# Patient Record
Sex: Female | Born: 1945 | Race: White | Hispanic: No | Smoking: Current every day smoker
Health system: Southern US, Community
[De-identification: ages and names within clinical notes are randomized; demographics above are authoritative.]

## PROBLEM LIST (undated history)

## (undated) DIAGNOSIS — K219 Gastro-esophageal reflux disease without esophagitis: Secondary | ICD-10-CM

## (undated) DIAGNOSIS — C069 Malignant neoplasm of mouth, unspecified: Secondary | ICD-10-CM

## (undated) DIAGNOSIS — J189 Pneumonia, unspecified organism: Secondary | ICD-10-CM

## (undated) DIAGNOSIS — I729 Aneurysm of unspecified site: Secondary | ICD-10-CM

## (undated) DIAGNOSIS — Z8719 Personal history of other diseases of the digestive system: Secondary | ICD-10-CM

## (undated) DIAGNOSIS — I739 Peripheral vascular disease, unspecified: Secondary | ICD-10-CM

## (undated) DIAGNOSIS — I771 Stricture of artery: Secondary | ICD-10-CM

## (undated) DIAGNOSIS — D649 Anemia, unspecified: Secondary | ICD-10-CM

## (undated) DIAGNOSIS — H3530 Unspecified macular degeneration: Secondary | ICD-10-CM

## (undated) DIAGNOSIS — I774 Celiac artery compression syndrome: Secondary | ICD-10-CM

## (undated) DIAGNOSIS — G629 Polyneuropathy, unspecified: Secondary | ICD-10-CM

## (undated) DIAGNOSIS — M1990 Unspecified osteoarthritis, unspecified site: Secondary | ICD-10-CM

## (undated) DIAGNOSIS — I6529 Occlusion and stenosis of unspecified carotid artery: Secondary | ICD-10-CM

## (undated) DIAGNOSIS — J45909 Unspecified asthma, uncomplicated: Secondary | ICD-10-CM

## (undated) DIAGNOSIS — G4730 Sleep apnea, unspecified: Secondary | ICD-10-CM

## (undated) DIAGNOSIS — L570 Actinic keratosis: Secondary | ICD-10-CM

## (undated) DIAGNOSIS — F32A Depression, unspecified: Secondary | ICD-10-CM

## (undated) HISTORY — DX: Peripheral vascular disease, unspecified: I73.9

## (undated) HISTORY — DX: Unspecified asthma, uncomplicated: J45.909

## (undated) HISTORY — PX: COLONOSCOPY: SHX174

## (undated) HISTORY — DX: Actinic keratosis: L57.0

## (undated) HISTORY — DX: Sleep apnea, unspecified: G47.30

## (undated) HISTORY — DX: Unspecified macular degeneration: H35.30

## (undated) HISTORY — DX: Occlusion and stenosis of unspecified carotid artery: I65.29

## (undated) HISTORY — PX: CELIAC ARTERY STENT: SHX1319

## (undated) HISTORY — PX: APPENDECTOMY: SHX54

---

## 2001-11-09 HISTORY — PX: PAROTID GLAND TUMOR EXCISION: SHX5221

## 2015-11-10 HISTORY — PX: EYE SURGERY: SHX253

## 2015-11-12 ENCOUNTER — Other Ambulatory Visit: Payer: Self-pay | Admitting: Internal Medicine

## 2015-11-12 DIAGNOSIS — E2839 Other primary ovarian failure: Secondary | ICD-10-CM

## 2015-11-12 DIAGNOSIS — Z780 Asymptomatic menopausal state: Secondary | ICD-10-CM

## 2015-12-17 ENCOUNTER — Other Ambulatory Visit: Payer: Self-pay | Admitting: Internal Medicine

## 2015-12-17 DIAGNOSIS — Z1231 Encounter for screening mammogram for malignant neoplasm of breast: Secondary | ICD-10-CM

## 2016-01-23 ENCOUNTER — Ambulatory Visit
Admission: RE | Admit: 2016-01-23 | Discharge: 2016-01-23 | Disposition: A | Discharge status: Home / Self Care | Payer: Medicare Other | Source: Ambulatory Visit | Attending: Internal Medicine | Admitting: Internal Medicine

## 2016-01-23 ENCOUNTER — Ambulatory Visit: Payer: Self-pay

## 2016-01-23 DIAGNOSIS — E2839 Other primary ovarian failure: Secondary | ICD-10-CM

## 2016-01-23 DIAGNOSIS — Z1231 Encounter for screening mammogram for malignant neoplasm of breast: Secondary | ICD-10-CM

## 2016-08-10 ENCOUNTER — Other Ambulatory Visit: Payer: Self-pay | Admitting: Internal Medicine

## 2016-08-10 DIAGNOSIS — N6310 Unspecified lump in the right breast, unspecified quadrant: Secondary | ICD-10-CM

## 2016-08-10 DIAGNOSIS — N63 Unspecified lump in unspecified breast: Secondary | ICD-10-CM

## 2016-08-12 ENCOUNTER — Ambulatory Visit
Admission: RE | Admit: 2016-08-12 | Discharge: 2016-08-12 | Disposition: A | Discharge status: Home / Self Care | Payer: Medicare Other | Source: Ambulatory Visit | Attending: Internal Medicine | Admitting: Internal Medicine

## 2016-08-12 DIAGNOSIS — N6310 Unspecified lump in the right breast, unspecified quadrant: Secondary | ICD-10-CM

## 2016-10-09 HISTORY — PX: CHOLECYSTECTOMY: SHX55

## 2016-10-09 HISTORY — PX: APPENDECTOMY: SHX54

## 2017-06-07 ENCOUNTER — Other Ambulatory Visit: Payer: Self-pay | Admitting: Internal Medicine

## 2017-06-07 DIAGNOSIS — Z1231 Encounter for screening mammogram for malignant neoplasm of breast: Secondary | ICD-10-CM

## 2017-06-28 ENCOUNTER — Ambulatory Visit
Admission: RE | Admit: 2017-06-28 | Discharge: 2017-06-28 | Disposition: A | Discharge status: Home / Self Care | Payer: Medicare Other | Source: Ambulatory Visit | Attending: Internal Medicine | Admitting: Internal Medicine

## 2017-06-28 DIAGNOSIS — Z1231 Encounter for screening mammogram for malignant neoplasm of breast: Secondary | ICD-10-CM

## 2017-09-14 ENCOUNTER — Ambulatory Visit (INDEPENDENT_AMBULATORY_CARE_PROVIDER_SITE_OTHER): Payer: Self-pay | Admitting: Orthopedic Surgery

## 2017-09-14 ENCOUNTER — Telehealth (INDEPENDENT_AMBULATORY_CARE_PROVIDER_SITE_OTHER): Payer: Self-pay

## 2017-09-14 ENCOUNTER — Ambulatory Visit (INDEPENDENT_AMBULATORY_CARE_PROVIDER_SITE_OTHER): Payer: Medicare Other

## 2017-09-14 ENCOUNTER — Other Ambulatory Visit (INDEPENDENT_AMBULATORY_CARE_PROVIDER_SITE_OTHER): Payer: Self-pay

## 2017-09-14 ENCOUNTER — Ambulatory Visit (INDEPENDENT_AMBULATORY_CARE_PROVIDER_SITE_OTHER): Payer: Medicare Other | Admitting: Orthopedic Surgery

## 2017-09-14 ENCOUNTER — Encounter (INDEPENDENT_AMBULATORY_CARE_PROVIDER_SITE_OTHER): Payer: Self-pay | Admitting: Orthopedic Surgery

## 2017-09-14 DIAGNOSIS — M25562 Pain in left knee: Secondary | ICD-10-CM | POA: Diagnosis not present

## 2017-09-14 DIAGNOSIS — G8929 Other chronic pain: Secondary | ICD-10-CM

## 2017-09-14 DIAGNOSIS — M25572 Pain in left ankle and joints of left foot: Secondary | ICD-10-CM

## 2017-09-14 DIAGNOSIS — M545 Low back pain, unspecified: Secondary | ICD-10-CM

## 2017-09-14 DIAGNOSIS — M4807 Spinal stenosis, lumbosacral region: Secondary | ICD-10-CM

## 2017-09-14 MED ORDER — DIAZEPAM 5 MG PO TABS: ORAL_TABLET | ORAL | 0 refills | Status: DC

## 2017-09-14 NOTE — Telephone Encounter (Signed)
Patient wanted OPEN MRI, sorry I didn't state that in the order, she is claustro, I called her in some Valium too

## 2017-09-14 NOTE — Progress Notes (Signed)
Office Visit Note   Patient: Alicia [name]           Date of Birth: 04-19-1946           MRN: [id number] Visit Date: 09/14/2017 Requested by: No referring provider defined for this encounter. PCP: Doretha Sou, MD  Subjective: Chief Complaint  Patient presents with  . Left Knee - Pain  . Lower Back - Pain  . Left Ankle - Pain    HPI: Alicia [name] is a 71 year old patient with multiple orthopedic complaints today.  She has a long and complicated past medical and surgical history.  She was noted to have previous left navicular fracture years ago treated in [place] without surgery.  She reports ambulatory left ankle and foot pain at this time which is not unexpected.  She takes [facility] and Tylenol for her symptoms.  Patient also describes back pain.  She's had back pain for a long time.  Had lumbar spine MRI which showed stenosis and scoliosis in 2013.  She had some type of denervation procedure which did not help.  She states currently she has back pain which runs down the left greater than right leg when she is sitting.  She does use a cane.  Walking endurance is 20 minutes and stair standing endurance is 10 minutes.  This is affecting her sleep.  Patient does physical therapy at home.  She reports left knee pain and had an injection in the left knee 8 years ago.  Denies any history of injury mechanical symptoms locking or instability in the left knee              ROS: All systems reviewed are negative as they relate to the chief complaint within the history of present illness.  Patient denies  fevers or chills.   Assessment & Plan: Visit Diagnoses:  1. Chronic pain of left knee   2. Lumbar pain   3. Pain in left ankle and joints of left foot     Plan: Impression is severe scoliosis and degenerative disc disease in the lumbar spine.  This is her biggest issue.  I don't think that she is really up for any type of major surgical intervention.  She is doing physical  therapy and taking medication.  No real room for improvement there.  In regards to injections I think it would be reasonable to consider having Dr. Ernestina Patches evaluate her and inject her back without doing the denervation procedure.  If not that she just will simply have to live with what she has.  To that and I have requested open MRI scan of the lumbar spine with follow-up with Dr. Ernestina Patches.  In regards to the left ankle this is something she can either live with her consider fusion 4.  I would refer to Dr. Sharol Given for foot fusion if necessary.  In regards to the left knee radiographs and exam are normal.  No intervention required on that at this time  Follow-Up Instructions: No Follow-up on file.   Orders:  Orders Placed This Encounter  Procedures  . XR Knee 1-2 Views Left  . XR Ankle Complete Left  . XR Lumbar Spine 2-3 Views   No orders of the defined types were placed in this encounter.     Procedures: No procedures performed   Clinical Data: No additional findings.  Objective: Vital Signs: There were no vitals taken for this visit.  Physical Exam:   Constitutional: Patient appears well-developed HEENT:  Head: Normocephalic Eyes:EOM  are normal Neck: Normal range of motion Cardiovascular: Normal rate Pulmonary/chest: Effort normal Neurologic: Patient is alert Skin: Skin is warm Psychiatric: Patient has normal mood and affect    Ortho Exam: Orthopedic exam demonstrates left knee full range of motion stable collateral crucial in is no effusion no groin pain with internal/external rotation of the leg.  Left ankle exam demonstrates palpable pedal pulses some pain tenderness and deformity along the midfoot region just distal to the joint line.  Posterior tib and 2 tib peroneal and Achilles tendons palpable nontender and intact.  There is diminished subtalar and transverse tarsal range of motion.  Tibiotalar range of motion intact but somewhat limited with ankle dorsiflexion due to  the bony deformity.  Patient has no nerve retention signs no groin pain with internal rotation leg palpable pedal pulses symmetric reflexes negative in sitting negative clonus no definite paresthesias L1 S1 bilaterally some pain with forward lateral bending and general stiffness in the lumbar spine.  Specialty Comments:  No specialty comments available.  Imaging: Xr Ankle Complete Left  Result Date: 09/14/2017 AP lateral mortise left ankle reviewed.  Ankle mortise is symmetric but severe and deforming talonavicular arthritis is present.  No acute fracture noted.  No other fractures present.  Xr Knee 1-2 Views Left  Result Date: 09/14/2017 AP lateral left knee reviewed.  No arthritis is present in the patellofemoral needle or lateral joint spaces.  No fracture dislocation or effusion present.  Normal left knee  Xr Lumbar Spine 2-3 Views  Result Date: 09/14/2017 AP lateral lumbar spine reviewed significant scoliosis is present.  Significant degenerative disc disease at multiple levels including L4-5 and L5-S1 is present.  Spurring is noted on the lateral aspect of the vertebral bodies.  Facet arthritis is present.  Hip joints appear normal.  Sacroiliac joints also appear normal.    PMFS History: There are no active problems to display for this patient.  No past medical history on file.  No family history on file.  No past surgical history on file. Social History   Occupational History  . Not on file  Tobacco Use  . Smoking status: Current Every Day Smoker  . Smokeless tobacco: Never Used  Substance and Sexual Activity  . Alcohol use: Not on file  . Drug use: Not on file  . Sexual activity: Not on file

## 2017-09-14 NOTE — Telephone Encounter (Signed)
I have put in an order for patient to have MRI on her back, Dr. Marlou Sa wanted her to followup with [facility] after this MRI Wasn't 100% sure how to go about this

## 2017-09-15 NOTE — Telephone Encounter (Signed)
Noted and I put in referral notes as a reminder

## 2017-09-20 NOTE — Telephone Encounter (Signed)
Please advise. Looks like MRI is scheduled for 11/27. Should I schedule for an OV after that or schedule for injection pending MRI results? No blood thinners in chart; Medicare.

## 2017-09-20 NOTE — Telephone Encounter (Signed)
OV

## 2017-09-23 NOTE — Telephone Encounter (Signed)
Scheduled for an ov 10/06/17.

## 2017-10-05 ENCOUNTER — Ambulatory Visit
Admission: RE | Admit: 2017-10-05 | Discharge: 2017-10-05 | Disposition: A | Discharge status: Home / Self Care | Payer: Medicare Other | Source: Ambulatory Visit | Attending: Orthopedic Surgery | Admitting: Orthopedic Surgery

## 2017-10-05 DIAGNOSIS — M4807 Spinal stenosis, lumbosacral region: Secondary | ICD-10-CM

## 2017-10-06 ENCOUNTER — Encounter (INDEPENDENT_AMBULATORY_CARE_PROVIDER_SITE_OTHER): Payer: Self-pay | Admitting: Physical Medicine and Rehabilitation

## 2017-10-06 ENCOUNTER — Ambulatory Visit (INDEPENDENT_AMBULATORY_CARE_PROVIDER_SITE_OTHER): Payer: Medicare Other | Admitting: Physical Medicine and Rehabilitation

## 2017-10-06 VITALS — BP 148/78 | HR 53

## 2017-10-06 DIAGNOSIS — M5416 Radiculopathy, lumbar region: Secondary | ICD-10-CM

## 2017-10-06 DIAGNOSIS — M48062 Spinal stenosis, lumbar region with neurogenic claudication: Secondary | ICD-10-CM | POA: Diagnosis not present

## 2017-10-06 DIAGNOSIS — M5116 Intervertebral disc disorders with radiculopathy, lumbar region: Secondary | ICD-10-CM | POA: Diagnosis not present

## 2017-10-06 DIAGNOSIS — M419 Scoliosis, unspecified: Secondary | ICD-10-CM

## 2017-10-06 NOTE — Progress Notes (Signed)
Alicia [name] - 71 y.o. female MRN [id number]  Date of birth: May 22, 1946  Office Visit Note: Visit Date: 10/06/2017 PCP: Alicia Sou, MD Referred by: Alicia [name]*  Subjective: Chief Complaint  Patient presents with  . Lower Back - Pain  . Left Hip - Pain  . Left Leg - Pain   HPI: [name] is a 71 year old issues from an orthopedic standpoint.  She reports years of chronic back pain.  Most recently she has had worsening left hip and leg pain particularly with sitting and standing.  She reports this goes down to the posterior lateral leg and the ankle area.  She is not noted any focal weakness or foot drop.  She said no specific trauma.  She has been followed by Dr. Marlou Sa in the office who did order an MRI of her lumbar spine.  This is reviewed below but basically shows mild scoliosis with significant facet arthropathy particularly on the left at L4-5 creating some lateral recess narrowing along with a disc protrusion and broad-based bulge.  This could affect the L5 nerve root in the lateral recess or in the foramen.  She reports no real right-sided leg pain.  No groin pain.  She has had medications and physical therapy in the past.  She was treated by a pain management physician of some sort in [facility] and she says an attempted procedure was done to get rid of the nerves in the lower back.  She reports that this is the most painful injection or procedure she is ever had and she will never go through that again.  When asked about some of the injections or other treatments provided she is really unsure what the physician did.  We unfortunately do not have his notes for review.  He is currently taking an anti-inflammatory without relief.  She is able to walk prolonged sitting as well.    Review of Systems  Constitutional: Negative for chills, fever, malaise/fatigue and weight loss.  HENT: Negative for hearing loss and sinus pain.   Eyes:  Negative for blurred vision, double vision and photophobia.  Respiratory: Negative for cough and shortness of breath.   Cardiovascular: Negative for chest pain, palpitations and leg swelling.  Gastrointestinal: Negative for abdominal pain, nausea and vomiting.  Genitourinary: Negative for flank pain.  Musculoskeletal: Positive for back pain and joint pain. Negative for myalgias.  Skin: Negative for itching and rash.  Neurological: Negative for tremors, focal weakness and weakness.  Endo/Heme/Allergies: Negative.   Psychiatric/Behavioral: Negative for depression.  All other systems reviewed and are negative.  Otherwise per HPI.  Assessment & Plan: Visit Diagnoses: No diagnosis found.  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  She has sort of mixed symptoms that could be disc related nerve it sounds like the attempt at radiofrequency ablation was obviously unsuccessful and caused her a lot of stress and how painful it was.  I did try to explain to her that we do that same procedure in the office there could be parts of it that are touchy but usually the patients do fairly well.  The best approach though since she is having radicular pain is to try an epidural injection.  She is not sure that she had an epidural at all with the other physician in [place].  She is  not on any anticoagulation.  I would start with an L4-5 interlaminar injection.  Would look at facet joint block in the future.    Meds & Orders: No orders of the defined types were placed in this encounter.  No orders of the defined types were placed in this encounter.   Follow-up: Return for Left L4-5 interlaminar epidural steroid injection.   Procedures: No procedures performed  No notes on file   Clinical History: MRI LUMBAR  SPINE WITHOUT CONTRAST  TECHNIQUE: Multiplanar, multisequence MR imaging of the lumbar spine was performed. No intravenous contrast was administered.  COMPARISON:  09/14/2017 lumbar spine radiographs.  FINDINGS: Segmentation:  Standard.  Alignment: Mild lumbar levocurvature with apex at L3. Straightening of lumbar lordosis. No listhesis.  Vertebrae: Mild multilevel endplate degenerative edema at the L2-3 through L5-S1 levels. No evidence of discitis or fracture. 13 mm intermediate signal focus in L3 vertebral body. Subcentimeter T1/T2 hyperintense focus in L1 vertebral body, likely hemangioma.  Conus medullaris and cauda equina: Conus extends to the L1 level. Conus and cauda equina appear normal.  Paraspinal and other soft tissues: Negative.  Disc levels:  L1-2: Minimal disc bulge. No significant foraminal or canal stenosis.  L2-3: Diffuse disc bulge, endplate marginal osteophytes, and right greater than left facet/ligamentum flavum hypertrophy. Moderate right and mild left foraminal stenosis. No significant canal stenosis.  L3-4: Diffuse disc bulge, small central annular fissure, moderate bilateral facet and mild ligamentum flavum hypertrophy. Mild bilateral foraminal stenosis. No significant canal stenosis.  L4-5: Disc bulge eccentric to the left with left foraminal and extraforaminal prominent marginal osteophytes, mild right/ severe left facet hypertrophy, and left ligamentum flavum hypertrophy. Severe left foraminal stenosis. Left lateral recess narrowing with disc contact on descending left L5 nerve root.  L5-S1: Small disc bulge, bilateral foraminal and extraforaminal marginal osteophytes, and advanced facet hypertrophy. Severe bilateral foraminal stenosis. No significant canal stenosis.  IMPRESSION: 1. No acute osseous abnormality.  Mild lumbar levocurvature. 2. Indeterminate 13 mm L3 vertebral body intermediate signal focus. In the absence of  primary malignancy this probably represents red marrow. If there is clinical concern for metastatic disease, consider lumbar spine MRI with contrast or bone scan for further assessment. 3. No significant canal stenosis. 4. Multilevel mild and moderate foraminal stenosis. Severe left L4-5 and bilateral L5-S1 foraminal stenosis.   Electronically Signed   By: Kristine Garbe M.D.   On: 10/05/2017 13:51  She reports that she has been smoking.  she has never used smokeless tobacco. No results for input(s): HGBA1C, LABURIC in the last 8760 hours.  Objective:  VS:  HT:    WT:   BMI:     BP:(!) 148/78  HR:(!) 53bpm  TEMP: ( )  RESP:  Physical Exam  Constitutional: She is oriented to person, place, and time. She appears well-developed and well-nourished.  Eyes: Conjunctivae and EOM are normal. Pupils are equal, round, and reactive to light.  Cardiovascular: Normal rate and intact distal pulses.  Pulmonary/Chest: Effort normal.  Musculoskeletal:  Patient is slow to rise from a seated position and does have concordant low back pain with extension rotation to the left.  She has no pain over the greater trochanters and no pain with hip rotation.  She has good distal strength without clonus.  She has a negative slump test.  Neurological: She is alert and oriented to person, place, and time. She exhibits normal muscle tone.  Skin: Skin is warm and dry. No rash noted. No erythema.  Psychiatric: She has  a normal mood and affect. Her behavior is normal.  Nursing note and vitals reviewed.   Ortho Exam Imaging: No results found.  Past Medical/Family/Surgical/Social History: Medications & Allergies reviewed per EMR There are no active problems to display for this patient.  History reviewed. No pertinent past medical history. History reviewed. No pertinent family history. History reviewed. No pertinent surgical history. Social History   Occupational History  . Not on file    Tobacco Use  . Smoking status: Current Every Day Smoker  . Smokeless tobacco: Never Used  Substance and Sexual Activity  . Alcohol use: Not on file  . Drug use: Not on file  . Sexual activity: Not on file

## 2017-10-06 NOTE — Progress Notes (Deleted)
Lower back pain. Had denervation in 2013. She said this did not work and she will never have that again because it hurt so much. Pain is on both sides- starts at waist, and radiates down to back of knee- left leg is worse.

## 2017-10-14 ENCOUNTER — Encounter (INDEPENDENT_AMBULATORY_CARE_PROVIDER_SITE_OTHER): Payer: Self-pay | Admitting: Physical Medicine and Rehabilitation

## 2017-10-18 ENCOUNTER — Encounter (INDEPENDENT_AMBULATORY_CARE_PROVIDER_SITE_OTHER): Payer: Medicare Other | Admitting: Physical Medicine and Rehabilitation

## 2017-10-26 ENCOUNTER — Ambulatory Visit (INDEPENDENT_AMBULATORY_CARE_PROVIDER_SITE_OTHER): Payer: Medicare Other | Admitting: Physical Medicine and Rehabilitation

## 2017-10-26 ENCOUNTER — Encounter (INDEPENDENT_AMBULATORY_CARE_PROVIDER_SITE_OTHER): Payer: Self-pay | Admitting: Physical Medicine and Rehabilitation

## 2017-10-26 ENCOUNTER — Ambulatory Visit (INDEPENDENT_AMBULATORY_CARE_PROVIDER_SITE_OTHER): Payer: Medicare Other

## 2017-10-26 VITALS — BP 139/76 | HR 52 | Temp 98.2°F

## 2017-10-26 DIAGNOSIS — M5116 Intervertebral disc disorders with radiculopathy, lumbar region: Secondary | ICD-10-CM

## 2017-10-26 DIAGNOSIS — M48062 Spinal stenosis, lumbar region with neurogenic claudication: Secondary | ICD-10-CM | POA: Diagnosis not present

## 2017-10-26 DIAGNOSIS — M5416 Radiculopathy, lumbar region: Secondary | ICD-10-CM

## 2017-10-26 MED ORDER — METHYLPREDNISOLONE ACETATE 80 MG/ML IJ SUSP
80.0000 mg | Freq: Once | INTRAMUSCULAR | Status: AC
  Administered 2017-10-26: 80 mg

## 2017-10-26 MED ORDER — LIDOCAINE HCL (PF) 1 % IJ SOLN
2.0000 mL | Freq: Once | INTRAMUSCULAR | Status: AC
  Administered 2017-10-26: 2 mL

## 2017-10-26 NOTE — Patient Instructions (Signed)

## 2017-11-10 ENCOUNTER — Telehealth (INDEPENDENT_AMBULATORY_CARE_PROVIDER_SITE_OTHER): Payer: Self-pay | Admitting: Physical Medicine and Rehabilitation

## 2017-11-10 NOTE — Procedures (Signed)
[name] is a very pleasant but anxious 72 year old female comes in today for planned L4-5 interlaminar epidural steroid injection for moderate stenosis in the low back and hip pain.  Please see our prior evaluation and management note for further details and justification.  Lumbar Epidural Steroid Injection - Interlaminar Approach with Fluoroscopic Guidance  Patient: Alicia [name]      Date of Birth: [birth year] MRN: [id number] PCP: Doretha Sou, MD      Visit Date: 10/26/2017   Universal Protocol:     Consent Given By: the patient  Position: PRONE  Additional Comments: Vital signs were monitored before and after the procedure. Patient was prepped and draped in the usual sterile fashion. The correct patient, procedure, and site was verified.   Injection Procedure Details:  Procedure Site One Meds Administered:  Meds ordered this encounter  Medications  . lidocaine (PF) (XYLOCAINE) 1 % injection 2 mL  . methylPREDNISolone acetate (DEPO-MEDROL) injection 80 mg     Laterality: Bilateral  Location/Site:  L4-L5  Needle size: 20 G  Needle type: Tuohy  Needle Placement: Paramedian epidural  Findings:   -Comments: Excellent flow of contrast into the epidural space.  Procedure Details: Using a paramedian approach from the side mentioned above, the region overlying the inferior lamina was localized under fluoroscopic visualization and the soft tissues overlying this structure were infiltrated with 4 ml. of 1% Lidocaine without Epinephrine. The Tuohy needle was inserted into the epidural space using a paramedian approach.   The epidural space was localized using loss of resistance along with lateral and bi-planar fluoroscopic views.  After negative aspirate for air, blood, and CSF, a 2 ml. volume of Isovue-250 was injected into the epidural space and the flow of contrast was observed. Radiographs were obtained for documentation purposes.    The  injectate was administered into the level noted above.   Additional Comments:  The patient tolerated the procedure well Dressing: Band-Aid    Post-procedure details: Patient was observed during the procedure. Post-procedure instructions were reviewed.  Patient left the clinic in stable condition.  Pertinent Imaging: MRI LUMBAR SPINE WITHOUT CONTRAST  TECHNIQUE: Multiplanar, multisequence MR imaging of the lumbar spine was performed. No intravenous contrast was administered.  COMPARISON:  09/14/2017 lumbar spine radiographs.  FINDINGS: Segmentation:  Standard.  Alignment: Mild lumbar levocurvature with apex at L3. Straightening of lumbar lordosis. No listhesis.  Vertebrae: Mild multilevel endplate degenerative edema at the L2-3 through L5-S1 levels. No evidence of discitis or fracture. 13 mm intermediate signal focus in L3 vertebral body. Subcentimeter T1/T2 hyperintense focus in L1 vertebral body, likely hemangioma.  Conus medullaris and cauda equina: Conus extends to the L1 level. Conus and cauda equina appear normal.  Paraspinal and other soft tissues: Negative.  Disc levels:  L1-2: Minimal disc bulge. No significant foraminal or canal stenosis.  L2-3: Diffuse disc bulge, endplate marginal osteophytes, and right greater than left facet/ligamentum flavum hypertrophy. Moderate right and mild left foraminal stenosis. No significant canal stenosis.  L3-4: Diffuse disc bulge, small central annular fissure, moderate bilateral facet and mild ligamentum flavum hypertrophy. Mild bilateral foraminal stenosis. No significant canal stenosis.  L4-5: Disc bulge eccentric to the left with left foraminal and extraforaminal prominent marginal osteophytes, mild right/ severe left facet hypertrophy, and left ligamentum flavum hypertrophy. Severe left foraminal stenosis. Left lateral recess narrowing with disc contact on descending left L5 nerve root.  L5-S1:  Small disc bulge, bilateral foraminal and extraforaminal marginal osteophytes, and advanced facet hypertrophy. Severe bilateral foraminal  stenosis. No significant canal stenosis.  IMPRESSION: 1. No acute osseous abnormality.  Mild lumbar levocurvature. 2. Indeterminate 13 mm L3 vertebral body intermediate signal focus. In the absence of primary malignancy this probably represents red marrow. If there is clinical concern for metastatic disease, consider lumbar spine MRI with contrast or bone scan for further assessment. 3. No significant canal stenosis. 4. Multilevel mild and moderate foraminal stenosis. Severe left L4-5 and bilateral L5-S1 foraminal stenosis.   Electronically Signed   By: Kristine Garbe M.D.   On: 10/05/2017 13:51

## 2017-11-10 NOTE — Telephone Encounter (Signed)
Left message for patient to call back to schedule.  °

## 2017-11-10 NOTE — Telephone Encounter (Signed)
Scheduled for 1/14. Would like Valium prior. [facility] [address].

## 2017-11-10 NOTE — Telephone Encounter (Signed)
Would be ok  

## 2017-11-11 ENCOUNTER — Other Ambulatory Visit (INDEPENDENT_AMBULATORY_CARE_PROVIDER_SITE_OTHER): Payer: Self-pay | Admitting: Physical Medicine and Rehabilitation

## 2017-11-11 MED ORDER — DIAZEPAM 5 MG PO TABS: ORAL_TABLET | ORAL | 0 refills | Status: DC

## 2017-11-11 NOTE — Telephone Encounter (Signed)
printed

## 2017-11-12 ENCOUNTER — Other Ambulatory Visit (INDEPENDENT_AMBULATORY_CARE_PROVIDER_SITE_OTHER): Payer: Self-pay | Admitting: Physical Medicine and Rehabilitation

## 2017-11-12 MED ORDER — DIAZEPAM 5 MG PO TABS: ORAL_TABLET | ORAL | 0 refills | Status: DC

## 2017-11-12 NOTE — Progress Notes (Signed)
Order did not print yesterday

## 2017-11-12 NOTE — Telephone Encounter (Signed)
Faxed

## 2017-11-22 ENCOUNTER — Encounter (INDEPENDENT_AMBULATORY_CARE_PROVIDER_SITE_OTHER): Payer: Medicare Other | Admitting: Physical Medicine and Rehabilitation

## 2017-11-30 ENCOUNTER — Ambulatory Visit (INDEPENDENT_AMBULATORY_CARE_PROVIDER_SITE_OTHER): Payer: Self-pay

## 2017-11-30 ENCOUNTER — Ambulatory Visit (INDEPENDENT_AMBULATORY_CARE_PROVIDER_SITE_OTHER): Payer: Medicare Other | Admitting: Physical Medicine and Rehabilitation

## 2017-11-30 ENCOUNTER — Encounter (INDEPENDENT_AMBULATORY_CARE_PROVIDER_SITE_OTHER): Payer: Self-pay | Admitting: Physical Medicine and Rehabilitation

## 2017-11-30 VITALS — BP 130/69 | HR 68 | Temp 98.3°F

## 2017-11-30 DIAGNOSIS — M5116 Intervertebral disc disorders with radiculopathy, lumbar region: Secondary | ICD-10-CM

## 2017-11-30 DIAGNOSIS — M48062 Spinal stenosis, lumbar region with neurogenic claudication: Secondary | ICD-10-CM

## 2017-11-30 DIAGNOSIS — M5416 Radiculopathy, lumbar region: Secondary | ICD-10-CM

## 2017-11-30 MED ORDER — BETAMETHASONE SOD PHOS & ACET 6 (3-3) MG/ML IJ SUSP
12.0000 mg | Freq: Once | INTRAMUSCULAR | Status: AC
  Administered 2017-11-30: 12 mg

## 2017-11-30 NOTE — Progress Notes (Deleted)
Pt states pain in lower back that radiates down to both right and left legs, pt states some numbness in both right and left feet. Pt states pain has been going on for a few years. Pt states normal activities make pain better, going to bed eases pain. Pt states last injection on 10/26/17 helped, but only lasted for 4 days. +Driver, -BT, -Dye Allergies.

## 2017-11-30 NOTE — Patient Instructions (Signed)

## 2017-12-06 NOTE — Procedures (Signed)
Alicia [name] is a 72 year old hips and legs which some paresthesias.  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  Prior injection on 10/26/2017 which was a left L4-5 interlaminar.  Great relief but it only lasted for a few days.  I think one time is worth repeating this injection because diagnostically it did help more than 50%.  Depending on relief would look at possible medial branch blocks although she has some history of having a hard time with these in a prior office.  Nonetheless we will do the injection today and see how she does.  Lumbar Epidural Steroid Injection - Interlaminar Approach with Fluoroscopic Guidance  Patient: Alicia [name]      Date of Birth: 1946-06-17 MRN: [id number] PCP: Doretha Sou, MD      Visit Date: 11/30/2017   Universal Protocol:     Consent Given By: the patient  Position: PRONE  Additional Comments: Vital signs were monitored before and after the procedure. Patient was prepped and draped in the usual sterile fashion. The correct patient, procedure, and site was verified.   Injection Procedure Details:  Procedure Site One Meds Administered:  Meds ordered this encounter  Medications  . betamethasone acetate-betamethasone sodium phosphate (CELESTONE) injection 12 mg     Laterality: Right  Location/Site:  L4-L5  Needle size: 20 G  Needle type: Tuohy  Needle Placement: Paramedian epidural  Findings:   -Comments: Excellent flow of contrast into the epidural space.  Procedure Details: Using a paramedian approach from the side mentioned above, the region overlying the inferior lamina was localized under fluoroscopic visualization and the soft tissues overlying this structure were infiltrated with 4 ml. of 1% Lidocaine without Epinephrine. The Tuohy needle was inserted into the epidural space using a paramedian approach.   The epidural space was localized using loss of  resistance along with lateral and bi-planar fluoroscopic views.  After negative aspirate for air, blood, and CSF, a 2 ml. volume of Isovue-250 was injected into the epidural space and the flow of contrast was observed. Radiographs were obtained for documentation purposes.    The injectate was administered into the level noted above.   Additional Comments:  The patient tolerated the procedure well Dressing: Band-Aid    Post-procedure details: Patient was observed during the procedure. Post-procedure instructions were reviewed.  Patient left the clinic in stable condition.  Pertinent Imaging: MRI LUMBAR SPINE WITHOUT CONTRAST  TECHNIQUE: Multiplanar, multisequence MR imaging of the lumbar spine was performed. No intravenous contrast was administered.  COMPARISON:  09/14/2017 lumbar spine radiographs.  FINDINGS: Segmentation:  Standard.  Alignment: Mild lumbar levocurvature with apex at L3. Straightening of lumbar lordosis. No listhesis.  Vertebrae: Mild multilevel endplate degenerative edema at the L2-3 through L5-S1 levels. No evidence of discitis or fracture. 13 mm intermediate signal focus in L3 vertebral body. Subcentimeter T1/T2 hyperintense focus in L1 vertebral body, likely hemangioma.  Conus medullaris and cauda equina: Conus extends to the L1 level. Conus and cauda equina appear normal.  Paraspinal and other soft tissues: Negative.  Disc levels:  L1-2: Minimal disc bulge. No significant foraminal or canal stenosis.  L2-3: Diffuse disc bulge, endplate marginal osteophytes, and right greater than left facet/ligamentum flavum hypertrophy. Moderate right and mild left foraminal stenosis. No significant canal stenosis.  L3-4: Diffuse disc bulge, small central annular fissure, moderate bilateral facet and mild ligamentum flavum hypertrophy. Mild bilateral foraminal stenosis. No significant canal stenosis.  L4-5: Disc bulge eccentric to the left with  left foraminal and extraforaminal prominent marginal osteophytes, mild right/ severe left facet hypertrophy, and left ligamentum flavum hypertrophy. Severe left foraminal stenosis. Left lateral recess narrowing with disc contact on descending left L5 nerve root.  L5-S1: Small disc bulge, bilateral foraminal and extraforaminal marginal osteophytes, and advanced facet hypertrophy. Severe bilateral foraminal stenosis. No significant canal stenosis.  IMPRESSION: 1. No acute osseous abnormality.  Mild lumbar levocurvature. 2. Indeterminate 13 mm L3 vertebral body intermediate signal focus. In the absence of primary malignancy this probably represents red marrow. If there is clinical concern for metastatic disease, consider lumbar spine MRI with contrast or bone scan for further assessment. 3. No significant canal stenosis. 4. Multilevel mild and moderate foraminal stenosis. Severe left L4-5 and bilateral L5-S1 foraminal stenosis.   Electronically Signed   By: Kristine Garbe M.D.   On: 10/05/2017 13:51

## 2018-05-17 ENCOUNTER — Telehealth (INDEPENDENT_AMBULATORY_CARE_PROVIDER_SITE_OTHER): Payer: Self-pay | Admitting: Physical Medicine and Rehabilitation

## 2018-05-17 NOTE — Telephone Encounter (Signed)
Scheduled for 05/26/18 at 0900.

## 2018-05-17 NOTE — Telephone Encounter (Signed)
ov

## 2018-05-26 ENCOUNTER — Telehealth (INDEPENDENT_AMBULATORY_CARE_PROVIDER_SITE_OTHER): Payer: Self-pay | Admitting: *Deleted

## 2018-05-26 ENCOUNTER — Ambulatory Visit (INDEPENDENT_AMBULATORY_CARE_PROVIDER_SITE_OTHER): Payer: Medicare Other | Admitting: Physical Medicine and Rehabilitation

## 2018-05-26 ENCOUNTER — Encounter (INDEPENDENT_AMBULATORY_CARE_PROVIDER_SITE_OTHER): Payer: Self-pay | Admitting: Physical Medicine and Rehabilitation

## 2018-05-26 VITALS — BP 136/72 | HR 75 | Temp 98.0°F | Ht 62.0 in | Wt 134.0 lb

## 2018-05-26 DIAGNOSIS — M47816 Spondylosis without myelopathy or radiculopathy, lumbar region: Secondary | ICD-10-CM | POA: Diagnosis not present

## 2018-05-26 DIAGNOSIS — M545 Low back pain: Secondary | ICD-10-CM

## 2018-05-26 DIAGNOSIS — G8929 Other chronic pain: Secondary | ICD-10-CM

## 2018-05-26 DIAGNOSIS — G894 Chronic pain syndrome: Secondary | ICD-10-CM | POA: Diagnosis not present

## 2018-05-26 DIAGNOSIS — M5136 Other intervertebral disc degeneration, lumbar region: Secondary | ICD-10-CM | POA: Diagnosis not present

## 2018-05-26 NOTE — Progress Notes (Signed)
 .  Numeric Pain Rating Scale and Functional Assessment Average Pain 9 Pain Right Now 5 My pain is constant, dull and aching Pain is worse with: walking, bending, standing and some activites Pain improves with: rest and medication   In the last MONTH (on 0-10 scale) has pain interfered with the following?  1. General activity like being  able to carry out your everyday physical activities such as walking, climbing stairs, carrying groceries, or moving a chair?  Rating(7)  2. Relation with others like being able to carry out your usual social activities and roles such as  activities at home, at work and in your community. Rating(3)  3. Enjoyment of life such that you have  been bothered by emotional problems such as feeling anxious, depressed or irritable?  Rating(3)

## 2018-05-27 NOTE — Telephone Encounter (Signed)
error 

## 2018-06-06 ENCOUNTER — Encounter (INDEPENDENT_AMBULATORY_CARE_PROVIDER_SITE_OTHER): Payer: Self-pay | Admitting: Physical Medicine and Rehabilitation

## 2018-06-06 ENCOUNTER — Ambulatory Visit (INDEPENDENT_AMBULATORY_CARE_PROVIDER_SITE_OTHER): Payer: Self-pay

## 2018-06-06 ENCOUNTER — Ambulatory Visit (INDEPENDENT_AMBULATORY_CARE_PROVIDER_SITE_OTHER): Payer: Medicare Other | Admitting: Physical Medicine and Rehabilitation

## 2018-06-06 VITALS — BP 135/78 | HR 86

## 2018-06-06 DIAGNOSIS — M5416 Radiculopathy, lumbar region: Secondary | ICD-10-CM | POA: Diagnosis not present

## 2018-06-06 MED ORDER — BETAMETHASONE SOD PHOS & ACET 6 (3-3) MG/ML IJ SUSP
12.0000 mg | Freq: Once | INTRAMUSCULAR | Status: AC
  Administered 2018-06-06: 12 mg

## 2018-06-06 NOTE — Patient Instructions (Signed)

## 2018-06-06 NOTE — Progress Notes (Signed)
.     Numeric Pain Rating Scale and Functional Assessment Average Pain 10   In the last MONTH (on 0-10 scale) has pain interfered with the following?  1. General activity like being  able to carry out your everyday physical activities such as walking, climbing stairs, carrying groceries, or moving a chair?  Rating(7)   +Driver, -BT, -Dye Allergies.  

## 2018-06-07 NOTE — Progress Notes (Signed)
Alicia [name] - 72 y.o. female MRN [id number]  Date of birth: [birth year]  Office Visit Note: Visit Date: 06/06/2018 PCP: Doretha Sou, MD Referred by: Doretha Sou*  Subjective: Chief Complaint  Patient presents with  . Lower Back - Pain  . Left Leg - Pain   HPI: Alicia [name] is a 72 year old female who comes in today for planned left L4 transforaminal epidural steroid injection for 10 out of 10 low back pain radiating to the left hip and leg.  Worse with walking and failing conservative care otherwise.  Prior injection was beneficial.  Please see our prior evaluation and management note for further details and justification.   ROS Otherwise per HPI.  Assessment & Plan: Visit Diagnoses:  1. Lumbar radiculopathy     Plan: No additional findings.   Meds & Orders:  Meds ordered this encounter  Medications  . betamethasone acetate-betamethasone sodium phosphate (CELESTONE) injection 12 mg    Orders Placed This Encounter  Procedures  . XR C-ARM NO REPORT  . Epidural Steroid injection    Follow-up: Return if symptoms worsen or fail to improve.   Procedures: No procedures performed  Lumbosacral Transforaminal Epidural Steroid Injection - Sub-Pedicular Approach with Fluoroscopic Guidance  Patient: Alicia [name]      Date of Birth: [birth year] MRN: [id number] PCP: Doretha Sou, MD      Visit Date: 06/06/2018   Universal Protocol:    Date/Time: 06/06/2018  Consent Given By: the patient  Position: PRONE  Additional Comments: Vital signs were monitored before and after the procedure. Patient was prepped and draped in the usual sterile fashion. The correct patient, procedure, and site was verified.   Injection Procedure Details:  Procedure Site One Meds Administered:  Meds ordered this encounter  Medications  . betamethasone acetate-betamethasone sodium phosphate (CELESTONE) injection 12 mg    Laterality:  Left  Location/Site:  L4-L5  Needle size: 22 G  Needle type: Spinal  Needle Placement: Transforaminal  Findings:    -Comments: Excellent flow of contrast along the nerve and into the epidural space.  Procedure Details: After squaring off the end-plates to get a true AP view, the C-arm was positioned so that an oblique view of the foramen as noted above was visualized. The target area is just inferior to the "nose of the scotty dog" or sub pedicular. The soft tissues overlying this structure were infiltrated with 2-3 ml. of 1% Lidocaine without Epinephrine.  The spinal needle was inserted toward the target using a "trajectory" view along the fluoroscope beam.  Under AP and lateral visualization, the needle was advanced so it did not puncture dura and was located close the 6 O'Clock position of the pedical in AP tracterory. Biplanar projections were used to confirm position. Aspiration was confirmed to be negative for CSF and/or blood. A 1-2 ml. volume of Isovue-250 was injected and flow of contrast was noted at each level. Radiographs were obtained for documentation purposes.   After attaining the desired flow of contrast documented above, a 0.5 to 1.0 ml test dose of 0.25% Marcaine was injected into each respective transforaminal space.  The patient was observed for 90 seconds post injection.  After no sensory deficits were reported, and normal lower extremity motor function was noted,   the above injectate was administered so that equal amounts of the injectate were placed at each foramen (level) into the transforaminal epidural space.   Additional Comments:  The patient tolerated the procedure well Dressing: Band-Aid    Post-procedure  details: Patient was observed during the procedure. Post-procedure instructions were reviewed.  Patient left the clinic in stable condition.    Clinical History: MRI LUMBAR SPINE WITHOUT CONTRAST  TECHNIQUE: Multiplanar, multisequence MR  imaging of the lumbar spine was performed. No intravenous contrast was administered.  COMPARISON:  09/14/2017 lumbar spine radiographs.  FINDINGS: Segmentation:  Standard.  Alignment: Mild lumbar levocurvature with apex at L3. Straightening of lumbar lordosis. No listhesis.  Vertebrae: Mild multilevel endplate degenerative edema at the L2-3 through L5-S1 levels. No evidence of discitis or fracture. 13 mm intermediate signal focus in L3 vertebral body. Subcentimeter T1/T2 hyperintense focus in L1 vertebral body, likely hemangioma.  Conus medullaris and cauda equina: Conus extends to the L1 level. Conus and cauda equina appear normal.  Paraspinal and other soft tissues: Negative.  Disc levels:  L1-2: Minimal disc bulge. No significant foraminal or canal stenosis.  L2-3: Diffuse disc bulge, endplate marginal osteophytes, and right greater than left facet/ligamentum flavum hypertrophy. Moderate right and mild left foraminal stenosis. No significant canal stenosis.  L3-4: Diffuse disc bulge, small central annular fissure, moderate bilateral facet and mild ligamentum flavum hypertrophy. Mild bilateral foraminal stenosis. No significant canal stenosis.  L4-5: Disc bulge eccentric to the left with left foraminal and extraforaminal prominent marginal osteophytes, mild right/ severe left facet hypertrophy, and left ligamentum flavum hypertrophy. Severe left foraminal stenosis. Left lateral recess narrowing with disc contact on descending left L5 nerve root.  L5-S1: Small disc bulge, bilateral foraminal and extraforaminal marginal osteophytes, and advanced facet hypertrophy. Severe bilateral foraminal stenosis. No significant canal stenosis.  IMPRESSION: 1. No acute osseous abnormality.  Mild lumbar levocurvature. 2. Indeterminate 13 mm L3 vertebral body intermediate signal focus. In the absence of primary malignancy this probably represents red marrow. If there is  clinical concern for metastatic disease, consider lumbar spine MRI with contrast or bone scan for further assessment. 3. No significant canal stenosis. 4. Multilevel mild and moderate foraminal stenosis. Severe left L4-5 and bilateral L5-S1 foraminal stenosis.   Electronically Signed   By: Kristine Garbe M.D.   On: 10/05/2017 13:51   She reports that she has been smoking.  She has never used smokeless tobacco. No results for input(s): HGBA1C, LABURIC in the last 8760 hours.  Objective:  VS:  HT:    WT:   BMI:     BP:135/78  HR:86bpm  TEMP: ( )  RESP:  Physical Exam  Ortho Exam Imaging: Xr C-arm No Report  Result Date: 06/06/2018 Please see Notes tab for imaging impression.   Past Medical/Family/Surgical/Social History: Medications & Allergies reviewed per EMR, new medications updated. There are no active problems to display for this patient.  History reviewed. No pertinent past medical history. History reviewed. No pertinent family history. History reviewed. No pertinent surgical history. Social History   Occupational History  . Not on file  Tobacco Use  . Smoking status: Current Every Day Smoker  . Smokeless tobacco: Never Used  Substance and Sexual Activity  . Alcohol use: Not on file  . Drug use: Not on file  . Sexual activity: Not on file

## 2018-06-07 NOTE — Procedures (Signed)
Lumbosacral Transforaminal Epidural Steroid Injection - Sub-Pedicular Approach with Fluoroscopic Guidance  Patient: Alicia [name]      Date of Birth: 1946-04-07 MRN: [id number] PCP: Doretha Sou, MD      Visit Date: 06/06/2018   Universal Protocol:    Date/Time: 06/06/2018  Consent Given By: the patient  Position: PRONE  Additional Comments: Vital signs were monitored before and after the procedure. Patient was prepped and draped in the usual sterile fashion. The correct patient, procedure, and site was verified.   Injection Procedure Details:  Procedure Site One Meds Administered:  Meds ordered this encounter  Medications  . betamethasone acetate-betamethasone sodium phosphate (CELESTONE) injection 12 mg    Laterality: Left  Location/Site:  L4-L5  Needle size: 22 G  Needle type: Spinal  Needle Placement: Transforaminal  Findings:    -Comments: Excellent flow of contrast along the nerve and into the epidural space.  Procedure Details: After squaring off the end-plates to get a true AP view, the C-arm was positioned so that an oblique view of the foramen as noted above was visualized. The target area is just inferior to the "nose of the scotty dog" or sub pedicular. The soft tissues overlying this structure were infiltrated with 2-3 ml. of 1% Lidocaine without Epinephrine.  The spinal needle was inserted toward the target using a "trajectory" view along the fluoroscope beam.  Under AP and lateral visualization, the needle was advanced so it did not puncture dura and was located close the 6 O'Clock position of the pedical in AP tracterory. Biplanar projections were used to confirm position. Aspiration was confirmed to be negative for CSF and/or blood. A 1-2 ml. volume of Isovue-250 was injected and flow of contrast was noted at each level. Radiographs were obtained for documentation purposes.   After attaining the desired flow of contrast documented  above, a 0.5 to 1.0 ml test dose of 0.25% Marcaine was injected into each respective transforaminal space.  The patient was observed for 90 seconds post injection.  After no sensory deficits were reported, and normal lower extremity motor function was noted,   the above injectate was administered so that equal amounts of the injectate were placed at each foramen (level) into the transforaminal epidural space.   Additional Comments:  The patient tolerated the procedure well Dressing: Band-Aid    Post-procedure details: Patient was observed during the procedure. Post-procedure instructions were reviewed.  Patient left the clinic in stable condition.

## 2018-06-08 ENCOUNTER — Encounter (INDEPENDENT_AMBULATORY_CARE_PROVIDER_SITE_OTHER): Payer: Self-pay | Admitting: Physical Medicine and Rehabilitation

## 2018-06-08 DIAGNOSIS — G4730 Sleep apnea, unspecified: Secondary | ICD-10-CM | POA: Insufficient documentation

## 2018-06-08 DIAGNOSIS — R1310 Dysphagia, unspecified: Secondary | ICD-10-CM | POA: Insufficient documentation

## 2018-06-08 DIAGNOSIS — H3530 Unspecified macular degeneration: Secondary | ICD-10-CM | POA: Insufficient documentation

## 2018-06-08 DIAGNOSIS — M1990 Unspecified osteoarthritis, unspecified site: Secondary | ICD-10-CM | POA: Insufficient documentation

## 2018-06-08 DIAGNOSIS — M19019 Primary osteoarthritis, unspecified shoulder: Secondary | ICD-10-CM | POA: Insufficient documentation

## 2018-06-08 DIAGNOSIS — K219 Gastro-esophageal reflux disease without esophagitis: Secondary | ICD-10-CM | POA: Insufficient documentation

## 2018-06-08 NOTE — Progress Notes (Signed)
Alicia [name] - 72 y.o. female MRN [id number]  Date of birth: 1946-03-29  Office Visit Note: Visit Date: 05/26/2018 PCP: Doretha Sou, MD Referred by: Doretha Sou*  Subjective: Chief Complaint  Patient presents with  . Right Hip - Pain  . Left Hip - Pain  . Lower Back - Pain   HPI: [name] is a 72 year old female that we have seen in the past and completed epidural injection from an interlaminar approach back in January that seem to be helpful for a little while.  She reports pain really on average she says a 9 out of 10 but with really pretty minor findings in the lumbar spine she has pretty significant facet arthropathy from 2018 MRI particularly on the left at L3-4 and L4-5.  She has significant foraminal stenosis however on the left but not the right.  There is disc protrusion.  No focal nerve compression.  She comes in today with a complaint of 3 weeks of severe low back pain that she refers to as in both hips and even some in the groin particularly on the left hip.  She reports standing for too long really makes her pain worse she does use Tylenol frequently for pain control.  She has been using meloxicam as well that she refers to as her arthritis medicine.  She reports her primary care physician has changed that to 7/2 mg daily instead of twice a day.  She has a history of GERD.  She reports no new injury or trauma.  No bowel or bladder issues no focal weakness.  She does reports excruciating pain that she feels like her hips are being jammed up into her back.  Prior x-rays of the lumbar spine showed fairly normal-appearing hips at least from that view.  Her history can be reviewed that she had radiofrequency ablation procedure performed in [place] which is the worst thing she says she is ever gone through.  We discussed this with her in the past when we first met her that those are usually not that painful and it may be something that we should  look at again at some point given her history of arthritic changes of the lumbar spine.   Review of Systems  Constitutional: Negative for chills, fever, malaise/fatigue and weight loss.  HENT: Negative for hearing loss and sinus pain.   Eyes: Negative for blurred vision, double vision and photophobia.  Respiratory: Negative for cough and shortness of breath.   Cardiovascular: Negative for chest pain, palpitations and leg swelling.  Gastrointestinal: Negative for abdominal pain, nausea and vomiting.  Genitourinary: Negative for flank pain.  Musculoskeletal: Positive for back pain and joint pain. Negative for myalgias.  Skin: Negative for itching and rash.  Neurological: Negative for tremors, focal weakness and weakness.  Endo/Heme/Allergies: Negative.   Psychiatric/Behavioral: Negative for depression.  All other systems reviewed and are negative.  Otherwise per HPI.  Assessment & Plan: Visit Diagnoses:  1. Spondylosis without myelopathy or radiculopathy, lumbar region   2. Degenerative disc disease, lumbar   3. Chronic bilateral low back pain without sciatica   4. Chronic pain syndrome     Plan: Findings:  Chronic axial low back pain and chronic pain syndrome with pain really out of proportion than you would expect from lumbar imaging.  She does have severe facet arthropathy left more than right and I would give someone quite a bit of pain but she really has not responded well to prior radiofrequency ablation at another provider.  In terms of that radiofrequency ablation the traumatic experience of it because she says it was so painful it was harder decide if it helped at first but she does feel like it did help overall.  Prior epidural injection in our office was uneventful and she did well with that and seem like it did help some.  She was doing better on 15 mg of meloxicam which was being taken 7-1/2 mg twice a day.  I vascular talk to her primary care physician about changing that back  if they felt like that was okay.  She could also take that intermittently in terms of flareups and is probably safer to do an anti-inflammatory that way.  She should continue with the Tylenol taken regularly 3 times a day as a scheduled medicine and I think that works better we discussed that.  I do think the next step is bilateral transforaminal injection at L4 just to see how much relief she gets.  Depending on the relief would look at one time intra-articular facet joint injection just to see if that gives her enough relief of the hip and thigh pain.  She continues to be a smoker I believe which could be related to higher pain complaints as well and that something her primary care physician should address and will talk about that as we go along.  She has had physical therapy she should be more active and we talked about exercise.    Meds & Orders: No orders of the defined types were placed in this encounter.  No orders of the defined types were placed in this encounter.   Follow-up: Return for Bilateral L4 transforaminal epidural steroid injection.   Procedures: No procedures performed  No notes on file   Clinical History: MRI LUMBAR SPINE WITHOUT CONTRAST  TECHNIQUE: Multiplanar, multisequence MR imaging of the lumbar spine was performed. No intravenous contrast was administered.  COMPARISON:  09/14/2017 lumbar spine radiographs.  FINDINGS: Segmentation:  Standard.  Alignment: Mild lumbar levocurvature with apex at L3. Straightening of lumbar lordosis. No listhesis.  Vertebrae: Mild multilevel endplate degenerative edema at the L2-3 through L5-S1 levels. No evidence of discitis or fracture. 13 mm intermediate signal focus in L3 vertebral body. Subcentimeter T1/T2 hyperintense focus in L1 vertebral body, likely hemangioma.  Conus medullaris and cauda equina: Conus extends to the L1 level. Conus and cauda equina appear normal.  Paraspinal and other soft tissues:  Negative.  Disc levels:  L1-2: Minimal disc bulge. No significant foraminal or canal stenosis.  L2-3: Diffuse disc bulge, endplate marginal osteophytes, and right greater than left facet/ligamentum flavum hypertrophy. Moderate right and mild left foraminal stenosis. No significant canal stenosis.  L3-4: Diffuse disc bulge, small central annular fissure, moderate bilateral facet and mild ligamentum flavum hypertrophy. Mild bilateral foraminal stenosis. No significant canal stenosis.  L4-5: Disc bulge eccentric to the left with left foraminal and extraforaminal prominent marginal osteophytes, mild right/ severe left facet hypertrophy, and left ligamentum flavum hypertrophy. Severe left foraminal stenosis. Left lateral recess narrowing with disc contact on descending left L5 nerve root.  L5-S1: Small disc bulge, bilateral foraminal and extraforaminal marginal osteophytes, and advanced facet hypertrophy. Severe bilateral foraminal stenosis. No significant canal stenosis.  IMPRESSION: 1. No acute osseous abnormality.  Mild lumbar levocurvature. 2. Indeterminate 13 mm L3 vertebral body intermediate signal focus. In the absence of primary malignancy this probably represents red marrow. If there is clinical concern for metastatic disease, consider lumbar spine MRI with contrast or bone scan for further  assessment. 3. No significant canal stenosis. 4. Multilevel mild and moderate foraminal stenosis. Severe left L4-5 and bilateral L5-S1 foraminal stenosis.   Electronically Signed   By: Kristine Garbe M.D.   On: 10/05/2017 13:51   She reports that she has been smoking.  She has never used smokeless tobacco. No results for input(s): HGBA1C, LABURIC in the last 8760 hours.  Objective:  VS:  HT:'5\' 2"'  (157.5 cm)   WT:134 lb (60.8 kg)  BMI:24.5    BP:136/72  HR:75bpm  TEMP:98 F (36.7 C)(Oral)  RESP:98 % Physical Exam  Constitutional: She is oriented to person,  place, and time. She appears well-developed and well-nourished. No distress.  Obese  HENT:  Head: Normocephalic and atraumatic.  Nose: Nose normal.  Mouth/Throat: Oropharynx is clear and moist.  Eyes: Pupils are equal, round, and reactive to light. Conjunctivae are normal.  Neck: Normal range of motion. Neck supple.  Cardiovascular: Regular rhythm and intact distal pulses.  Pulmonary/Chest: Effort normal. No respiratory distress.  Abdominal: She exhibits no distension. There is no guarding.  Musculoskeletal:  Patient has a hard time going from sit to full extension and does have pain with facet joint loading and extension of the lumbar spine.  No pain with hip rotation internal or external.  No pain over the greater trochanter she has good distal strength without deficit no clonus.  She has no focal trigger points noted in the lumbar spine.  No paraspinal tenderness.  Neurological: She is alert and oriented to person, place, and time. She exhibits normal muscle tone. Coordination normal.  Skin: Skin is warm. No rash noted. No erythema.  Psychiatric: She has a normal mood and affect. Her behavior is normal.  Nursing note and vitals reviewed.   Ortho Exam Imaging: No results found.  Past Medical/Family/Surgical/Social History: Medications & Allergies reviewed per EMR, new medications updated. Patient Active Problem List   Diagnosis Date Noted  . Arthritis 06/08/2018  . GERD (gastroesophageal reflux disease) 06/08/2018  . Macular degeneration 06/08/2018  . Sleep apnea 06/08/2018  . Trouble swallowing 06/08/2018   History reviewed. No pertinent past medical history. History reviewed. No pertinent family history. History reviewed. No pertinent surgical history. Social History   Occupational History  . Not on file  Tobacco Use  . Smoking status: Current Every Day Smoker  . Smokeless tobacco: Never Used  Substance and Sexual Activity  . Alcohol use: Not on file  . Drug use: Not  on file  . Sexual activity: Not on file

## 2018-06-13 ENCOUNTER — Encounter (INDEPENDENT_AMBULATORY_CARE_PROVIDER_SITE_OTHER): Payer: Self-pay | Admitting: Physical Medicine and Rehabilitation

## 2018-06-23 ENCOUNTER — Encounter (INDEPENDENT_AMBULATORY_CARE_PROVIDER_SITE_OTHER): Payer: Self-pay | Admitting: Physical Medicine and Rehabilitation

## 2018-06-23 ENCOUNTER — Ambulatory Visit (INDEPENDENT_AMBULATORY_CARE_PROVIDER_SITE_OTHER): Payer: Medicare Other | Admitting: Physical Medicine and Rehabilitation

## 2018-06-23 VITALS — BP 133/63 | HR 67

## 2018-06-23 DIAGNOSIS — M5416 Radiculopathy, lumbar region: Secondary | ICD-10-CM

## 2018-06-23 DIAGNOSIS — M25551 Pain in right hip: Secondary | ICD-10-CM | POA: Diagnosis not present

## 2018-06-23 DIAGNOSIS — M48062 Spinal stenosis, lumbar region with neurogenic claudication: Secondary | ICD-10-CM | POA: Diagnosis not present

## 2018-06-23 DIAGNOSIS — M419 Scoliosis, unspecified: Secondary | ICD-10-CM

## 2018-06-23 NOTE — Progress Notes (Addendum)
 .  Numeric Pain Rating Scale and Functional Assessment Average Pain 8 Pain Right Now 2 My pain is intermittent and aching Pain is worse with: walking Pain improves with: medication   In the last MONTH (on 0-10 scale) has pain interfered with the following?  1. General activity like being  able to carry out your everyday physical activities such as walking, climbing stairs, carrying groceries, or moving a chair?  Rating(7)  2. Relation with others like being able to carry out your usual social activities and roles such as  activities at home, at work and in your community. Rating(4)  3. Enjoyment of life such that you have  been bothered by emotional problems such as feeling anxious, depressed or irritable?  Rating(0)

## 2018-06-24 ENCOUNTER — Encounter (INDEPENDENT_AMBULATORY_CARE_PROVIDER_SITE_OTHER): Payer: Self-pay | Admitting: Physical Medicine and Rehabilitation

## 2018-06-24 NOTE — Progress Notes (Signed)
Alicia [name] - 72 y.o. female MRN [id number]  Date of birth: 1945-12-16  Office Visit Note: Visit Date: 06/23/2018 PCP: Doretha Sou, MD Referred by: Doretha Sou*  Subjective: Chief Complaint  Patient presents with  . Right Hip - Pain   HPI: [name] is a 72 year old female with chronic back and hip pain bilaterally with more left radicular leg pain historically.  She is 2 weeks out from left L4 transforaminal epidural steroid injection with excellent relief of her left hip and leg pain.  Her biggest complaint now is still the right buttock pain with some pain referred into the groin.  She has pain mainly with walking and putting pressure on the leg.  She does ambulate with a cane.  She does have some difficulty getting out of the car and with steps.  She has more relief with rest.  She rates her pain on average is an 8 out of 10 still at this point.  She has had no new trauma or falls recently.  She has had falls in the past and suffered broken ankle on the left.  Her history can be reviewed in our other notes.  She used to live near [place] and had a radiofrequency ablation procedure performed of her lower spine which was very problematic for her in a sense that the procedure was very painful which is rare but it does sound like she did have an issue.  She has had no paresthesias recently again or focal weakness no bowel or bladder difficulties.   Review of Systems  Constitutional: Negative for chills, fever, malaise/fatigue and weight loss.  HENT: Negative for hearing loss and sinus pain.   Eyes: Negative for blurred vision, double vision and photophobia.  Respiratory: Negative for cough and shortness of breath.   Cardiovascular: Negative for chest pain, palpitations and leg swelling.  Gastrointestinal: Negative for abdominal pain, nausea and vomiting.  Genitourinary: Negative for flank pain.  Musculoskeletal: Positive for back pain and joint  pain. Negative for myalgias.  Skin: Negative for itching and rash.  Neurological: Negative for tremors, focal weakness and weakness.  Endo/Heme/Allergies: Negative.   Psychiatric/Behavioral: Negative for depression.  All other systems reviewed and are negative.  Otherwise per HPI.  Assessment & Plan: Visit Diagnoses:  1. Pain in right hip   2. Lumbar radiculopathy   3. Spinal stenosis of lumbar region with neurogenic claudication   4. Scoliosis of lumbar spine, unspecified scoliosis type     Plan: Findings:  Chronic worsening back and hip pain bilaterally with left radicular leg pain that has been resolved with epidural injection at least over the last couple of weeks on the left.  Left L4 transforaminal injection did seem to give her quite a bit of relief.  She has significant scoliosis and foraminal stenosis particularly at L4 and lateral recess narrowing on the left between L4 and L5.  She has multilevel facet arthropathy.  In terms of her right hip which is really what is giving her grief at this point she does have painful range of motion internally with the right hip.  Images of the lumbar spine that we had reviewed several months ago do reveal arthritis of the right hip more than the left.  I do not think there is a need today to repeat images specifically of the hip but that the consideration depending on the next step.  I would like to perform a diagnostic and hopefully therapeutic anesthetic arthrogram of the right hip and see  how she does.  We will get that approved and get her in as quick as we can for that.  If she gets a lot of relief with that but hopefully it something that will be long-lasting and we would consider further work-up of her hip.  If it does not seem to help much at all and I think we have to look at this more of a spine issue and we Artie talked about potentially looking at medication type management.    Meds & Orders: No orders of the defined types were placed in  this encounter.  No orders of the defined types were placed in this encounter.   Follow-up: Return for Planned right hip injection.   Procedures: No procedures performed  No notes on file   Clinical History: MRI LUMBAR SPINE WITHOUT CONTRAST  TECHNIQUE: Multiplanar, multisequence MR imaging of the lumbar spine was performed. No intravenous contrast was administered.  COMPARISON:  09/14/2017 lumbar spine radiographs.  FINDINGS: Segmentation:  Standard.  Alignment: Mild lumbar levocurvature with apex at L3. Straightening of lumbar lordosis. No listhesis.  Vertebrae: Mild multilevel endplate degenerative edema at the L2-3 through L5-S1 levels. No evidence of discitis or fracture. 13 mm intermediate signal focus in L3 vertebral body. Subcentimeter T1/T2 hyperintense focus in L1 vertebral body, likely hemangioma.  Conus medullaris and cauda equina: Conus extends to the L1 level. Conus and cauda equina appear normal.  Paraspinal and other soft tissues: Negative.  Disc levels:  L1-2: Minimal disc bulge. No significant foraminal or canal stenosis.  L2-3: Diffuse disc bulge, endplate marginal osteophytes, and right greater than left facet/ligamentum flavum hypertrophy. Moderate right and mild left foraminal stenosis. No significant canal stenosis.  L3-4: Diffuse disc bulge, small central annular fissure, moderate bilateral facet and mild ligamentum flavum hypertrophy. Mild bilateral foraminal stenosis. No significant canal stenosis.  L4-5: Disc bulge eccentric to the left with left foraminal and extraforaminal prominent marginal osteophytes, mild right/ severe left facet hypertrophy, and left ligamentum flavum hypertrophy. Severe left foraminal stenosis. Left lateral recess narrowing with disc contact on descending left L5 nerve root.  L5-S1: Small disc bulge, bilateral foraminal and extraforaminal marginal osteophytes, and advanced facet hypertrophy.  Severe bilateral foraminal stenosis. No significant canal stenosis.  IMPRESSION: 1. No acute osseous abnormality.  Mild lumbar levocurvature. 2. Indeterminate 13 mm L3 vertebral body intermediate signal focus. In the absence of primary malignancy this probably represents red marrow. If there is clinical concern for metastatic disease, consider lumbar spine MRI with contrast or bone scan for further assessment. 3. No significant canal stenosis. 4. Multilevel mild and moderate foraminal stenosis. Severe left L4-5 and bilateral L5-S1 foraminal stenosis.   Electronically Signed   By: Kristine Garbe M.D.   On: 10/05/2017 13:51   She reports that she has been smoking. She has never used smokeless tobacco. No results for input(s): HGBA1C, LABURIC in the last 8760 hours.  Objective:  VS:  HT:    WT:   BMI:     BP:133/63  HR:67bpm  TEMP: ( )  RESP:  Physical Exam  Constitutional: She is oriented to person, place, and time. She appears well-developed and well-nourished.  Obese  Eyes: Pupils are equal, round, and reactive to light. Conjunctivae and EOM are normal.  Cardiovascular: Normal rate and intact distal pulses.  Pulmonary/Chest: Effort normal.  Musculoskeletal:  Patient is slow to rise from a seated position and she does have pain with extension of the lumbar spine and facet joint loading.  She has  no real pain over the greater trochanters.  She does have pain of the right hip and groin with internal rotation.  She has good distal strength without clonus bilaterally.  She ambulates with a cane with antalgic gait to the right.  Neurological: She is alert and oriented to person, place, and time. She exhibits normal muscle tone. Coordination normal.  Skin: Skin is warm and dry. No rash noted. No erythema.  Psychiatric: She has a normal mood and affect. Her behavior is normal.  Nursing note and vitals reviewed.   Ortho Exam Imaging: No results found.  Past  Medical/Family/Surgical/Social History: Medications & Allergies reviewed per EMR, new medications updated. Patient Active Problem List   Diagnosis Date Noted  . Arthritis 06/08/2018  . GERD (gastroesophageal reflux disease) 06/08/2018  . Macular degeneration 06/08/2018  . Sleep apnea 06/08/2018  . Trouble swallowing 06/08/2018   History reviewed. No pertinent past medical history. History reviewed. No pertinent family history. History reviewed. No pertinent surgical history. Social History   Occupational History  . Not on file  Tobacco Use  . Smoking status: Current Every Day Smoker  . Smokeless tobacco: Never Used  Substance and Sexual Activity  . Alcohol use: Not on file  . Drug use: Not on file  . Sexual activity: Not on file

## 2018-06-27 ENCOUNTER — Encounter (INDEPENDENT_AMBULATORY_CARE_PROVIDER_SITE_OTHER): Payer: Self-pay | Admitting: Physical Medicine and Rehabilitation

## 2018-06-27 ENCOUNTER — Ambulatory Visit (INDEPENDENT_AMBULATORY_CARE_PROVIDER_SITE_OTHER): Payer: Medicare Other | Admitting: Physical Medicine and Rehabilitation

## 2018-06-27 ENCOUNTER — Ambulatory Visit (INDEPENDENT_AMBULATORY_CARE_PROVIDER_SITE_OTHER): Payer: Self-pay

## 2018-06-27 DIAGNOSIS — M25551 Pain in right hip: Secondary | ICD-10-CM

## 2018-06-27 NOTE — Progress Notes (Signed)
Alicia [name] - 72 y.o. female MRN [id number]  Date of birth: 1945/12/25  Office Visit Note: Visit Date: 06/27/2018 PCP: Doretha Sou, MD Referred by: Doretha Sou*  Subjective: Chief Complaint  Patient presents with  . Right Hip - Pain   HPI: [name] is a 72 year old female that comes in today for planned right intra-articular hip injection diagnostically hopefully therapeutically.  She is having worsening right hip and groin pain.  Prior epidural injection helped her left leg 80% but this was a different issue.  Please see our last evaluation note for further details and justification.   ROS Otherwise per HPI.  Assessment & Plan: Visit Diagnoses:  1. Pain in right hip     Plan: Findings:  Diagnostic and hopefully therapeutic intra-articular hip injection fluoroscopic guidance.  Patient did have relief during the anesthetic phase.    Meds & Orders: No orders of the defined types were placed in this encounter.   Orders Placed This Encounter  Procedures  . Large Joint Inj: R hip joint  . XR C-ARM NO REPORT    Follow-up: Return if symptoms worsen or fail to improve.   Procedures: Large Joint Inj: R hip joint on 06/27/2018 4:15 PM Indications: pain and diagnostic evaluation Details: 22 G needle, anterior approach  Arthrogram: Yes  Medications: 80 mg triamcinolone acetonide 40 MG/ML; 3 mL bupivacaine 0.5 % Outcome: tolerated well, no immediate complications  Arthrogram demonstrated excellent flow of contrast throughout the joint surface without extravasation or obvious defect.  The patient had relief of symptoms during the anesthetic phase of the injection.  Procedure, treatment alternatives, risks and benefits explained, specific risks discussed. Consent was given by the patient. Immediately prior to procedure a time out was called to verify the correct patient, procedure, equipment, support staff and site/side marked as required.  Patient was prepped and draped in the usual sterile fashion.      No notes on file   Clinical History: MRI LUMBAR SPINE WITHOUT CONTRAST  TECHNIQUE: Multiplanar, multisequence MR imaging of the lumbar spine was performed. No intravenous contrast was administered.  COMPARISON:  09/14/2017 lumbar spine radiographs.  FINDINGS: Segmentation:  Standard.  Alignment: Mild lumbar levocurvature with apex at L3. Straightening of lumbar lordosis. No listhesis.  Vertebrae: Mild multilevel endplate degenerative edema at the L2-3 through L5-S1 levels. No evidence of discitis or fracture. 13 mm intermediate signal focus in L3 vertebral body. Subcentimeter T1/T2 hyperintense focus in L1 vertebral body, likely hemangioma.  Conus medullaris and cauda equina: Conus extends to the L1 level. Conus and cauda equina appear normal.  Paraspinal and other soft tissues: Negative.  Disc levels:  L1-2: Minimal disc bulge. No significant foraminal or canal stenosis.  L2-3: Diffuse disc bulge, endplate marginal osteophytes, and right greater than left facet/ligamentum flavum hypertrophy. Moderate right and mild left foraminal stenosis. No significant canal stenosis.  L3-4: Diffuse disc bulge, small central annular fissure, moderate bilateral facet and mild ligamentum flavum hypertrophy. Mild bilateral foraminal stenosis. No significant canal stenosis.  L4-5: Disc bulge eccentric to the left with left foraminal and extraforaminal prominent marginal osteophytes, mild right/ severe left facet hypertrophy, and left ligamentum flavum hypertrophy. Severe left foraminal stenosis. Left lateral recess narrowing with disc contact on descending left L5 nerve root.  L5-S1: Small disc bulge, bilateral foraminal and extraforaminal marginal osteophytes, and advanced facet hypertrophy. Severe bilateral foraminal stenosis. No significant canal stenosis.  IMPRESSION: 1. No acute osseous  abnormality.  Mild lumbar levocurvature. 2. Indeterminate 13 mm L3 vertebral  body intermediate signal focus. In the absence of primary malignancy this probably represents red marrow. If there is clinical concern for metastatic disease, consider lumbar spine MRI with contrast or bone scan for further assessment. 3. No significant canal stenosis. 4. Multilevel mild and moderate foraminal stenosis. Severe left L4-5 and bilateral L5-S1 foraminal stenosis.   Electronically Signed   By: Kristine Garbe M.D.   On: 10/05/2017 13:51   She reports that she has been smoking. She has never used smokeless tobacco. No results for input(s): HGBA1C, LABURIC in the last 8760 hours.  Objective:  VS:  HT:    WT:   BMI:     BP:   HR: bpm  TEMP: ( )  RESP:  Physical Exam  Ortho Exam Imaging: No results found.  Past Medical/Family/Surgical/Social History: Medications & Allergies reviewed per EMR, new medications updated. Patient Active Problem List   Diagnosis Date Noted  . Arthritis 06/08/2018  . GERD (gastroesophageal reflux disease) 06/08/2018  . Macular degeneration 06/08/2018  . Sleep apnea 06/08/2018  . Trouble swallowing 06/08/2018   History reviewed. No pertinent past medical history. History reviewed. No pertinent family history. History reviewed. No pertinent surgical history. Social History   Occupational History  . Not on file  Tobacco Use  . Smoking status: Current Every Day Smoker  . Smokeless tobacco: Never Used  Substance and Sexual Activity  . Alcohol use: Not on file  . Drug use: Not on file  . Sexual activity: Not on file

## 2018-06-27 NOTE — Progress Notes (Signed)
 .  Numeric Pain Rating Scale and Functional Assessment Average Pain 8   In the last MONTH (on 0-10 scale) has pain interfered with the following?  1. General activity like being  able to carry out your everyday physical activities such as walking, climbing stairs, carrying groceries, or moving a chair?  Rating(5)    -BT, -Dye Allergies.

## 2018-06-27 NOTE — Patient Instructions (Signed)

## 2018-06-30 ENCOUNTER — Telehealth (INDEPENDENT_AMBULATORY_CARE_PROVIDER_SITE_OTHER): Payer: Self-pay | Admitting: Physical Medicine and Rehabilitation

## 2018-07-05 MED ORDER — BUPIVACAINE HCL 0.5 % IJ SOLN
3.0000 mL | INTRAMUSCULAR | Status: AC | PRN
  Administered 2018-06-27: 3 mL via INTRA_ARTICULAR

## 2018-07-05 MED ORDER — TRIAMCINOLONE ACETONIDE 40 MG/ML IJ SUSP
80.0000 mg | INTRAMUSCULAR | Status: AC | PRN
  Administered 2018-06-27: 80 mg via INTRA_ARTICULAR

## 2018-07-06 ENCOUNTER — Telehealth (INDEPENDENT_AMBULATORY_CARE_PROVIDER_SITE_OTHER): Payer: Self-pay | Admitting: Physical Medicine and Rehabilitation

## 2018-07-06 NOTE — Telephone Encounter (Signed)
Would like OV to discuss, feel like left leg has been spine stenosis and right hip was actually hip.

## 2018-07-06 NOTE — Telephone Encounter (Signed)
Scheduled for 07/14/18 at [place].

## 2018-07-14 ENCOUNTER — Ambulatory Visit (INDEPENDENT_AMBULATORY_CARE_PROVIDER_SITE_OTHER): Payer: Self-pay

## 2018-07-14 ENCOUNTER — Ambulatory Visit (INDEPENDENT_AMBULATORY_CARE_PROVIDER_SITE_OTHER): Payer: Medicare Other | Admitting: Physical Medicine and Rehabilitation

## 2018-07-14 ENCOUNTER — Encounter

## 2018-07-14 ENCOUNTER — Ambulatory Visit (INDEPENDENT_AMBULATORY_CARE_PROVIDER_SITE_OTHER): Payer: Medicare Other

## 2018-07-14 ENCOUNTER — Encounter (INDEPENDENT_AMBULATORY_CARE_PROVIDER_SITE_OTHER): Payer: Self-pay | Admitting: Physical Medicine and Rehabilitation

## 2018-07-14 VITALS — BP 138/75 | HR 68

## 2018-07-14 DIAGNOSIS — M25552 Pain in left hip: Secondary | ICD-10-CM | POA: Diagnosis not present

## 2018-07-14 DIAGNOSIS — M419 Scoliosis, unspecified: Secondary | ICD-10-CM

## 2018-07-14 DIAGNOSIS — M25551 Pain in right hip: Secondary | ICD-10-CM | POA: Diagnosis not present

## 2018-07-14 DIAGNOSIS — M47816 Spondylosis without myelopathy or radiculopathy, lumbar region: Secondary | ICD-10-CM

## 2018-07-14 DIAGNOSIS — M48061 Spinal stenosis, lumbar region without neurogenic claudication: Secondary | ICD-10-CM

## 2018-07-14 MED ORDER — BUPIVACAINE HCL 0.5 % IJ SOLN
3.0000 mL | INTRAMUSCULAR | Status: AC | PRN
  Administered 2018-07-14: 3 mL via INTRA_ARTICULAR

## 2018-07-14 MED ORDER — TRIAMCINOLONE ACETONIDE 40 MG/ML IJ SUSP
80.0000 mg | INTRAMUSCULAR | Status: AC | PRN
  Administered 2018-07-14: 80 mg via INTRA_ARTICULAR

## 2018-07-14 NOTE — Patient Instructions (Signed)

## 2018-07-14 NOTE — Progress Notes (Signed)
Alicia [name] - 72 y.o. female MRN [id number]  Date of birth: 1946/08/12  Office Visit Note: Visit Date: 07/14/2018 PCP: Doretha Sou, MD Referred by: Doretha Sou*  Subjective: Chief Complaint  Patient presents with  . Left Hip - Pain   HPI: Alicia [name] is a very pleasant 72 year old female that we have seen now over the last several months with a history of chronic back pain and chronic hip pain.  Her case is complicated by obesity.  Brief review of history is that she moved here from [place] and had prior radiofrequency ablation of the lumbar spine which was a very painful procedure for her it did not seem to help but was almost quite traumatic which is very rare for that procedure.  Nonetheless the patient has done well with epidural injection for more left radicular type leg pain.  Most recently we saw her for diagnostic right hip injection which greatly improved her right hip and groin pain.  She reports almost 100% relief from that procedure and doing well.  She now comes in today fairly tearful with left hip pain which is mostly posterior buttock pain that travels around to the anterior groin.  She reports is very similar to the right.  No pain down the leg or paresthesias.  Again MRI findings of the lumbar spine show degenerative scoliosis and facet arthropathy and in particular left-sided lateral recess stenosis at L3-4 and L4-5.  She reports no focal weakness.  Pain is worse with ambulating and going from sit to stand.  She rates her pain on the left side is 9 out of 10.  The right side again is 100% better.  She reports some help with Tylenol.  She does ambulate with a cane.  We did obtain x-rays today of the pelvis and left hip.  Prior fluoroscopic imaging of the right hip did not show much in the way of severe arthritic changes although there are mild degenerative joint changes.  She also reports a significant amount of cramping.  She is really not  had any evaluation with her primary care physician in terms of the cramping.  She has been using magnesium and I believe tonic water.   Review of Systems  Constitutional: Negative for chills, fever, malaise/fatigue and weight loss.  HENT: Negative for hearing loss and sinus pain.   Eyes: Negative for blurred vision, double vision and photophobia.  Respiratory: Negative for cough and shortness of breath.   Cardiovascular: Negative for chest pain, palpitations and leg swelling.  Gastrointestinal: Negative for abdominal pain, nausea and vomiting.  Genitourinary: Negative for flank pain.  Musculoskeletal: Positive for back pain and joint pain. Negative for myalgias.  Skin: Negative for itching and rash.  Neurological: Negative for tremors, focal weakness and weakness.  Endo/Heme/Allergies: Negative.   Psychiatric/Behavioral: Negative for depression.  All other systems reviewed and are negative.  Otherwise per HPI.  Assessment & Plan: Visit Diagnoses:  1. Pain in left hip   2. Pain in right hip   3. Spondylosis without myelopathy or radiculopathy, lumbar region   4. Scoliosis of lumbar spine, unspecified scoliosis type   5. Spinal stenosis of lumbar region without neurogenic claudication     Plan: Findings:  Interesting but complicated case of chronic low back pain and bilateral hip pain.  Most recent evaluation for the right hip was consistent with right intra-articular type hip pain with painful rotation pain in the groin.  Diagnostic injection gave her 100% relief and is still better  at this point.  Prior left-sided transforaminal epidural injection at L4 to give her relief of her leg pain.  Her left-sided leg pain is improved but she is having posterior buttock pain with referral to the groin.  Exam is inconsistent to a degree that internal rotation seems to be not painful but logrolling and a positive Stinchfield test.  We did complete diagnostic hip injection today with fluoroscopic  guidance and the patient had near 100% relief during the anesthetic phase.  We will continue to watch and see how she does with the injection.  Next step would either be MRI of the pelvis and hips versus consultation with Dr. Ninfa Linden.  She clearly has lumbar scoliosis and stenosis but at least at this point a diagnostic hip injection was very beneficial.  X-rays of the hip today really were unrevealing and those are reviewed below.  For the cramping issue we recommended stretching which was demonstrated to her today.  We also recommended vitamin B12.    Meds & Orders: No orders of the defined types were placed in this encounter.   Orders Placed This Encounter  Procedures  . Large Joint Inj: L hip joint  . XR HIP UNILAT W OR W/O PELVIS 2-3 VIEWS LEFT  . XR C-ARM NO REPORT    Follow-up: Return if symptoms worsen or fail to improve.   Procedures: Large Joint Inj: L hip joint on 07/14/2018 1:28 PM Indications: diagnostic evaluation and pain Details: 22 G 3.5 in needle, fluoroscopy-guided anterior approach  Arthrogram: No  Medications: 80 mg triamcinolone acetonide 40 MG/ML; 3 mL bupivacaine 0.5 % Outcome: tolerated well, no immediate complications  There was excellent flow of contrast producing a partial arthrogram of the hip. The patient did have relief of symptoms during the anesthetic phase of the injection. Procedure, treatment alternatives, risks and benefits explained, specific risks discussed. Consent was given by the patient. Immediately prior to procedure a time out was called to verify the correct patient, procedure, equipment, support staff and site/side marked as required. Patient was prepped and draped in the usual sterile fashion.      No notes on file   Clinical History: MRI LUMBAR SPINE WITHOUT CONTRAST  TECHNIQUE: Multiplanar, multisequence MR imaging of the lumbar spine was performed. No intravenous contrast was administered.  COMPARISON:  09/14/2017 lumbar spine  radiographs.  FINDINGS: Segmentation:  Standard.  Alignment: Mild lumbar levocurvature with apex at L3. Straightening of lumbar lordosis. No listhesis.  Vertebrae: Mild multilevel endplate degenerative edema at the L2-3 through L5-S1 levels. No evidence of discitis or fracture. 13 mm intermediate signal focus in L3 vertebral body. Subcentimeter T1/T2 hyperintense focus in L1 vertebral body, likely hemangioma.  Conus medullaris and cauda equina: Conus extends to the L1 level. Conus and cauda equina appear normal.  Paraspinal and other soft tissues: Negative.  Disc levels:  L1-2: Minimal disc bulge. No significant foraminal or canal stenosis.  L2-3: Diffuse disc bulge, endplate marginal osteophytes, and right greater than left facet/ligamentum flavum hypertrophy. Moderate right and mild left foraminal stenosis. No significant canal stenosis.  L3-4: Diffuse disc bulge, small central annular fissure, moderate bilateral facet and mild ligamentum flavum hypertrophy. Mild bilateral foraminal stenosis. No significant canal stenosis.  L4-5: Disc bulge eccentric to the left with left foraminal and extraforaminal prominent marginal osteophytes, mild right/ severe left facet hypertrophy, and left ligamentum flavum hypertrophy. Severe left foraminal stenosis. Left lateral recess narrowing with disc contact on descending left L5 nerve root.  L5-S1: Small disc bulge, bilateral  foraminal and extraforaminal marginal osteophytes, and advanced facet hypertrophy. Severe bilateral foraminal stenosis. No significant canal stenosis.  IMPRESSION: 1. No acute osseous abnormality.  Mild lumbar levocurvature. 2. Indeterminate 13 mm L3 vertebral body intermediate signal focus. In the absence of primary malignancy this probably represents red marrow. If there is clinical concern for metastatic disease, consider lumbar spine MRI with contrast or bone scan for further assessment. 3. No  significant canal stenosis. 4. Multilevel mild and moderate foraminal stenosis. Severe left L4-5 and bilateral L5-S1 foraminal stenosis.   Electronically Signed   By: Kristine Garbe M.D.   On: 10/05/2017 13:51   She reports that she has been smoking. She has never used smokeless tobacco. No results for input(s): HGBA1C, LABURIC in the last 8760 hours.  Objective:  VS:  HT:    WT:   BMI:     BP:138/75  HR:68bpm  TEMP: ( )  RESP:  Physical Exam  Constitutional: She is oriented to person, place, and time. She appears well-developed and well-nourished. No distress.  Obese  HENT:  Head: Normocephalic and atraumatic.  Nose: Nose normal.  Mouth/Throat: Oropharynx is clear and moist.  Eyes: Pupils are equal, round, and reactive to light. Conjunctivae are normal.  Neck: Normal range of motion. Neck supple.  Cardiovascular: Regular rhythm and intact distal pulses.  Pulmonary/Chest: Effort normal. No respiratory distress.  Abdominal: She exhibits no distension. There is no guarding.  Musculoskeletal:  Patient is slow to rise from a seated position with a great deal of pain upon going from sit to stand.  She has trouble leaning forward to reach her foot.  She actually has pretty good range of motion with internal rotation of both hips.  Mild tenderness over the greater trochanter.  She has painful logrolling on the left and on the right.  She has a positive Stinchfield test on the left.  Neurological: She is alert and oriented to person, place, and time. She exhibits normal muscle tone. Coordination normal.  Skin: Skin is warm. No rash noted. No erythema.  Psychiatric: She has a normal mood and affect. Her behavior is normal.  Nursing note and vitals reviewed.   Ortho Exam Imaging: Xr C-arm No Report  Result Date: 07/14/2018 Please see Notes tab for imaging impression.   Past Medical/Family/Surgical/Social History: Medications & Allergies reviewed per EMR, new medications  updated. Patient Active Problem List   Diagnosis Date Noted  . Arthritis 06/08/2018  . GERD (gastroesophageal reflux disease) 06/08/2018  . Macular degeneration 06/08/2018  . Sleep apnea 06/08/2018  . Trouble swallowing 06/08/2018   History reviewed. No pertinent past medical history. History reviewed. No pertinent family history. History reviewed. No pertinent surgical history. Social History   Occupational History  . Not on file  Tobacco Use  . Smoking status: Current Every Day Smoker  . Smokeless tobacco: Never Used  Substance and Sexual Activity  . Alcohol use: Not on file  . Drug use: Not on file  . Sexual activity: Not on file

## 2018-07-14 NOTE — Progress Notes (Signed)
 .  Numeric Pain Rating Scale and Functional Assessment Average Pain 9 Pain Right Now 9 My pain is constant and dull Pain is worse with: walking, bending and some activites Pain improves with: rest and medication   In the last MONTH (on 0-10 scale) has pain interfered with the following?  1. General activity like being  able to carry out your everyday physical activities such as walking, climbing stairs, carrying groceries, or moving a chair?  Rating(8)  2. Relation with others like being able to carry out your usual social activities and roles such as  activities at home, at work and in your community. Rating(6)  3. Enjoyment of life such that you have  been bothered by emotional problems such as feeling anxious, depressed or irritable?  Rating(5)

## 2018-07-22 NOTE — Telephone Encounter (Signed)
Completed.

## 2018-10-17 ENCOUNTER — Telehealth (INDEPENDENT_AMBULATORY_CARE_PROVIDER_SITE_OTHER): Payer: Self-pay | Admitting: Physical Medicine and Rehabilitation

## 2018-10-17 NOTE — Telephone Encounter (Signed)
1.) which hip? We have injected both. 2) if left hip how did last injection do in terms of length of time? If was doing really well up until recently then ok to consider repeat otherwise MRI pelvis then [facility]

## 2018-10-17 NOTE — Telephone Encounter (Signed)
Bilateral hip pain left more than right. She states that the last left hip injection helped for 1-2 weeks.

## 2018-10-17 NOTE — Telephone Encounter (Signed)
I would be willing to do a bilateral L4 transforaminal injections for her lumbar spine and hope that her pain in her hips is radicular.  Otherwise she has seen Dr. Marlou Sa past for her knee believes.  If we really felt like it was hip related when I see her, we can get an appointment with him.

## 2018-10-18 NOTE — Telephone Encounter (Signed)
Patient is scheduled for 11/07/18 at 0830. Valium requested. Pharmacy is correct.

## 2018-10-20 ENCOUNTER — Other Ambulatory Visit (INDEPENDENT_AMBULATORY_CARE_PROVIDER_SITE_OTHER): Payer: Self-pay | Admitting: Physical Medicine and Rehabilitation

## 2018-10-20 DIAGNOSIS — F411 Generalized anxiety disorder: Secondary | ICD-10-CM

## 2018-10-20 MED ORDER — DIAZEPAM 5 MG PO TABS: ORAL_TABLET | ORAL | 0 refills | Status: DC

## 2018-10-20 NOTE — Telephone Encounter (Signed)
Done

## 2018-10-20 NOTE — Progress Notes (Signed)
Pre-procedure diazepam ordered for pre-operative anxiety.  

## 2018-11-07 ENCOUNTER — Ambulatory Visit (INDEPENDENT_AMBULATORY_CARE_PROVIDER_SITE_OTHER): Payer: Medicare Other | Admitting: Physical Medicine and Rehabilitation

## 2018-11-07 ENCOUNTER — Encounter (INDEPENDENT_AMBULATORY_CARE_PROVIDER_SITE_OTHER): Payer: Self-pay | Admitting: Physical Medicine and Rehabilitation

## 2018-11-07 ENCOUNTER — Ambulatory Visit (INDEPENDENT_AMBULATORY_CARE_PROVIDER_SITE_OTHER): Payer: Self-pay

## 2018-11-07 VITALS — BP 148/84 | HR 82 | Temp 98.2°F

## 2018-11-07 DIAGNOSIS — M5416 Radiculopathy, lumbar region: Secondary | ICD-10-CM | POA: Diagnosis not present

## 2018-11-07 DIAGNOSIS — M25552 Pain in left hip: Secondary | ICD-10-CM | POA: Diagnosis not present

## 2018-11-07 DIAGNOSIS — M48061 Spinal stenosis, lumbar region without neurogenic claudication: Secondary | ICD-10-CM

## 2018-11-07 DIAGNOSIS — M25551 Pain in right hip: Secondary | ICD-10-CM

## 2018-11-07 DIAGNOSIS — R102 Pelvic and perineal pain: Secondary | ICD-10-CM

## 2018-11-07 DIAGNOSIS — G894 Chronic pain syndrome: Secondary | ICD-10-CM | POA: Diagnosis not present

## 2018-11-07 MED ORDER — DULOXETINE HCL 30 MG PO CPEP: 30.0000 mg | ORAL_CAPSULE | Freq: Every day | ORAL | 1 refills | Status: DC

## 2018-11-07 MED ORDER — BETAMETHASONE SOD PHOS & ACET 6 (3-3) MG/ML IJ SUSP
12.0000 mg | Freq: Once | INTRAMUSCULAR | Status: AC
  Administered 2018-11-07: 12 mg

## 2018-11-07 NOTE — Progress Notes (Addendum)
 .  Numeric Pain Rating Scale and Functional Assessment Average Pain 8   In the last MONTH (on 0-10 scale) has pain interfered with the following?  1. General activity like being  able to carry out your everyday physical activities such as walking, climbing stairs, carrying groceries, or moving a chair?  Rating(7)   +Driver, -BT, -Dye Allergies.  

## 2018-11-07 NOTE — Progress Notes (Signed)
Alicia [name] - 72 y.o. female MRN [id number]  Date of birth: 01-25-1946  Office Visit Note: Visit Date: 11/07/2018 PCP: Doretha Sou, MD Referred by: Doretha Sou*  Subjective: Chief Complaint  Patient presents with  . Lower Back - Pain  . Right Leg - Pain  . Left Leg - Pain   HPI: Alicia [name] is a 72 y.o. female who comes in today For possible lumbar transforaminal injection at L4 which is helped her in the past.  Unfortunately she comes in with a multitude of complaints once again including low back and pelvic pain.  She feels like when she walks ambulates it is like 100 pounds attached to her waist.  She reports not be able to walk 50 feet without having a lot of issues with dragging her legs.  She denies any tingling numbness.  She has had a history of lumbar spondylosis.  She is morbidly obese unfortunately.  She tries to stay somewhat active.  She is really had chronic pain issues.  No new injuries.  The last 2 times I have seen her we have completed intra-articular hip injection with actually diagnostic relief of her hip and groin pain.  The right side did seem to last for a while the left side only helped for a couple weeks.  X-rays were unrevealing of the hips and pelvis.  Very mild arthritic changes.  MRI of the lumbar spine was in 2018 that showed foraminal narrowing and facet arthritis.  She had a bad experience with prior lumbar radiofrequency ablation by another practitioner.  She has not done well with pain medications in general.  She reports different tolerances with different medications.  She does not carry a diagnosis of fibromyalgia.  Review of Systems  Constitutional: Negative for chills, fever, malaise/fatigue and weight loss.  HENT: Negative for hearing loss and sinus pain.   Eyes: Negative for blurred vision, double vision and photophobia.  Respiratory: Negative for cough and shortness of breath.   Cardiovascular: Negative  for chest pain, palpitations and leg swelling.  Gastrointestinal: Negative for abdominal pain, nausea and vomiting.  Genitourinary: Negative for flank pain.  Musculoskeletal: Positive for back pain and joint pain. Negative for myalgias.  Skin: Negative for itching and rash.  Neurological: Positive for weakness. Negative for tremors and focal weakness.  Endo/Heme/Allergies: Negative.   Psychiatric/Behavioral: Negative for depression.  All other systems reviewed and are negative.  Otherwise per HPI.  Assessment & Plan: Visit Diagnoses:  1. Lumbar radiculopathy   2. Foraminal stenosis of lumbar region   3. Pain in left hip   4. Pain in right hip   5. Pelvic pain   6. Chronic pain syndrome     Plan: Findings:  Chronic pain syndrome and chronic musculoskeletal pain with chronic low back and hip and pelvic pain.  She has some pain posteriorly some pain anteriorly she has some pain occasionally down the thigh.  She is morbidly obese unfortunately and continue to try to stay somewhat active.  She is getting somewhat depressed because of the fact that she just cannot seem to get over the pain aspects of things and it also limits what she can do daily.  At times she is done better with injection particular lumbar spine and hips.  I think the best approach is repeat L4 transforaminal injection which has not been done in a while.  She will monitor that over the next 2 weeks and see how she does if it is a major  difference then we can watch.  If is not a major difference in 1 to get an MRI of the pelvis to look at her hips and other areas of potential pain.  I do not think she has sacral insufficiency fracture but I cannot rule out out.  Could be more arthritic changes of the hips than showing on x-ray.  We are going to also start duloxetine at 30 mg at night.  We will up this to 60 mg over the course of a couple weeks.  We talked about the risk and benefits of this.    Meds & Orders:  Meds ordered this  encounter  Medications  . betamethasone acetate-betamethasone sodium phosphate (CELESTONE) injection 12 mg  . DULoxetine (CYMBALTA) 30 MG capsule    Sig: Take 1 capsule (30 mg total) by mouth daily. For 7 nights then take 2 capsules daily at night    Dispense:  60 capsule    Refill:  1    Orders Placed This Encounter  Procedures  . XR C-ARM NO REPORT  . Epidural Steroid injection    Follow-up: Return if symptoms worsen or fail to improve.   Procedures: No procedures performed  Lumbosacral Transforaminal Epidural Steroid Injection - Sub-Pedicular Approach with Fluoroscopic Guidance  Patient: Alicia [name]      Date of Birth: Mar 31, 1946 MRN: [id number] PCP: Doretha Sou, MD      Visit Date: 11/07/2018   Universal Protocol:    Date/Time: 11/07/2018  Consent Given By: the patient  Position: PRONE  Additional Comments: Vital signs were monitored before and after the procedure. Patient was prepped and draped in the usual sterile fashion. The correct patient, procedure, and site was verified.   Injection Procedure Details:  Procedure Site One Meds Administered:  Meds ordered this encounter  Medications  . betamethasone acetate-betamethasone sodium phosphate (CELESTONE) injection 12 mg  . DULoxetine (CYMBALTA) 30 MG capsule    Sig: Take 1 capsule (30 mg total) by mouth daily. For 7 nights then take 2 capsules daily at night    Dispense:  60 capsule    Refill:  1    Laterality: Bilateral  Location/Site:  L4-L5  Needle size: 22 G  Needle type: Spinal  Needle Placement: Transforaminal  Findings:    -Comments: Excellent flow of contrast along the nerve and into the epidural space.  Procedure Details: After squaring off the end-plates to get a true AP view, the C-arm was positioned so that an oblique view of the foramen as noted above was visualized. The target area is just inferior to the "nose of the scotty dog" or sub pedicular. The soft  tissues overlying this structure were infiltrated with 2-3 ml. of 1% Lidocaine without Epinephrine.  The spinal needle was inserted toward the target using a "trajectory" view along the fluoroscope beam.  Under AP and lateral visualization, the needle was advanced so it did not puncture dura and was located close the 6 O'Clock position of the pedical in AP tracterory. Biplanar projections were used to confirm position. Aspiration was confirmed to be negative for CSF and/or blood. A 1-2 ml. volume of Isovue-250 was injected and flow of contrast was noted at each level. Radiographs were obtained for documentation purposes.   After attaining the desired flow of contrast documented above, a 0.5 to 1.0 ml test dose of 0.25% Marcaine was injected into each respective transforaminal space.  The patient was observed for 90 seconds post injection.  After no sensory deficits were reported, and  normal lower extremity motor function was noted,   the above injectate was administered so that equal amounts of the injectate were placed at each foramen (level) into the transforaminal epidural space.   Additional Comments:  The patient tolerated the procedure well Dressing: Band-Aid    Post-procedure details: Patient was observed during the procedure. Post-procedure instructions were reviewed.  Patient left the clinic in stable condition.     Clinical History: MRI LUMBAR SPINE WITHOUT CONTRAST  TECHNIQUE: Multiplanar, multisequence MR imaging of the lumbar spine was performed. No intravenous contrast was administered.  COMPARISON:  09/14/2017 lumbar spine radiographs.  FINDINGS: Segmentation:  Standard.  Alignment: Mild lumbar levocurvature with apex at L3. Straightening of lumbar lordosis. No listhesis.  Vertebrae: Mild multilevel endplate degenerative edema at the L2-3 through L5-S1 levels. No evidence of discitis or fracture. 13 mm intermediate signal focus in L3 vertebral body.  Subcentimeter T1/T2 hyperintense focus in L1 vertebral body, likely hemangioma.  Conus medullaris and cauda equina: Conus extends to the L1 level. Conus and cauda equina appear normal.  Paraspinal and other soft tissues: Negative.  Disc levels:  L1-2: Minimal disc bulge. No significant foraminal or canal stenosis.  L2-3: Diffuse disc bulge, endplate marginal osteophytes, and right greater than left facet/ligamentum flavum hypertrophy. Moderate right and mild left foraminal stenosis. No significant canal stenosis.  L3-4: Diffuse disc bulge, small central annular fissure, moderate bilateral facet and mild ligamentum flavum hypertrophy. Mild bilateral foraminal stenosis. No significant canal stenosis.  L4-5: Disc bulge eccentric to the left with left foraminal and extraforaminal prominent marginal osteophytes, mild right/ severe left facet hypertrophy, and left ligamentum flavum hypertrophy. Severe left foraminal stenosis. Left lateral recess narrowing with disc contact on descending left L5 nerve root.  L5-S1: Small disc bulge, bilateral foraminal and extraforaminal marginal osteophytes, and advanced facet hypertrophy. Severe bilateral foraminal stenosis. No significant canal stenosis.  IMPRESSION: 1. No acute osseous abnormality.  Mild lumbar levocurvature. 2. Indeterminate 13 mm L3 vertebral body intermediate signal focus. In the absence of primary malignancy this probably represents red marrow. If there is clinical concern for metastatic disease, consider lumbar spine MRI with contrast or bone scan for further assessment. 3. No significant canal stenosis. 4. Multilevel mild and moderate foraminal stenosis. Severe left L4-5 and bilateral L5-S1 foraminal stenosis.   Electronically Signed   By: Kristine Garbe M.D.   On: 10/05/2017 13:51   She reports that she has been smoking. She has never used smokeless tobacco. No results for input(s): HGBA1C,  LABURIC in the last 8760 hours.  Objective:  VS:  HT:    WT:   BMI:     BP:(!) 148/84  HR:82bpm  TEMP:98.2 F (36.8 C)(Oral)  RESP:  Physical Exam Vitals signs and nursing note reviewed.  Constitutional:      General: She is not in acute distress.    Appearance: Normal appearance. She is well-developed. She is obese.  HENT:     Head: Normocephalic and atraumatic.     Nose: Nose normal.     Mouth/Throat:     Mouth: Mucous membranes are moist.     Pharynx: Oropharynx is clear.  Eyes:     Conjunctiva/sclera: Conjunctivae normal.     Pupils: Pupils are equal, round, and reactive to light.  Neck:     Musculoskeletal: Normal range of motion and neck supple.  Cardiovascular:     Rate and Rhythm: Regular rhythm.  Pulmonary:     Effort: Pulmonary effort is normal. No respiratory distress.  Abdominal:  General: There is no distension.     Palpations: Abdomen is soft.     Tenderness: There is no guarding.  Musculoskeletal:     Comments: Patient is very slow to rise from a seated position.  She has concordant pain with extension of the lumbar spine of her low back.  She has pain over the PSIS bilaterally and she actually has allodynia type pain with just light palpation across the lower back and buttock regions.  She does have some pain with hip rotation she has good distal strength without clonus.  Skin:    General: Skin is warm and dry.     Findings: No erythema or rash.  Neurological:     General: No focal deficit present.     Mental Status: She is alert and oriented to person, place, and time.     Sensory: No sensory deficit.     Motor: No abnormal muscle tone.     Coordination: Coordination normal.     Gait: Gait abnormal.  Psychiatric:        Behavior: Behavior normal.        Thought Content: Thought content normal.     Comments: Tearful at times     Ortho Exam Imaging: Xr C-arm No Report  Result Date: 11/07/2018 Please see Notes tab for imaging  impression.   Past Medical/Family/Surgical/Social History: Medications & Allergies reviewed per EMR, new medications updated. Patient Active Problem List   Diagnosis Date Noted  . Arthritis 06/08/2018  . GERD (gastroesophageal reflux disease) 06/08/2018  . Macular degeneration 06/08/2018  . Sleep apnea 06/08/2018  . Trouble swallowing 06/08/2018   History reviewed. No pertinent past medical history. History reviewed. No pertinent family history. History reviewed. No pertinent surgical history. Social History   Occupational History  . Not on file  Tobacco Use  . Smoking status: Current Every Day Smoker  . Smokeless tobacco: Never Used  Substance and Sexual Activity  . Alcohol use: Not on file  . Drug use: Not on file  . Sexual activity: Not on file

## 2018-11-07 NOTE — Patient Instructions (Signed)

## 2018-11-07 NOTE — Procedures (Signed)
Lumbosacral Transforaminal Epidural Steroid Injection - Sub-Pedicular Approach with Fluoroscopic Guidance  Patient: Alicia [name]      Date of Birth: 1946/04/26 MRN: [id number] PCP: Doretha Sou, MD      Visit Date: 11/07/2018   Universal Protocol:    Date/Time: 11/07/2018  Consent Given By: the patient  Position: PRONE  Additional Comments: Vital signs were monitored before and after the procedure. Patient was prepped and draped in the usual sterile fashion. The correct patient, procedure, and site was verified.   Injection Procedure Details:  Procedure Site One Meds Administered:  Meds ordered this encounter  Medications  . betamethasone acetate-betamethasone sodium phosphate (CELESTONE) injection 12 mg  . DULoxetine (CYMBALTA) 30 MG capsule    Sig: Take 1 capsule (30 mg total) by mouth daily. For 7 nights then take 2 capsules daily at night    Dispense:  60 capsule    Refill:  1    Laterality: Bilateral  Location/Site:  L4-L5  Needle size: 22 G  Needle type: Spinal  Needle Placement: Transforaminal  Findings:    -Comments: Excellent flow of contrast along the nerve and into the epidural space.  Procedure Details: After squaring off the end-plates to get a true AP view, the C-arm was positioned so that an oblique view of the foramen as noted above was visualized. The target area is just inferior to the "nose of the scotty dog" or sub pedicular. The soft tissues overlying this structure were infiltrated with 2-3 ml. of 1% Lidocaine without Epinephrine.  The spinal needle was inserted toward the target using a "trajectory" view along the fluoroscope beam.  Under AP and lateral visualization, the needle was advanced so it did not puncture dura and was located close the 6 O'Clock position of the pedical in AP tracterory. Biplanar projections were used to confirm position. Aspiration was confirmed to be negative for CSF and/or blood. A 1-2 ml.  volume of Isovue-250 was injected and flow of contrast was noted at each level. Radiographs were obtained for documentation purposes.   After attaining the desired flow of contrast documented above, a 0.5 to 1.0 ml test dose of 0.25% Marcaine was injected into each respective transforaminal space.  The patient was observed for 90 seconds post injection.  After no sensory deficits were reported, and normal lower extremity motor function was noted,   the above injectate was administered so that equal amounts of the injectate were placed at each foramen (level) into the transforaminal epidural space.   Additional Comments:  The patient tolerated the procedure well Dressing: Band-Aid    Post-procedure details: Patient was observed during the procedure. Post-procedure instructions were reviewed.  Patient left the clinic in stable condition.

## 2018-11-22 ENCOUNTER — Telehealth (INDEPENDENT_AMBULATORY_CARE_PROVIDER_SITE_OTHER): Payer: Self-pay | Admitting: Physical Medicine and Rehabilitation

## 2018-11-22 NOTE — Telephone Encounter (Signed)
Ask her if she up to 60 mg at night or just the 30. The sleeping will take care of itself, if tolerating medicine please stay the course. She needs to exercise to strenghthen the legs - shot wil not do that. Glad she is feeling better.

## 2018-11-22 NOTE — Telephone Encounter (Signed)
Patient reports tat she is taking 60 mg total at night.

## 2018-11-22 NOTE — Telephone Encounter (Signed)
Left message advising patient and asking her to call back to let us know about dosage.

## 2018-11-22 NOTE — Telephone Encounter (Signed)
Thanks she should stay there if tolerating and give up to a month to see how it is doing, will just monitor outcome of last injection

## 2018-11-29 ENCOUNTER — Other Ambulatory Visit (INDEPENDENT_AMBULATORY_CARE_PROVIDER_SITE_OTHER): Payer: Self-pay | Admitting: Physical Medicine and Rehabilitation

## 2018-11-29 MED ORDER — DULOXETINE HCL 60 MG PO CPEP: 60.0000 mg | ORAL_CAPSULE | Freq: Every day | ORAL | 3 refills | Status: DC

## 2018-11-29 NOTE — Telephone Encounter (Signed)
Please advise 

## 2018-12-30 ENCOUNTER — Telehealth (INDEPENDENT_AMBULATORY_CARE_PROVIDER_SITE_OTHER): Payer: Self-pay | Admitting: Physical Medicine and Rehabilitation

## 2019-01-02 NOTE — Telephone Encounter (Signed)
Scheduled for OV with Dr. Ernestina Patches on 2/27. Advised to schedule appointment with Dr. Marlou Sa for shoulder pain.

## 2019-01-02 NOTE — Telephone Encounter (Signed)
Dean for her shoulder and OV with me - we stated her on Cymbalta(Duloxetine) and know about the the back, last injection

## 2019-01-05 ENCOUNTER — Ambulatory Visit (INDEPENDENT_AMBULATORY_CARE_PROVIDER_SITE_OTHER): Payer: Medicare Other | Admitting: Physical Medicine and Rehabilitation

## 2019-01-05 ENCOUNTER — Encounter (INDEPENDENT_AMBULATORY_CARE_PROVIDER_SITE_OTHER): Payer: Self-pay | Admitting: Physical Medicine and Rehabilitation

## 2019-01-05 VITALS — BP 142/79 | HR 85 | Ht 62.0 in | Wt 238.0 lb

## 2019-01-05 DIAGNOSIS — M25551 Pain in right hip: Secondary | ICD-10-CM

## 2019-01-05 DIAGNOSIS — M48061 Spinal stenosis, lumbar region without neurogenic claudication: Secondary | ICD-10-CM | POA: Diagnosis not present

## 2019-01-05 DIAGNOSIS — M25552 Pain in left hip: Secondary | ICD-10-CM

## 2019-01-05 DIAGNOSIS — M47816 Spondylosis without myelopathy or radiculopathy, lumbar region: Secondary | ICD-10-CM | POA: Insufficient documentation

## 2019-01-05 DIAGNOSIS — M419 Scoliosis, unspecified: Secondary | ICD-10-CM | POA: Diagnosis not present

## 2019-01-05 NOTE — Progress Notes (Signed)
Alicia [name] - 73 y.o. female MRN [id number]  Date of birth: 11/22/1945  Office Visit Note: Visit Date: 01/05/2019 PCP: Doretha Sou, MD Referred by: Doretha Sou*  Subjective: Chief Complaint  Patient presents with  . Lower Back - Pain   HPI: Alicia [name] is a 73 y.o. female who comes in today For reevaluation of her bilateral low back pain bilateral buttock pain and some pain referred to the groin bilaterally.  She is in a somewhat difficult case to figure out a pain source.  She has pretty significant degenerative facet arthropathy and scoliosis particularly at the L3 and L4 level with right concave scoliosis.  She is had normal-appearing hips on x-ray but at least on one occasion intra-articular hip injection seem to give her about a month of relief.  Last injection we performed was a transforaminal injection which in the past it helped her quite a bit and once again it seemed to help but it is only about a month in length.  She denies any pain past the knees.  She denies any paresthesias.  She continues to do well with 60 mg of duloxetine and at least clinically seems to be doing much better with that medication overall.  She does ambulate with a cane.  Her symptoms are worst with prolonged sitting but also worse with prolonged standing.  Is difficult to get a hard answer from her but it seems to be worse with prolonged standing more than sitting.  She does not have any pain getting out of the car per se.  She has no radicular complaints past the knee.  No focal weakness.  Her history is interesting that she had a prior radiofrequency ablation procedure in [place] that was beneficial but was a very traumatic experience for her at least the procedure itself.  Review of Systems  Constitutional: Negative for chills, fever, malaise/fatigue and weight loss.  HENT: Negative for hearing loss and sinus pain.   Eyes: Negative for blurred vision, double  vision and photophobia.  Respiratory: Negative for cough and shortness of breath.   Cardiovascular: Negative for chest pain, palpitations and leg swelling.  Gastrointestinal: Negative for abdominal pain, nausea and vomiting.  Genitourinary: Negative for flank pain.  Musculoskeletal: Positive for back pain and joint pain. Negative for myalgias.  Skin: Negative for itching and rash.  Neurological: Negative for tremors, focal weakness and weakness.  Endo/Heme/Allergies: Negative.   Psychiatric/Behavioral: Negative for depression.  All other systems reviewed and are negative.  Otherwise per HPI.  Assessment & Plan: Visit Diagnoses:  1. Spondylosis without myelopathy or radiculopathy, lumbar region   2. Scoliosis of lumbar spine, unspecified scoliosis type   3. Foraminal stenosis of lumbar region   4. Pain in left hip   5. Pain in right hip     Plan: Findings:  I feel like most of her pain is likely facet mediated pain with a combination of some foraminal narrowing of the lower lumbar area.  While she has had pain referred to the groin she has no pain with range of motion today on exam or in the past.  He can freely range her hips without much pain.  X-rays of been unrevealing.  I think the best approach is diagnostic facet/medial branch blocks at L4-5 and L5-S1.  Depending on relief would look at radiofrequency ablation and would need to talk this over there once again like we did today as I think we have built some trust that I think we  can do that procedure without as pain inducing that she had in the past.  She reports that the prior physician really did not tell her anything and she was really unsure of what was being done to her at the time.  Sounds like a traumatic event.  Conversely I still cannot totally rule out a hip problem we could look at MRI of the pelvis at some point.  Should continue with Cymbalta and meloxicam.  Discussed once again weight loss.  Lastly did discuss the possibility  of ischial bursitis.  She has had physical therapy in the past particularly prior to seeing [place].    Meds & Orders: No orders of the defined types were placed in this encounter.  No orders of the defined types were placed in this encounter.   Follow-up: Return for Bilateral L4-5 and L5-S1 facet joints..   Procedures: No procedures performed  No notes on file   Clinical History: MRI LUMBAR SPINE WITHOUT CONTRAST  TECHNIQUE: Multiplanar, multisequence MR imaging of the lumbar spine was performed. No intravenous contrast was administered.  COMPARISON:  09/14/2017 lumbar spine radiographs.  FINDINGS: Segmentation:  Standard.  Alignment: Mild lumbar levocurvature with apex at L3. Straightening of lumbar lordosis. No listhesis.  Vertebrae: Mild multilevel endplate degenerative edema at the L2-3 through L5-S1 levels. No evidence of discitis or fracture. 13 mm intermediate signal focus in L3 vertebral body. Subcentimeter T1/T2 hyperintense focus in L1 vertebral body, likely hemangioma.  Conus medullaris and cauda equina: Conus extends to the L1 level. Conus and cauda equina appear normal.  Paraspinal and other soft tissues: Negative.  Disc levels:  L1-2: Minimal disc bulge. No significant foraminal or canal stenosis.  L2-3: Diffuse disc bulge, endplate marginal osteophytes, and right greater than left facet/ligamentum flavum hypertrophy. Moderate right and mild left foraminal stenosis. No significant canal stenosis.  L3-4: Diffuse disc bulge, small central annular fissure, moderate bilateral facet and mild ligamentum flavum hypertrophy. Mild bilateral foraminal stenosis. No significant canal stenosis.  L4-5: Disc bulge eccentric to the left with left foraminal and extraforaminal prominent marginal osteophytes, mild right/ severe left facet hypertrophy, and left ligamentum flavum hypertrophy. Severe left foraminal stenosis. Left lateral recess narrowing  with disc contact on descending left L5 nerve root.  L5-S1: Small disc bulge, bilateral foraminal and extraforaminal marginal osteophytes, and advanced facet hypertrophy. Severe bilateral foraminal stenosis. No significant canal stenosis.  IMPRESSION: 1. No acute osseous abnormality.  Mild lumbar levocurvature. 2. Indeterminate 13 mm L3 vertebral body intermediate signal focus. In the absence of primary malignancy this probably represents red marrow. If there is clinical concern for metastatic disease, consider lumbar spine MRI with contrast or bone scan for further assessment. 3. No significant canal stenosis. 4. Multilevel mild and moderate foraminal stenosis. Severe left L4-5 and bilateral L5-S1 foraminal stenosis.   Electronically Signed   By: Kristine Garbe M.D.   On: 10/05/2017 13:51   She reports that she has been smoking. She has never used smokeless tobacco. No results for input(s): HGBA1C, LABURIC in the last 8760 hours.  Objective:  VS:  HT:5\' 2"  (157.5 cm)   WT:238 lb (108 kg)  BMI:43.52    BP:(!) 142/79  HR:85bpm  TEMP: ( )  RESP:  Physical Exam Vitals signs and nursing note reviewed.  Constitutional:      General: She is not in acute distress.    Appearance: Normal appearance. She is well-developed. She is obese. She is not ill-appearing.  HENT:     Head: Normocephalic and  atraumatic.  Eyes:     Conjunctiva/sclera: Conjunctivae normal.     Pupils: Pupils are equal, round, and reactive to light.  Cardiovascular:     Rate and Rhythm: Normal rate.     Pulses: Normal pulses.  Pulmonary:     Effort: Pulmonary effort is normal.  Musculoskeletal:     Right lower leg: No edema.     Left lower leg: No edema.     Comments: Patient ambulates with a cane.  She is very slow to rise from a seated position.  She has pain across the lower back into the PSIS but also underneath more of an issue on the area.  She has some pain over the ischio bilaterally.   She has no pain with hip rotation internal or external.  She has good distal strength without clonus.  Skin:    General: Skin is warm and dry.     Findings: No erythema or rash.  Neurological:     General: No focal deficit present.     Mental Status: She is alert and oriented to person, place, and time.     Sensory: No sensory deficit.     Motor: No abnormal muscle tone.     Coordination: Coordination normal.     Gait: Gait abnormal.  Psychiatric:        Mood and Affect: Mood normal.        Behavior: Behavior normal.     Ortho Exam Imaging: No results found.  Past Medical/Family/Surgical/Social History: Medications & Allergies reviewed per EMR, new medications updated. Patient Active Problem List   Diagnosis Date Noted  . Spondylosis without myelopathy or radiculopathy, lumbar region 01/05/2019  . Foraminal stenosis of lumbar region 01/05/2019  . Arthritis 06/08/2018  . GERD (gastroesophageal reflux disease) 06/08/2018  . Macular degeneration 06/08/2018  . Sleep apnea 06/08/2018  . Trouble swallowing 06/08/2018   History reviewed. No pertinent past medical history. History reviewed. No pertinent family history. History reviewed. No pertinent surgical history. Social History   Occupational History  . Not on file  Tobacco Use  . Smoking status: Current Every Day Smoker  . Smokeless tobacco: Never Used  Substance and Sexual Activity  . Alcohol use: Not on file  . Drug use: Not on file  . Sexual activity: Not on file

## 2019-01-05 NOTE — Progress Notes (Signed)
.  Numeric Pain Rating Scale and Functional Assessment Average Pain 9 Pain Right Now 6 My pain is constant and sharp Pain is worse with: walking and sitting Pain improves with: medication   In the last MONTH (on 0-10 scale) has pain interfered with the following?  1. General activity like being  able to carry out your everyday physical activities such as walking, climbing stairs, carrying groceries, or moving a chair?  Rating(7)  2. Relation with others like being able to carry out your usual social activities and roles such as  activities at home, at work and in your community. Rating(7)  3. Enjoyment of life such that you have  been bothered by emotional problems such as feeling anxious, depressed or irritable?  Rating(3)

## 2019-01-13 ENCOUNTER — Ambulatory Visit (INDEPENDENT_AMBULATORY_CARE_PROVIDER_SITE_OTHER): Payer: Medicare Other | Admitting: Orthopedic Surgery

## 2019-01-13 ENCOUNTER — Encounter (INDEPENDENT_AMBULATORY_CARE_PROVIDER_SITE_OTHER): Payer: Self-pay | Admitting: Orthopedic Surgery

## 2019-01-13 ENCOUNTER — Ambulatory Visit (INDEPENDENT_AMBULATORY_CARE_PROVIDER_SITE_OTHER): Payer: Medicare Other

## 2019-01-13 DIAGNOSIS — M25511 Pain in right shoulder: Secondary | ICD-10-CM

## 2019-01-13 DIAGNOSIS — M7541 Impingement syndrome of right shoulder: Secondary | ICD-10-CM

## 2019-01-13 MED ORDER — BUPIVACAINE HCL 0.5 % IJ SOLN
9.0000 mL | INTRAMUSCULAR | Status: AC | PRN
  Administered 2019-01-13: 9 mL via INTRA_ARTICULAR

## 2019-01-13 MED ORDER — METHYLPREDNISOLONE ACETATE 40 MG/ML IJ SUSP
40.0000 mg | INTRAMUSCULAR | Status: AC | PRN
  Administered 2019-01-13: 40 mg via INTRA_ARTICULAR

## 2019-01-13 MED ORDER — LIDOCAINE HCL 1 % IJ SOLN
5.0000 mL | INTRAMUSCULAR | Status: AC | PRN
  Administered 2019-01-13: 5 mL

## 2019-01-13 NOTE — Progress Notes (Signed)
Office Visit Note   Patient: Alicia [name]           Date of Birth: September 23, 1946           MRN: [id number] Visit Date: 01/13/2019 Requested by: Doretha Sou, [place], [place] [id number] PCP: Doretha Sou, MD  Subjective: Chief Complaint  Patient presents with  . Shoulder Pain    HPI: Patient presents with 3-week history of atraumatic onset right shoulder pain.  She is right-hand dominant.  The pain will occasionally radiate down to the wrist but she denies any numbness and tingling.  Start on the right side of her neck.  She describes decreased range of motion with some catching.  It is throbbing at night and it is difficult for her to lay on that right side.  Taking Tylenol and Mobic.  She reports pain primarily in the deltoid region.  She has been trying to rake some recently.  Also describes some pain radiating to the trapezial region.              ROS: All systems reviewed are negative as they relate to the chief complaint within the history of present illness.  Patient denies  fevers or chills.   Assessment & Plan: Visit Diagnoses:  1. Right shoulder pain, unspecified chronicity   2. Impingement syndrome of right shoulder     Plan: Impression is right shoulder pain which looks like impingement.  I do not think this is necessarily rotator cuff pathology.  I would favor a diagnostic and therapeutic injection into the subacromial space.  If that does not work we may need to work-up her neck.  For now it looks like it is really coming from her shoulder.  Follow-up in 4 weeks if symptoms not resolved.  Follow-Up Instructions: Return if symptoms worsen or fail to improve.   Orders:  Orders Placed This Encounter  Procedures  . XR Shoulder Right   No orders of the defined types were placed in this encounter.     Procedures: Large Joint Inj: R subacromial bursa on 01/13/2019 10:01 AM Indications: diagnostic evaluation and  pain Details: 18 G 1.5 in needle, posterior approach  Arthrogram: No  Medications: 9 mL bupivacaine 0.5 %; 40 mg methylPREDNISolone acetate 40 MG/ML; 5 mL lidocaine 1 % Outcome: tolerated well, no immediate complications Procedure, treatment alternatives, risks and benefits explained, specific risks discussed. Consent was given by the patient. Immediately prior to procedure a time out was called to verify the correct patient, procedure, equipment, support staff and site/side marked as required. Patient was prepped and draped in the usual sterile fashion.       Clinical Data: No additional findings.  Objective: Vital Signs: There were no vitals taken for this visit.  Physical Exam:   Constitutional: Patient appears well-developed HEENT:  Head: Normocephalic Eyes:EOM are normal Neck: Normal range of motion Cardiovascular: Normal rate Pulmonary/chest: Effort normal Neurologic: Patient is alert Skin: Skin is warm Psychiatric: Patient has normal mood and affect    Ortho Exam: Ortho exam demonstrates full active and passive range of motion of the neck.  5 out of 5 grip EPL FPL interosseous wrist flexion extension bicep triceps and deltoid strength.  Radial pulse intact bilaterally.  No other masses lymphadenopathy or skin changes noted in that shoulder girdle region.  Rotator cuff strength is excellent.  Impingement signs equivocal on the right negative on the left.  Not much in the way of coarse grinding or crepitus  noted in that right shoulder girdle region.  Specialty Comments:  No specialty comments available.  Imaging: Xr Shoulder Right  Result Date: 01/13/2019 AP outlet and axillary right shoulder reviewed.  Acromiohumeral distance maintained.  No significant AC joint arthritis.  No fracture or dislocation.  Normal right shoulder.    PMFS History: Patient Active Problem List   Diagnosis Date Noted  . Spondylosis without myelopathy or radiculopathy, lumbar region  01/05/2019  . Foraminal stenosis of lumbar region 01/05/2019  . Arthritis 06/08/2018  . GERD (gastroesophageal reflux disease) 06/08/2018  . Macular degeneration 06/08/2018  . Sleep apnea 06/08/2018  . Trouble swallowing 06/08/2018   History reviewed. No pertinent past medical history.  History reviewed. No pertinent family history.  History reviewed. No pertinent surgical history. Social History   Occupational History  . Not on file  Tobacco Use  . Smoking status: Current Every Day Smoker  . Smokeless tobacco: Never Used  Substance and Sexual Activity  . Alcohol use: Not on file  . Drug use: Not on file  . Sexual activity: Not on file

## 2019-01-19 ENCOUNTER — Encounter (INDEPENDENT_AMBULATORY_CARE_PROVIDER_SITE_OTHER): Payer: Self-pay | Admitting: Physical Medicine and Rehabilitation

## 2019-01-19 ENCOUNTER — Ambulatory Visit (INDEPENDENT_AMBULATORY_CARE_PROVIDER_SITE_OTHER): Payer: Medicare Other | Admitting: Physical Medicine and Rehabilitation

## 2019-01-19 ENCOUNTER — Other Ambulatory Visit: Payer: Self-pay

## 2019-01-19 ENCOUNTER — Ambulatory Visit (INDEPENDENT_AMBULATORY_CARE_PROVIDER_SITE_OTHER): Payer: Self-pay

## 2019-01-19 VITALS — BP 142/86 | HR 83 | Temp 97.7°F

## 2019-01-19 DIAGNOSIS — M47816 Spondylosis without myelopathy or radiculopathy, lumbar region: Secondary | ICD-10-CM | POA: Diagnosis not present

## 2019-01-19 MED ORDER — METHYLPREDNISOLONE ACETATE 80 MG/ML IJ SUSP: 80.0000 mg | Freq: Once | INTRAMUSCULAR | Status: DC

## 2019-01-19 NOTE — Progress Notes (Signed)
.  Numeric Pain Rating Scale and Functional Assessment Average Pain 9   In the last MONTH (on 0-10 scale) has pain interfered with the following?  1. General activity like being  able to carry out your everyday physical activities such as walking, climbing stairs, carrying groceries, or moving a chair?  Rating(8)   +Driver, -BT, -Dye Allergies.  

## 2019-01-23 NOTE — Progress Notes (Signed)
Alicia [name] - 73 y.o. female MRN [id number]  Date of birth: 1946-09-24  Office Visit Note: Visit Date: 01/19/2019 PCP: Doretha Sou, MD Referred by: Doretha Sou*  Subjective: Chief Complaint  Patient presents with  . Right Hip - Pain  . Left Hip - Pain  . Left Thigh - Pain   HPI: Alicia [name] is a 73 y.o. female who comes in today Bilateral diagnostic medial branch blocks of the L4-5 and L5-S1 facet joints bilaterally for bilateral axial low back pain worse with standing in extension and facet loading.  Please see our prior evaluation and management note for further details and justification.  ROS Otherwise per HPI.  Assessment & Plan: Visit Diagnoses:  1. Spondylosis without myelopathy or radiculopathy, lumbar region     Plan: No additional findings.   Meds & Orders:  Meds ordered this encounter  Medications  . methylPREDNISolone acetate (DEPO-MEDROL) injection 80 mg    Orders Placed This Encounter  Procedures  . Facet Injection  . XR C-ARM NO REPORT    Follow-up: Return if symptoms worsen or fail to improve.   Procedures: No procedures performed  Lumbar Diagnostic Facet Joint Nerve Block with Fluoroscopic Guidance   Patient: Alicia [name]      Date of Birth: Jan 16, 1946 MRN: [id number] PCP: Doretha Sou, MD      Visit Date: 01/19/2019   Universal Protocol:    Date/Time: 03/16/206:29 AM  Consent Given By: the patient  Position: PRONE  Additional Comments: Vital signs were monitored before and after the procedure. Patient was prepped and draped in the usual sterile fashion. The correct patient, procedure, and site was verified.   Injection Procedure Details:  Procedure Site One Meds Administered:  Meds ordered this encounter  Medications  . methylPREDNISolone acetate (DEPO-MEDROL) injection 80 mg     Laterality: Bilateral  Location/Site: Bilateral L3 and L4 medial branches and L5  dorsal rami. L4-L5 L5-S1  Needle size: 22 ga.  Needle type:spinal  Needle Placement: Oblique pedical  Findings:   -Comments: There was excellent flow of contrast along the articular pillars without intravascular flow.  Procedure Details: The fluoroscope beam is vertically oriented in AP and then obliqued 15 to 20 degrees to the ipsilateral side of the desired nerve to achieve the "Scotty dog" appearance.  The skin over the target area of the junction of the superior articulating process and the transverse process (sacral ala if blocking the L5 dorsal rami) was locally anesthetized with a 1 ml volume of 1% Lidocaine without Epinephrine.  The spinal needle was inserted and advanced in a trajectory view down to the target.   After contact with periosteum and negative aspirate for blood and CSF, correct placement without intravascular or epidural spread was confirmed by injecting 0.5 ml. of Isovue-250.  A spot radiograph was obtained of this image.    Next, a 0.5 ml. volume of the injectate described above was injected. The needle was then redirected to the other facet joint nerves mentioned above if needed.  Prior to the procedure, the patient was given a Pain Diary which was completed for baseline measurements.  After the procedure, the patient rated their pain every 30 minutes and will continue rating at this frequency for a total of 5 hours.  The patient has been asked to complete the Diary and return to [place] by mail, fax or hand delivered as soon as possible.   Additional Comments:  The patient tolerated the procedure well Dressing: 2 x 2  sterile gauze and Band-Aid    Post-procedure details: Patient was observed during the procedure. Post-procedure instructions were reviewed.  Patient left the clinic in stable condition.   Clinical History: MRI LUMBAR SPINE WITHOUT CONTRAST  TECHNIQUE: Multiplanar, multisequence MR imaging of the lumbar spine was performed. No intravenous  contrast was administered.  COMPARISON:  09/14/2017 lumbar spine radiographs.  FINDINGS: Segmentation:  Standard.  Alignment: Mild lumbar levocurvature with apex at L3. Straightening of lumbar lordosis. No listhesis.  Vertebrae: Mild multilevel endplate degenerative edema at the L2-3 through L5-S1 levels. No evidence of discitis or fracture. 13 mm intermediate signal focus in L3 vertebral body. Subcentimeter T1/T2 hyperintense focus in L1 vertebral body, likely hemangioma.  Conus medullaris and cauda equina: Conus extends to the L1 level. Conus and cauda equina appear normal.  Paraspinal and other soft tissues: Negative.  Disc levels:  L1-2: Minimal disc bulge. No significant foraminal or canal stenosis.  L2-3: Diffuse disc bulge, endplate marginal osteophytes, and right greater than left facet/ligamentum flavum hypertrophy. Moderate right and mild left foraminal stenosis. No significant canal stenosis.  L3-4: Diffuse disc bulge, small central annular fissure, moderate bilateral facet and mild ligamentum flavum hypertrophy. Mild bilateral foraminal stenosis. No significant canal stenosis.  L4-5: Disc bulge eccentric to the left with left foraminal and extraforaminal prominent marginal osteophytes, mild right/ severe left facet hypertrophy, and left ligamentum flavum hypertrophy. Severe left foraminal stenosis. Left lateral recess narrowing with disc contact on descending left L5 nerve root.  L5-S1: Small disc bulge, bilateral foraminal and extraforaminal marginal osteophytes, and advanced facet hypertrophy. Severe bilateral foraminal stenosis. No significant canal stenosis.  IMPRESSION: 1. No acute osseous abnormality.  Mild lumbar levocurvature. 2. Indeterminate 13 mm L3 vertebral body intermediate signal focus. In the absence of primary malignancy this probably represents red marrow. If there is clinical concern for metastatic disease, consider lumbar  spine MRI with contrast or bone scan for further assessment. 3. No significant canal stenosis. 4. Multilevel mild and moderate foraminal stenosis. Severe left L4-5 and bilateral L5-S1 foraminal stenosis.   Electronically Signed   By: Kristine Garbe M.D.   On: 10/05/2017 13:51   She reports that she has been smoking. She has never used smokeless tobacco. No results for input(s): HGBA1C, LABURIC in the last 8760 hours.  Objective:  VS:  HT:    WT:   BMI:     BP:(!) 142/86  HR:83bpm  TEMP:97.7 F (36.5 C)(Oral)  RESP:  Physical Exam  Ortho Exam Imaging: No results found.  Past Medical/Family/Surgical/Social History: Medications & Allergies reviewed per EMR, new medications updated. Patient Active Problem List   Diagnosis Date Noted  . Spondylosis without myelopathy or radiculopathy, lumbar region 01/05/2019  . Foraminal stenosis of lumbar region 01/05/2019  . Arthritis 06/08/2018  . GERD (gastroesophageal reflux disease) 06/08/2018  . Macular degeneration 06/08/2018  . Sleep apnea 06/08/2018  . Trouble swallowing 06/08/2018   History reviewed. No pertinent past medical history. History reviewed. No pertinent family history. History reviewed. No pertinent surgical history. Social History   Occupational History  . Not on file  Tobacco Use  . Smoking status: Current Every Day Smoker  . Smokeless tobacco: Never Used  Substance and Sexual Activity  . Alcohol use: Not on file  . Drug use: Not on file  . Sexual activity: Not on file

## 2019-01-23 NOTE — Procedures (Signed)
Lumbar Diagnostic Facet Joint Nerve Block with Fluoroscopic Guidance   Patient: Alicia [name]      Date of Birth: [birth year] MRN: [id number] PCP: Doretha Sou, MD      Visit Date: 01/19/2019   Universal Protocol:    Date/Time: 03/16/206:29 AM  Consent Given By: the patient  Position: PRONE  Additional Comments: Vital signs were monitored before and after the procedure. Patient was prepped and draped in the usual sterile fashion. The correct patient, procedure, and site was verified.   Injection Procedure Details:  Procedure Site One Meds Administered:  Meds ordered this encounter  Medications  . methylPREDNISolone acetate (DEPO-MEDROL) injection 80 mg     Laterality: Bilateral  Location/Site: Bilateral L3 and L4 medial branches and L5 dorsal rami. L4-L5 L5-S1  Needle size: 22 ga.  Needle type:spinal  Needle Placement: Oblique pedical  Findings:   -Comments: There was excellent flow of contrast along the articular pillars without intravascular flow.  Procedure Details: The fluoroscope beam is vertically oriented in AP and then obliqued 15 to 20 degrees to the ipsilateral side of the desired nerve to achieve the "Scotty dog" appearance.  The skin over the target area of the junction of the superior articulating process and the transverse process (sacral ala if blocking the L5 dorsal rami) was locally anesthetized with a 1 ml volume of 1% Lidocaine without Epinephrine.  The spinal needle was inserted and advanced in a trajectory view down to the target.   After contact with periosteum and negative aspirate for blood and CSF, correct placement without intravascular or epidural spread was confirmed by injecting 0.5 ml. of Isovue-250.  A spot radiograph was obtained of this image.    Next, a 0.5 ml. volume of the injectate described above was injected. The needle was then redirected to the other facet joint nerves mentioned above if needed.  Prior to  the procedure, the patient was given a Pain Diary which was completed for baseline measurements.  After the procedure, the patient rated their pain every 30 minutes and will continue rating at this frequency for a total of 5 hours.  The patient has been asked to complete the Diary and return to [place] by mail, fax or hand delivered as soon as possible.   Additional Comments:  The patient tolerated the procedure well Dressing: 2 x 2 sterile gauze and Band-Aid    Post-procedure details: Patient was observed during the procedure. Post-procedure instructions were reviewed.  Patient left the clinic in stable condition.

## 2019-01-27 ENCOUNTER — Other Ambulatory Visit (INDEPENDENT_AMBULATORY_CARE_PROVIDER_SITE_OTHER): Payer: Self-pay | Admitting: Physical Medicine and Rehabilitation

## 2019-01-30 NOTE — Telephone Encounter (Signed)
Please advise 

## 2019-02-15 ENCOUNTER — Telehealth (INDEPENDENT_AMBULATORY_CARE_PROVIDER_SITE_OTHER): Payer: Self-pay | Admitting: Orthopedic Surgery

## 2019-02-15 NOTE — Telephone Encounter (Signed)
See message below  °Please Advise. °

## 2019-02-15 NOTE — Telephone Encounter (Signed)
Patient states she's still having shoulder/neck pain and wants to go ahead with the neck injection. Patient wasn't sure if the appointment needed to be with Dr Marlou Sa or Dr Ernestina Patches. Please advise.

## 2019-02-15 NOTE — Telephone Encounter (Signed)
Please advise. Do you want to see her first or just refer to Dr Ernestina Patches?

## 2019-02-15 NOTE — Telephone Encounter (Signed)
Please refer to Dr. Ernestina Patches thank you

## 2019-02-15 NOTE — Telephone Encounter (Signed)
IC patient advised per Dr Marlou Sa ok to see Dr Ernestina Patches for her neck since he already is treating her neck.  She states that her neck is killing her.  I did advise her that he was out of the office this week but would forward message to you.  I let her know that he may or may not do an OV but may do telemedicine visit.  Can you please call her to discuss I was not sure if Dr Ernestina Patches was checking messages any while he was out. Thanks.

## 2019-02-20 NOTE — Telephone Encounter (Signed)
Called pt and offered her an ov and she states she is using lidocaine cream and that is helping with pain. Pt states she will call back once pain starts to bother her again.

## 2019-02-20 NOTE — Telephone Encounter (Signed)
Need to set her up for OV - telemedicine or phone call

## 2019-03-30 ENCOUNTER — Other Ambulatory Visit (INDEPENDENT_AMBULATORY_CARE_PROVIDER_SITE_OTHER): Payer: Self-pay | Admitting: Physical Medicine and Rehabilitation

## 2019-03-30 NOTE — Telephone Encounter (Signed)
Please advise 

## 2019-04-17 ENCOUNTER — Telehealth: Payer: Self-pay | Admitting: Physical Medicine and Rehabilitation

## 2019-04-17 NOTE — Telephone Encounter (Signed)
If right groin pain after fall then better eval from Dr. Marlou Sa if possible and may be sooner than I can?

## 2019-04-17 NOTE — Telephone Encounter (Signed)
Scheduled with Dr. Marlou Sa on 6/12.

## 2019-04-21 ENCOUNTER — Other Ambulatory Visit: Payer: Self-pay

## 2019-04-21 ENCOUNTER — Ambulatory Visit (HOSPITAL_COMMUNITY)
Admission: RE | Admit: 2019-04-21 | Discharge: 2019-04-21 | Disposition: A | Discharge status: Home / Self Care | Payer: Medicare Other | Source: Ambulatory Visit | Attending: Orthopedic Surgery | Admitting: Orthopedic Surgery

## 2019-04-21 ENCOUNTER — Ambulatory Visit (INDEPENDENT_AMBULATORY_CARE_PROVIDER_SITE_OTHER): Payer: Medicare Other | Admitting: Orthopedic Surgery

## 2019-04-21 ENCOUNTER — Encounter: Payer: Self-pay | Admitting: Orthopedic Surgery

## 2019-04-21 ENCOUNTER — Ambulatory Visit (INDEPENDENT_AMBULATORY_CARE_PROVIDER_SITE_OTHER): Payer: Medicare Other

## 2019-04-21 VITALS — Ht 61.5 in | Wt 230.0 lb

## 2019-04-21 DIAGNOSIS — M25551 Pain in right hip: Secondary | ICD-10-CM

## 2019-04-21 MED ORDER — DIAZEPAM 5 MG PO TABS: ORAL_TABLET | ORAL | 0 refills | Status: DC

## 2019-04-22 NOTE — Progress Notes (Signed)
I called her just now -  -ok for wbat with walker no surgery indicated

## 2019-04-25 ENCOUNTER — Encounter: Payer: Self-pay | Admitting: Orthopedic Surgery

## 2019-04-25 NOTE — Progress Notes (Signed)
Office Visit Note   Patient: Alicia [name]           Date of Birth: 09-30-1946           MRN: [id number] Visit Date: 04/21/2019 Requested by: Alicia [name], [address] [place],  [place] [address] PCP: Alicia Sou, MD  Subjective: Chief Complaint  Patient presents with  . Right Hip - Pain    Fall 04/09/2019    HPI: Alicia [name] is a patient with right groin pain.  She fell in the mud 04/09/2019.  She did not have pain at first but then she reported increased pain in the right groin region and has developed significant difficulty with walking.  She does have a history of right hip injection with Dr. Ernestina [name] in the past.  She also injured her right shoulder at the time but her shoulder has recovered nicely and currently is asymptomatic.  The pain radiates down to the knee but not below the knee.  She does report pain with weightbearing which is worse than pain at rest             ROS: All systems reviewed are negative as they relate to the chief complaint within the history of present illness.  Patient denies  fevers or chills.   Assessment & Plan: Visit Diagnoses:  1. Pain in right hip     Plan: Impression is right groin pain with normal radiographs but irritable hip on exam.  Plan is MRI scan of the right hip to evaluate for occult fracture.  I do want her to be very careful with weightbearing until we can rule out occult femoral neck fracture or intertrochanteric fracture.  I will call her with the results of the MRI scan which will be done the day after her clinic appointment.  Addendum: At the time of this dictation her scan did come back as showing nondisplaced pubic rami fractures.  I did discuss this with her and told her that it would likely take about 3-4 more weeks for this to improve but that she could be weightbearing as tolerated on that right-hand side.  Encouraged her to use a walker.  Plan to see her back in 3 to 4 weeks if she remains  symptomatic but anticipate that this should be a self-limited problem.  Follow-Up Instructions: Return if symptoms worsen or fail to improve.   Orders:  Orders Placed This Encounter  Procedures  . XR HIP UNILAT W OR W/O PELVIS 2-3 VIEWS RIGHT  . MR Hip Right w/o contrast   Meds ordered this encounter  Medications  . diazepam (VALIUM) 5 MG tablet    Sig: Take 10mg  (2 tabs) 20 minutes prior to procedure.    Dispense:  2 tablet    Refill:  0      Procedures: No procedures performed   Clinical Data: No additional findings.  Objective: Vital Signs: Ht 5' 1.5" (1.562 m)   Wt 230 lb (104.3 kg)   BMI 42.75 kg/m   Physical Exam:   Constitutional: Patient appears well-developed HEENT:  Head: Normocephalic Eyes:EOM are normal Neck: Normal range of motion Cardiovascular: Normal rate Pulmonary/chest: Effort normal Neurologic: Patient is alert Skin: Skin is warm Psychiatric: Patient has normal mood and affect    Ortho Exam: Ortho exam demonstrates antalgic gait to the right.  No leg length discrepancy.  Pedal pulses palpable.  No bruising in the right hip region.  No discrete trochanteric tenderness and no nerve root tension signs.  Does have a little bit of pain with hip flexion and adduction on the right compared to the left.  Mild groin pain with internal X rotation on the right compared to the left.  Specialty Comments:  No specialty comments available.  Imaging: No results found.   PMFS History: Patient Active Problem List   Diagnosis Date Noted  . Spondylosis without myelopathy or radiculopathy, lumbar region 01/05/2019  . Foraminal stenosis of lumbar region 01/05/2019  . Arthritis 06/08/2018  . GERD (gastroesophageal reflux disease) 06/08/2018  . Macular degeneration 06/08/2018  . Sleep apnea 06/08/2018  . Trouble swallowing 06/08/2018   History reviewed. No pertinent past medical history.  History reviewed. No pertinent family history.  History reviewed.  No pertinent surgical history. Social History   Occupational History  . Not on file  Tobacco Use  . Smoking status: Current Every Day Smoker  . Smokeless tobacco: Never Used  Substance and Sexual Activity  . Alcohol use: Not on file  . Drug use: Not on file  . Sexual activity: Not on file

## 2019-04-26 ENCOUNTER — Telehealth: Payer: Self-pay | Admitting: *Deleted

## 2019-04-26 NOTE — Telephone Encounter (Signed)
Dr. Marlou Sa needs to go over Mri with her since it was a hip MRI and he ordered, I'm sure from his notes he would like to follow up with her - given the results.

## 2019-04-27 NOTE — Telephone Encounter (Signed)
Pt states Dr. Marlou Sa has went over MRI results with her and that she would like to have another right hip (groin pain) injection. Last hip injection August 2019 and she states it gave her 80% of relief. Please Advise.

## 2019-04-27 NOTE — Telephone Encounter (Signed)
Go ahead and get her scheduled but I need to speak with Dr. Marlou Sa if "should" do it witht he fracture noted on MRI, please let her know I will speak with him.

## 2019-04-27 NOTE — Telephone Encounter (Signed)
Pt is scheduled for rt hip inj 05/05/2019.

## 2019-05-05 ENCOUNTER — Other Ambulatory Visit: Payer: Self-pay

## 2019-05-05 ENCOUNTER — Ambulatory Visit (INDEPENDENT_AMBULATORY_CARE_PROVIDER_SITE_OTHER): Payer: Medicare Other | Admitting: Physical Medicine and Rehabilitation

## 2019-05-05 ENCOUNTER — Encounter: Payer: Self-pay | Admitting: Physical Medicine and Rehabilitation

## 2019-05-05 ENCOUNTER — Ambulatory Visit: Payer: Self-pay

## 2019-05-05 DIAGNOSIS — M25551 Pain in right hip: Secondary | ICD-10-CM | POA: Diagnosis not present

## 2019-05-05 MED ORDER — GABAPENTIN 100 MG PO CAPS: ORAL_CAPSULE | ORAL | 0 refills | Status: DC

## 2019-05-05 NOTE — Progress Notes (Signed)
Left lower leg wound, gabapentin, tylenol  Numeric Pain Rating Scale and Functional Assessment Average Pain 10   In the last MONTH (on 0-10 scale) has pain interfered with the following?  1. General activity like being  able to carry out your everyday physical activities such as walking, climbing stairs, carrying groceries, or moving a chair?  Rating(6)    +Driver, +BT (asprin), -Dye Allergies.

## 2019-05-05 NOTE — Progress Notes (Signed)
Alicia [name] - 73 y.o. female MRN [id number]  Date of birth: 1946/01/19  Office Visit Note: Visit Date: 05/05/2019 PCP: Doretha Sou, MD Referred by: Doretha Sou*  Subjective: Chief Complaint  Patient presents with  . Right Hip - Pain   HPI: Alicia [name] is a 73 y.o. female who comes in today At the request of G. Alphonzo Severance for right intra-articular hip injection.  I have seen the patient in the past for her lumbar spine.  She has had MRI of the hip since I have seen her and this is been reviewed with her by Dr. Marlou Sa.  I did review it below.  She reports hip and groin pain radiating to the knee.  Really only able to lay on her back to sleep to get any relief.  She will continue to follow-up with Dr. Marlou Sa for her orthopedic care.  She did show me today where she had had a minor fall with scraping of the skin on the left lower shin.  She had a dry dressing that she says she has been changing frequently.  She did want me to look at this.  I did look at the wound there was healing and eschar no sign of infection or redness or swelling or induration.  She is doing a pretty good job with wound dressing and this should heal.  She should also show this to Dr. Marlou Sa the next time she sees him if is anytime soon.  We talked today about starting gabapentin as a trial that may help her at night with some sleep but also may be something in the future for her nerve type pain.  We will start that as a slow tapering and I discussed this at length with her.  She will continue with Tylenol 3 times a day..  She rates her pain is 10 out of 10.  ROS Otherwise per HPI.  Assessment & Plan: Visit Diagnoses:  1. Pain in right hip     Plan: Findings:  Patient did get relief during the anesthetic phase of the injection.    Meds & Orders:  Meds ordered this encounter  Medications  . gabapentin (NEURONTIN) 100 MG capsule    Sig: Take 1 capsule by mouth at night for 7  nights and then 1 in the morning and one at night for 7 days and then 3 times a day.    Dispense:  90 capsule    Refill:  0    Orders Placed This Encounter  Procedures  . Large Joint Inj: R hip joint  . XR C-ARM NO REPORT    Follow-up: Return if symptoms worsen or fail to improve.   Procedures: Large Joint Inj: R hip joint on 05/05/2019 9:08 AM Indications: diagnostic evaluation and pain Details: 22 G 3.5 in needle, fluoroscopy-guided anterior approach  Arthrogram: No  Medications: 80 mg triamcinolone acetonide 40 MG/ML; 4 mL bupivacaine 0.25 % Outcome: tolerated well, no immediate complications  There was excellent flow of contrast producing a partial arthrogram of the hip. The patient did have relief of symptoms during the anesthetic phase of the injection. Procedure, treatment alternatives, risks and benefits explained, specific risks discussed. Consent was given by the patient. Immediately prior to procedure a time out was called to verify the correct patient, procedure, equipment, support staff and site/side marked as required. Patient was prepped and draped in the usual sterile fashion.      No notes on file   Clinical History: MR  OF THE RIGHT HIP WITHOUT CONTRAST  TECHNIQUE: Multiplanar, multisequence MR imaging was performed. No intravenous contrast was administered.  COMPARISON:  Radiographs from 04/21/2019  FINDINGS: Bones: Degenerative endplate findings in the lumbar spine at L4-5 and L5-S1 with lumbar spondylosis and degenerative disc disease along with mild levoconvex lumbar scoliotic curvature.  Subtle marrow edema laterally in the superior pubic ramus and more medially in the inferior pubic ramus suspicious for nondisplaced fractures. I do not see concomitant marrow edema in the visualized part of the sacrum but much of the sacrum was excluded.  Articular cartilage and labrum  Articular cartilage:  Unremarkable  Labrum:  Grossly unremarkable   Joint or bursal effusion  Joint effusion:  Absent  Bursae: Trace right iliopsoas bursitis.  Muscles and tendons  Muscles and tendons: There was abnormal edema in the right hip adductor musculature, and tracking along the anteromedial margin of the right obturator internus.  Likely chronic partial tearing right hamstring tendon.  Other findings  Miscellaneous:   Endometrial thickness 8 mm.  IMPRESSION: 1. Subtle nondisplaced fractures of the lateral portion of the right superior pubic ramus and medial portion of the right inferior pubic ramus. I do not see a concomitant fracture in the visualized portion of the sacrum although not all the sacrum was included. There is associated abnormal edema in the right hip adductor musculature. 2. Likely chronic partial tearing of the right hamstring tendon with a small amount of fluid signal in this vicinity. 3. Lower lumbar spondylosis, degenerative disc disease, and scoliosis. 4. Trace right iliopsoas bursitis. 5. Endometrial thickness is 8 mm which is at the upper limits normal for an asymptomatic postmenopausal woman. If the patient is experiencing any vaginal bleeding then sonographic assessment of the uterus would be recommended.   Electronically Signed   By: Van Clines M.D.   On: 04/21/2019 18:11   MRI LUMBAR SPINE WITHOUT CONTRAST  Disc levels:  L1-2: Minimal disc bulge. No significant foraminal or canal stenosis.  L2-3: Diffuse disc bulge, endplate marginal osteophytes, and right greater than left facet/ligamentum flavum hypertrophy. Moderate right and mild left foraminal stenosis. No significant canal stenosis.  L3-4: Diffuse disc bulge, small central annular fissure, moderate bilateral facet and mild ligamentum flavum hypertrophy. Mild bilateral foraminal stenosis. No significant canal stenosis.  L4-5: Disc bulge eccentric to the left with left foraminal and extraforaminal prominent  marginal osteophytes, mild right/ severe left facet hypertrophy, and left ligamentum flavum hypertrophy. Severe left foraminal stenosis. Left lateral recess narrowing with disc contact on descending left L5 nerve root.  L5-S1: Small disc bulge, bilateral foraminal and extraforaminal marginal osteophytes, and advanced facet hypertrophy. Severe bilateral foraminal stenosis. No significant canal stenosis.  IMPRESSION: 1. No acute osseous abnormality. Mild lumbar levocurvature. 2. Indeterminate 13 mm L3 vertebral body intermediate signal focus. In the absence of primary malignancy this probably represents red marrow. If there is clinical concern for metastatic disease, consider lumbar spine MRI with contrast or bone scan for further assessment. 3. No significant canal stenosis. 4. Multilevel mild and moderate foraminal stenosis. Severe left L4-5 and bilateral L5-S1 foraminal stenosis.   Electronically Signed By: Kristine Garbe M.D. On: 10/05/2017 13:51   She reports that she has been smoking. She has never used smokeless tobacco. No results for input(s): HGBA1C, LABURIC in the last 8760 hours.  Objective:  VS:  HT:    WT:   BMI:     BP:   HR: bpm  TEMP: ( )  RESP:  Physical Exam  Ortho  Exam Imaging: No results found.  Past Medical/Family/Surgical/Social History: Medications & Allergies reviewed per EMR, new medications updated. Patient Active Problem List   Diagnosis Date Noted  . Spondylosis without myelopathy or radiculopathy, lumbar region 01/05/2019  . Foraminal stenosis of lumbar region 01/05/2019  . Arthritis 06/08/2018  . GERD (gastroesophageal reflux disease) 06/08/2018  . Macular degeneration 06/08/2018  . Sleep apnea 06/08/2018  . Trouble swallowing 06/08/2018   History reviewed. No pertinent past medical history. History reviewed. No pertinent family history. History reviewed. No pertinent surgical history. Social History   Occupational  History  . Not on file  Tobacco Use  . Smoking status: Current Every Day Smoker  . Smokeless tobacco: Never Used  Substance and Sexual Activity  . Alcohol use: Not on file  . Drug use: Not on file  . Sexual activity: Not on file

## 2019-05-08 MED ORDER — TRIAMCINOLONE ACETONIDE 40 MG/ML IJ SUSP
80.0000 mg | INTRAMUSCULAR | Status: AC | PRN
  Administered 2019-05-05: 09:00:00 80 mg via INTRA_ARTICULAR

## 2019-05-08 MED ORDER — BUPIVACAINE HCL 0.25 % IJ SOLN
4.0000 mL | INTRAMUSCULAR | Status: AC | PRN
  Administered 2019-05-05: 4 mL via INTRA_ARTICULAR

## 2019-05-11 ENCOUNTER — Telehealth: Payer: Self-pay | Admitting: Physical Medicine and Rehabilitation

## 2019-05-11 NOTE — Telephone Encounter (Signed)
Scheduled for an OV with Dr. Marlou Sa on 7/8.

## 2019-05-11 NOTE — Telephone Encounter (Signed)
She has those small non-displaced fractures that Dr. Marlou Sa told her about on MRI. We could try one time illiopsoas injection versus Epidural injection OR f/up with Marlou Sa

## 2019-05-17 ENCOUNTER — Other Ambulatory Visit: Payer: Self-pay

## 2019-05-17 ENCOUNTER — Ambulatory Visit (INDEPENDENT_AMBULATORY_CARE_PROVIDER_SITE_OTHER): Payer: Medicare Other | Admitting: Orthopedic Surgery

## 2019-05-17 ENCOUNTER — Ambulatory Visit: Payer: Self-pay

## 2019-05-17 ENCOUNTER — Encounter: Payer: Self-pay | Admitting: Orthopedic Surgery

## 2019-05-17 ENCOUNTER — Ambulatory Visit (INDEPENDENT_AMBULATORY_CARE_PROVIDER_SITE_OTHER): Payer: Medicare Other

## 2019-05-17 DIAGNOSIS — M5410 Radiculopathy, site unspecified: Secondary | ICD-10-CM

## 2019-05-17 DIAGNOSIS — M25551 Pain in right hip: Secondary | ICD-10-CM

## 2019-05-17 NOTE — Progress Notes (Signed)
Office Visit Note   Patient: Alicia [name]           Date of Birth: 15-Aug-1946           MRN: [id number] Visit Date: 05/17/2019 Requested by: Doretha Sou, [address],  [place] [address] PCP: Doretha Sou, MD  Subjective: Chief Complaint  Patient presents with  . Right Hip - Pain   [name] is a patient with right hip pain.  She had right hip injected by Dr. Ernestina Patches on 626 which gave her 1 hour of relief.  She reports continued groin pain as well as back pain.  She feels like something is out of place.  She has difficulty walking.  Reports pain in the coccyx as well.  Not taking much in way of medicine for the pain.  She is on vitamin D chronically.  She is actually in a wheelchair. HPI: See above              ROS: All systems reviewed are negative as they relate to the chief complaint within the history of present illness.  Patient denies  fevers or chills.   Assessment & Plan: Visit Diagnoses:  1. Pain in right hip   2. Radicular pain     Plan: Impression is right hip rami fractures which show fairly minimal evidence of healing but she is actually getting worse in the 4 weeks that she is had the MRI scan.  I would expect rami fractures to be getting better by now.  I think she may have a component of a back issue as well.  Radiographs difficult to interpret because of scoliosis deformity and significant arthritis.  She might have something going on in terms of either compression fracture or nerve root compression from the lumbar spine.  Sacral fracture also a possibility.  I will see her back after her MRI scan of her lumbar spine.  Follow-Up Instructions: Return for after MRI.   Orders:  Orders Placed This Encounter  Procedures  . XR Lumbar Spine 2-3 Views  . XR HIP UNILAT W OR W/O PELVIS 2-3 VIEWS RIGHT  . MR Lumbar Spine w/o contrast   No orders of the defined types were placed in this encounter.     Procedures: No  procedures performed   Clinical Data: No additional findings.  Objective: Vital Signs: There were no vitals taken for this visit.  Physical Exam:   Constitutional: Patient appears well-developed HEENT:  Head: Normocephalic Eyes:EOM are normal Neck: Normal range of motion Cardiovascular: Normal rate Pulmonary/chest: Effort normal Neurologic: Patient is alert Skin: Skin is warm Psychiatric: Patient has normal mood and affect    Ortho Exam: Ortho exam demonstrates not much pain with internal X rotation of that right leg.  She has good hip flexion abduction adduction strength.  Does have mildly positive nerve root tension signs on the right negative on the left.  No definite paresthesias L1 S1.  Pedal pulses palpable.  Does have pain with weightbearing.  Also has tenderness to palpation in the sacroiliac region.  Specialty Comments:  No specialty comments available.  Imaging: Xr Hip Unilat W Or W/o Pelvis 2-3 Views Right  Result Date: 05/17/2019 AP pelvis lateral right hip reviewed.  Minimal healing of inferior and superior pubic rami fractures is present.  Hip itself is intact with no evidence of fracture or arthritis.    PMFS History: Patient Active Problem List   Diagnosis Date Noted  . Spondylosis without myelopathy  or radiculopathy, lumbar region 01/05/2019  . Foraminal stenosis of lumbar region 01/05/2019  . Arthritis 06/08/2018  . GERD (gastroesophageal reflux disease) 06/08/2018  . Macular degeneration 06/08/2018  . Sleep apnea 06/08/2018  . Trouble swallowing 06/08/2018   History reviewed. No pertinent past medical history.  History reviewed. No pertinent family history.  History reviewed. No pertinent surgical history. Social History   Occupational History  . Not on file  Tobacco Use  . Smoking status: Current Every Day Smoker  . Smokeless tobacco: Never Used  Substance and Sexual Activity  . Alcohol use: Not on file  . Drug use: Not on file  . Sexual  activity: Not on file

## 2019-05-22 ENCOUNTER — Other Ambulatory Visit: Payer: Self-pay | Admitting: Obstetrics & Gynecology

## 2019-05-22 ENCOUNTER — Other Ambulatory Visit: Payer: Self-pay

## 2019-05-22 DIAGNOSIS — Z1231 Encounter for screening mammogram for malignant neoplasm of breast: Secondary | ICD-10-CM

## 2019-05-27 ENCOUNTER — Other Ambulatory Visit: Payer: Self-pay | Admitting: Physical Medicine and Rehabilitation

## 2019-05-29 NOTE — Telephone Encounter (Signed)
Please advise 

## 2019-06-07 ENCOUNTER — Telehealth: Payer: Self-pay | Admitting: *Deleted

## 2019-06-07 MED ORDER — GABAPENTIN 100 MG PO CAPS: 100.0000 mg | ORAL_CAPSULE | Freq: Three times a day (TID) | ORAL | 3 refills | Status: DC

## 2019-06-07 NOTE — Telephone Encounter (Signed)
Ok but goal would be to slowly work up to 300 TID over time. I will do 100 mg but we can see her at some point depending on that is going

## 2019-06-07 NOTE — Telephone Encounter (Signed)
Changed gabapentin to 100mg  TID but with goal to slowly get to 300 TID if possible

## 2019-06-08 ENCOUNTER — Telehealth: Payer: Self-pay | Admitting: Orthopedic Surgery

## 2019-06-08 NOTE — Telephone Encounter (Signed)
Called pt and advised, she states she will call back if the medication does not help her.

## 2019-06-08 NOTE — Telephone Encounter (Signed)
Patient called to let Dr Marlou Sa know that she went to [facility] on 06/05/2019 and had an ultrasound and a 5 millimeter growth was found in her uterus. Patient  Alicia [name] she was was told that some of the pain was coming from the growth. Patient said she is being referred to Dr. Ulanda Edison. Patient said she still want to have the MRI. The number to contact patient is [phone]

## 2019-06-08 NOTE — Telephone Encounter (Signed)
fyi

## 2019-06-23 ENCOUNTER — Other Ambulatory Visit: Payer: Self-pay

## 2019-06-23 ENCOUNTER — Ambulatory Visit
Admission: RE | Admit: 2019-06-23 | Discharge: 2019-06-23 | Disposition: A | Discharge status: Home / Self Care | Payer: Medicare Other | Source: Ambulatory Visit | Attending: Orthopedic Surgery | Admitting: Orthopedic Surgery

## 2019-06-23 DIAGNOSIS — M5410 Radiculopathy, site unspecified: Secondary | ICD-10-CM

## 2019-06-28 ENCOUNTER — Other Ambulatory Visit: Payer: Self-pay

## 2019-06-28 ENCOUNTER — Ambulatory Visit (INDEPENDENT_AMBULATORY_CARE_PROVIDER_SITE_OTHER): Payer: Medicare Other | Admitting: Orthopedic Surgery

## 2019-06-28 ENCOUNTER — Encounter: Payer: Self-pay | Admitting: Orthopedic Surgery

## 2019-06-28 DIAGNOSIS — M8448XA Pathological fracture, other site, initial encounter for fracture: Secondary | ICD-10-CM

## 2019-06-28 NOTE — Progress Notes (Signed)
   Office Visit Note   Patient: Alicia [name]           Date of Birth: 1946/06/10           MRN: [id number] Visit Date: 06/28/2019 Requested by: Doretha Sou, [place],  [place] [address] PCP: Doretha Sou, MD  Subjective: Chief Complaint  Patient presents with  . Follow-up    HPI: Alicia [name] is a patient with low back pain.  Since have seen her she has had an MRI scan which is reviewed.  She does have sacral insufficiency fracture both sides.  I reviewed the scan with her.  She is on vitamin D.  She has some type of surgery coming up in her pelvic region.  Taking Tylenol and Neurontin for her symptoms.  Has not really had a period of limited weightbearing.              ROS: All systems reviewed are negative as they relate to the chief complaint within the history of present illness.  Patient denies  fevers or chills.   Assessment & Plan: Visit Diagnoses:  1. Sacral insufficiency fracture, initial encounter     Plan: Impression is sacral insufficiency fracture as a cause of her weightbearing pain.  She really needs to have a very limited amount of weightbearing in order for this to get better.  I do not think percutaneous fixation indicated at this time but that could be considered if she does not improve with activity modification.  Come back in 6 weeks for clinical recheck.  She will of had her surgery since then.  Follow-Up Instructions: Return in about 6 weeks (around 08/09/2019).   Orders:  No orders of the defined types were placed in this encounter.  No orders of the defined types were placed in this encounter.     Procedures: No procedures performed   Clinical Data: No additional findings.  Objective: Vital Signs: There were no vitals taken for this visit.  Physical Exam:   Constitutional: Patient appears well-developed HEENT:  Head: Normocephalic Eyes:EOM are normal Neck: Normal range of motion Cardiovascular:  Normal rate Pulmonary/chest: Effort normal Neurologic: Patient is alert Skin: Skin is warm Psychiatric: Patient has normal mood and affect    Ortho Exam: Ortho exam demonstrates difficulty from seated to standing.  No nerve root tension signs.  Patient has good ankle dorsiflexion plantarflexion quad hamstring strength with no groin pain with internal/external rotation.  No other masses lymphadenopathy or skin changes noted in that pelvic region or back region.  Specialty Comments:  No specialty comments available.  Imaging: No results found.   PMFS History: Patient Active Problem List   Diagnosis Date Noted  . Spondylosis without myelopathy or radiculopathy, lumbar region 01/05/2019  . Foraminal stenosis of lumbar region 01/05/2019  . Arthritis 06/08/2018  . GERD (gastroesophageal reflux disease) 06/08/2018  . Macular degeneration 06/08/2018  . Sleep apnea 06/08/2018  . Trouble swallowing 06/08/2018   History reviewed. No pertinent past medical history.  History reviewed. No pertinent family history.  History reviewed. No pertinent surgical history. Social History   Occupational History  . Not on file  Tobacco Use  . Smoking status: Current Every Day Smoker  . Smokeless tobacco: Never Used  Substance and Sexual Activity  . Alcohol use: Not on file  . Drug use: Not on file  . Sexual activity: Not on file

## 2019-07-06 ENCOUNTER — Other Ambulatory Visit: Payer: Self-pay

## 2019-07-06 ENCOUNTER — Ambulatory Visit
Admission: RE | Admit: 2019-07-06 | Discharge: 2019-07-06 | Disposition: A | Discharge status: Home / Self Care | Payer: Medicare Other | Source: Ambulatory Visit | Attending: Obstetrics & Gynecology | Admitting: Obstetrics & Gynecology

## 2019-07-06 DIAGNOSIS — Z1231 Encounter for screening mammogram for malignant neoplasm of breast: Secondary | ICD-10-CM

## 2019-08-09 ENCOUNTER — Encounter: Payer: Self-pay | Admitting: Orthopedic Surgery

## 2019-08-09 ENCOUNTER — Other Ambulatory Visit: Payer: Self-pay

## 2019-08-09 ENCOUNTER — Ambulatory Visit (INDEPENDENT_AMBULATORY_CARE_PROVIDER_SITE_OTHER): Payer: Medicare Other | Admitting: Orthopedic Surgery

## 2019-08-09 DIAGNOSIS — M542 Cervicalgia: Secondary | ICD-10-CM

## 2019-08-09 NOTE — Progress Notes (Signed)
   Post-Op Visit Note   Patient: Alicia [name]           Date of Birth: July 10, 1946           MRN: [id number] Visit Date: 08/09/2019 PCP: Doretha Sou, MD   Assessment & Plan:  Chief Complaint:  Chief Complaint  Patient presents with  . Follow-up   Visit Diagnoses:  1. Neck pain     Plan: Alicia [name] presents for follow-up bilateral sacral insufficiency fractures.  She is doing some better.  Since have seen her she is had a hysterectomy and she is doing well with that.  At times she feels some pain in their help and pelvic area.  She is on 4000 units of vitamin the day.  No longer anemic.  Still mild tenderness to palpation of the sacroiliac joints bilaterally.  No nerve root tension signs.  Plan at this time is to refer to Dr. Ernestina Patches for C-spine ESI.  She is having some recurrent right radiculopathy.  He is injected her in the past for that.  I will see her back as needed for these insufficiency fractures.  Very gradual increase in activity indicated but if she starts having pain she needs to back off.  Follow-up with me as needed  Follow-Up Instructions: No follow-ups on file.   Orders:  Orders Placed This Encounter  Procedures  . Ambulatory referral to Physical Medicine Rehab   No orders of the defined types were placed in this encounter.   Imaging: No results found.  PMFS History: Patient Active Problem List   Diagnosis Date Noted  . Spondylosis without myelopathy or radiculopathy, lumbar region 01/05/2019  . Foraminal stenosis of lumbar region 01/05/2019  . Arthritis 06/08/2018  . GERD (gastroesophageal reflux disease) 06/08/2018  . Macular degeneration 06/08/2018  . Sleep apnea 06/08/2018  . Trouble swallowing 06/08/2018   No past medical history on file.  No family history on file.  No past surgical history on file. Social History   Occupational History  . Not on file  Tobacco Use  . Smoking status: Current Every Day Smoker  . Smokeless  tobacco: Never Used  Substance and Sexual Activity  . Alcohol use: Not on file  . Drug use: Not on file  . Sexual activity: Not on file

## 2019-08-14 ENCOUNTER — Other Ambulatory Visit: Payer: Self-pay

## 2019-08-14 DIAGNOSIS — M542 Cervicalgia: Secondary | ICD-10-CM

## 2019-08-23 ENCOUNTER — Ambulatory Visit (HOSPITAL_COMMUNITY)
Admission: RE | Admit: 2019-08-23 | Discharge: 2019-08-23 | Disposition: A | Discharge status: Home / Self Care | Payer: Medicare Other | Source: Ambulatory Visit | Attending: Orthopedic Surgery | Admitting: Orthopedic Surgery

## 2019-08-23 ENCOUNTER — Other Ambulatory Visit: Payer: Self-pay

## 2019-08-23 DIAGNOSIS — M542 Cervicalgia: Secondary | ICD-10-CM | POA: Insufficient documentation

## 2019-08-29 ENCOUNTER — Other Ambulatory Visit: Payer: Self-pay | Admitting: Physical Medicine and Rehabilitation

## 2019-08-29 NOTE — Telephone Encounter (Signed)
Please advise 

## 2019-09-13 ENCOUNTER — Other Ambulatory Visit: Payer: Self-pay

## 2019-09-13 ENCOUNTER — Ambulatory Visit (INDEPENDENT_AMBULATORY_CARE_PROVIDER_SITE_OTHER): Payer: Medicare Other

## 2019-09-13 ENCOUNTER — Telehealth: Payer: Self-pay

## 2019-09-13 ENCOUNTER — Ambulatory Visit (INDEPENDENT_AMBULATORY_CARE_PROVIDER_SITE_OTHER): Payer: Medicare Other | Admitting: Orthopedic Surgery

## 2019-09-13 ENCOUNTER — Encounter: Payer: Self-pay | Admitting: Orthopedic Surgery

## 2019-09-13 VITALS — Wt 203.0 lb

## 2019-09-13 DIAGNOSIS — M25572 Pain in left ankle and joints of left foot: Secondary | ICD-10-CM

## 2019-09-13 NOTE — Telephone Encounter (Signed)
Please schedule patient an appointment with Dr Sharol Given for left leg wound referred by Dr Marlou Sa

## 2019-09-13 NOTE — Progress Notes (Signed)
Office Visit Note   Patient: Alicia [name]           Date of Birth: 24-Nov-1945           MRN: [id number] Visit Date: 09/13/2019 Requested by: Doretha Sou, [place],  [place] [address] PCP: Doretha Sou, MD  Subjective: Chief Complaint  Patient presents with  . Follow-up    HPI: Alicia [name] is a patient with right shoulder pain.  She reports throbbing in the shoulder and symptoms in the biceps area.  Does radiate down to the fingers.  Primarily pain symptoms but no numbness and tingling.  She also reports little bit of weakness in that right arm.  She also describes that she has a lesion on the distal left tibia which is not healing.  Had an injury in May which gave her significant loss of tissue over a 2 x 2 square centimeter area.  That traumatic skin lesion has not healed.  She is not diabetic.              ROS: All systems reviewed are negative as they relate to the chief complaint within the history of present illness.  Patient denies  fevers or chills.   Assessment & Plan: Visit Diagnoses:  1. Pain in left ankle and joints of left foot     Plan: Impression is nonhealing wound left tibia with no evidence of osteomyelitis on plain radiographs.  After 5 months out of expected not to see acute granulation tissue.  Like her to see Dr. Sharol Given for evaluation of the wound management.  She does not really look like she has a lot of venous stasis.  No evidence of osteomyelitis on the tibia on plain radiographs but that would be a consideration and I suggested an MRI scan of the tibia today but she does not want "another MRI scan".  I do think she needs management suggestions from Dr. Sharol Given regarding best way to get this to heal.  Regarding her right shoulder pain.  Patient does have significant cervical spine spondylosis on MRI scan which is revealed today.  She has severe bilateral foraminal stenosis at C6-7 and at C5-6.  I think this is what is  causing her right arm symptoms.  Referral to Dr. Ernestina Patches for cervical spine ESI is indicated in this regard.  Follow-Up Instructions: No follow-ups on file.   Orders:  Orders Placed This Encounter  Procedures  . XR Ankle Complete Left  . Ambulatory referral to Physical Medicine Rehab   No orders of the defined types were placed in this encounter.     Procedures: No procedures performed   Clinical Data: No additional findings.  Objective: Vital Signs: Wt 203 lb (92.1 kg)   BMI 37.74 kg/m   Physical Exam:   Constitutional: Patient appears well-developed HEENT:  Head: Normocephalic Eyes:EOM are normal Neck: Normal range of motion Cardiovascular: Normal rate Pulmonary/chest: Effort normal Neurologic: Patient is alert Skin: Skin is warm Psychiatric: Patient has normal mood and affect    Ortho Exam: Ortho exam demonstrates pretty reasonable cervical spine range of motion albeit somewhat limited by about 30% of normal.  Patient has a little external rotation weakness in forward flexion weakness on the right compared to the left but no paresthesias C5-T1.  Grip strength symmetric and intact with palpable radial pulses bilaterally.  Left tibia demonstrates a 2 x 2 centimeter area which has slight drainage but what appears to be acute granulation tissue.  No  surrounding erythema or fluctuance.  Not much in the way of pitting edema in that left lower extremity.  Pedal pulses are palpable.  Specialty Comments:  No specialty comments available.  Imaging: Xr Ankle Complete Left  Result Date: 09/13/2019 AP lateral mortise left ankle reviewed.  No acute fracture or dislocation is present.  No evidence of osteomyelitis of the distal tibia.  Normal left ankle    PMFS History: Patient Active Problem List   Diagnosis Date Noted  . Spondylosis without myelopathy or radiculopathy, lumbar region 01/05/2019  . Foraminal stenosis of lumbar region 01/05/2019  . Arthritis 06/08/2018  .  GERD (gastroesophageal reflux disease) 06/08/2018  . Macular degeneration 06/08/2018  . Sleep apnea 06/08/2018  . Trouble swallowing 06/08/2018   History reviewed. No pertinent past medical history.  History reviewed. No pertinent family history.  History reviewed. No pertinent surgical history. Social History   Occupational History  . Not on file  Tobacco Use  . Smoking status: Current Every Day Smoker  . Smokeless tobacco: Never Used  Substance and Sexual Activity  . Alcohol use: Not on file  . Drug use: Not on file  . Sexual activity: Not on file

## 2019-09-25 ENCOUNTER — Encounter: Payer: Self-pay | Admitting: Orthopedic Surgery

## 2019-09-25 ENCOUNTER — Ambulatory Visit (INDEPENDENT_AMBULATORY_CARE_PROVIDER_SITE_OTHER): Payer: Medicare Other | Admitting: Orthopedic Surgery

## 2019-09-25 VITALS — Ht 61.5 in | Wt 203.0 lb

## 2019-09-25 DIAGNOSIS — L97921 Non-pressure chronic ulcer of unspecified part of left lower leg limited to breakdown of skin: Secondary | ICD-10-CM

## 2019-09-25 DIAGNOSIS — I87332 Chronic venous hypertension (idiopathic) with ulcer and inflammation of left lower extremity: Secondary | ICD-10-CM | POA: Diagnosis not present

## 2019-09-25 DIAGNOSIS — L97929 Non-pressure chronic ulcer of unspecified part of left lower leg with unspecified severity: Secondary | ICD-10-CM

## 2019-09-25 DIAGNOSIS — L03116 Cellulitis of left lower limb: Secondary | ICD-10-CM | POA: Diagnosis not present

## 2019-09-25 MED ORDER — PENTOXIFYLLINE ER 400 MG PO TBCR: 400.0000 mg | EXTENDED_RELEASE_TABLET | Freq: Three times a day (TID) | ORAL | 3 refills | Status: DC

## 2019-09-25 MED ORDER — DOXYCYCLINE HYCLATE 100 MG PO TABS: 100.0000 mg | ORAL_TABLET | Freq: Two times a day (BID) | ORAL | 0 refills | Status: DC

## 2019-09-25 NOTE — Progress Notes (Signed)
Office Visit Note   Patient: Alicia [name]           Date of Birth: December 21, 1945           MRN: [id number] Visit Date: 09/25/2019              Requested by: Doretha Sou, [place],  [place] [address] PCP: Doretha Sou, MD  Chief Complaint  Patient presents with  . Left Ankle - Wound Check      HPI: Patient is a 73 year old woman who was seen for initial evaluation for ulceration left leg.  Patient has been treated by Dr. Marlou Sa for a fracture secondary to a fall where patient had a large traumatic wound.  She states that most of the wound has healed however she still has persistent ulcer with cellulitis and pain.  Patient is seen in referral from Dr. Marlou Sa.  Assessment & Plan: Visit Diagnoses:  1. Cellulitis of left lower leg   2. Ulcer of left lower extremity, limited to breakdown of skin ([place])   3. Chronic venous hypertension (idiopathic) with ulcer and inflammation of left lower extremity ([facility])     Plan: Will start patient on [place] to help with the circulation as well as the venous insufficiency prescription for doxycycline as well as a prescription for a size medium medical compression stocking.  She will wear the stocking around-the-clock changing it for bathing.  Follow-Up Instructions: Return in about 2 weeks (around 10/09/2019).   Ortho Exam  Patient is alert, oriented, no adenopathy, well-dressed, normal affect, normal respiratory effort. Examination patient has venous insufficiency with varicose veins left lower extremity she has redness and tenderness to palpation around the ulcer which is 1 cm in diameter.  Previous radiographs have not shown to the bony involvement.  Patient does smoke daily and she states that she will not stop.  She has a palpable dorsalis pedis and posterior tibial pulse her toes are cool to the touch the calf measures 37 cm in circumference.  Patient has brawny skin color changes with varicose veins.   Imaging: No results found.   Labs: No results found for: HGBA1C, ESRSEDRATE, CRP, LABURIC, REPTSTATUS, GRAMSTAIN, CULT, LABORGA   No results found for: ALBUMIN, PREALBUMIN, LABURIC  No results found for: MG No results found for: VD25OH  No results found for: PREALBUMIN No flowsheet data found.   Body mass index is 37.74 kg/m.  Orders:  No orders of the defined types were placed in this encounter.  Meds ordered this encounter  Medications  . doxycycline (VIBRA-TABS) 100 MG tablet    Sig: Take 1 tablet (100 mg total) by mouth 2 (two) times daily.    Dispense:  30 tablet    Refill:  0  . pentoxifylline (TRENTAL) 400 MG CR tablet    Sig: Take 1 tablet (400 mg total) by mouth 3 (three) times daily with meals.    Dispense:  90 tablet    Refill:  3     Procedures: No procedures performed  Clinical Data: No additional findings.  ROS:  All other systems negative, except as noted in the HPI. Review of Systems  Objective: Vital Signs: Ht 5' 1.5" (1.562 m)   Wt 203 lb (92.1 kg)   BMI 37.74 kg/m   Specialty Comments:  No specialty comments available.  PMFS History: Patient Active Problem List   Diagnosis Date Noted  . Spondylosis without myelopathy or radiculopathy, lumbar region 01/05/2019  . Foraminal stenosis of  lumbar region 01/05/2019  . Arthritis 06/08/2018  . GERD (gastroesophageal reflux disease) 06/08/2018  . Macular degeneration 06/08/2018  . Sleep apnea 06/08/2018  . Trouble swallowing 06/08/2018   History reviewed. No pertinent past medical history.  History reviewed. No pertinent family history.  History reviewed. No pertinent surgical history. Social History   Occupational History  . Not on file  Tobacco Use  . Smoking status: Current Every Day Smoker  . Smokeless tobacco: Never Used  Substance and Sexual Activity  . Alcohol use: Not on file  . Drug use: Not on file  . Sexual activity: Not on file

## 2019-10-02 ENCOUNTER — Other Ambulatory Visit: Payer: Self-pay

## 2019-10-02 ENCOUNTER — Ambulatory Visit (INDEPENDENT_AMBULATORY_CARE_PROVIDER_SITE_OTHER): Payer: Medicare Other | Admitting: Physical Medicine and Rehabilitation

## 2019-10-02 ENCOUNTER — Encounter

## 2019-10-02 ENCOUNTER — Ambulatory Visit: Payer: Self-pay

## 2019-10-02 ENCOUNTER — Encounter: Payer: Self-pay | Admitting: Physical Medicine and Rehabilitation

## 2019-10-02 VITALS — BP 148/76 | HR 88

## 2019-10-02 DIAGNOSIS — M4802 Spinal stenosis, cervical region: Secondary | ICD-10-CM

## 2019-10-02 MED ORDER — METHYLPREDNISOLONE ACETATE 80 MG/ML IJ SUSP
80.0000 mg | Freq: Once | INTRAMUSCULAR | Status: AC
  Administered 2019-10-02: 11:00:00 80 mg

## 2019-10-02 NOTE — Progress Notes (Signed)
Pt states pain in right arm and right wrist. Pt states pain started after a fall May 2020. Pt states yard work makes pain worse. Pt states laying on extra pillows makes pain better.   .Numeric Pain Rating Scale and Functional Assessment Average Pain 8   In the last MONTH (on 0-10 scale) has pain interfered with the following?  1. General activity like being  able to carry out your everyday physical activities such as walking, climbing stairs, carrying groceries, or moving a chair?  Rating(8)   +Driver, -BT, -Dye Allergies.

## 2019-10-02 NOTE — Progress Notes (Signed)
Alicia [name] - 73 y.o. female MRN [id number]  Date of birth: Sep 10, 1946  Office Visit Note: Visit Date: 10/02/2019 PCP: Doretha Sou, MD Referred by: Doretha Sou*  Subjective: Chief Complaint  Patient presents with  . Right Arm - Pain  . Right Wrist - Pain  . Spine - Pain   HPI:  Alicia [name] is a 73 y.o. female who comes in today For planned right C7-T1 interlaminar epidural steroid injection.  Patient has been followed by Dr. Anderson Malta from an orthopedic standpoint for quite some time.  Have seen her for her low back on numerous occasion which she does have stenosis of the lower spine.  She had a pretty significant fall several months ago ended up with sacral fractures as well as healing her somewhat nonhealing ulcer on her leg which more recently she saw Dr. Sharol Given for this.  She initially felt a pop in her shoulder she now has pain radiating from the shoulder down into the midportion of the humerus and down of the forearm.  Occasionally into the hand no real numbness or tingling.  Cervical spine MRI was completed by Dr. Marlou Sa this is reviewed.  At see 4-5 and 5-6 she does have disc osteophyte causing some narrowing of the canal.  C4-5 is left paracentral C5-6 is bilateral somewhat more right paracentral.  No high-grade stenosis.  We will go ahead and complete injection today diagnostically.  Prior subacromial injection by Dr. Marlou Sa was ineffectual.  Could consider intra-articular injection with fluoroscopy of the shoulder.  She does have some pain on movement today.  She still tries to remain active doing yard work around her house.  ROS Otherwise per HPI.  Assessment & Plan: Visit Diagnoses:  1. Spinal stenosis of cervical region     Plan: No additional findings.   Meds & Orders:  Meds ordered this encounter  Medications  . methylPREDNISolone acetate (DEPO-MEDROL) injection 80 mg    Orders Placed This Encounter  Procedures  . XR C-ARM NO  REPORT  . Epidural Steroid injection    Follow-up: Return if symptoms worsen or fail to improve, for Anderson Malta, MD for wrist pain.   Procedures: No procedures performed  Cervical Epidural Steroid Injection - Interlaminar Approach with Fluoroscopic Guidance  Patient: Alicia [name]      Date of Birth: 05-31-1946 MRN: [id number] PCP: Doretha Sou, MD      Visit Date: 10/02/2019   Universal Protocol:    Date/Time: 10/01/2010:33 AM  Consent Given By: the patient  Position: PRONE  Additional Comments: Vital signs were monitored before and after the procedure. Patient was prepped and draped in the usual sterile fashion. The correct patient, procedure, and site was verified.   Injection Procedure Details:  Procedure Site One Meds Administered:  Meds ordered this encounter  Medications  . methylPREDNISolone acetate (DEPO-MEDROL) injection 80 mg     Laterality: Right  Location/Site: C7-T1  Needle size: 20 G  Needle type: Touhy  Needle Placement: Paramedian epidural space  Findings:  -Comments: Excellent flow of contrast into the epidural space.  Procedure Details: Using a paramedian approach from the side mentioned above, the region overlying the inferior lamina was localized under fluoroscopic visualization and the soft tissues overlying this structure were infiltrated with 4 ml. of 1% Lidocaine without Epinephrine. A # 20 gauge, Tuohy needle was inserted into the epidural space using a paramedian approach.  The epidural space was localized using loss of resistance along with lateral and  contralateral oblique bi-planar fluoroscopic views.  After negative aspirate for air, blood, and CSF, a 2 ml. volume of Isovue-250 was injected into the epidural space and the flow of contrast was observed. Radiographs were obtained for documentation purposes.   The injectate was administered into the level noted above.  Additional Comments:  The patient tolerated  the procedure well Dressing: 2 x 2 sterile gauze and Band-Aid    Post-procedure details: Patient was observed during the procedure. Post-procedure instructions were reviewed.  Patient left the clinic in stable condition.    Clinical History: MRI CERVICAL SPINE WITHOUT CONTRAST  TECHNIQUE: Multiplanar, multisequence MR imaging of the cervical spine was performed. No intravenous contrast was administered.  COMPARISON:  None.  FINDINGS: Alignment: 2 mm anterolisthesis of C3 on C4. 2 mm anterolisthesis of C7 on T1.  Vertebrae: No fracture, evidence of discitis, or bone lesion.  Cord: Normal signal and morphology.  Posterior Fossa, vertebral arteries, paraspinal tissues: Posterior fossa demonstrates no focal abnormality. Vertebral artery flow voids are maintained. Paraspinal soft tissues are unremarkable.  Disc levels:  Discs: Degenerative disease with disc height loss at C4-5, C5-6 and C6-7.  C2-3: No significant disc bulge. Moderate right and mild left facet arthropathy. Mild right foraminal stenosis. No left foraminal stenosis. No central canal stenosis.  C3-4: No significant disc bulge. Severe bilateral facet arthropathy. Severe bilateral foraminal stenosis. No central canal stenosis.  C4-5: Broad-based disc bulge with a small left paracentral disc protrusion. Left uncovertebral degenerative changes. Severe left foraminal stenosis. Mild right foraminal stenosis. No central canal stenosis.  C5-6: Broad-based disc osteophyte complex with a more focal right paracentral component contacting the ventral cervical spinal cord with mild flattening. Bilateral uncovertebral degenerative changes. Severe bilateral foraminal stenosis. Mild spinal stenosis.  C6-7: Mild broad-based disc bulge with a small left paracentral disc protrusion. Bilateral uncovertebral degenerative changes. Severe bilateral foraminal stenosis. No central canal stenosis.  C7-T1:  Broad-based disc bulge . Moderate right and mild left facet arthropathy. No neural foraminal stenosis. No central canal stenosis.  IMPRESSION: 1. Cervical spine spondylosis as described above. 2.  No acute osseous injury of the cervical spine.   Electronically Signed   By: Kathreen Devoid   On: 08/23/2019 09:31     Objective:  VS:  HT:    WT:   BMI:     BP:(!) 148/76  HR:88bpm  TEMP: ( )  RESP:  Physical Exam  Ortho Exam Imaging: Xr C-arm No Report  Result Date: 10/02/2019 Please see Notes tab for imaging impression.

## 2019-10-02 NOTE — Procedures (Signed)
Cervical Epidural Steroid Injection - Interlaminar Approach with Fluoroscopic Guidance  Patient: Alicia [name]      Date of Birth: 25-Dec-1945 MRN: [id number] PCP: Doretha Sou, MD      Visit Date: 10/02/2019   Universal Protocol:    Date/Time: 10/01/2010:33 AM  Consent Given By: the patient  Position: PRONE  Additional Comments: Vital signs were monitored before and after the procedure. Patient was prepped and draped in the usual sterile fashion. The correct patient, procedure, and site was verified.   Injection Procedure Details:  Procedure Site One Meds Administered:  Meds ordered this encounter  Medications  . methylPREDNISolone acetate (DEPO-MEDROL) injection 80 mg     Laterality: Right  Location/Site: C7-T1  Needle size: 20 G  Needle type: Touhy  Needle Placement: Paramedian epidural space  Findings:  -Comments: Excellent flow of contrast into the epidural space.  Procedure Details: Using a paramedian approach from the side mentioned above, the region overlying the inferior lamina was localized under fluoroscopic visualization and the soft tissues overlying this structure were infiltrated with 4 ml. of 1% Lidocaine without Epinephrine. A # 20 gauge, Tuohy needle was inserted into the epidural space using a paramedian approach.  The epidural space was localized using loss of resistance along with lateral and contralateral oblique bi-planar fluoroscopic views.  After negative aspirate for air, blood, and CSF, a 2 ml. volume of Isovue-250 was injected into the epidural space and the flow of contrast was observed. Radiographs were obtained for documentation purposes.   The injectate was administered into the level noted above.  Additional Comments:  The patient tolerated the procedure well Dressing: 2 x 2 sterile gauze and Band-Aid    Post-procedure details: Patient was observed during the procedure. Post-procedure instructions were  reviewed.  Patient left the clinic in stable condition.

## 2019-10-16 ENCOUNTER — Ambulatory Visit (INDEPENDENT_AMBULATORY_CARE_PROVIDER_SITE_OTHER): Payer: Medicare Other | Admitting: Orthopedic Surgery

## 2019-10-16 ENCOUNTER — Other Ambulatory Visit: Payer: Self-pay

## 2019-10-16 DIAGNOSIS — M7541 Impingement syndrome of right shoulder: Secondary | ICD-10-CM | POA: Diagnosis not present

## 2019-10-16 DIAGNOSIS — M542 Cervicalgia: Secondary | ICD-10-CM | POA: Diagnosis not present

## 2019-10-18 ENCOUNTER — Telehealth: Payer: Self-pay | Admitting: *Deleted

## 2019-10-18 NOTE — Telephone Encounter (Signed)
Never has to take anything she does not want to take, if she is taking TID then go to BID for 1 week then qhs for 1 week then stop. If only taking one time per day can just stop.

## 2019-10-18 NOTE — Telephone Encounter (Signed)
Called pt and advised.  

## 2019-10-19 ENCOUNTER — Ambulatory Visit: Payer: Medicare Other | Admitting: Orthopedic Surgery

## 2019-10-21 ENCOUNTER — Encounter: Payer: Self-pay | Admitting: Orthopedic Surgery

## 2019-10-21 DIAGNOSIS — M7541 Impingement syndrome of right shoulder: Secondary | ICD-10-CM

## 2019-10-21 MED ORDER — LIDOCAINE HCL 1 % IJ SOLN
5.0000 mL | INTRAMUSCULAR | Status: AC | PRN
  Administered 2019-10-21: 5 mL

## 2019-10-21 MED ORDER — BUPIVACAINE HCL 0.5 % IJ SOLN
9.0000 mL | INTRAMUSCULAR | Status: AC | PRN
  Administered 2019-10-21: 9 mL via INTRA_ARTICULAR

## 2019-10-21 MED ORDER — METHYLPREDNISOLONE ACETATE 40 MG/ML IJ SUSP
40.0000 mg | INTRAMUSCULAR | Status: AC | PRN
  Administered 2019-10-21: 40 mg via INTRA_ARTICULAR

## 2019-10-21 NOTE — Progress Notes (Signed)
Office Visit Note   Patient: Alicia [name]           Date of Birth: 1946/08/26           MRN: [id number] Visit Date: 10/16/2019 Requested by: Doretha Sou, [address],  [place] [address] PCP: Doretha Sou, MD  Subjective: Chief Complaint  Patient presents with  . Neck - Follow-up    HPI: [name] is a patient who reports whole right arm symptoms as well as shoulder pain and decreased right arm strength.  She did have a neck injection with Dr. Ernestina Patches in the past which helped.  She is outside usually 3 to 5 hours a day doing work.  At times she cannot get comfortable depending on activity level.  She worked as a Theme park manager for 46 years.  She states that her whole arm hurts at times.  She reports a lot of grinding in the shoulder at times.  Reports weakness as well.  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              ROS: All systems reviewed are negative as they relate to the chief complaint within the history of present illness.  Patient denies  fevers or chills.   Assessment & Plan: Visit Diagnoses:  1. Neck pain   2. Impingement syndrome of right shoulder     Plan: Impression is right shoulder grinding and coarseness consistent with impingement and/or rotator cuff problems as well as right arm radiculopathy.  We can inject the shoulder today in the subacromial space for diagnostic and therapeutic purposes and also have her see Dr. Ernestina Patches next week for cervical spine ESI.  Would like to try to iron out what percentage of her pain is coming from her neck pathology and what percentage could be coming from shoulder pathology.  I do not think she is in the market for surgery for either but I think it would be helpful just for pain relief to try some of these diagnostic and therapeutic injections.  Follow-Up Instructions: No follow-ups on file.   Orders:  Orders Placed This Encounter    Procedures  . Ambulatory referral to Physical Medicine Rehab   No orders of the defined types were placed in this encounter.     Procedures: Large Joint Inj on 10/21/2019 12:24 AM Indications: diagnostic evaluation and pain Details: 18 G 1.5 in needle, posterior approach  Arthrogram: No  Medications: 9 mL bupivacaine 0.5 %; 40 mg methylPREDNISolone acetate 40 MG/ML; 5 mL lidocaine 1 % Outcome: tolerated well, no immediate complications Procedure, treatment alternatives, risks and benefits explained, specific risks discussed. Consent was given by the patient. Immediately prior to procedure a time out was called to verify the correct patient, procedure, equipment, support staff and site/side marked as required. Patient was prepped and draped in the usual sterile fashion.       Clinical Data: No additional findings.  Objective: Vital Signs: There were no vitals taken for this visit.  Physical Exam:   Constitutional: Patient appears well-developed HEENT:  Head: Normocephalic Eyes:EOM are normal Neck: Normal range of motion Cardiovascular: Normal rate Pulmonary/chest: Effort normal Neurologic: Patient is alert Skin: Skin is warm Psychiatric: Patient has normal mood and affect    Ortho Exam: Ortho exam demonstrates full active and passive range of motion of the left arm.  On the right she has good grip EPL FPL interosseous wrist flexion extension bicep triceps  and deltoid strength palpable radial pulses.  Negative Tinel's cubital tunnel in the elbow.  Neck range of motion mildly limited.  No definite paresthesias C5-T1.  Reflexes symmetric.  Negative carpal tunnel compression testing.  Does have good rotator cuff strength infraspinatus supraspinatus and subscap muscle testing but she has asymmetric coarse grinding and crepitus with internal X rotation at 90 degrees abduction on the right shoulder compared to the left.  Specialty Comments:  No specialty comments  available.  Imaging: No results found.   PMFS History: Patient Active Problem List   Diagnosis Date Noted  . Spondylosis without myelopathy or radiculopathy, lumbar region 01/05/2019  . Foraminal stenosis of lumbar region 01/05/2019  . Arthritis 06/08/2018  . GERD (gastroesophageal reflux disease) 06/08/2018  . Macular degeneration 06/08/2018  . Sleep apnea 06/08/2018  . Trouble swallowing 06/08/2018   History reviewed. No pertinent past medical history.  History reviewed. No pertinent family history.  History reviewed. No pertinent surgical history. Social History   Occupational History  . Not on file  Tobacco Use  . Smoking status: Current Every Day Smoker  . Smokeless tobacco: Never Used  Substance and Sexual Activity  . Alcohol use: Not on file  . Drug use: Not on file  . Sexual activity: Not on file

## 2019-10-28 ENCOUNTER — Other Ambulatory Visit: Payer: Self-pay | Admitting: Physical Medicine and Rehabilitation

## 2019-10-30 NOTE — Telephone Encounter (Signed)
Please Advise Rx 

## 2019-12-18 ENCOUNTER — Other Ambulatory Visit: Payer: Self-pay | Admitting: Orthopedic Surgery

## 2019-12-18 NOTE — Telephone Encounter (Signed)
MD.

## 2019-12-18 NOTE — Telephone Encounter (Signed)
This is a GD pt 

## 2019-12-22 ENCOUNTER — Ambulatory Visit: Payer: Medicare Other | Admitting: Orthopedic Surgery

## 2019-12-27 ENCOUNTER — Other Ambulatory Visit: Payer: Self-pay | Admitting: Physical Medicine and Rehabilitation

## 2019-12-27 NOTE — Telephone Encounter (Signed)
Please advise 

## 2019-12-29 ENCOUNTER — Ambulatory Visit: Payer: Medicare Other | Admitting: Orthopedic Surgery

## 2020-01-19 ENCOUNTER — Ambulatory Visit: Payer: Medicare Other | Attending: Internal Medicine

## 2020-01-19 DIAGNOSIS — Z23 Encounter for immunization: Secondary | ICD-10-CM

## 2020-01-19 NOTE — Progress Notes (Signed)
   Covid-19 Vaccination Clinic  Name:  Alicia [name]    MRN: [id number] DOB: 1946-05-22  01/19/2020  [name] was observed post Covid-19 immunization for 15 minutes without incident. She was provided with Vaccine Information Sheet and instruction to access the V-Safe system.   [name] was instructed to call 911 with any severe reactions post vaccine: Marland Kitchen Difficulty breathing  . Swelling of face and throat  . A fast heartbeat  . A bad rash all over body  . Dizziness and weakness   Immunizations Administered    Name Date Dose VIS Date Route   Pfizer COVID-19 Vaccine 01/19/2020 10:48 AM 0.3 mL 10/20/2019 Intramuscular   Manufacturer: [place]   Lot: [id number]   [place]: [id number]

## 2020-02-02 ENCOUNTER — Ambulatory Visit (INDEPENDENT_AMBULATORY_CARE_PROVIDER_SITE_OTHER): Payer: Medicare Other | Admitting: Vascular Surgery

## 2020-02-02 ENCOUNTER — Encounter: Payer: Self-pay | Admitting: Vascular Surgery

## 2020-02-02 ENCOUNTER — Other Ambulatory Visit: Payer: Self-pay

## 2020-02-02 VITALS — BP 110/70 | HR 83 | Temp 98.2°F | Resp 16 | Ht 61.0 in | Wt 209.0 lb

## 2020-02-02 DIAGNOSIS — I781 Nevus, non-neoplastic: Secondary | ICD-10-CM | POA: Diagnosis not present

## 2020-02-02 NOTE — Progress Notes (Signed)
Patient ID: Alicia [name], female   DOB: 05-Nov-1946, 74 y.o.   MRN: [id number]  Reason for Consult: New Patient (Initial Visit) (lower left leg pain)   Referred by Shella Spearing, PA-C  Subjective:     HPI:  Alicia [name] is a 74 y.o. female previous tarsal bone injury on the left.  She did have a wound there that healed well under the care of Dr. Sharol Given.  She is a smoker currently.  She does not have any other tissue loss or ulceration.  She does not have any frank claudication.  States that she has cramping in the left foot and in the ankle.  This is not really with activity.  She does have spider veins in the left foot which came up after her injury and wearing a boot.  She has never had her lower extremity reflex studies tested but did have a duplex which demonstrated no DVT and she does not know of any history of DVT.  Does not have any frank history of peripheral arterial disease.  She does not take blood thinners.  She does take aspirin and statin.  Past Medical History:  Diagnosis Date  . Macular degeneration   . Sleep apnea    Family History  Problem Relation Age of Onset  . Heart disease Mother   . Cerebral aneurysm Father    History reviewed. No pertinent surgical history.  Short Social History:  Social History   Tobacco Use  . Smoking status: Current Every Day Smoker  . Smokeless tobacco: Never Used  Substance Use Topics  . Alcohol use: Not on file    Allergies  Allergen Reactions  . Penicillins Other (See Comments)    Yeast infection     Current Outpatient Medications  Medication Sig Dispense Refill  . aspirin EC 81 MG tablet Take 81 mg by mouth.    Marland Kitchen atorvastatin (LIPITOR) 40 MG tablet Take 40 mg by mouth at bedtime.    . bacitracin ophthalmic ointment Administer to both eyes daily at 0600.    Marland Kitchen Cholecalciferol (VITAMIN D) 2000 units tablet Take by mouth.    . Clobetasol Propionate 0.05 % shampoo Apply topically.    Marland Kitchen desonide (DESOWEN)  0.05 % lotion Apply topically.    . diazepam (VALIUM) 5 MG tablet Take one by mouth 1 hour prior to procedure, repeat as needed 2 tablet 0  . diazepam (VALIUM) 5 MG tablet Take 10mg  (2 tabs) 20 minutes prior to procedure. 2 tablet 0  . doxycycline (VIBRA-TABS) 100 MG tablet Take 1 tablet (100 mg total) by mouth 2 (two) times daily. 30 tablet 0  . DULoxetine (CYMBALTA) 60 MG capsule TAKE 1 CAPSULE BY MOUTH EVERY DAY 90 capsule 7  . Fluocinolone Acetonide 0.01 % OIL Place 0.01 % in ear(s).    . gabapentin (NEURONTIN) 100 MG capsule Take 1 capsule (100 mg total) by mouth 3 (three) times daily. 270 capsule 3  . Lutein 20 MG TABS Take 20 mg by mouth.    . magnesium oxide (MAG-OX) 400 MG tablet Take 400 mg by mouth.    . meloxicam (MOBIC) 7.5 MG tablet Take 7.5 mg 2 (two) times daily by mouth.  0  . omeprazole (PRILOSEC) 20 MG capsule TAKE 1 CAPSULE BY MOUTH EVERY DAY.NEED OFFICE VISIT FOR MORE REFILLS  1  . oxybutynin (DITROPAN-XL) 10 MG 24 hr tablet Take 10 mg daily by mouth.  1  . pentoxifylline (TRENTAL) 400 MG CR tablet TAKE 1 TABLET (  400 MG TOTAL) BY MOUTH 3 (THREE) TIMES DAILY WITH MEALS. 270 tablet 1  . pramipexole (MIRAPEX) 0.5 MG tablet Take 0.5 mg 2 (two) times daily by mouth.  1  . tobramycin (TOBREX) 0.3 % ophthalmic solution      Current Facility-Administered Medications  Medication Dose Route Frequency Provider Last Rate Last Admin  . methylPREDNISolone acetate (DEPO-MEDROL) injection 80 mg  80 mg Other Once Magnus Sinning, MD        Review of Systems  Constitutional:  Constitutional negative. HENT: HENT negative.  Eyes: Eyes negative.  Respiratory: Respiratory negative.  Cardiovascular: Cardiovascular negative.  GI: Gastrointestinal negative.  Musculoskeletal: Positive for leg pain and joint pain.  Skin: Skin negative.  Neurological: Neurological negative. Hematologic: Hematologic/lymphatic negative.  Psychiatric: Psychiatric negative.        Objective:  Objective    Vitals:   02/02/20 1019  Resp: 16  SpO2: 98%  Weight: 209 lb (94.8 kg)  Height: 5\' 1"  (1.549 m)   Body mass index is 39.49 kg/m.  Physical Exam HENT:     Head: Normocephalic.     Nose:     Comments: Mask in place Eyes:     Pupils: Pupils are equal, round, and reactive to light.  Neck:     Vascular: No carotid bruit.  Cardiovascular:     Pulses: Normal pulses.  Pulmonary:     Effort: Pulmonary effort is normal.     Breath sounds: Normal breath sounds.  Abdominal:     General: Abdomen is flat.     Palpations: Abdomen is soft.  Musculoskeletal:        General: Normal range of motion.     Left lower leg: Edema present.  Skin:    Capillary Refill: Capillary refill takes less than 2 seconds.     Comments: Spider telangiectasias left medial foot  Neurological:     General: No focal deficit present.     Mental Status: She is alert.  Psychiatric:        Mood and Affect: Mood normal.        Behavior: Behavior normal.        Thought Content: Thought content normal.        Judgment: Judgment normal.     Data: I reviewed the read of previous venous duplex which is negative for bilateral lower extremity DVT and also her ABIs which were 1.1 right and 1.03 left all performed at [facility].     Assessment/Plan:     74 year old female with left foot pain normal ABIs and negative DVT study.  She does have some swelling and spider telangiectasias.  I recommended reflux study.  Most of her pain likely is posttraumatic I discussed with her that intervention even if she has reflux may not be terribly helpful for her level of discomfort she demonstrates good understanding we will get her a visit soon with the reflux test.     Waynetta Sandy MD [facility]

## 2020-02-05 ENCOUNTER — Other Ambulatory Visit: Payer: Self-pay | Admitting: *Deleted

## 2020-02-05 DIAGNOSIS — I781 Nevus, non-neoplastic: Secondary | ICD-10-CM

## 2020-02-12 ENCOUNTER — Ambulatory Visit: Payer: Medicare Other | Attending: Internal Medicine

## 2020-02-12 DIAGNOSIS — Z23 Encounter for immunization: Secondary | ICD-10-CM

## 2020-02-12 NOTE — Progress Notes (Signed)
   Covid-19 Vaccination Clinic  Name:  Alicia [name]    MRN: [id number] DOB: 27-Feb-1946  02/12/2020  Alicia [name] was observed post Covid-19 immunization for 15 minutes without incident. She was provided with Vaccine Information Sheet and instruction to access the V-Safe system.   Alicia [name] was instructed to call 911 with any severe reactions post vaccine: Marland Kitchen Difficulty breathing  . Swelling of face and throat  . A fast heartbeat  . A bad rash all over body  . Dizziness and weakness   Immunizations Administered    Name Date Dose VIS Date Route   Pfizer COVID-19 Vaccine 02/12/2020  1:50 PM 0.3 mL 10/20/2019 Intramuscular   Manufacturer: [place]   Lot: [id number]   [place]: [id number]

## 2020-02-19 ENCOUNTER — Other Ambulatory Visit: Payer: Self-pay

## 2020-02-19 ENCOUNTER — Ambulatory Visit (INDEPENDENT_AMBULATORY_CARE_PROVIDER_SITE_OTHER): Payer: Medicare Other

## 2020-02-19 ENCOUNTER — Ambulatory Visit (INDEPENDENT_AMBULATORY_CARE_PROVIDER_SITE_OTHER): Payer: Medicare Other | Admitting: Orthopedic Surgery

## 2020-02-19 DIAGNOSIS — M79672 Pain in left foot: Secondary | ICD-10-CM

## 2020-02-20 ENCOUNTER — Encounter: Payer: Self-pay | Admitting: Orthopedic Surgery

## 2020-02-20 NOTE — Progress Notes (Signed)
Office Visit Note   Patient: Alicia [name]           Date of Birth: 1945-12-24           MRN: [id number] Visit Date: 02/19/2020 Requested by: Doretha Sou, [address] [place],  [place] [address] PCP: Doretha Sou, MD  Subjective: Chief Complaint  Patient presents with  . Left Foot - Pain    HPI: Alicia [name] is a patient with left foot and ankle pain.  Continues with pain in both feet with numbness and tingling.  Goes all the way up to her buttocks.  Complains of her toes feeling cold at times.  She did have a vascular study of the left leg.  She did heal up the left distal tibial wound but it did take many months.  Has a history of epidural steroid injections for her back.  She has some type of other diagnostic vascular study planned for Friday.  She is on Neurontin which helps her.  She takes 3 of those per day.              ROS: All systems reviewed are negative as they relate to the chief complaint within the history of present illness.  Patient denies  fevers or chills.   Assessment & Plan: Visit Diagnoses:  1. 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  No acute fracture.  I do not think that the talonavicular arthritis and subsequent diminished subtalar motion is accounting for all of her neuropathic and claudication symptoms in her legs.  If she wants to pursue some type of operative intervention then I think I would have her see Dr. Sharol [name] but for now she should complete her vascular work-up before considering any type of foot procedure.  Follow-up with me as needed.  Follow-Up Instructions: Return if symptoms worsen or fail to improve.   Orders:  Orders Placed This Encounter  Procedures  . XR Foot Complete Left   No orders of the defined types were placed in this encounter.     Procedures: No procedures performed   Clinical Data: No additional findings.   Objective: Vital Signs: There were no vitals taken for this visit.  Physical Exam:   Constitutional: Patient appears well-developed HEENT:  Head: Normocephalic Eyes:EOM are normal Neck: Normal range of motion Cardiovascular: Normal rate Pulmonary/chest: Effort normal Neurologic: Patient is alert Skin: Skin is warm Psychiatric: Patient has normal mood and affect    Ortho Exam: Ortho exam demonstrates pretty normal gait alignment.  She has good ankle dorsiflexion plantarflexion quad hamstring strength.  No definite nerve root tension signs today.  Pulses are diminished bilaterally but both feet are warm.  She does have diminished subtalar motion on the left compared to the right.  Tibiotalar motion is intact bilaterally.  Mild tenderness to palpation around the midfoot dorsally.  Specialty Comments:  No specialty comments available.  Imaging: No results found.   PMFS History: Patient Active Problem List   Diagnosis Date Noted  . Spondylosis without myelopathy or radiculopathy, lumbar region 01/05/2019  . Foraminal stenosis of lumbar region 01/05/2019  . Arthritis 06/08/2018  . GERD (gastroesophageal reflux disease) 06/08/2018  . Macular degeneration 06/08/2018  . Sleep apnea 06/08/2018  . Trouble swallowing 06/08/2018   Past Medical History:  Diagnosis Date  . Macular degeneration   . Sleep apnea     Family History  Problem Relation Age  of Onset  . Heart disease Mother   . Cerebral aneurysm Father     No past surgical history on file. Social History   Occupational History  . Not on file  Tobacco Use  . Smoking status: Current Every Day Smoker  . Smokeless tobacco: Never Used  Substance and Sexual Activity  . Alcohol use: Never  . Drug use: Never  . Sexual activity: Not on file

## 2020-02-28 ENCOUNTER — Telehealth (HOSPITAL_COMMUNITY): Payer: Self-pay

## 2020-02-28 NOTE — Telephone Encounter (Signed)

## 2020-02-29 ENCOUNTER — Other Ambulatory Visit: Payer: Self-pay

## 2020-02-29 ENCOUNTER — Ambulatory Visit (INDEPENDENT_AMBULATORY_CARE_PROVIDER_SITE_OTHER): Payer: Medicare Other | Admitting: Physician Assistant

## 2020-02-29 ENCOUNTER — Ambulatory Visit (HOSPITAL_COMMUNITY)
Admission: RE | Admit: 2020-02-29 | Discharge: 2020-02-29 | Disposition: A | Discharge status: Home / Self Care | Payer: Medicare Other | Source: Ambulatory Visit | Attending: Vascular Surgery | Admitting: Vascular Surgery

## 2020-02-29 DIAGNOSIS — I781 Nevus, non-neoplastic: Secondary | ICD-10-CM

## 2020-02-29 DIAGNOSIS — M25572 Pain in left ankle and joints of left foot: Secondary | ICD-10-CM

## 2020-02-29 DIAGNOSIS — I872 Venous insufficiency (chronic) (peripheral): Secondary | ICD-10-CM | POA: Diagnosis not present

## 2020-02-29 DIAGNOSIS — M25579 Pain in unspecified ankle and joints of unspecified foot: Secondary | ICD-10-CM | POA: Insufficient documentation

## 2020-02-29 NOTE — Progress Notes (Signed)
Office Note     CC:  follow up Requesting Provider:  Doretha Sou*  HPI: Alicia [name] is a 74 y.o. (August 07, 1946) female who presents to go over reflux study of left lower extremity.  Patient previously had a left tarsal bone injury with a wound that was cared for and is now well-healed by Dr. Sharol Given.  She complains of cramping and pain in her left foot and ankle in addition to numbness of the left foot.  She denies claudication.  She previously had a DVT study which was negative.  She does have spider veins in her left foot and ankle however these are not bothersome to her.  She denies any symptoms of edema, burning, itching, cracking skin, or history of venous ulcerations.  She is an everyday smoker.  She takes a daily aspirin and statin.   Past Medical History:  Diagnosis Date  . Macular degeneration   . Sleep apnea     No past surgical history on file.  Social History   Socioeconomic History  . Marital status: Widowed    Spouse name: Not on file  . Number of children: Not on file  . Years of education: Not on file  . Highest education level: Not on file  Occupational History  . Not on file  Tobacco Use  . Smoking status: Current Every Day Smoker  . Smokeless tobacco: Never Used  Substance and Sexual Activity  . Alcohol use: Never  . Drug use: Never  . Sexual activity: Not on file  Other Topics Concern  . Not on file  Social History Narrative  . Not on file   Social Determinants of Health   Financial Resource Strain:   . Difficulty of Paying Living Expenses:   Food Insecurity:   . Worried About Charity fundraiser in the Last Year:   . Arboriculturist in the Last Year:   Transportation Needs:   . Film/video editor (Medical):   Marland Kitchen Lack of Transportation (Non-Medical):   Physical Activity:   . Days of Exercise per Week:   . Minutes of Exercise per Session:   Stress:   . Feeling of Stress :   Social Connections:   . Frequency of  Communication with Friends and Family:   . Frequency of Social Gatherings with Friends and Family:   . Attends Religious Services:   . Active Member of Clubs or Organizations:   . Attends Archivist Meetings:   Marland Kitchen Marital Status:   Intimate Partner Violence:   . Fear of Current or Ex-Partner:   . Emotionally Abused:   Marland Kitchen Physically Abused:   . Sexually Abused:     Family History  Problem Relation Age of Onset  . Heart disease Mother   . Cerebral aneurysm Father     Current Outpatient Medications  Medication Sig Dispense Refill  . aspirin EC 81 MG tablet Take 81 mg by mouth.    Marland Kitchen atorvastatin (LIPITOR) 40 MG tablet Take 40 mg by mouth at bedtime.    . bacitracin ophthalmic ointment Administer to both eyes daily at 0600.    Marland Kitchen Cholecalciferol (VITAMIN D) 2000 units tablet Take by mouth.    . Clobetasol Propionate 0.05 % shampoo Apply topically.    Marland Kitchen desonide (DESOWEN) 0.05 % lotion Apply topically.    . diazepam (VALIUM) 5 MG tablet Take one by mouth 1 hour prior to procedure, repeat as needed 2 tablet 0  . diazepam (VALIUM) 5 MG tablet  Take 10mg  (2 tabs) 20 minutes prior to procedure. 2 tablet 0  . doxycycline (VIBRA-TABS) 100 MG tablet Take 1 tablet (100 mg total) by mouth 2 (two) times daily. 30 tablet 0  . DULoxetine (CYMBALTA) 60 MG capsule TAKE 1 CAPSULE BY MOUTH EVERY DAY 90 capsule 7  . Fluocinolone Acetonide 0.01 % OIL Place 0.01 % in ear(s).    . gabapentin (NEURONTIN) 100 MG capsule Take 1 capsule (100 mg total) by mouth 3 (three) times daily. 270 capsule 3  . Lutein 20 MG TABS Take 20 mg by mouth.    . magnesium oxide (MAG-OX) 400 MG tablet Take 400 mg by mouth.    . meloxicam (MOBIC) 7.5 MG tablet Take 7.5 mg 2 (two) times daily by mouth.  0  . omeprazole (PRILOSEC) 20 MG capsule TAKE 1 CAPSULE BY MOUTH EVERY DAY.NEED OFFICE VISIT FOR MORE REFILLS  1  . oxybutynin (DITROPAN-XL) 10 MG 24 hr tablet Take 10 mg daily by mouth.  1  . pentoxifylline (TRENTAL) 400 MG  CR tablet TAKE 1 TABLET (400 MG TOTAL) BY MOUTH 3 (THREE) TIMES DAILY WITH MEALS. 270 tablet 1  . pramipexole (MIRAPEX) 0.5 MG tablet Take 0.5 mg 2 (two) times daily by mouth.  1  . tobramycin (TOBREX) 0.3 % ophthalmic solution      Current Facility-Administered Medications  Medication Dose Route Frequency Provider Last Rate Last Admin  . methylPREDNISolone acetate (DEPO-MEDROL) injection 80 mg  80 mg Other Once Magnus Sinning, MD        Allergies  Allergen Reactions  . Penicillins Other (See Comments)    Yeast infection      REVIEW OF SYSTEMS:   [X]  denotes positive finding, [ ]  denotes negative finding Cardiac  Comments:  Chest pain or chest pressure:    Shortness of breath upon exertion:    Short of breath when lying flat:    Irregular heart rhythm:        Vascular    Pain in calf, thigh, or hip brought on by ambulation:    Pain in feet at night that wakes you up from your sleep:     Blood clot in your veins:    Leg swelling:         Pulmonary    Oxygen at home:    Productive cough:     Wheezing:         Neurologic    Sudden weakness in arms or legs:     Sudden numbness in arms or legs:     Sudden onset of difficulty speaking or slurred speech:    Temporary loss of vision in one eye:     Problems with dizziness:         Gastrointestinal    Blood in stool:     Vomited blood:         Genitourinary    Burning when urinating:     Blood in urine:        Psychiatric    Major depression:         Hematologic    Bleeding problems:    Problems with blood clotting too easily:        Skin    Rashes or ulcers:        Constitutional    Fever or chills:      PHYSICAL EXAMINATION:  Vitals:   02/29/20 1311  BP: (!) 150/85  Pulse: 85  Resp: 20  Temp: (!) 97.4 F (36.3 C)  SpO2: 96%  Weight: 213 lb 4.8 oz (96.8 kg)  Height: 5' 1.5" (1.562 m)    General:  WDWN in NAD; vital signs documented above Gait: Not observed HENT: WNL, normocephalic  Pulmonary: normal non-labored breathing , without Rales, rhonchi,  wheezing Cardiac: regular HR Abdomen: soft, NT, no masses Skin: without rashes Vascular Exam/Pulses:  Right Left  Radial 2+ (normal) 2+ (normal)  DP 1+ (weak)  2+ ATA  PT absent absent   Extremities: Well-healed medial ankle wound; prominent spider veins left foot and ankle no scabbing; no varicose veins; no edema left compared to right leg Musculoskeletal: no muscle wasting or atrophy  Neurologic: A&O X 3;  No focal weakness or paresthesias are detected Psychiatric:  The pt has Normal affect.   Non-Invasive Vascular Imaging:   Negative for DVT Negative for deep venous reflux Venous reflux noted in greater saphenous vein from saphenofemoral junction to proximal calf with diameter greater than 5 mm throughout    ASSESSMENT/PLAN:: 74 y.o. female presents to go over venous reflux study  -Venous reflux study demonstrates greater saphenous vein reflux from saphenofemoral junction to proximal calf -Patient complains of left foot pain and cramping related to prior tarsal bone injury -She denies any significant vein symptoms such as edema, tissue changes, aching, heavy feeling, or history of venous ulcerations -I do not feel she would benefit from ablation therapy; laser ablation would likely not address her current symptoms -If patient experiences venous symptoms in the future I recommended compression and elevation and to call/return the office if the symptoms are bothersome -For now she can follow-up on an as-needed basis  Dagoberto Ligas, PA-C [facility] [phone]  Clinic MD:   Oneida Alar

## 2020-06-04 ENCOUNTER — Other Ambulatory Visit: Payer: Self-pay | Admitting: Orthopedic Surgery

## 2020-07-31 ENCOUNTER — Ambulatory Visit: Payer: Self-pay

## 2020-07-31 ENCOUNTER — Encounter: Payer: Self-pay | Admitting: Orthopedic Surgery

## 2020-07-31 ENCOUNTER — Ambulatory Visit (INDEPENDENT_AMBULATORY_CARE_PROVIDER_SITE_OTHER): Payer: Medicare Other | Admitting: Orthopedic Surgery

## 2020-07-31 DIAGNOSIS — Z720 Tobacco use: Secondary | ICD-10-CM

## 2020-07-31 DIAGNOSIS — M7542 Impingement syndrome of left shoulder: Secondary | ICD-10-CM

## 2020-07-31 DIAGNOSIS — M25512 Pain in left shoulder: Secondary | ICD-10-CM

## 2020-07-31 DIAGNOSIS — M7541 Impingement syndrome of right shoulder: Secondary | ICD-10-CM | POA: Diagnosis not present

## 2020-07-31 DIAGNOSIS — M25511 Pain in right shoulder: Secondary | ICD-10-CM | POA: Diagnosis not present

## 2020-07-31 MED ORDER — LIDOCAINE HCL 1 % IJ SOLN
5.0000 mL | INTRAMUSCULAR | Status: AC | PRN
  Administered 2020-07-31: 5 mL

## 2020-07-31 MED ORDER — METHYLPREDNISOLONE ACETATE 40 MG/ML IJ SUSP
40.0000 mg | INTRAMUSCULAR | Status: AC | PRN
  Administered 2020-07-31: 40 mg via INTRA_ARTICULAR

## 2020-07-31 MED ORDER — BUPIVACAINE HCL 0.5 % IJ SOLN
9.0000 mL | INTRAMUSCULAR | Status: AC | PRN
  Administered 2020-07-31: 9 mL via INTRA_ARTICULAR

## 2020-07-31 NOTE — Progress Notes (Signed)
Office Visit Note   Patient: Alicia [name]           Date of Birth: 05/28/1946           MRN: [id number] Visit Date: 07/31/2020 Requested by: Doretha Sou, [place],  [place] [id number] PCP: Doretha Sou, MD  Subjective: Chief Complaint  Patient presents with  . Shoulder Pain    bilateral    HPI: Alicia [name] is a 74 year old patient with bilateral shoulder pain.  She is had an injection in the past which is helped her.  Denies any interval history of injury.  Denies any radicular symptoms.  Does have some sporadic neck pain.  Right shoulder hurts her more than the left.  Requesting bilateral shoulder injections today.  She is not diabetic.  Denies any radiation of symptoms below the elbow.              ROS: All systems reviewed are negative as they relate to the chief complaint within the history of present illness.  Patient denies  fevers or chills.   Assessment & Plan: Visit Diagnoses:  1. Bilateral shoulder pain, unspecified chronicity     Plan: Impression is bilateral shoulder impingement with possible rotator cuff pathology on the right-hand side based on exam.  Bilateral shoulder injections performed today.  She does not really want to proceed with any operative intervention work-up at this time which is understandable.  Injections have helped her in the past.  Avoid overhead lifting.  Follow-up with me as needed.  Follow-Up Instructions: Return if symptoms worsen or fail to improve.   Orders:  Orders Placed This Encounter  Procedures  . XR Shoulder Right  . XR Shoulder Left   No orders of the defined types were placed in this encounter.     Procedures: Large Joint Inj: bilateral subacromial bursa on 07/31/2020 8:59 PM Indications: diagnostic evaluation and pain Details: 18 G 1.5 in needle, posterior approach  Arthrogram: No  Medications (Right): 5 mL lidocaine 1 %; 9 mL bupivacaine 0.5 %; 40 mg methylPREDNISolone acetate  40 MG/ML Medications (Left): 5 mL lidocaine 1 %; 9 mL bupivacaine 0.5 %; 40 mg methylPREDNISolone acetate 40 MG/ML Outcome: tolerated well, no immediate complications Procedure, treatment alternatives, risks and benefits explained, specific risks discussed. Consent was given by the patient. Immediately prior to procedure a time out was called to verify the correct patient, procedure, equipment, support staff and site/side marked as required. Patient was prepped and draped in the usual sterile fashion.       Clinical Data: No additional findings.  Objective: Vital Signs: There were no vitals taken for this visit.  Physical Exam:   Constitutional: Patient appears well-developed HEENT:  Head: Normocephalic Eyes:EOM are normal Neck: Normal range of motion Cardiovascular: Normal rate Pulmonary/chest: Effort normal Neurologic: Patient is alert Skin: Skin is warm Psychiatric: Patient has normal mood and affect    Ortho Exam: Ortho exam demonstrates some crepitus with passive range of motion on the right but not on the left.  Rotator cuff strength however is pretty good symmetrically with infraspinatus supraspinatus and subscap muscle testing.  No masses lymphadenopathy or skin changes noted in the shoulder girdle region.  She has no restriction of external rotation of 15 degrees of abduction no AC joint tenderness to direct palpation.  Impingement signs equivocal bilaterally.  Specialty Comments:  No specialty comments available.  Imaging: XR Shoulder Left  Result Date: 07/31/2020 AP outlet and axillary left shoulder reviewed.  Shoulder is located.  No glenohumeral joint arthritis or AC joint arthritis is present.  Acromiohumeral distance maintained.  No acute fracture.  Visualized lung fields clear.  Normal left shoulder radiographs  XR Shoulder Right  Result Date: 07/31/2020 AP outlet axillary right shoulder reviewed.  Shoulder is located.  No glenohumeral or AC joint  degenerative changes.  Acromiohumeral distance maintained.  No acute fracture.  Normal right shoulder    PMFS History: Patient Active Problem List   Diagnosis Date Noted  . Chronic venous insufficiency 02/29/2020  . Pain in joint, ankle and foot 02/29/2020  . Spondylosis without myelopathy or radiculopathy, lumbar region 01/05/2019  . Foraminal stenosis of lumbar region 01/05/2019  . Arthritis 06/08/2018  . GERD (gastroesophageal reflux disease) 06/08/2018  . Macular degeneration 06/08/2018  . Sleep apnea 06/08/2018  . Trouble swallowing 06/08/2018   Past Medical History:  Diagnosis Date  . Macular degeneration   . Sleep apnea     Family History  Problem Relation Age of Onset  . Heart disease Mother   . Cerebral aneurysm Father     History reviewed. No pertinent surgical history. Social History   Occupational History  . Not on file  Tobacco Use  . Smoking status: Current Every Day Smoker  . Smokeless tobacco: Never Used  Substance and Sexual Activity  . Alcohol use: Never  . Drug use: Never  . Sexual activity: Not on file

## 2020-08-09 ENCOUNTER — Other Ambulatory Visit: Payer: Self-pay | Admitting: Physician Assistant

## 2020-08-09 DIAGNOSIS — Z1231 Encounter for screening mammogram for malignant neoplasm of breast: Secondary | ICD-10-CM

## 2020-08-12 ENCOUNTER — Other Ambulatory Visit: Payer: Self-pay | Admitting: Physical Medicine and Rehabilitation

## 2020-08-13 NOTE — Telephone Encounter (Signed)
Please advise 

## 2020-08-22 ENCOUNTER — Other Ambulatory Visit: Payer: Self-pay | Admitting: Physical Medicine and Rehabilitation

## 2020-08-22 ENCOUNTER — Other Ambulatory Visit: Payer: Self-pay | Admitting: Orthopedic Surgery

## 2020-08-22 ENCOUNTER — Telehealth: Payer: Self-pay | Admitting: Physical Medicine and Rehabilitation

## 2020-08-22 MED ORDER — GABAPENTIN 300 MG PO CAPS: ORAL_CAPSULE | ORAL | 3 refills | Status: DC

## 2020-08-22 NOTE — Telephone Encounter (Signed)
Pt called asking for a refill of her gabapentin be sent in to the [facility] pharmacy on [address] and a CB to let her know it's been called in

## 2020-08-22 NOTE — Telephone Encounter (Signed)
Please advise 

## 2020-08-22 NOTE — Telephone Encounter (Signed)
Called patient to advise  °

## 2020-08-22 NOTE — Telephone Encounter (Signed)
Done

## 2020-08-23 NOTE — Telephone Encounter (Signed)
What does she need abx for? Prob needs appointment if she thinks she has infection

## 2020-08-29 ENCOUNTER — Ambulatory Visit
Admission: RE | Admit: 2020-08-29 | Discharge: 2020-08-29 | Disposition: A | Discharge status: Home / Self Care | Payer: Medicare Other | Source: Ambulatory Visit | Attending: Physician Assistant | Admitting: Physician Assistant

## 2020-08-29 ENCOUNTER — Other Ambulatory Visit: Payer: Self-pay

## 2020-08-29 DIAGNOSIS — Z1231 Encounter for screening mammogram for malignant neoplasm of breast: Secondary | ICD-10-CM

## 2020-09-02 ENCOUNTER — Other Ambulatory Visit: Payer: Self-pay | Admitting: Physician Assistant

## 2020-09-02 DIAGNOSIS — R928 Other abnormal and inconclusive findings on diagnostic imaging of breast: Secondary | ICD-10-CM

## 2020-09-16 ENCOUNTER — Other Ambulatory Visit: Payer: Self-pay | Admitting: Physician Assistant

## 2020-09-16 ENCOUNTER — Other Ambulatory Visit: Payer: Self-pay

## 2020-09-16 ENCOUNTER — Ambulatory Visit
Admission: RE | Admit: 2020-09-16 | Discharge: 2020-09-16 | Disposition: A | Discharge status: Home / Self Care | Payer: Medicare Other | Source: Ambulatory Visit | Attending: Physician Assistant | Admitting: Physician Assistant

## 2020-09-16 DIAGNOSIS — R928 Other abnormal and inconclusive findings on diagnostic imaging of breast: Secondary | ICD-10-CM

## 2020-09-18 ENCOUNTER — Other Ambulatory Visit: Payer: Self-pay | Admitting: Physician Assistant

## 2020-09-18 ENCOUNTER — Ambulatory Visit (INDEPENDENT_AMBULATORY_CARE_PROVIDER_SITE_OTHER): Payer: Medicare Other | Admitting: Orthopedic Surgery

## 2020-09-18 ENCOUNTER — Other Ambulatory Visit: Payer: Self-pay

## 2020-09-18 ENCOUNTER — Ambulatory Visit (INDEPENDENT_AMBULATORY_CARE_PROVIDER_SITE_OTHER): Payer: Medicare Other

## 2020-09-18 DIAGNOSIS — M19011 Primary osteoarthritis, right shoulder: Secondary | ICD-10-CM

## 2020-09-18 DIAGNOSIS — M25552 Pain in left hip: Secondary | ICD-10-CM | POA: Diagnosis not present

## 2020-09-18 DIAGNOSIS — M25551 Pain in right hip: Secondary | ICD-10-CM

## 2020-09-21 ENCOUNTER — Encounter: Payer: Self-pay | Admitting: Orthopedic Surgery

## 2020-09-21 DIAGNOSIS — M19011 Primary osteoarthritis, right shoulder: Secondary | ICD-10-CM

## 2020-09-21 MED ORDER — BUPIVACAINE HCL 0.5 % IJ SOLN
9.0000 mL | INTRAMUSCULAR | Status: AC | PRN
  Administered 2020-09-21: 9 mL via INTRA_ARTICULAR

## 2020-09-21 MED ORDER — LIDOCAINE HCL 1 % IJ SOLN
5.0000 mL | INTRAMUSCULAR | Status: AC | PRN
  Administered 2020-09-21: 5 mL

## 2020-09-21 MED ORDER — METHYLPREDNISOLONE ACETATE 40 MG/ML IJ SUSP
40.0000 mg | INTRAMUSCULAR | Status: AC | PRN
  Administered 2020-09-21: 40 mg via INTRA_ARTICULAR

## 2020-09-21 NOTE — Progress Notes (Signed)
Office Visit Note   Patient: Alicia [name]           Date of Birth: Jul 25, 1946           MRN: [id number] Visit Date: 09/18/2020 Requested by: Doretha Sou, [address] [place],  [place] [address] PCP: Doretha Sou, MD  Subjective: Chief Complaint  Patient presents with  . Right Shoulder - Pain  . Left Hip - Pain  . Right Hip - Pain    HPI: Alicia [name] is a 74 year old patient with right shoulder pain.  Requesting injection today.  Has a known history of shoulder arthritis and bursitis.  She also reports bilateral hip pain left worse than right.  She fell in the hip last Friday and a low-energy fall.  She has a history of a fall on the right-hand side.  Reports nerve type pain radiating down the left leg and radicular fashion.  She does have a history of ablation treatment for facet arthritis in 2013.  Scheduled for breast biopsy possibly for cancer on Monday.  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              ROS: All systems reviewed are negative as they relate to the chief complaint within the history of present illness.  Patient denies  fevers or chills.   Assessment & Plan: Visit Diagnoses:  1. Bilateral hip pain     Plan: Impression is bilateral hip pain likely coming from stenosis and possible aggravation of healed insufficiency fractures.  Like to send her to Dr. Ernestina Patches for L spine ESI.  Radiographs today are unrevealing other than showing significant arthritis.  No groin pain today.  Regarding the shoulder we will inject the glenohumeral joint with cortisone today.  Follow-up as needed.  Follow-Up Instructions: No follow-ups on file.   Orders:  Orders Placed This Encounter  Procedures  . XR HIPS BILAT W OR W/O PELVIS 3-4 VIEWS  . Ambulatory referral to Physical Medicine Rehab   No orders of the defined types were placed in this encounter.      Procedures: Large Joint Inj: R glenohumeral on 09/21/2020 10:59 PM Indications: diagnostic evaluation and pain Details: 18 G 1.5 in needle, posterior approach  Arthrogram: No  Medications: 9 mL bupivacaine 0.5 %; 40 mg methylPREDNISolone acetate 40 MG/ML; 5 mL lidocaine 1 % Outcome: tolerated well, no immediate complications Procedure, treatment alternatives, risks and benefits explained, specific risks discussed. Consent was given by the patient. Immediately prior to procedure a time out was called to verify the correct patient, procedure, equipment, support staff and site/side marked as required. Patient was prepped and draped in the usual sterile fashion.       Clinical Data: No additional findings.  Objective: Vital Signs: There were no vitals taken for this visit.  Physical Exam:   Constitutional: Patient appears well-developed HEENT:  Head: Normocephalic Eyes:EOM are normal Neck: Normal range of motion Cardiovascular: Normal rate Pulmonary/chest: Effort normal Neurologic: Patient is alert Skin: Skin is warm Psychiatric: Patient has normal mood and affect    Ortho Exam: Ortho exam demonstrates no groin pain with internal extra rotation of either leg.  Patient has good hip flexion strength with palpable pedal pulses and symmetric reflexes.  Ankle dorsiflexion plantarflexion strength is 5+ out of 5 along with quad hamstring and hip flexion strength.  Mild pain with forward lateral bending.  No  masses lymphadenopathy or skin changes noted in that hip region.  Bilateral shoulders demonstrate coarse grinding and crepitus with active and passive range of motion with pretty reasonable subscap strength bilaterally.  Infraspinatus supraspinatus strength slightly diminished on the right.  Neck range of motion full.  Specialty Comments:  No specialty comments available.  Imaging: No results found.   PMFS History: Patient Active Problem List   Diagnosis Date Noted  . Chronic  venous insufficiency 02/29/2020  . Pain in joint, ankle and foot 02/29/2020  . Spondylosis without myelopathy or radiculopathy, lumbar region 01/05/2019  . Foraminal stenosis of lumbar region 01/05/2019  . Arthritis 06/08/2018  . GERD (gastroesophageal reflux disease) 06/08/2018  . Macular degeneration 06/08/2018  . Sleep apnea 06/08/2018  . Trouble swallowing 06/08/2018   Past Medical History:  Diagnosis Date  . Macular degeneration   . Sleep apnea     Family History  Problem Relation Age of Onset  . Heart disease Mother   . Cerebral aneurysm Father     No past surgical history on file. Social History   Occupational History  . Not on file  Tobacco Use  . Smoking status: Current Every Day Smoker  . Smokeless tobacco: Never Used  Substance and Sexual Activity  . Alcohol use: Never  . Drug use: Never  . Sexual activity: Not on file

## 2020-09-23 ENCOUNTER — Other Ambulatory Visit: Payer: Self-pay

## 2020-09-23 ENCOUNTER — Ambulatory Visit
Admission: RE | Admit: 2020-09-23 | Discharge: 2020-09-23 | Disposition: A | Discharge status: Home / Self Care | Payer: Medicare Other | Source: Ambulatory Visit | Attending: Physician Assistant | Admitting: Physician Assistant

## 2020-09-23 ENCOUNTER — Other Ambulatory Visit: Payer: Self-pay | Admitting: Physician Assistant

## 2020-09-23 DIAGNOSIS — R928 Other abnormal and inconclusive findings on diagnostic imaging of breast: Secondary | ICD-10-CM

## 2020-10-17 ENCOUNTER — Ambulatory Visit (INDEPENDENT_AMBULATORY_CARE_PROVIDER_SITE_OTHER): Payer: Medicare Other | Admitting: Physical Medicine and Rehabilitation

## 2020-10-17 ENCOUNTER — Encounter: Payer: Self-pay | Admitting: Physical Medicine and Rehabilitation

## 2020-10-17 ENCOUNTER — Other Ambulatory Visit: Payer: Self-pay

## 2020-10-17 ENCOUNTER — Ambulatory Visit: Payer: Self-pay

## 2020-10-17 VITALS — BP 162/84 | HR 79

## 2020-10-17 DIAGNOSIS — M47816 Spondylosis without myelopathy or radiculopathy, lumbar region: Secondary | ICD-10-CM | POA: Diagnosis not present

## 2020-10-17 MED ORDER — BETAMETHASONE SOD PHOS & ACET 6 (3-3) MG/ML IJ SUSP
12.0000 mg | Freq: Once | INTRAMUSCULAR | Status: AC
  Administered 2020-10-17: 12 mg

## 2020-10-17 NOTE — Progress Notes (Signed)
Pt state lower back pain that travels down to her left buttocks. Pt state walking for a long period of time makes the pain worse. Pt state she takes pain meds to help ease the pain.  Numeric Pain Rating Scale and Functional Assessment Average Pain 8   In the last MONTH (on 0-10 scale) has pain interfered with the following?  1. General activity like being  able to carry out your everyday physical activities such as walking, climbing stairs, carrying groceries, or moving a chair?  Rating(10)   +Driver, -BT, -Dye Allergies.

## 2020-10-18 ENCOUNTER — Ambulatory Visit: Payer: Medicare Other | Admitting: Surgical

## 2020-11-09 HISTORY — PX: BREAST BIOPSY: SHX20

## 2020-11-24 ENCOUNTER — Other Ambulatory Visit: Payer: Self-pay | Admitting: Physical Medicine and Rehabilitation

## 2020-11-26 NOTE — Telephone Encounter (Signed)
Please advise 

## 2020-11-27 ENCOUNTER — Ambulatory Visit: Payer: Medicare Other | Admitting: Orthopedic Surgery

## 2020-12-05 ENCOUNTER — Encounter (HOSPITAL_COMMUNITY): Payer: Self-pay | Admitting: Emergency Medicine

## 2020-12-05 ENCOUNTER — Ambulatory Visit: Payer: Medicare Other | Admitting: Orthopedic Surgery

## 2020-12-05 ENCOUNTER — Emergency Department (HOSPITAL_COMMUNITY)
Admission: EM | Admit: 2020-12-05 | Discharge: 2020-12-05 | Disposition: A | Discharge status: Home / Self Care | Payer: No Typology Code available for payment source | Attending: Emergency Medicine | Admitting: Emergency Medicine

## 2020-12-05 ENCOUNTER — Emergency Department (HOSPITAL_COMMUNITY): Payer: No Typology Code available for payment source

## 2020-12-05 ENCOUNTER — Other Ambulatory Visit: Payer: Self-pay

## 2020-12-05 DIAGNOSIS — M545 Low back pain, unspecified: Secondary | ICD-10-CM | POA: Insufficient documentation

## 2020-12-05 DIAGNOSIS — M25531 Pain in right wrist: Secondary | ICD-10-CM | POA: Insufficient documentation

## 2020-12-05 DIAGNOSIS — G8929 Other chronic pain: Secondary | ICD-10-CM | POA: Diagnosis not present

## 2020-12-05 DIAGNOSIS — F17200 Nicotine dependence, unspecified, uncomplicated: Secondary | ICD-10-CM | POA: Diagnosis not present

## 2020-12-05 DIAGNOSIS — M25559 Pain in unspecified hip: Secondary | ICD-10-CM | POA: Diagnosis not present

## 2020-12-05 DIAGNOSIS — Z7982 Long term (current) use of aspirin: Secondary | ICD-10-CM | POA: Insufficient documentation

## 2020-12-05 DIAGNOSIS — Y92410 Unspecified street and highway as the place of occurrence of the external cause: Secondary | ICD-10-CM | POA: Diagnosis not present

## 2020-12-05 DIAGNOSIS — M25571 Pain in right ankle and joints of right foot: Secondary | ICD-10-CM | POA: Insufficient documentation

## 2020-12-05 MED ORDER — ACETAMINOPHEN 500 MG PO TABS
1000.0000 mg | ORAL_TABLET | Freq: Once | ORAL | Status: AC
  Administered 2020-12-05: 1000 mg via ORAL
  Filled 2020-12-05: qty 2

## 2020-12-05 NOTE — Discharge Instructions (Addendum)
Take Tylenol as needed for pain. Continue taking home medications as prescribed. Follow-up with Dr. Marlou Sa as needed for further evaluation. Return to the emergency room with any new, worsening, or concerning symptoms
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
## 2020-12-16 ENCOUNTER — Ambulatory Visit (INDEPENDENT_AMBULATORY_CARE_PROVIDER_SITE_OTHER): Payer: Medicare Other | Admitting: Surgical

## 2020-12-16 ENCOUNTER — Other Ambulatory Visit: Payer: Self-pay

## 2020-12-16 DIAGNOSIS — M542 Cervicalgia: Secondary | ICD-10-CM | POA: Diagnosis not present

## 2020-12-16 DIAGNOSIS — M5410 Radiculopathy, site unspecified: Secondary | ICD-10-CM | POA: Diagnosis not present

## 2020-12-16 MED ORDER — PREDNISONE 5 MG (21) PO TBPK: ORAL_TABLET | ORAL | 0 refills | Status: DC

## 2020-12-18 NOTE — Procedures (Signed)
Lumbar Diagnostic Facet Joint Nerve Block with Fluoroscopic Guidance   Patient: Alicia [name]      Date of Birth: [birth year] MRN: [id number] PCP: Pcp, No      Visit Date: 10/17/2020   Universal Protocol:    Date/Time: 02/09/225:40 AM  Consent Given By: the patient  Position: PRONE  Additional Comments: Vital signs were monitored before and after the procedure. Patient was prepped and draped in the usual sterile fashion. The correct patient, procedure, and site was verified.   Injection Procedure Details:   Procedure diagnoses:  1. Spondylosis without myelopathy or radiculopathy, lumbar region      Meds Administered:  Meds ordered this encounter  Medications  . betamethasone acetate-betamethasone sodium phosphate (CELESTONE) injection 12 mg     Laterality: Bilateral  Location/Site: L5-S1, L4 medial branch and L5 dorsal ramus  Needle: 5.0 in., 25 ga.  Short bevel or Quincke spinal needle  Needle Placement: Oblique pedical  Findings:   -Comments: There was excellent flow of contrast along the articular pillars without intravascular flow.  Procedure Details: The fluoroscope beam is vertically oriented in AP and then obliqued 15 to 20 degrees to the ipsilateral side of the desired nerve to achieve the "Scotty dog" appearance.  The skin over the target area of the junction of the superior articulating process and the transverse process (sacral ala if blocking the L5 dorsal rami) was locally anesthetized with a 1 ml volume of 1% Lidocaine without Epinephrine.  The spinal needle was inserted and advanced in a trajectory view down to the target.   After contact with periosteum and negative aspirate for blood and CSF, correct placement without intravascular or epidural spread was confirmed by injecting 0.5 ml. of Isovue-250.  A spot radiograph was obtained of this image.    Next, a 0.5 ml. volume of the injectate described above was injected. The needle was then  redirected to the other facet joint nerves mentioned above if needed.  Prior to the procedure, the patient was given a Pain Diary which was completed for baseline measurements.  After the procedure, the patient rated their pain every 30 minutes and will continue rating at this frequency for a total of 5 hours.  The patient has been asked to complete the Diary and return to [place] by mail, fax or hand delivered as soon as possible.   Additional Comments:  The patient tolerated the procedure well Dressing: 2 x 2 sterile gauze and Band-Aid    Post-procedure details: Patient was observed during the procedure. Post-procedure instructions were reviewed.  Patient left the clinic in stable condition.

## 2020-12-18 NOTE — Progress Notes (Signed)
Alicia [name] - 76 y.o. female MRN [id number]  Date of birth: 1946/09/09  Office Visit Note: Visit Date: 10/17/2020 PCP: Pcp, No Referred by: Doretha Sou*  Subjective: Chief Complaint  Patient presents with  . Lower Back - Pain   HPI:  Alicia [name] is a 75 y.o. female who comes in today for planned repeat Bilateral L5-S1 Lumbar facet/medial branch block with fluoroscopic guidance.  The patient has failed conservative care including home exercise, medications, time and activity modification.  This injection will be diagnostic and hopefully therapeutic.  Please see requesting physician notes for further details and justification.  Exam shows concordant low back pain with facet joint loading and extension. Patient received more than 80% pain relief from prior injection. This would be the second block in a diagnostic double block paradigm.     Referring:Dr. Landry Dyke Dean    ROS Otherwise per HPI.  Assessment & Plan: Visit Diagnoses:    ICD-10-CM   1. Spondylosis without myelopathy or radiculopathy, lumbar region  M47.816 XR C-ARM NO REPORT    Facet Injection    betamethasone acetate-betamethasone sodium phosphate (CELESTONE) injection 12 mg    Plan: No additional findings.   Meds & Orders:  Meds ordered this encounter  Medications  . betamethasone acetate-betamethasone sodium phosphate (CELESTONE) injection 12 mg    Orders Placed This Encounter  Procedures  . Facet Injection  . XR C-ARM NO REPORT    Follow-up: Return if symptoms worsen or fail to improve.   Procedures: No procedures performed  Lumbar Diagnostic Facet Joint Nerve Block with Fluoroscopic Guidance   Patient: Alicia [name]      Date of Birth: 07/12/1946 MRN: [id number] PCP: Pcp, No      Visit Date: 10/17/2020   Universal Protocol:    Date/Time: 02/09/225:40 AM  Consent Given By: the patient  Position: PRONE  Additional Comments: Vital signs were monitored before  and after the procedure. Patient was prepped and draped in the usual sterile fashion. The correct patient, procedure, and site was verified.   Injection Procedure Details:   Procedure diagnoses:  1. Spondylosis without myelopathy or radiculopathy, lumbar region      Meds Administered:  Meds ordered this encounter  Medications  . betamethasone acetate-betamethasone sodium phosphate (CELESTONE) injection 12 mg     Laterality: Bilateral  Location/Site: L5-S1, L4 medial branch and L5 dorsal ramus  Needle: 5.0 in., 25 ga.  Short bevel or Quincke spinal needle  Needle Placement: Oblique pedical  Findings:   -Comments: There was excellent flow of contrast along the articular pillars without intravascular flow.  Procedure Details: The fluoroscope beam is vertically oriented in AP and then obliqued 15 to 20 degrees to the ipsilateral side of the desired nerve to achieve the "Scotty dog" appearance.  The skin over the target area of the junction of the superior articulating process and the transverse process (sacral ala if blocking the L5 dorsal rami) was locally anesthetized with a 1 ml volume of 1% Lidocaine without Epinephrine.  The spinal needle was inserted and advanced in a trajectory view down to the target.   After contact with periosteum and negative aspirate for blood and CSF, correct placement without intravascular or epidural spread was confirmed by injecting 0.5 ml. of Isovue-250.  A spot radiograph was obtained of this image.    Next, a 0.5 ml. volume of the injectate described above was injected. The needle was then redirected to the other facet joint nerves mentioned above if  needed.  Prior to the procedure, the patient was given a Pain Diary which was completed for baseline measurements.  After the procedure, the patient rated their pain every 30 minutes and will continue rating at this frequency for a total of 5 hours.  The patient has been asked to complete the Diary and  return to [place] by mail, fax or hand delivered as soon as possible.   Additional Comments:  The patient tolerated the procedure well Dressing: 2 x 2 sterile gauze and Band-Aid    Post-procedure details: Patient was observed during the procedure. Post-procedure instructions were reviewed.  Patient left the clinic in stable condition.     Clinical History: MRI LUMBAR SPINE WITHOUT CONTRAST  TECHNIQUE: Multiplanar, multisequence MR imaging of the lumbar spine was performed. No intravenous contrast was administered.  COMPARISON:  Radiographs from 05/17/2019 and MRI from 10/06/2017  FINDINGS: Segmentation: The lowest lumbar type non-rib-bearing vertebra is labeled as L5.  Alignment: Levoconvex lumbar scoliosis. 3 mm grade 1 degenerative anterolisthesis at L3-4.  Vertebrae: Transverse fracture through the upper S3 vertebra. Vertical fractures through the sacral ala. Associated marrow edema noted.  Degenerative endplate findings eccentric to the right at L2-3. Type 1 degenerative endplate findings are present eccentric to the left at L4-5. A 1.3 cm hypointensity eccentric to the left in the L3 vertebral body is probably from degenerative endplate findings and has low T1 and mildly reduced T2 signal.  Conus medullaris and cauda equina: Conus extends to the T12-L1 level. Conus and cauda equina appear normal.  Paraspinal and other soft tissues: Unremarkable  Disc levels:  L1-2: Unremarkable.  L2-3: Moderate right foraminal impingement and mild right subarticular lateral recess stenosis due to disc bulge, intervertebral spurring, and facet spurring.  L3-4: Mild to moderate right foraminal stenosis and mild right subarticular lateral recess stenosis due to disc bulge, facet arthropathy, and intervertebral spurring.  L4-5: Moderate to prominent left foraminal stenosis and moderate left and borderline right subarticular lateral recess stenosis due to left  eccentric disc bulge, facet arthropathy, and intervertebral spurring.  L5-S1: Moderate left and mild right foraminal stenosis due to disc bulge, intervertebral spurring, and facet arthropathy.  IMPRESSION: 1. Sacral insufficiency fracture with vertical fractures through the sacral ala and a transverse sacral fracture at the S3 level. Presacral edema noted. 2. Lumbar spondylosis, scoliosis, and degenerative disc disease causing moderate to prominent impingement at L4-5; moderate impingement at L2-3 and L5-S1; and mild to moderate impingement at L3-4, as detailed above.   Electronically Signed   By: Van Clines M.D.   On: 06/23/2019 15:39     Objective:  VS:  HT:    WT:   BMI:     BP:(!) 162/84  HR:79bpm  TEMP: ( )  RESP:  Physical Exam Vitals and nursing note reviewed.  Constitutional:      General: She is not in acute distress.    Appearance: Normal appearance. She is obese. She is not ill-appearing.  HENT:     Head: Normocephalic and atraumatic.     Right Ear: External ear normal.     Left Ear: External ear normal.  Eyes:     Extraocular Movements: Extraocular movements intact.  Cardiovascular:     Rate and Rhythm: Normal rate.     Pulses: Normal pulses.  Pulmonary:     Effort: Pulmonary effort is normal. No respiratory distress.  Abdominal:     General: There is no distension.     Palpations: Abdomen is soft.  Musculoskeletal:  General: Tenderness present.     Cervical back: Neck supple.     Right lower leg: No edema.     Left lower leg: No edema.     Comments: Patient has good distal strength with no pain over the greater trochanters.  No clonus or focal weakness. Patient somewhat slow to rise from a seated position to full extension.  There is concordant low back pain with facet loading and lumbar spine extension rotation.  There are no definitive trigger points but the patient is somewhat tender across the lower back and PSIS.  There is no  pain with hip rotation.  Skin:    Findings: No erythema, lesion or rash.  Neurological:     General: No focal deficit present.     Mental Status: She is alert and oriented to person, place, and time.     Sensory: No sensory deficit.     Motor: No weakness or abnormal muscle tone.     Coordination: Coordination normal.  Psychiatric:        Mood and Affect: Mood normal.        Behavior: Behavior normal.      Imaging: No results found.

## 2020-12-23 ENCOUNTER — Ambulatory Visit: Payer: Medicare Other | Admitting: Orthopedic Surgery

## 2020-12-26 ENCOUNTER — Encounter: Payer: Self-pay | Admitting: Surgical
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
## 2020-12-31 ENCOUNTER — Encounter: Payer: Self-pay | Admitting: Physical Medicine and Rehabilitation

## 2020-12-31 ENCOUNTER — Ambulatory Visit (INDEPENDENT_AMBULATORY_CARE_PROVIDER_SITE_OTHER): Payer: Medicare Other | Admitting: Physical Medicine and Rehabilitation

## 2020-12-31 ENCOUNTER — Ambulatory Visit: Payer: Self-pay

## 2020-12-31 ENCOUNTER — Other Ambulatory Visit: Payer: Self-pay

## 2020-12-31 VITALS — BP 149/78 | HR 84

## 2020-12-31 DIAGNOSIS — M5416 Radiculopathy, lumbar region: Secondary | ICD-10-CM | POA: Diagnosis not present

## 2020-12-31 MED ORDER — BETAMETHASONE SOD PHOS & ACET 6 (3-3) MG/ML IJ SUSP
12.0000 mg | Freq: Once | INTRAMUSCULAR | Status: AC
  Administered 2020-12-31: 12 mg
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
## 2020-12-31 NOTE — Patient Instructions (Signed)

## 2021-01-14 ENCOUNTER — Ambulatory Visit (INDEPENDENT_AMBULATORY_CARE_PROVIDER_SITE_OTHER): Payer: Medicare Other | Admitting: Physical Medicine and Rehabilitation

## 2021-01-14 ENCOUNTER — Other Ambulatory Visit: Payer: Self-pay

## 2021-01-14 ENCOUNTER — Ambulatory Visit: Payer: Self-pay

## 2021-01-14 ENCOUNTER — Encounter: Payer: Self-pay | Admitting: Physical Medicine and Rehabilitation

## 2021-01-14 VITALS — BP 156/82 | HR 87

## 2021-01-14 DIAGNOSIS — M5412 Radiculopathy, cervical region: Secondary | ICD-10-CM | POA: Diagnosis not present

## 2021-01-14 MED ORDER — BETAMETHASONE SOD PHOS & ACET 6 (3-3) MG/ML IJ SUSP
12.0000 mg | Freq: Once | INTRAMUSCULAR | Status: AC
  Administered 2021-01-14: 12 mg

## 2021-01-14 NOTE — Progress Notes (Signed)
Pt state neck pain. Pt state anything makes the pain worse. Pt state she take over the counter pain meds to help ease the pain.  Numeric Pain Rating Scale and Functional Assessment Average Pain 8   In the last MONTH (on 0-10 scale) has pain interfered with the following?  1. General activity like being  able to carry out your everyday physical activities such as walking, climbing stairs, carrying groceries, or moving a chair?  Rating(7)   +Driver, -BT, -Dye Allergies.

## 2021-01-14 NOTE — Patient Instructions (Signed)

## 2021-01-15 NOTE — Progress Notes (Signed)
Alicia [name] - 75 y.o. female MRN [id number]  Date of birth: 1945-12-22  Office Visit Note: Visit Date: 01/14/2021 PCP: Pcp, No Referred by: Donella Stade, PA-C  Subjective: Chief Complaint  Patient presents with  . Neck - Pain   HPI:  Alicia [name] is a 75 y.o. female who comes in today at the request of Annie Main, PA-C for planned Right C7-T1 Cervical epidural steroid injection with fluoroscopic guidance.  The patient has failed conservative care including home exercise, medications, time and activity modification.  This injection will be diagnostic and hopefully therapeutic.  Please see requesting physician notes for further details and justification.  She reports prior cervical injection back in December gave her quite a bit of relief but she really has poor insight into what could ask to be causing a lot of her pain generators.  She also got relief of shoulder pain with prior shoulder injection by Dr. Marlou Sa.  She really confuses the fact that she potentially has some pain from the cervical spine and some pain from the shoulder and she clearly has myofascial pain syndrome with trigger points as well.  Pain is multifactorial and we had a long talk about that today.  She likely would benefit from dry needling and physical therapy.   ROS Otherwise per HPI.  Assessment & Plan: Visit Diagnoses:    ICD-10-CM   1. Cervical radiculopathy  M54.12 XR C-ARM NO REPORT    Epidural Steroid injection    betamethasone acetate-betamethasone sodium phosphate (CELESTONE) injection 12 mg    Plan: No additional findings.   Meds & Orders:  Meds ordered this encounter  Medications  . betamethasone acetate-betamethasone sodium phosphate (CELESTONE) injection 12 mg    Orders Placed This Encounter  Procedures  . XR C-ARM NO REPORT  . Epidural Steroid injection    Follow-up: Return if symptoms worsen or fail to improve.   Procedures: No procedures performed  Cervical Epidural  Steroid Injection - Interlaminar Approach with Fluoroscopic Guidance  Patient: Alicia [name]      Date of Birth: 01-Nov-1946 MRN: [id number] PCP: Pcp, No      Visit Date: 01/14/2021   Universal Protocol:    Date/Time: 03/09/224:43 AM  Consent Given By: the patient  Position: PRONE  Additional Comments: Vital signs were monitored before and after the procedure. Patient was prepped and draped in the usual sterile fashion. The correct patient, procedure, and site was verified.   Injection Procedure Details:   Procedure diagnoses: Cervical radiculopathy [M54.12]    Meds Administered:  Meds ordered this encounter  Medications  . betamethasone acetate-betamethasone sodium phosphate (CELESTONE) injection 12 mg     Laterality: Right  Location/Site: C7-T1  Needle: 3.5 in., 20 ga. Tuohy  Needle Placement: Paramedian epidural space  Findings:  -Comments: Excellent flow of contrast into the epidural space.  Procedure Details: Using a paramedian approach from the side mentioned above, the region overlying the inferior lamina was localized under fluoroscopic visualization and the soft tissues overlying this structure were infiltrated with 4 ml. of 1% Lidocaine without Epinephrine. A # 20 gauge, Tuohy needle was inserted into the epidural space using a paramedian approach.  The epidural space was localized using loss of resistance along with contralateral oblique bi-planar fluoroscopic views.  After negative aspirate for air, blood, and CSF, a 2 ml. volume of Isovue-250 was injected into the epidural space and the flow of contrast was observed. Radiographs were obtained for documentation purposes.   The injectate was  administered into the level noted above.  Additional Comments:  The patient tolerated the procedure well Dressing: 2 x 2 sterile gauze and Band-Aid    Post-procedure details: Patient was observed during the procedure. Post-procedure instructions were  reviewed.  Patient left the clinic in stable condition.     Clinical History: MRI LUMBAR SPINE WITHOUT CONTRAST  TECHNIQUE: Multiplanar, multisequence MR imaging of the lumbar spine was performed. No intravenous contrast was administered.  COMPARISON:  Radiographs from 05/17/2019 and MRI from 10/06/2017  FINDINGS: Segmentation: The lowest lumbar type non-rib-bearing vertebra is labeled as L5.  Alignment: Levoconvex lumbar scoliosis. 3 mm grade 1 degenerative anterolisthesis at L3-4.  Vertebrae: Transverse fracture through the upper S3 vertebra. Vertical fractures through the sacral ala. Associated marrow edema noted.  Degenerative endplate findings eccentric to the right at L2-3. Type 1 degenerative endplate findings are present eccentric to the left at L4-5. A 1.3 cm hypointensity eccentric to the left in the L3 vertebral body is probably from degenerative endplate findings and has low T1 and mildly reduced T2 signal.  Conus medullaris and cauda equina: Conus extends to the T12-L1 level. Conus and cauda equina appear normal.  Paraspinal and other soft tissues: Unremarkable  Disc levels:  L1-2: Unremarkable.  L2-3: Moderate right foraminal impingement and mild right subarticular lateral recess stenosis due to disc bulge, intervertebral spurring, and facet spurring.  L3-4: Mild to moderate right foraminal stenosis and mild right subarticular lateral recess stenosis due to disc bulge, facet arthropathy, and intervertebral spurring.  L4-5: Moderate to prominent left foraminal stenosis and moderate left and borderline right subarticular lateral recess stenosis due to left eccentric disc bulge, facet arthropathy, and intervertebral spurring.  L5-S1: Moderate left and mild right foraminal stenosis due to disc bulge, intervertebral spurring, and facet arthropathy.  IMPRESSION: 1. Sacral insufficiency fracture with vertical fractures through  the sacral ala and a transverse sacral fracture at the S3 level. Presacral edema noted. 2. Lumbar spondylosis, scoliosis, and degenerative disc disease causing moderate to prominent impingement at L4-5; moderate impingement at L2-3 and L5-S1; and mild to moderate impingement at L3-4, as detailed above.   Electronically Signed   By: Van Clines M.D.   On: 06/23/2019 15:39     Objective:  VS:  HT:    WT:   BMI:     BP:(!) 156/82  HR:87bpm  TEMP: ( )  RESP:  Physical Exam Vitals and nursing note reviewed.  Constitutional:      General: She is not in acute distress.    Appearance: Normal appearance. She is not ill-appearing.  HENT:     Head: Normocephalic and atraumatic.     Right Ear: External ear normal.     Left Ear: External ear normal.  Eyes:     Extraocular Movements: Extraocular movements intact.  Cardiovascular:     Rate and Rhythm: Normal rate.     Pulses: Normal pulses.  Musculoskeletal:     Cervical back: Tenderness present. No rigidity.     Right lower leg: No edema.     Left lower leg: No edema.     Comments: Patient has good strength in the upper extremities including 5 out of 5 strength in wrist extension long finger flexion and APB.  There is no atrophy of the hands intrinsically.  There is a negative Hoffmann's test.  She has difficulty with right shoulder abduction and forward flexion.  She has focal trigger points in the levator scapula rhomboid and trapezius on the right.  Lymphadenopathy:     Cervical: No cervical adenopathy.  Skin:    Findings: No erythema, lesion or rash.  Neurological:     General: No focal deficit present.     Mental Status: She is alert and oriented to person, place, and time.     Sensory: No sensory deficit.     Motor: No weakness or abnormal muscle tone.     Coordination: Coordination normal.  Psychiatric:        Mood and Affect: Mood normal.        Behavior: Behavior normal.      Imaging: XR C-ARM NO  REPORT  Result Date: 01/14/2021 Please see Notes tab for imaging impression.

## 2021-01-15 NOTE — Procedures (Signed)
Cervical Epidural Steroid Injection - Interlaminar Approach with Fluoroscopic Guidance  Patient: Alicia [name]      Date of Birth: 08/16/1946 MRN: [id number] PCP: Pcp, No      Visit Date: 01/14/2021   Universal Protocol:    Date/Time: 03/09/224:43 AM  Consent Given By: the patient  Position: PRONE  Additional Comments: Vital signs were monitored before and after the procedure. Patient was prepped and draped in the usual sterile fashion. The correct patient, procedure, and site was verified.   Injection Procedure Details:   Procedure diagnoses: Cervical radiculopathy [M54.12]    Meds Administered:  Meds ordered this encounter  Medications  . betamethasone acetate-betamethasone sodium phosphate (CELESTONE) injection 12 mg     Laterality: Right  Location/Site: C7-T1  Needle: 3.5 in., 20 ga. Tuohy  Needle Placement: Paramedian epidural space  Findings:  -Comments: Excellent flow of contrast into the epidural space.  Procedure Details: Using a paramedian approach from the side mentioned above, the region overlying the inferior lamina was localized under fluoroscopic visualization and the soft tissues overlying this structure were infiltrated with 4 ml. of 1% Lidocaine without Epinephrine. A # 20 gauge, Tuohy needle was inserted into the epidural space using a paramedian approach.  The epidural space was localized using loss of resistance along with contralateral oblique bi-planar fluoroscopic views.  After negative aspirate for air, blood, and CSF, a 2 ml. volume of Isovue-250 was injected into the epidural space and the flow of contrast was observed. Radiographs were obtained for documentation purposes.   The injectate was administered into the level noted above.  Additional Comments:  The patient tolerated the procedure well Dressing: 2 x 2 sterile gauze and Band-Aid    Post-procedure details: Patient was observed during the procedure. Post-procedure  instructions were reviewed.  Patient left the clinic in stable condition.

## 2021-01-15 NOTE — Procedures (Signed)
Lumbar Epidural Steroid Injection - Interlaminar Approach with Fluoroscopic Guidance  Patient: Alicia [name]      Date of Birth: Aug 12, 1946 MRN: [id number] PCP: Pcp, No      Visit Date: 12/31/2020   Universal Protocol:     Consent Given By: the patient  Position: PRONE  Additional Comments: Vital signs were monitored before and after the procedure. Patient was prepped and draped in the usual sterile fashion. The correct patient, procedure, and site was verified.   Injection Procedure Details:   Procedure diagnoses: Lumbar radiculopathy [M54.16]   Meds Administered:  Meds ordered this encounter  Medications  . betamethasone acetate-betamethasone sodium phosphate (CELESTONE) injection 12 mg     Laterality: Left  Location/Site:  L4-L5  Needle: 4.5 in., 20 ga. Tuohy  Needle Placement: Paramedian epidural  Findings:   -Comments: Excellent flow of contrast into the epidural space.  Procedure Details: Using a paramedian approach from the side mentioned above, the region overlying the inferior lamina was localized under fluoroscopic visualization and the soft tissues overlying this structure were infiltrated with 4 ml. of 1% Lidocaine without Epinephrine. The Tuohy needle was inserted into the epidural space using a paramedian approach.   The epidural space was localized using loss of resistance along with counter oblique bi-planar fluoroscopic views.  After negative aspirate for air, blood, and CSF, a 2 ml. volume of Isovue-250 was injected into the epidural space and the flow of contrast was observed. Radiographs were obtained for documentation purposes.    The injectate was administered into the level noted above.   Additional Comments:  The patient tolerated the procedure well Dressing: 2 x 2 sterile gauze and Band-Aid    Post-procedure details: Patient was observed during the procedure. Post-procedure instructions were reviewed.  Patient left the clinic in  stable condition.

## 2021-01-15 NOTE — Progress Notes (Signed)
Alicia [name] - 75 y.o. female MRN [id number]  Date of birth: Feb 21, 1946  Office Visit Note: Visit Date: 12/31/2020 PCP: Pcp, No Referred by: Donella Stade, PA-C  Subjective: Chief Complaint  Patient presents with  . Lower Back - Pain  . Left Leg - Pain  . Right Leg - Pain   HPI:  Alicia [name] is a 75 y.o. female who comes in today at the request of Annie Main, PA-C for planned Left L4-L5 Lumbar epidural steroid injection with fluoroscopic guidance.  The patient has failed conservative care including home exercise, medications, time and activity modification.  This injection will be diagnostic and hopefully therapeutic.  Please see requesting physician notes for further details and justification. MRI reviewed with images and spine model.  MRI reviewed in the note below.  I completed facet joint blocks back in December that she said helped for a couple of days.  Despite ongoing consultation with her at each visit she really failed to follow back up with [place] concerning the amount of relief.  We were looking at potential for down the road radiofrequency ablation.  She is having some referral pattern in the legs at this point is little bit different.  MRI has shown multilevel facet arthropathy with some lateral recess narrowing no high-grade central stenosis.   ROS Otherwise per HPI.  Assessment & Plan: Visit Diagnoses:    ICD-10-CM   1. Lumbar radiculopathy  M54.16 XR C-ARM NO REPORT    Epidural Steroid injection    betamethasone acetate-betamethasone sodium phosphate (CELESTONE) injection 12 mg    Plan: No additional findings.   Meds & Orders:  Meds ordered this encounter  Medications  . betamethasone acetate-betamethasone sodium phosphate (CELESTONE) injection 12 mg    Orders Placed This Encounter  Procedures  . XR C-ARM NO REPORT  . Epidural Steroid injection    Follow-up: Return if symptoms worsen or fail to improve.   Procedures: No procedures  performed  Lumbar Epidural Steroid Injection - Interlaminar Approach with Fluoroscopic Guidance  Patient: Alicia [name]      Date of Birth: 02/15/1946 MRN: [id number] PCP: Pcp, No      Visit Date: 12/31/2020   Universal Protocol:     Consent Given By: the patient  Position: PRONE  Additional Comments: Vital signs were monitored before and after the procedure. Patient was prepped and draped in the usual sterile fashion. The correct patient, procedure, and site was verified.   Injection Procedure Details:   Procedure diagnoses: Lumbar radiculopathy [M54.16]   Meds Administered:  Meds ordered this encounter  Medications  . betamethasone acetate-betamethasone sodium phosphate (CELESTONE) injection 12 mg     Laterality: Left  Location/Site:  L4-L5  Needle: 4.5 in., 20 ga. Tuohy  Needle Placement: Paramedian epidural  Findings:   -Comments: Excellent flow of contrast into the epidural space.  Procedure Details: Using a paramedian approach from the side mentioned above, the region overlying the inferior lamina was localized under fluoroscopic visualization and the soft tissues overlying this structure were infiltrated with 4 ml. of 1% Lidocaine without Epinephrine. The Tuohy needle was inserted into the epidural space using a paramedian approach.   The epidural space was localized using loss of resistance along with counter oblique bi-planar fluoroscopic views.  After negative aspirate for air, blood, and CSF, a 2 ml. volume of Isovue-250 was injected into the epidural space and the flow of contrast was observed. Radiographs were obtained for documentation purposes.    The injectate  was administered into the level noted above.   Additional Comments:  The patient tolerated the procedure well Dressing: 2 x 2 sterile gauze and Band-Aid    Post-procedure details: Patient was observed during the procedure. Post-procedure instructions were reviewed.  Patient left  the clinic in stable condition.     Clinical History: MRI LUMBAR SPINE WITHOUT CONTRAST  TECHNIQUE: Multiplanar, multisequence MR imaging of the lumbar spine was performed. No intravenous contrast was administered.  COMPARISON:  Radiographs from 05/17/2019 and MRI from 10/06/2017  FINDINGS: Segmentation: The lowest lumbar type non-rib-bearing vertebra is labeled as L5.  Alignment: Levoconvex lumbar scoliosis. 3 mm grade 1 degenerative anterolisthesis at L3-4.  Vertebrae: Transverse fracture through the upper S3 vertebra. Vertical fractures through the sacral ala. Associated marrow edema noted.  Degenerative endplate findings eccentric to the right at L2-3. Type 1 degenerative endplate findings are present eccentric to the left at L4-5. A 1.3 cm hypointensity eccentric to the left in the L3 vertebral body is probably from degenerative endplate findings and has low T1 and mildly reduced T2 signal.  Conus medullaris and cauda equina: Conus extends to the T12-L1 level. Conus and cauda equina appear normal.  Paraspinal and other soft tissues: Unremarkable  Disc levels:  L1-2: Unremarkable.  L2-3: Moderate right foraminal impingement and mild right subarticular lateral recess stenosis due to disc bulge, intervertebral spurring, and facet spurring.  L3-4: Mild to moderate right foraminal stenosis and mild right subarticular lateral recess stenosis due to disc bulge, facet arthropathy, and intervertebral spurring.  L4-5: Moderate to prominent left foraminal stenosis and moderate left and borderline right subarticular lateral recess stenosis due to left eccentric disc bulge, facet arthropathy, and intervertebral spurring.  L5-S1: Moderate left and mild right foraminal stenosis due to disc bulge, intervertebral spurring, and facet arthropathy.  IMPRESSION: 1. Sacral insufficiency fracture with vertical fractures through the sacral ala and a transverse  sacral fracture at the S3 level. Presacral edema noted. 2. Lumbar spondylosis, scoliosis, and degenerative disc disease causing moderate to prominent impingement at L4-5; moderate impingement at L2-3 and L5-S1; and mild to moderate impingement at L3-4, as detailed above.   Electronically Signed   By: Van Clines M.D.   On: 06/23/2019 15:39     Objective:  VS:  HT:    WT:   BMI:     BP:(!) 149/78  HR:84bpm  TEMP: ( )  RESP:  Physical Exam Vitals and nursing note reviewed.  Constitutional:      General: She is not in acute distress.    Appearance: Normal appearance. She is obese. She is not ill-appearing.  HENT:     Head: Normocephalic and atraumatic.     Right Ear: External ear normal.     Left Ear: External ear normal.  Eyes:     Extraocular Movements: Extraocular movements intact.  Cardiovascular:     Rate and Rhythm: Normal rate.     Pulses: Normal pulses.  Pulmonary:     Effort: Pulmonary effort is normal. No respiratory distress.  Abdominal:     General: There is no distension.     Palpations: Abdomen is soft.  Musculoskeletal:        General: Tenderness present.     Cervical back: Neck supple.     Right lower leg: No edema.     Left lower leg: No edema.     Comments: Patient has good distal strength with no pain over the greater trochanters.  No clonus or focal weakness.  Skin:  Findings: No erythema, lesion or rash.  Neurological:     General: No focal deficit present.     Mental Status: She is alert and oriented to person, place, and time.     Sensory: No sensory deficit.     Motor: No weakness or abnormal muscle tone.     Coordination: Coordination normal.  Psychiatric:        Mood and Affect: Mood normal.        Behavior: Behavior normal.      Imaging: Epidural Steroid injection  Result Date: 01/14/2021 Magnus Sinning, MD     01/15/2021  4:44 AM Cervical Epidural Steroid Injection - Interlaminar Approach with Fluoroscopic Guidance  Patient: CALINDA STOCKINGER     Date of Birth: [birth year] MRN: [id number] PCP: Pcp, No     Visit Date: 01/14/2021  Universal Protocol:   Date/Time: 03/09/224:43 AM Consent Given By: the patient Position: PRONE Additional Comments: Vital signs were monitored before and after the procedure. Patient was prepped and draped in the usual sterile fashion. The correct patient, procedure, and site was verified. Injection Procedure Details: Procedure diagnoses: Cervical radiculopathy [M54.12]  Meds Administered: Meds ordered this encounter Medications . betamethasone acetate-betamethasone sodium phosphate (CELESTONE) injection 12 mg  Laterality: Right Location/Site: C7-T1 Needle: 3.5 in., 20 ga. Tuohy Needle Placement: Paramedian epidural space Findings:  -Comments: Excellent flow of contrast into the epidural space. Procedure Details: Using a paramedian approach from the side mentioned above, the region overlying the inferior lamina was localized under fluoroscopic visualization and the soft tissues overlying this structure were infiltrated with 4 ml. of 1% Lidocaine without Epinephrine. A # 20 gauge, Tuohy needle was inserted into the epidural space using a paramedian approach. The epidural space was localized using loss of resistance along with contralateral oblique bi-planar fluoroscopic views.  After negative aspirate for air, blood, and CSF, a 2 ml. volume of Isovue-250 was injected into the epidural space and the flow of contrast was observed. Radiographs were obtained for documentation purposes. The injectate was administered into the level noted above. Additional Comments: The patient tolerated the procedure well Dressing: 2 x 2 sterile gauze and Band-Aid  Post-procedure details: Patient was observed during the procedure. Post-procedure instructions were reviewed. Patient left the clinic in stable condition.   XR C-ARM NO REPORT  Result Date: 01/14/2021 Please see Notes tab for imaging impression.

## 2021-01-24 ENCOUNTER — Ambulatory Visit: Payer: Medicare Other | Admitting: Surgical

## 2021-01-29 ENCOUNTER — Other Ambulatory Visit: Payer: Self-pay

## 2021-01-29 ENCOUNTER — Ambulatory Visit (INDEPENDENT_AMBULATORY_CARE_PROVIDER_SITE_OTHER): Payer: Medicare Other | Admitting: Orthopedic Surgery

## 2021-01-29 ENCOUNTER — Ambulatory Visit: Payer: Self-pay

## 2021-01-29 ENCOUNTER — Encounter: Payer: Self-pay | Admitting: Orthopedic Surgery

## 2021-01-29 DIAGNOSIS — M65352 Trigger finger, left little finger: Secondary | ICD-10-CM

## 2021-01-29 DIAGNOSIS — M48061 Spinal stenosis, lumbar region without neurogenic claudication: Secondary | ICD-10-CM

## 2021-01-29 DIAGNOSIS — M65341 Trigger finger, right ring finger: Secondary | ICD-10-CM

## 2021-01-29 MED ORDER — BUPIVACAINE HCL 0.25 % IJ SOLN
0.3300 mL | INTRAMUSCULAR | Status: AC | PRN
  Administered 2021-01-29: .33 mL

## 2021-01-29 MED ORDER — METHYLPREDNISOLONE ACETATE 40 MG/ML IJ SUSP
13.3300 mg | INTRAMUSCULAR | Status: AC | PRN
  Administered 2021-01-29: 13.33 mg

## 2021-01-29 MED ORDER — LIDOCAINE HCL 1 % IJ SOLN
3.0000 mL | INTRAMUSCULAR | Status: AC | PRN
  Administered 2021-01-29: 3 mL

## 2021-01-29 NOTE — Progress Notes (Signed)
Office Visit Note   Patient: Alicia [name]           Date of Birth: October 22, 1946           MRN: [id number] Visit Date: 01/29/2021 Requested by: No referring provider defined for this encounter. PCP: Pcp, No  Subjective: Chief Complaint  Patient presents with  . Other    "I hurt all over"    HPI: [name] is a 75 year old patient with multiple orthopedic complaints today.  She describes several month history of right ring and left small finger triggering which is aggravating and mildly debilitating in terms of functional activities with the hand.  Fingers are locking multiple times on a daily basis.  Patient also states "I hurt all over and I have a list of complaints".  She reports bilateral leg pain which is radicular in nature worse on the left than the right which keeps the patient from walking and also diminishes her standing endurance.  She also reports right hip and groin pain with catching sensation.  Patient also reports right arm and shoulder pain.  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  Neck injection did well.  Injection into the lumbar spine only helped for 1 day.  Those old notes are reviewed.  When she is standing she reports radiating pain to the foot on the left-hand side and to the calf on the right-hand side.  Gets better when she sits.  Last scans are from 2020 on the lumbar spine which showed sacral insufficiency fractures as well as spondylosis.              ROS: All systems reviewed are negative as they relate to the chief complaint within the history of present illness.  Patient denies  fevers or chills.   Assessment & Plan: Visit Diagnoses:  1. Trigger finger, right ring finger   2. Trigger little finger of left hand   3. Spinal stenosis of lumbar region, unspecified whether neurogenic claudication present     Plan: Impression is worsening back pain to the point where she would consider surgical intervention.   Does not really seem like persistent sacral insufficiency fractures that looks more like spinal stenosis.  Plan repeat MRI lumbar spine with surgical consultation to follow.  Regarding the trigger fingers we did ultrasound-guided injections into the tendon sheath around the A1 pulley bilaterally today in the right ring and left small fingers.  Her shoulder is in a steady state of debilitation but is not time yet for another injection.  Her neck is fortunately improved with Dr. Romona Curls injection.  Follow-up after MRI scan. Follow-Up Instructions: Return for after MRI.   Orders:  Orders Placed This Encounter  Procedures  . US Guided Needle Placement - No Linked Charges  . MR Lumbar Spine w/o contrast   No orders of the defined types were placed in this encounter.     Procedures: Hand/UE Inj: R ring A1 for trigger finger on 01/29/2021 8:29 PM Indications: therapeutic Details: 25 G needle, ultrasound-guided volar approach Medications: 0.33 mL bupivacaine 0.25 %; 13.33 mg methylPREDNISolone acetate 40 MG/ML; 3 mL lidocaine 1 % Outcome: tolerated well, no immediate complications Procedure, treatment alternatives, risks and benefits explained, specific risks discussed. Consent was given by the patient. Immediately prior to procedure a time out was called to verify the correct patient, procedure, equipment, support staff and site/side marked as required. Patient was prepped and draped in the usual sterile fashion.  Hand/UE Inj: L small A1 for trigger finger on 01/29/2021 8:29 PM Indications: therapeutic Details: 25 G needle, ultrasound-guided volar approach Medications: 0.33 mL bupivacaine 0.25 %; 13.33 mg methylPREDNISolone acetate 40 MG/ML; 3 mL lidocaine 1 % Outcome: tolerated well, no immediate complications Procedure, treatment alternatives, risks and benefits explained, specific risks discussed. Consent was given by the patient. Immediately prior to procedure a time out was called to verify  the correct patient, procedure, equipment, support staff and site/side marked as required. Patient was prepped and draped in the usual sterile fashion.       Clinical Data: No additional findings.  Objective: Vital Signs: There were no vitals taken for this visit.  Physical Exam:   Constitutional: Patient appears well-developed HEENT:  Head: Normocephalic Eyes:EOM are normal Neck: Normal range of motion Cardiovascular: Normal rate Pulmonary/chest: Effort normal Neurologic: Patient is alert Skin: Skin is warm Psychiatric: Patient has normal mood and affect    Ortho Exam: Ortho examination of both hands demonstrates palpable radial pulses.  Does have tenderness of the A1 pulley right ring and left small finger.  Locking is present.  Grip strength otherwise intact.  Wrist range of motion full with no snuffbox tenderness.  No masses lymphadenopathy or skin changes noted in the wrist region.  Patient does have 5 out of 5 ankle dorsiflexion plantarflexion quad hamstring strength with good hip flexion strength 5+ out of 5 bilaterally.  Pretty reasonable hip range of motion without too much exacerbation of symptoms with internal rotation bilaterally.  No other masses lymphadenopathy or skin changes noted in the knee or ankle region.  Pedal pulses palpable.  Shoulder does have painful range of motion but no real changes from prior exams.  Patient does have no atrophy in the musculature of either lower extremity.  Specialty Comments:  No specialty comments available.  Imaging: US Guided Needle Placement - No Linked Charges  Result Date: 01/29/2021 Longitudinal ultrasound images of the right ring finger and left small finger are reviewed.  Demonstrated is needle placement near the tendon sheath with extravasation of fluid around the tendon and below the A1 pulley.    PMFS History: Patient Active Problem List   Diagnosis Date Noted  . Chronic venous insufficiency 02/29/2020  . Pain  in joint, ankle and foot 02/29/2020  . Spondylosis without myelopathy or radiculopathy, lumbar region 01/05/2019  . Foraminal stenosis of lumbar region 01/05/2019  . Arthritis 06/08/2018  . GERD (gastroesophageal reflux disease) 06/08/2018  . Macular degeneration 06/08/2018  . Sleep apnea 06/08/2018  . Trouble swallowing 06/08/2018   Past Medical History:  Diagnosis Date  . Macular degeneration   . Sleep apnea     Family History  Problem Relation Age of Onset  . Heart disease Mother   . Cerebral aneurysm Father     History reviewed. No pertinent surgical history. Social History   Occupational History  . Not on file  Tobacco Use  . Smoking status: Current Every Day Smoker  . Smokeless tobacco: Never Used  Substance and Sexual Activity  . Alcohol use: Never  . Drug use: Never  . Sexual activity: Not on file

## 2021-02-03 ENCOUNTER — Telehealth: Payer: Self-pay

## 2021-02-03 NOTE — Telephone Encounter (Signed)
Please advise 

## 2021-02-03 NOTE — Telephone Encounter (Signed)
Patient called regarding a referral for her back she is requesting a referral to be sent for her right arm from her shoulder to her elbow patient would like a call back, per patient lvm if she doesn't answer call [phone]

## 2021-02-18 ENCOUNTER — Telehealth: Payer: Self-pay | Admitting: Orthopedic Surgery

## 2021-02-18 NOTE — Telephone Encounter (Signed)
Patient asking for contrast so nothing is missed I advised her that was not necessary, that they will see what they need without contrast

## 2021-02-18 NOTE — Telephone Encounter (Signed)
Patient called requesting a call back from [employer]. Patient states she has questions about her MRI. Please call patient at [phone].

## 2021-02-23 ENCOUNTER — Other Ambulatory Visit: Payer: Self-pay

## 2021-02-23 ENCOUNTER — Ambulatory Visit
Admission: RE | Admit: 2021-02-23 | Discharge: 2021-02-23 | Disposition: A | Discharge status: Home / Self Care | Payer: Medicare Other | Source: Ambulatory Visit | Attending: Orthopedic Surgery | Admitting: Orthopedic Surgery

## 2021-02-23 DIAGNOSIS — M48061 Spinal stenosis, lumbar region without neurogenic claudication: Secondary | ICD-10-CM

## 2021-02-24 NOTE — Progress Notes (Signed)
Scan reviewed.  Is she coming back anytime soon.

## 2021-03-06 ENCOUNTER — Ambulatory Visit (INDEPENDENT_AMBULATORY_CARE_PROVIDER_SITE_OTHER): Payer: Medicare Other | Admitting: Orthopedic Surgery

## 2021-03-06 ENCOUNTER — Other Ambulatory Visit: Payer: Self-pay

## 2021-03-06 DIAGNOSIS — M48061 Spinal stenosis, lumbar region without neurogenic claudication: Secondary | ICD-10-CM

## 2021-03-06 DIAGNOSIS — R29898 Other symptoms and signs involving the musculoskeletal system: Secondary | ICD-10-CM | POA: Diagnosis not present

## 2021-03-07 DIAGNOSIS — M25511 Pain in right shoulder: Secondary | ICD-10-CM | POA: Diagnosis not present

## 2021-03-07 NOTE — Progress Notes (Signed)
Office Visit Note   Patient: Alicia [name]           Date of Birth: [birth year]           MRN: [id number] Visit Date: 03/06/2021 Requested by: No referring provider defined for this encounter. PCP: Pcp, No  Subjective: Chief Complaint  Patient presents with  . Lower Back - Follow-up    HPI: Alicia [name] is a 75 y.o. female who presents to the office complaining of low back pain and bilateral leg radicular pain.  She returns for review of MRI lumbar spine.  She endorses continued lumbar spine pain with radiation into the right inner thigh and groin as well as radiation down the entirety of the left leg.  She has had multiple lumbar spine epidural steroid injections that have only provided relief for 1 to 2 days at most.  She had MRI of the lumbar spine that revealed bone marrow edema involving the left L5 pedicle suggestive of stress fracture/pars defect, moderate to severe left foraminal stenosis at L4-L5 with moderate foraminal stenosis bilaterally at L5-S1.  Also noted is moderate foraminal stenosis on the right at L2-L3 with mild to moderate foraminal stenosis at L3-L4.  She reports that she has recently had a low hemoglobin of 10.2 with low iron count as well which she has been placed on iron supplement twice a day by her PCP.  She does have a cardiologist appointment on Monday for routine follow-up of what will likely be a carotid ultrasound.  She had prior evaluation of her carotids 10 years ago that she reports was unremarkable.  She is also seen GI for difficulty swallowing.  Denies any blood thinners..  Taking aspirin 81 mg.  She also reports continued difficulty with her right arm.  She has had previous cervical spine ESI that provided relief of the radicular pain down to the distal extremity of her right arm.  However she continues to complain of right shoulder pain.  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  She is able to lift the arm up above  her head but notes specific rotational weakness.  She denies any history of recent injury.  She is not able to sleep on her right side..                ROS: All systems reviewed are negative as they relate to the chief complaint within the history of present illness.  Patient denies fevers or chills.  Assessment & Plan: Visit Diagnoses:  1. Neural foraminal stenosis of lumbar spine   2. Weakness of shoulder     Plan: Patient is a 75 year old female who presents complaining of low back pain with radicular pain as well as right shoulder pain.  Reviewed the MRI scan with patient today with results as detailed above in the HPI.  She has had minimal improvement with ESI and this is the most distressing problem she is dealing with currently.  Discussed options available to patient and after lengthy discussion, she wishes to proceed with referral to a surgeon to discuss surgical management.  Plan to refer patient to Dr. Saintclair Halsted for further discussion.   Regarding the right shoulder, she clearly has pronounced infraspinatus weakness.  Prior right shoulder radiographs from September 2021 are negative for any acute or chronic changes with no arthropathy noted.  Discussed options available to patient.  She reports that the right shoulder is not bothering her enough to consider surgery  and she does not want to obtain MRI scan at this time.  She wants to try an injection to see if this will improve the pain that she is dealing with and allow her to sleep on that side.  Subacromial injection was administered and patient tolerated the procedure well.  If she has continued difficulty despite the injection, she will need to make the determination between either living with this pain and dysfunction or obtaining an MRI scan with potential plan for surgery based on the results.  Patient agreed with plan.  Follow-Up Instructions: No follow-ups on file.   Orders:  No orders of the defined types were placed in this  encounter.  No orders of the defined types were placed in this encounter.     Procedures: Large Joint Inj: R subacromial bursa on 03/07/2021 11:00 AM Indications: diagnostic evaluation and pain Details: 18 G 1.5 in needle, posterior approach  Arthrogram: No  Medications: 9 mL bupivacaine 0.5 %; 5 mL lidocaine 1 %; 6 mg betamethasone acetate-betamethasone sodium phosphate 6 (3-3) MG/ML Outcome: tolerated well, no immediate complications Procedure, treatment alternatives, risks and benefits explained, specific risks discussed. Consent was given by the patient. Immediately prior to procedure a time out was called to verify the correct patient, procedure, equipment, support staff and site/side marked as required. Patient was prepped and draped in the usual sterile fashion.       Clinical Data: No additional findings.  Objective: Vital Signs: There were no vitals taken for this visit.  Physical Exam:  Constitutional: Patient appears well-developed HEENT:  Head: Normocephalic Eyes:EOM are normal Neck: Normal range of motion Cardiovascular: Normal rate Pulmonary/chest: Effort normal Neurologic: Patient is alert Skin: Skin is warm Psychiatric: Patient has normal mood and affect  Ortho Exam: Ortho exam demonstrates tenderness throughout the axial lumbar spine.  No clonus noted on exam.  Sensation intact through all dermatomes of the bilateral lower extremities but diminished in the left foot, medial/lateral left calf.  Strength intact through all muscle groups of the lower extremities but diminished to 5 -/5 for bilateral hip flexor and hamstring strength.  Examination of the right shoulder reveals 50 degrees external rotation, 95 degrees abduction, 180 degrees forward flexion.  She has 5/5 motor strength of supraspinatus and subscapularis bilaterally but asymmetric weakness of the right infraspinatus that is rated 4/5.  She has a positive Hornblower sign on the right that is not  present on the left.  She is able to lift her arm completely above her head but she has difficulty rotating her right arm externally in order to put her hand to her head.  Specialty Comments:  No specialty comments available.  Imaging: No results found.   PMFS History: Patient Active Problem List   Diagnosis Date Noted  . Chronic venous insufficiency 02/29/2020  . Pain in joint, ankle and foot 02/29/2020  . Spondylosis without myelopathy or radiculopathy, lumbar region 01/05/2019  . Foraminal stenosis of lumbar region 01/05/2019  . Arthritis 06/08/2018  . GERD (gastroesophageal reflux disease) 06/08/2018  . Macular degeneration 06/08/2018  . Sleep apnea 06/08/2018  . Trouble swallowing 06/08/2018   Past Medical History:  Diagnosis Date  . Macular degeneration   . Sleep apnea     Family History  Problem Relation Age of Onset  . Heart disease Mother   . Cerebral aneurysm Father     No past surgical history on file. Social History   Occupational History  . Not on file  Tobacco Use  .  Smoking status: Current Every Day Smoker  . Smokeless tobacco: Never Used  Substance and Sexual Activity  . Alcohol use: Never  . Drug use: Never  . Sexual activity: Not on file

## 2021-03-08 ENCOUNTER — Encounter: Payer: Self-pay | Admitting: Orthopedic Surgery

## 2021-03-08 MED ORDER — LIDOCAINE HCL 1 % IJ SOLN
5.0000 mL | INTRAMUSCULAR | Status: AC | PRN
  Administered 2021-03-07: 5 mL

## 2021-03-08 MED ORDER — BETAMETHASONE SOD PHOS & ACET 6 (3-3) MG/ML IJ SUSP
6.0000 mg | INTRAMUSCULAR | Status: AC | PRN
  Administered 2021-03-07: 6 mg via INTRA_ARTICULAR

## 2021-03-08 MED ORDER — BUPIVACAINE HCL 0.5 % IJ SOLN
9.0000 mL | INTRAMUSCULAR | Status: AC | PRN
Start: 2021-03-07 — End: 2021-03-07
  Administered 2021-03-07: 9 mL via INTRA_ARTICULAR

## 2021-03-12 ENCOUNTER — Other Ambulatory Visit: Payer: Self-pay

## 2021-03-12 DIAGNOSIS — M48061 Spinal stenosis, lumbar region without neurogenic claudication: Secondary | ICD-10-CM

## 2021-03-17 ENCOUNTER — Telehealth: Payer: Self-pay | Admitting: Orthopedic Surgery

## 2021-03-17 NOTE — Telephone Encounter (Signed)
error 

## 2021-03-21 ENCOUNTER — Other Ambulatory Visit: Payer: Self-pay | Admitting: Neurosurgery

## 2021-03-21 DIAGNOSIS — M5440 Lumbago with sciatica, unspecified side: Secondary | ICD-10-CM

## 2021-04-03 ENCOUNTER — Other Ambulatory Visit: Payer: Self-pay | Admitting: *Deleted

## 2021-04-03 DIAGNOSIS — R0989 Other specified symptoms and signs involving the circulatory and respiratory systems: Secondary | ICD-10-CM

## 2021-04-08 ENCOUNTER — Other Ambulatory Visit: Payer: Self-pay

## 2021-04-08 ENCOUNTER — Ambulatory Visit
Admission: RE | Admit: 2021-04-08 | Discharge: 2021-04-08 | Disposition: A | Discharge status: Home / Self Care | Payer: Medicare Other | Source: Ambulatory Visit | Attending: Neurosurgery | Admitting: Neurosurgery

## 2021-04-08 DIAGNOSIS — M5440 Lumbago with sciatica, unspecified side: Secondary | ICD-10-CM

## 2021-04-25 ENCOUNTER — Other Ambulatory Visit: Payer: Self-pay

## 2021-04-25 ENCOUNTER — Ambulatory Visit (HOSPITAL_COMMUNITY)
Admission: RE | Admit: 2021-04-25 | Discharge: 2021-04-25 | Disposition: A | Discharge status: Home / Self Care | Payer: Medicare Other | Source: Ambulatory Visit | Attending: Vascular Surgery | Admitting: Vascular Surgery

## 2021-04-25 ENCOUNTER — Ambulatory Visit (INDEPENDENT_AMBULATORY_CARE_PROVIDER_SITE_OTHER): Payer: Medicare Other | Admitting: Vascular Surgery

## 2021-04-25 ENCOUNTER — Encounter: Payer: Self-pay | Admitting: Vascular Surgery

## 2021-04-25 VITALS — BP 153/76 | HR 85 | Temp 98.2°F | Resp 20 | Ht 61.5 in | Wt 234.0 lb

## 2021-04-25 DIAGNOSIS — R0989 Other specified symptoms and signs involving the circulatory and respiratory systems: Secondary | ICD-10-CM | POA: Diagnosis present

## 2021-04-25 DIAGNOSIS — I6523 Occlusion and stenosis of bilateral carotid arteries: Secondary | ICD-10-CM

## 2021-04-25 DIAGNOSIS — I872 Venous insufficiency (chronic) (peripheral): Secondary | ICD-10-CM

## 2021-04-25 NOTE — Progress Notes (Deleted)
   Patient ID: Alicia [name], female   DOB: 1946/01/26, 75 y.o.   MRN: [id number]  Reason for Consult: Follow-up   Referred by Fredrich Romans, PA  Subjective:     HPI:  Alicia [name] is a 75 y.o. female ***  Past Medical History:  Diagnosis Date   Carotid artery occlusion    Macular degeneration    Sleep apnea    Family History  Problem Relation Age of Onset   Heart disease Mother    Cerebral aneurysm Father    History reviewed. No pertinent surgical history.  Short Social History:  Social History   Tobacco Use   Smoking status: Every Day    Pack years: 0.00   Smokeless tobacco: Never  Substance Use Topics   Alcohol use: Never    Allergies  Allergen Reactions   Penicillins Other (See Comments)    Yeast infection     Current Outpatient Medications  Medication Sig Dispense Refill   aspirin EC 81 MG tablet Take 81 mg by mouth.     atorvastatin (LIPITOR) 40 MG tablet Take 40 mg by mouth at bedtime.     bacitracin ophthalmic ointment Administer to both eyes daily at 0600.     Cholecalciferol (VITAMIN D) 2000 units tablet Take by mouth.     Clobetasol Propionate 0.05 % shampoo Apply topically.     desonide (DESOWEN) 0.05 % lotion Apply topically.     doxycycline (VIBRA-TABS) 100 MG tablet Take 1 tablet (100 mg total) by mouth 2 (two) times daily. 30 tablet 0   DULoxetine (CYMBALTA) 60 MG capsule TAKE 1 CAPSULE BY MOUTH EVERY DAY 90 capsule 7   Fluocinolone Acetonide 0.01 % OIL Place 0.01 % in ear(s).     gabapentin (NEURONTIN) 300 MG capsule TAKE 1 CAPSULE BY MOUTH THREE TIMES A DAY 270 capsule 3   Lutein 20 MG TABS Take 20 mg by mouth.     magnesium oxide (MAG-OX) 400 MG tablet Take 400 mg by mouth.     meloxicam (MOBIC) 7.5 MG tablet Take 7.5 mg 2 (two) times daily by mouth.  0   omeprazole (PRILOSEC) 20 MG capsule TAKE 1 CAPSULE BY MOUTH EVERY DAY.NEED OFFICE VISIT FOR MORE REFILLS  1   oxybutynin (DITROPAN-XL) 10 MG 24 hr tablet Take 10 mg daily by  mouth.  1   pentoxifylline (TRENTAL) 400 MG CR tablet TAKE 1 TABLET (400 MG TOTAL) BY MOUTH 3 (THREE) TIMES DAILY WITH MEALS. 270 tablet 1   pramipexole (MIRAPEX) 0.5 MG tablet Take 0.5 mg 2 (two) times daily by mouth.  1   tobramycin (TOBREX) 0.3 % ophthalmic solution      No current facility-administered medications for this visit.    REVIEW OF SYSTEMS      Objective:  Objective   Vitals:   04/25/21 1531 04/25/21 1536  BP: 113/64 (!) 153/76  Pulse: 85   Resp: 20   Temp: 98.2 F (36.8 C)   SpO2: 92%   Weight: 234 lb (106.1 kg)   Height: 5' 1.5" (1.562 m)    Body mass index is 43.5 kg/m.  Physical Exam  Data:      Assessment/Plan:         Waynetta Sandy MD [facility]

## 2021-04-25 NOTE — Progress Notes (Signed)
Patient ID: Alicia [name], female   DOB: [birth year], 75 y.o.   MRN: [id number]  Reason for Consult: Follow-up   Referred by Fredrich Romans, PA  Subjective:     HPI:  Alicia [name] is a 75 y.o. female previously evaluated with left lower extremity venous reflux studies.  She now has had neck pain was found to have carotid artery stenosis.  She denies any history of stroke, TIA or amaurosis.  Her actual greatest complaint is numbness and pain of her left lower extremity related to spinal stenosis.  She does not have any tissue loss or ulceration.  She does not have any personal or family history of aneurysm disease.  She does have swelling of her left lower extremity as well as a spider veins.  Past Medical History:  Diagnosis Date   Carotid artery occlusion    Macular degeneration    Sleep apnea    Family History  Problem Relation Age of Onset   Heart disease Mother    Cerebral aneurysm Father    History reviewed. No pertinent surgical history.  Short Social History:  Social History   Tobacco Use   Smoking status: Every Day    Pack years: 0.00   Smokeless tobacco: Never  Substance Use Topics   Alcohol use: Never    Allergies  Allergen Reactions   Penicillins Other (See Comments)    Yeast infection     Current Outpatient Medications  Medication Sig Dispense Refill   aspirin EC 81 MG tablet Take 81 mg by mouth.     atorvastatin (LIPITOR) 40 MG tablet Take 40 mg by mouth at bedtime.     bacitracin ophthalmic ointment Administer to both eyes daily at 0600.     Cholecalciferol (VITAMIN D) 2000 units tablet Take by mouth.     Clobetasol Propionate 0.05 % shampoo Apply topically.     desonide (DESOWEN) 0.05 % lotion Apply topically.     doxycycline (VIBRA-TABS) 100 MG tablet Take 1 tablet (100 mg total) by mouth 2 (two) times daily. 30 tablet 0   DULoxetine (CYMBALTA) 60 MG capsule TAKE 1 CAPSULE BY MOUTH EVERY DAY 90 capsule 7   Fluocinolone Acetonide 0.01  % OIL Place 0.01 % in ear(s).     gabapentin (NEURONTIN) 300 MG capsule TAKE 1 CAPSULE BY MOUTH THREE TIMES A DAY 270 capsule 3   Lutein 20 MG TABS Take 20 mg by mouth.     magnesium oxide (MAG-OX) 400 MG tablet Take 400 mg by mouth.     meloxicam (MOBIC) 7.5 MG tablet Take 7.5 mg 2 (two) times daily by mouth.  0   omeprazole (PRILOSEC) 20 MG capsule TAKE 1 CAPSULE BY MOUTH EVERY DAY.NEED OFFICE VISIT FOR MORE REFILLS  1   oxybutynin (DITROPAN-XL) 10 MG 24 hr tablet Take 10 mg daily by mouth.  1   pentoxifylline (TRENTAL) 400 MG CR tablet TAKE 1 TABLET (400 MG TOTAL) BY MOUTH 3 (THREE) TIMES DAILY WITH MEALS. 270 tablet 1   pramipexole (MIRAPEX) 0.5 MG tablet Take 0.5 mg 2 (two) times daily by mouth.  1   tobramycin (TOBREX) 0.3 % ophthalmic solution      No current facility-administered medications for this visit.    Review of Systems  Constitutional:  Constitutional negative. HENT:       Neck pain Eyes: Eyes negative.  Respiratory: Respiratory negative.  Cardiovascular: Positive for leg swelling.  GI: Gastrointestinal negative.  Musculoskeletal: Positive for leg pain.  Skin: Skin  negative.  Neurological: Neurological negative. Hematologic: Hematologic/lymphatic negative.       Objective:  Objective   Vitals:   04/25/21 1531 04/25/21 1536  BP: 113/64 (!) 153/76  Pulse: 85   Resp: 20   Temp: 98.2 F (36.8 C)   SpO2: 92%   Weight: 234 lb (106.1 kg)   Height: 5' 1.5" (1.562 m)    Body mass index is 43.5 kg/m.  Physical Exam HENT:     Head: Normocephalic.     Nose:     Comments: Wearing a mask Eyes:     Pupils: Pupils are equal, round, and reactive to light.  Neck:     Vascular: No carotid bruit.  Cardiovascular:     Rate and Rhythm: Normal rate.     Pulses:          Radial pulses are 2+ on the right side and 2+ on the left side.       Popliteal pulses are 2+ on the right side and 2+ on the left side.  Pulmonary:     Effort: Pulmonary effort is normal.   Abdominal:     General: Abdomen is flat.     Palpations: Abdomen is soft.  Musculoskeletal:        General: Normal range of motion.     Cervical back: Normal range of motion.     Right lower leg: No edema.     Left lower leg: No edema.  Skin:    Comments: Spider veins left foot/ankle  Neurological:     General: No focal deficit present.     Mental Status: She is alert.  Psychiatric:        Mood and Affect: Mood normal.        Behavior: Behavior normal.        Thought Content: Thought content normal.    Data: Right Carotid Findings:  +----------+--------+--------+--------+-------------------------+--------+            PSV cm/sEDV cm/sStenosisPlaque Description       Comments  +----------+--------+--------+--------+-------------------------+--------+  CCA Prox  114     19                                                 +----------+--------+--------+--------+-------------------------+--------+  CCA Mid   90      17                                                 +----------+--------+--------+--------+-------------------------+--------+  CCA Distal65      18              heterogenous                       +----------+--------+--------+--------+-------------------------+--------+  ICA Prox  67      22      1-39%   heterogenous and calcific          +----------+--------+--------+--------+-------------------------+--------+  ICA Mid   65      18                                       tortuous  +----------+--------+--------+--------+-------------------------+--------+  ICA Distal120     35                                                 +----------+--------+--------+--------+-------------------------+--------+  ECA       82      13                                                 +----------+--------+--------+--------+-------------------------+--------+   +----------+--------+-------+----------------+-------------------+             PSV cm/sEDV cmsDescribe        Arm Pressure (mmHG)  +----------+--------+-------+----------------+-------------------+  [id number]             Multiphasic, WNL                     +----------+--------+-------+----------------+-------------------+   +---------+--------+--+--------+-+---------+  VertebralPSV cm/s25EDV cm/s6Antegrade  +---------+--------+--+--------+-+---------+       Left Carotid Findings:  +----------+--------+--------+--------+------------------+--------+            PSV cm/sEDV cm/sStenosisPlaque DescriptionComments  +----------+--------+--------+--------+------------------+--------+  CCA Prox  96      17                                          +----------+--------+--------+--------+------------------+--------+  CCA Mid   72      15                                          +----------+--------+--------+--------+------------------+--------+  CCA Distal65      13                                          +----------+--------+--------+--------+------------------+--------+  ICA Prox  117     36      1-39%   calcific                    +----------+--------+--------+--------+------------------+--------+  ICA Mid   184     44                                tortuous  +----------+--------+--------+--------+------------------+--------+  ICA Distal86      26                                          +----------+--------+--------+--------+------------------+--------+  ECA       111     12              heterogenous                +----------+--------+--------+--------+------------------+--------+   +----------+--------+--------+----------------+-------------------+            PSV cm/sEDV cm/sDescribe        Arm Pressure (mmHG)  +----------+--------+--------+----------------+-------------------+  [id number]  Multiphasic, WNL                      +----------+--------+--------+----------------+-------------------+   +---------+--------+--+--------+-+---------+  VertebralPSV cm/s28EDV cm/s9Antegrade  +---------+--------+--+--------+-+---------+    Summary:  Right Carotid: Velocities in the right ICA are consistent with a 1-39%  stenosis.   Left Carotid: Velocities in the left ICA are consistent with a 1-39%  stenosis.   Vertebrals:  Bilateral vertebral arteries demonstrate antegrade flow.  Subclavians: Normal flow hemodynamics were seen in bilateral subclavian               arteries.      Assessment/Plan:     75 year old female sent for evaluation of carotid artery stenosis.  Bilateral carotid arteries 1 to 39% stenosed without bruit today.  She does have known reflux of her left lower extremity with spider veins but greatest complaint appears to be related to spinal stenosis.  She is okay for vascular standpoint to have any procedures necessary.  She will follow-up with [place] from that standpoint on a as needed basis we will see her in 5 years for her carotid stenosis     Waynetta Sandy MD [facility]

## 2021-06-24 ENCOUNTER — Other Ambulatory Visit: Payer: Self-pay | Admitting: Neurosurgery

## 2021-07-02 ENCOUNTER — Ambulatory Visit (INDEPENDENT_AMBULATORY_CARE_PROVIDER_SITE_OTHER): Payer: Medicare Other | Admitting: Otolaryngology

## 2021-07-02 ENCOUNTER — Other Ambulatory Visit: Payer: Self-pay

## 2021-07-02 DIAGNOSIS — I6523 Occlusion and stenosis of bilateral carotid arteries: Secondary | ICD-10-CM

## 2021-07-02 DIAGNOSIS — H903 Sensorineural hearing loss, bilateral: Secondary | ICD-10-CM | POA: Diagnosis not present

## 2021-07-02 DIAGNOSIS — H6123 Impacted cerumen, bilateral: Secondary | ICD-10-CM | POA: Diagnosis not present

## 2021-07-02 NOTE — Progress Notes (Signed)
HPI: Alicia [name] is a 75 y.o. female who presents for evaluation of left ear discomfort that occurred about 2 months ago.  At that time she felt like something was "crawling" in her ear.  She had some discomfort in the ear.  It is doing better presently. 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  She also wanted this checked. She does smoke cigarettes on a regular basis about a pack a day.  Past Medical History:  Diagnosis Date   Carotid artery occlusion    Macular degeneration    Sleep apnea    No past surgical history on file. Social History   Socioeconomic History   Marital status: Widowed    Spouse name: Not on file   Number of children: Not on file   Years of education: Not on file   Highest education level: Not on file  Occupational History   Not on file  Tobacco Use   Smoking status: Every Day   Smokeless tobacco: Never  Vaping Use   Vaping Use: Never used  Substance and Sexual Activity   Alcohol use: Never   Drug use: Never   Sexual activity: Not on file  Other Topics Concern   Not on file  Social History Narrative   Not on file   Social Determinants of Health   Financial Resource Strain: Not on file  Food Insecurity: Not on file  Transportation Needs: Not on file  Physical Activity: Not on file  Stress: Not on file  Social Connections: Not on file   Family History  Problem Relation Age of Onset   Heart disease Mother    Cerebral aneurysm Father    Allergies  Allergen Reactions   Penicillins Other (See Comments)    Yeast infection    Prior to Admission medications   Medication Sig Start Date End Date Taking? Authorizing Provider  aspirin EC 81 MG tablet Take 81 mg by mouth.    [provider]  atorvastatin (LIPITOR) 40 MG tablet Take 40 mg by mouth at bedtime. 08/29/19   [provider]  bacitracin ophthalmic ointment Administer to both eyes daily at 0600.    [provider]  Cholecalciferol (VITAMIN D) 2000 units tablet Take by mouth.    [provider]  Clobetasol Propionate 0.05 % shampoo Apply topically.    [provider]  desonide (DESOWEN) 0.05 % lotion Apply topically.    [provider]  doxycycline (VIBRA-TABS) 100 MG tablet Take 1 tablet (100 mg total) by mouth 2 (two) times daily. 09/25/19   Newt Minion, MD  DULoxetine (CYMBALTA) 60 MG capsule TAKE 1 CAPSULE BY MOUTH EVERY DAY 11/26/20   Magnus Sinning, MD  Fluocinolone Acetonide 0.01 % OIL Place 0.01 % in ear(s).    [provider]  gabapentin (NEURONTIN) 300 MG capsule TAKE 1 CAPSULE BY MOUTH THREE TIMES A DAY 08/22/20   Magnus Sinning, MD  Lutein 20 MG TABS Take 20 mg by mouth.    [provider]  magnesium oxide (MAG-OX) 400 MG tablet Take 400 mg by mouth.    [provider]  meloxicam (MOBIC) 7.5 MG tablet Take 7.5 mg 2 (two) times daily by mouth. 08/13/17   [provider]  omeprazole (PRILOSEC) 20 MG capsule TAKE 1 CAPSULE BY MOUTH EVERY DAY.NEED OFFICE VISIT FOR MORE REFILLS 08/20/17   [provider]  oxybutynin (DITROPAN-XL) 10 MG 24 hr tablet Take  10 mg daily by mouth. 06/17/17   [provider]  pentoxifylline (TRENTAL) 400 MG CR tablet TAKE 1 TABLET (400 MG TOTAL) BY MOUTH 3 (THREE) TIMES DAILY WITH MEALS. 12/18/19   Newt Minion, MD  pramipexole (MIRAPEX) 0.5 MG tablet Take 0.5 mg 2 (two) times daily by mouth. 09/04/17   [provider]  tobramycin (TOBREX) 0.3 % ophthalmic solution  10/06/16   [provider]     Positive ROS: Otherwise normal  All other systems have been reviewed and were otherwise negative with the exception of those mentioned in the HPI and as above.  Physical Exam: Constitutional: Alert, well-appearing, no acute distress Ears: External ears without lesions or tenderness.  She had a small amount of wax buildup in both ear canals that was cleaned with  curettes.  TMs were clear bilaterally.  Ear canals were otherwise clear bilaterally. Nasal: External nose without lesions. Septum with mild deformity and mild rhinitis.. Clear nasal passages otherwise. Oral: Lips and gums without lesions. Tongue and palate mucosa without lesions. Posterior oropharynx clear.  Tonsil regions appear benign bilaterally. Neck: No palpable adenopathy or masses.  Well-healed left parotid incision.  No palpable masses within the parotid bed and no adenopathy in the left neck. Respiratory: Breathing comfortably  Skin: No facial/neck lesions or rash noted.  Audiologic testing today demonstrated symmetric hearing in both ears with a mild hearing loss in both ears with SRT's of 25 DB on the right and 30 DB on the left.  Cerumen impaction removal  Date/Time: 07/02/2021 12:37 PM Performed by: Rozetta Nunnery, MD Authorized by: Rozetta Nunnery, MD   Consent:    Consent obtained:  Verbal   Consent given by:  Patient   Risks discussed:  Pain and bleeding Procedure details:    Location:  L ear and R ear   Procedure type: curette   Post-procedure details:    Inspection:  TM intact and canal normal   Hearing quality:  Improved   Procedure completion:  Tolerated well, no immediate complications Comments:     TMs are clear bilaterally.  Assessment: Ear canals are clear bilaterally except for small amount of wax that was cleaned in the office. Reviewed with her concerning the audiologic testing which did demonstrate a mild bilateral symmetric sensorineural hearing loss.  Plan: Reviewed with her that if she would be a candidate for hearing aids if she is having trouble with her hearing. Reassured her of normal ear examination and no evidence of recurrent tumor in the left parotid area. She will follow-up as needed.  Radene Journey, MD

## 2021-07-08 ENCOUNTER — Encounter (INDEPENDENT_AMBULATORY_CARE_PROVIDER_SITE_OTHER): Payer: Self-pay

## 2021-07-09 NOTE — Progress Notes (Signed)
Surgical Instructions    Your procedure is scheduled on 07/21/21.  Report to [facility] Main Entrance "A" at 0530 A.M., then check in with the Admitting office.  Call this number if you have problems the morning of surgery:  [phone]   If you have any questions prior to your surgery date call [phone]: Open Monday-Friday 8am-4pm    Remember:  Do not eat after midnight the night before your surgery  You may drink clear liquids until 0430am the morning of your surgery.   Clear liquids allowed are: Water, Non-Citrus Juices (without pulp), Carbonated Beverages, Clear Tea, Black Coffee ONLY (NO MILK, CREAM OR POWDERED CREAMER of any kind), and Gatorade    Take these medicines the morning of surgery with A SIP OF WATER  acetaminophen (TYLENOL) if needed albuterol (VENTOLIN HFA) if needed    bring inhaler with you the day of surgery carboxymethylcellul-glycerin (OPTIVE) if needed gabapentin (NEURONTIN)  omeprazole (PRILOSEC) pramipexole (MIRAPEX)   As of today, STOP taking any Aspirin (unless otherwise instructed by your surgeon) Aleve, Naproxen, Ibuprofen, Motrin, Advil, Goody's, BC's, all herbal medications, fish oil, and all vitamins.          Do not wear jewelry or makeup Do not wear lotions, powders, perfumes/colognes, or deodorant. Do not shave 48 hours prior to surgery.   Do not bring valuables to the hospital.  DO Not wear nail polish, gel polish, artificial nails, or any other type of covering on natural nails  including finger and toenails. If patients have artificial nails, gel coating, etc. that need to be removed by a nail salon please have this removed prior to surgery or surgery may need to be canceled/delayed if the surgeon/ anesthesia feels like the patient is unable to be adequately monitored.             [facility] is not responsible for any belongings or valuables.  Do NOT Smoke (Tobacco/Vaping) or drink Alcohol 24 hours prior to your procedure If you use  a CPAP at night, you may bring all equipment for your overnight stay.   Contacts, glasses, dentures or bridgework may not be worn into surgery, please bring cases for these belongings   For patients admitted to the hospital, discharge time will be determined by your treatment team.   Patients discharged the day of surgery will not be allowed to drive home, and someone needs to stay with them for 24 hours.  ONLY 1 SUPPORT PERSON MAY BE PRESENT WHILE YOU ARE IN SURGERY. IF YOU ARE TO BE ADMITTED ONCE YOU ARE IN YOUR ROOM YOU WILL BE ALLOWED TWO (2) VISITORS.  Minor children may have two parents present. Special consideration for safety and communication needs will be reviewed on a case by case basis.  Special instructions:    Oral Hygiene is also important to reduce your risk of infection.  Remember - BRUSH YOUR TEETH THE MORNING OF SURGERY WITH YOUR REGULAR TOOTHPASTE   - Preparing For Surgery  Before surgery, you can play an important role. Because skin is not sterile, your skin needs to be as free of germs as possible. You can reduce the number of germs on your skin by washing with CHG (chlorahexidine gluconate) Soap before surgery.  CHG is an antiseptic cleaner which kills germs and bonds with the skin to continue killing germs even after washing.     Please do not use if you have an allergy to CHG or antibacterial soaps. If your skin becomes reddened/irritated stop  using the CHG.  Do not shave (including legs and underarms) for at least 48 hours prior to first CHG shower. It is OK to shave your face.  Please follow these instructions carefully.     Shower the NIGHT BEFORE SURGERY and the MORNING OF SURGERY with CHG Soap.   If you chose to wash your hair, wash your hair first as usual with your normal shampoo. After you shampoo, rinse your hair and body thoroughly to remove the shampoo.  Then [employer] and genitals (private parts) with your normal soap and rinse thoroughly to  remove soap.  After that Use CHG Soap as you would any other liquid soap. You can apply CHG directly to the skin and wash gently with a scrungie or a clean washcloth.   Apply the CHG Soap to your body ONLY FROM THE NECK DOWN.  Do not use on open wounds or open sores. Avoid contact with your eyes, ears, mouth and genitals (private parts). Wash Face and genitals (private parts)  with your normal soap.   Wash thoroughly, paying special attention to the area where your surgery will be performed.  Thoroughly rinse your body with warm water from the neck down.  DO NOT shower/wash with your normal soap after using and rinsing off the CHG Soap.  Pat yourself dry with a CLEAN TOWEL.  Wear CLEAN PAJAMAS to bed the night before surgery  Place CLEAN SHEETS on your bed the night before your surgery  DO NOT SLEEP WITH PETS.   Day of Surgery: Take a shower with CHG soap. Wear Clean/Comfortable clothing the morning of surgery Do not apply any deodorants/lotions.   Remember to brush your teeth WITH YOUR REGULAR TOOTHPASTE.   Please read over the following fact sheets that you were given.

## 2021-07-10 ENCOUNTER — Other Ambulatory Visit: Payer: Self-pay

## 2021-07-10 ENCOUNTER — Encounter (HOSPITAL_COMMUNITY): Payer: Self-pay

## 2021-07-10 ENCOUNTER — Encounter (HOSPITAL_COMMUNITY)
Admission: RE | Admit: 2021-07-10 | Discharge: 2021-07-10 | Disposition: A | Discharge status: Home / Self Care | Payer: Medicare Other | Source: Ambulatory Visit | Attending: Neurosurgery | Admitting: Neurosurgery

## 2021-07-10 DIAGNOSIS — Z01812 Encounter for preprocedural laboratory examination: Secondary | ICD-10-CM | POA: Insufficient documentation

## 2021-07-10 HISTORY — DX: Personal history of other diseases of the digestive system: Z87.19

## 2021-07-10 LAB — BASIC METABOLIC PANEL
Anion gap: 8 (ref 5–15)
BUN: 9 mg/dL (ref 8–23)
CO2: 25 mmol/L (ref 22–32)
Calcium: 9.7 mg/dL (ref 8.9–10.3)
Chloride: 105 mmol/L (ref 98–111)
Creatinine, Ser: 0.67 mg/dL (ref 0.44–1.00)
GFR, Estimated: >60 mL/min (ref >60)
Glucose, Bld: 95 mg/dL (ref 70–99)
Potassium: 4.4 mmol/L (ref 3.5–5.1)
Sodium: 138 mmol/L (ref 135–145)

## 2021-07-10 LAB — CBC
HCT: 45.5 % (ref 36.0–46.0)
Hemoglobin: 14.8 g/dL (ref 12.0–15.0)
MCH: 29.8 pg (ref 26.0–34.0)
MCHC: 32.5 g/dL (ref 30.0–36.0)
MCV: 91.5 fL (ref 80.0–100.0)
Platelets: 219 10*3/uL (ref 150–400)
RBC: 4.97 MIL/uL (ref 3.87–5.11)
RDW: 13.9 % (ref 11.5–15.5)
WBC: 6.8 10*3/uL (ref 4.0–10.5)
nRBC: 0 % (ref 0.0–0.2)

## 2021-07-10 LAB — SURGICAL PCR SCREEN
MRSA, PCR: NEGATIVE
Staphylococcus aureus: NEGATIVE

## 2021-07-10 NOTE — Progress Notes (Signed)
PCP - Fredrich Romans PAC at [facility] on [place] - Dr. Brigitte Pulse at [facility] was seen for surgery clearance at request of PCP [place], [place]. Normally does not see a cardiologist  Chest x-ray - Not indicated EKG - Not indicated Stress Test - Years ago no follow up needed ECHO - August of 2022 requesting records from [place] - Yes has OSA CPAP - Nightly  DM - Denies  Aspirin Instructions: Will stop her Aspirin on 07/14/21  ERAS Protcol - yes  COVID TEST- 07/17/21 will schedule   Anesthesia review: Yes needed cardiac clearance  Patient denies shortness of breath, fever, cough and chest pain at PAT appointment   All instructions explained to the patient, with a verbal understanding of the material. Patient agrees to go over the instructions while at home for a better understanding. Patient also instructed to wear a mask while in public after being tested for COVID-19. The opportunity to ask questions was provided.

## 2021-07-11 ENCOUNTER — Ambulatory Visit: Payer: Self-pay

## 2021-07-11 ENCOUNTER — Ambulatory Visit (INDEPENDENT_AMBULATORY_CARE_PROVIDER_SITE_OTHER): Payer: Medicare Other | Admitting: Surgical

## 2021-07-11 DIAGNOSIS — M19012 Primary osteoarthritis, left shoulder: Secondary | ICD-10-CM | POA: Diagnosis not present

## 2021-07-11 DIAGNOSIS — R29898 Other symptoms and signs involving the musculoskeletal system: Secondary | ICD-10-CM | POA: Diagnosis not present

## 2021-07-11 DIAGNOSIS — M25511 Pain in right shoulder: Secondary | ICD-10-CM | POA: Diagnosis not present

## 2021-07-12 ENCOUNTER — Encounter: Payer: Self-pay | Admitting: Surgical

## 2021-07-12 MED ORDER — BUPIVACAINE HCL 0.5 % IJ SOLN
9.0000 mL | INTRAMUSCULAR | Status: AC | PRN
  Administered 2021-07-11: 9 mL via INTRA_ARTICULAR

## 2021-07-12 MED ORDER — LIDOCAINE HCL 1 % IJ SOLN
5.0000 mL | INTRAMUSCULAR | Status: AC | PRN
  Administered 2021-07-11: 5 mL

## 2021-07-12 MED ORDER — METHYLPREDNISOLONE ACETATE 40 MG/ML IJ SUSP
40.0000 mg | INTRAMUSCULAR | Status: AC | PRN
  Administered 2021-07-11: 40 mg via INTRA_ARTICULAR

## 2021-07-12 NOTE — Progress Notes (Signed)
Office Visit Note   Patient: Alicia [name]           Date of Birth: [birth year]           MRN: [id number] Visit Date: 07/11/2021 Requested by: No referring provider defined for this encounter. PCP: Pcp, No  Subjective: Chief Complaint  Patient presents with   Left Shoulder - Follow-up   Right Shoulder - Follow-up    HPI: Alicia [name] is a 75 y.o. female who presents to the office complaining of bilateral shoulder pain.  She is here today for bilateral shoulder injections.  She had last right shoulder injection on 03/06/2021 that provided good relief for several months.  She reports the right shoulder bothers her about the same as it did at her last appointment but she now complains of worsening left shoulder pain.  She did have some left shoulder pain at the last appointment but this feels like it is progressively worsening.  Denies any history of diabetes.  Currently the left shoulder bothers her about as much as the right shoulder.  She wakes up with pain at night from both shoulders.  She notes stiffness of the left shoulder.  Right shoulder is not really stiff for her but she does have continued difficulty getting her hand on her head as she has difficulty externally rotating her hand.  She is currently scheduled for surgery with Dr. Saintclair Halsted on 07/21/2021.  She states that he is okay with her having these injections..                ROS: All systems reviewed are negative as they relate to the chief complaint within the history of present illness.  Patient denies fevers or chills.  Assessment & Plan: Visit Diagnoses:  1. Primary osteoarthritis, left shoulder   2. Right arm weakness     Plan: Patient is a 75 year old female who presents complaining of bilateral shoulder pain.  She is requesting injections.  She had subacromial injection in the right shoulder that provided good relief from her symptoms.  She has continued external rotation weakness of the right shoulder but she  does not want to pursue surgery for her right shoulder pain as long as she is having good relief from these injections and especially with her back surgery coming up on 9/12.  No sense in getting MRI scan if she is not wanting to pursue surgery.  Right shoulder subacromial injection was administered and patient tolerated the procedure well.  Additionally, patient complains of worsening left shoulder pain.  She has stiffness of the left shoulder joint that is markedly worse than the range of motion of her right shoulder.  Her last set of radiographs of the left shoulder were reviewed and do reveal moderate glenohumeral arthritis with joint space narrowing.  Ultrasound-guided left shoulder injection was administered today.  Patient tolerated the procedure well and plan for her to follow-up with the office as needed.  If symptoms continue to worsen, recommended she return for repeat injection or discussion of shoulder replacement.  For now she just wants to focus on recovery from her back surgery which is reasonable.  Follow-Up Instructions: No follow-ups on file.   Orders:  Orders Placed This Encounter  Procedures   US Guided Needle Placement - No Linked Charges   No orders of the defined types were placed in this encounter.     Procedures: Large Joint Inj: R subacromial bursa on 07/11/2021 8:55 PM Indications: diagnostic evaluation and pain Details:  18 G 1.5 in needle, posterior approach  Arthrogram: No  Medications: 9 mL bupivacaine 0.5 %; 40 mg methylPREDNISolone acetate 40 MG/ML; 5 mL lidocaine 1 % Outcome: tolerated well, no immediate complications Procedure, treatment alternatives, risks and benefits explained, specific risks discussed. Consent was given by the patient. Immediately prior to procedure a time out was called to verify the correct patient, procedure, equipment, support staff and site/side marked as required. Patient was prepped and draped in the usual sterile fashion.     Large Joint Inj: L glenohumeral on 07/11/2021 8:56 PM Indications: diagnostic evaluation and pain Details: 18 G 3.5 in needle, ultrasound-guided posterior approach  Arthrogram: No  Medications: 9 mL bupivacaine 0.5 %; 40 mg methylPREDNISolone acetate 40 MG/ML; 5 mL lidocaine 1 % Outcome: tolerated well, no immediate complications  Patient was placed in lateral position on examination bed.  Posterior aspect of the shoulder was prepped with alcohol and Betadine.  Ultrasound probe with sterile covering and sterile ultrasound gel was applied to the posterior shoulder and the glenohumeral joint was located.  After initial anesthetic with lidocaine, combination of Marcaine and Depo-Medrol was administered with assistance of spinal needle deep to the infraspinatus into the glenohumeral joint.  Steady flow was felt and patient tolerated the procedure well. Procedure, treatment alternatives, risks and benefits explained, specific risks discussed. Consent was given by the patient. Immediately prior to procedure a time out was called to verify the correct patient, procedure, equipment, support staff and site/side marked as required. Patient was prepped and draped in the usual sterile fashion.      Clinical Data: No additional findings.  Objective: Vital Signs: There were no vitals taken for this visit.  Physical Exam:  Constitutional: Patient appears well-developed HEENT:  Head: Normocephalic Eyes:EOM are normal Neck: Normal range of motion Cardiovascular: Normal rate Pulmonary/chest: Effort normal Neurologic: Patient is alert Skin: Skin is warm Psychiatric: Patient has normal mood and affect  Ortho Exam: Ortho exam demonstrates right shoulder with 40 degrees external rotation, 90 degrees abduction, 170 degrees forward flexion passively.  She has 30 degrees external rotation, 60 degrees abduction, 100 degrees forward flexion passively of the left shoulder.  Decreased grip strength  bilaterally.  5/5 motor strength of bilateral finger abduction, pronation/supination, bicep, tricep, deltoid.  Excellent rotator cuff strength of both shoulders with the exception of weakness of external rotation in the right shoulder.  She has positive Hornblower sign of the right shoulder.  Negative Hoffmann sign bilaterally.  Specialty Comments:  No specialty comments available.  Imaging: No results found.   PMFS History: Patient Active Problem List   Diagnosis Date Noted   Chronic venous insufficiency 02/29/2020   Pain in joint, ankle and foot 02/29/2020   Spondylosis without myelopathy or radiculopathy, lumbar region 01/05/2019   Foraminal stenosis of lumbar region 01/05/2019   Arthritis 06/08/2018   GERD (gastroesophageal reflux disease) 06/08/2018   Macular degeneration 06/08/2018   Sleep apnea 06/08/2018   Trouble swallowing 06/08/2018   Past Medical History:  Diagnosis Date   Carotid artery occlusion    History of hiatal hernia    Macular degeneration    Sleep apnea     Family History  Problem Relation Age of Onset   Heart disease Mother    Cerebral aneurysm Father     Past Surgical History:  Procedure Laterality Date   CHOLECYSTECTOMY  10/2016   EYE SURGERY Bilateral 2017   cataracts removed   PAROTID GLAND TUMOR EXCISION Left 2003   Social History  Occupational History   Not on file  Tobacco Use   Smoking status: Every Day    Packs/day: 1.00    Years: 60.00    Pack years: 60.00    Types: Cigarettes   Smokeless tobacco: Never  Vaping Use   Vaping Use: Never used  Substance and Sexual Activity   Alcohol use: Not Currently   Drug use: Never   Sexual activity: Not Currently

## 2021-07-17 ENCOUNTER — Other Ambulatory Visit (HOSPITAL_COMMUNITY)
Admission: RE | Admit: 2021-07-17 | Discharge: 2021-07-17 | Disposition: A | Discharge status: Home / Self Care | Payer: Medicare Other | Source: Ambulatory Visit | Attending: Neurosurgery | Admitting: Neurosurgery

## 2021-07-17 DIAGNOSIS — Z01812 Encounter for preprocedural laboratory examination: Secondary | ICD-10-CM | POA: Insufficient documentation

## 2021-07-17 DIAGNOSIS — Z20822 Contact with and (suspected) exposure to covid-19: Secondary | ICD-10-CM | POA: Diagnosis not present

## 2021-07-17 LAB — SARS CORONAVIRUS 2 (TAT 6-24 HRS): SARS Coronavirus 2: NEGATIVE

## 2021-07-18 NOTE — Progress Notes (Signed)
Anesthesia Chart Review:   Case: [id number] Date/Time: 07/21/21 0715   Procedure: Laminectomy and Foraminotomy - L4-L5 - left (Left: Back) - 3C   Anesthesia type: General   Pre-op diagnosis: Stenosis   Location: [facility] OR ROOM 20 / [place] [facility]   Surgeons: Kary Kos, MD       DISCUSSION: Pt is 75 years old with hx of carotid artery disease  VS: BP (!) 154/88   Pulse 81   Temp 36.8 C (Oral)   Resp 18   Ht 5' (1.524 m)   Wt 100.2 kg   SpO2 97%   BMI 43.16 kg/m   PROVIDERS: - Fredrich Romans PA at [facility] cardiologist Marton Redwood, MD at [facility] for surgery clearance. Echo ordered, reassuring results below. Pt cleared for surgery at office visit 06/16/21 (notes on paper chart)    LABS: Labs reviewed: Acceptable for surgery. (all labs ordered are listed, but only abnormal results are displayed)  Labs Reviewed  SURGICAL PCR SCREEN  CBC  BASIC METABOLIC PANEL    EKG: [facility] did not have one they could send. Will be obtained day of surgery    CV: Echo 06/02/2021 [facility]): 1.  LV size normal.  Mild concentric LVH.  Overall LV systolic function normal with a EF 55-60% 2.  LA normal in size and function 3.  RV normal in size and function. 4.  RA normal in size and function 5.  Mild aortic valve sclerosis without stenosis 6.  Mitral valve thickened with nodular degeneration.  No mitral regurgitation. 7.  Tricuspid valve appears structurally normal with normal function 8.  No pericardial effusion 9.  Aortic root, ascending aorta, and aortic arch are normal.   Carotid duplex 04/25/21:  - Right Carotid: Velocities in the right ICA are consistent with a 1-39% stenosis.  - Left Carotid: Velocities in the left ICA are consistent with a 1-39% stenosis.  - Vertebrals:  Bilateral vertebral arteries demonstrate antegrade flow.  - Subclavians: Normal flow hemodynamics were seen in bilateral subclavian arteries.    Past Medical  History:  Diagnosis Date   Carotid artery occlusion    History of hiatal hernia    Macular degeneration    Sleep apnea     Past Surgical History:  Procedure Laterality Date   CHOLECYSTECTOMY  10/2016   EYE SURGERY Bilateral 2017   cataracts removed   PAROTID GLAND TUMOR EXCISION Left 2003    MEDICATIONS:  acetaminophen (TYLENOL) 500 MG tablet   Aflibercept (EYLEA IO)   albuterol (VENTOLIN HFA) 108 (90 Base) MCG/ACT inhaler   aspirin EC 81 MG tablet   carboxymethylcellul-glycerin (OPTIVE) 0.5-0.9 % ophthalmic solution   Cholecalciferol (VITAMIN D-3) 125 MCG (5000 UT) TABS   gabapentin (NEURONTIN) 300 MG capsule   Lutein 20 MG TABS   omeprazole (PRILOSEC) 20 MG capsule   pramipexole (MIRAPEX) 0.5 MG tablet   venlafaxine XR (EFFEXOR-XR) 37.5 MG 24 hr capsule   No current facility-administered medications for this encounter.    If no changes, I anticipate pt can proceed with surgery as scheduled.   Willeen Cass, PhD, FNP-BC [facility]/Anesthesiology Phone: [phone] 07/18/2021 3:46 PM

## 2021-07-18 NOTE — Anesthesia Preprocedure Evaluation (Addendum)
Anesthesia Evaluation  Patient identified by MRN, date of birth, ID band Patient awake    Reviewed: Allergy & Precautions, NPO status , Patient's Chart, lab work & pertinent test results  Airway Mallampati: II  TM Distance: >3 FB     Dental   Pulmonary sleep apnea , Current Smoker and Patient abstained from smoking.,    breath sounds clear to auscultation       Cardiovascular negative cardio ROS   Rhythm:Regular Rate:Normal     Neuro/Psych    GI/Hepatic Neg liver ROS, hiatal hernia, GERD  ,  Endo/Other  negative endocrine ROS  Renal/GU negative Renal ROS     Musculoskeletal   Abdominal   Peds  Hematology   Anesthesia Other Findings   Reproductive/Obstetrics                            Anesthesia Physical Anesthesia Plan  ASA: 3  Anesthesia Plan: General   Post-op Pain Management:    Induction: Intravenous  PONV Risk Score and Plan: 2 and Ondansetron, Dexamethasone and Midazolam  Airway Management Planned: Oral ETT  Additional Equipment:   Intra-op Plan:   Post-operative Plan: Extubation in OR  Informed Consent: I have reviewed the patients History and Physical, chart, labs and discussed the procedure including the risks, benefits and alternatives for the proposed anesthesia with the patient or authorized representative who has indicated his/her understanding and acceptance.     Dental advisory given  Plan Discussed with: CRNA and Anesthesiologist  Anesthesia Plan Comments: (See APP note by Durel Salts, FNP )       Anesthesia Quick Evaluation

## 2021-07-21 ENCOUNTER — Ambulatory Visit (HOSPITAL_COMMUNITY): Payer: Medicare Other | Admitting: Certified Registered Nurse Anesthetist

## 2021-07-21 ENCOUNTER — Encounter (HOSPITAL_COMMUNITY): Payer: Self-pay | Admitting: Neurosurgery

## 2021-07-21 ENCOUNTER — Ambulatory Visit (HOSPITAL_COMMUNITY): Payer: Medicare Other | Admitting: Physician Assistant

## 2021-07-21 ENCOUNTER — Other Ambulatory Visit: Payer: Self-pay

## 2021-07-21 ENCOUNTER — Ambulatory Visit (HOSPITAL_COMMUNITY): Payer: Medicare Other

## 2021-07-21 ENCOUNTER — Ambulatory Visit (HOSPITAL_COMMUNITY)
Admission: RE | Admit: 2021-07-21 | Discharge: 2021-07-21 | Disposition: A | Discharge status: Home / Self Care | Payer: Medicare Other | Source: Ambulatory Visit | Attending: Neurosurgery | Admitting: Neurosurgery

## 2021-07-21 ENCOUNTER — Ambulatory Visit (HOSPITAL_COMMUNITY)
Admission: RE | Disposition: A | Discharge status: Home / Self Care | Payer: Self-pay | Source: Ambulatory Visit | Attending: Neurosurgery

## 2021-07-21 DIAGNOSIS — M5126 Other intervertebral disc displacement, lumbar region: Secondary | ICD-10-CM | POA: Insufficient documentation

## 2021-07-21 DIAGNOSIS — M5416 Radiculopathy, lumbar region: Secondary | ICD-10-CM | POA: Diagnosis not present

## 2021-07-21 DIAGNOSIS — Z79899 Other long term (current) drug therapy: Secondary | ICD-10-CM | POA: Diagnosis not present

## 2021-07-21 DIAGNOSIS — Z880 Allergy status to penicillin: Secondary | ICD-10-CM | POA: Insufficient documentation

## 2021-07-21 DIAGNOSIS — Z419 Encounter for procedure for purposes other than remedying health state, unspecified: Secondary | ICD-10-CM

## 2021-07-21 DIAGNOSIS — M48061 Spinal stenosis, lumbar region without neurogenic claudication: Secondary | ICD-10-CM | POA: Diagnosis present

## 2021-07-21 DIAGNOSIS — Z7951 Long term (current) use of inhaled steroids: Secondary | ICD-10-CM | POA: Insufficient documentation

## 2021-07-21 DIAGNOSIS — Z8249 Family history of ischemic heart disease and other diseases of the circulatory system: Secondary | ICD-10-CM | POA: Diagnosis not present

## 2021-07-21 HISTORY — PX: LUMBAR LAMINECTOMY/DECOMPRESSION MICRODISCECTOMY: SHX5026

## 2021-07-21 SURGERY — LUMBAR LAMINECTOMY/DECOMPRESSION MICRODISCECTOMY 1 LEVEL
Anesthesia: General | Site: Back | Laterality: Left

## 2021-07-21 MED ORDER — LIDOCAINE 2% (20 MG/ML) 5 ML SYRINGE
INTRAMUSCULAR | Status: AC
  Filled 2021-07-21: qty 5

## 2021-07-21 MED ORDER — LIDOCAINE-EPINEPHRINE 1 %-1:100000 IJ SOLN
INTRAMUSCULAR | Status: AC
  Filled 2021-07-21: qty 1

## 2021-07-21 MED ORDER — PANTOPRAZOLE SODIUM 40 MG PO TBEC: 40.0000 mg | DELAYED_RELEASE_TABLET | Freq: Every day | ORAL | Status: DC

## 2021-07-21 MED ORDER — GABAPENTIN 300 MG PO CAPS
300.0000 mg | ORAL_CAPSULE | Freq: Three times a day (TID) | ORAL | Status: DC
  Administered 2021-07-21: 300 mg via ORAL
  Filled 2021-07-21: qty 1

## 2021-07-21 MED ORDER — BUPIVACAINE HCL (PF) 0.25 % IJ SOLN
INTRAMUSCULAR | Status: AC
  Filled 2021-07-21: qty 30

## 2021-07-21 MED ORDER — THROMBIN (RECOMBINANT) 20000 UNITS EX SOLR
CUTANEOUS | Status: AC
  Filled 2021-07-21: qty 20000

## 2021-07-21 MED ORDER — PRAMIPEXOLE DIHYDROCHLORIDE 0.25 MG PO TABS
0.5000 mg | ORAL_TABLET | Freq: Two times a day (BID) | ORAL | Status: DC
  Administered 2021-07-21: 0.5 mg via ORAL
  Filled 2021-07-21: qty 2

## 2021-07-21 MED ORDER — LACTATED RINGERS IV SOLN: INTRAVENOUS | Status: DC

## 2021-07-21 MED ORDER — CEFAZOLIN SODIUM-DEXTROSE 2-4 GM/100ML-% IV SOLN
INTRAVENOUS | Status: AC
  Filled 2021-07-21: qty 100

## 2021-07-21 MED ORDER — EPHEDRINE SULFATE-NACL 50-0.9 MG/10ML-% IV SOSY
PREFILLED_SYRINGE | INTRAVENOUS | Status: DC | PRN
  Administered 2021-07-21: 5 mg via INTRAVENOUS

## 2021-07-21 MED ORDER — CHLORHEXIDINE GLUCONATE 0.12 % MT SOLN
15.0000 mL | Freq: Once | OROMUCOSAL | Status: AC
  Administered 2021-07-21: 15 mL via OROMUCOSAL
  Filled 2021-07-21: qty 15

## 2021-07-21 MED ORDER — ORAL CARE MOUTH RINSE: 15.0000 mL | Freq: Once | OROMUCOSAL | Status: AC

## 2021-07-21 MED ORDER — PHENOL 1.4 % MT LIQD: 1.0000 | OROMUCOSAL | Status: DC | PRN

## 2021-07-21 MED ORDER — ONDANSETRON HCL 4 MG/2ML IJ SOLN
INTRAMUSCULAR | Status: DC | PRN
  Administered 2021-07-21: 4 mg via INTRAVENOUS

## 2021-07-21 MED ORDER — LIDOCAINE 2% (20 MG/ML) 5 ML SYRINGE
INTRAMUSCULAR | Status: DC | PRN
  Administered 2021-07-21: 100 mg via INTRAVENOUS

## 2021-07-21 MED ORDER — SODIUM CHLORIDE 0.9% FLUSH
3.0000 mL | Freq: Two times a day (BID) | INTRAVENOUS | Status: DC
  Administered 2021-07-21: 3 mL via INTRAVENOUS

## 2021-07-21 MED ORDER — MENTHOL 3 MG MT LOZG: 1.0000 | LOZENGE | OROMUCOSAL | Status: DC | PRN

## 2021-07-21 MED ORDER — ALUM & MAG HYDROXIDE-SIMETH 200-200-20 MG/5ML PO SUSP: 30.0000 mL | Freq: Four times a day (QID) | ORAL | Status: DC | PRN

## 2021-07-21 MED ORDER — FENTANYL CITRATE (PF) 250 MCG/5ML IJ SOLN
INTRAMUSCULAR | Status: AC
  Filled 2021-07-21: qty 5

## 2021-07-21 MED ORDER — PROPOFOL 10 MG/ML IV BOLUS
INTRAVENOUS | Status: AC
  Filled 2021-07-21: qty 40

## 2021-07-21 MED ORDER — LIDOCAINE-EPINEPHRINE 1 %-1:100000 IJ SOLN
INTRAMUSCULAR | Status: DC | PRN
  Administered 2021-07-21: 10 mL

## 2021-07-21 MED ORDER — FENTANYL CITRATE (PF) 250 MCG/5ML IJ SOLN
INTRAMUSCULAR | Status: DC | PRN
  Administered 2021-07-21: 50 ug via INTRAVENOUS
  Administered 2021-07-21: 150 ug via INTRAVENOUS
  Administered 2021-07-21: 50 ug via INTRAVENOUS

## 2021-07-21 MED ORDER — CEFAZOLIN SODIUM-DEXTROSE 2-4 GM/100ML-% IV SOLN
2.0000 g | Freq: Three times a day (TID) | INTRAVENOUS | Status: DC
Start: 2021-07-21 — End: 2021-07-22
  Administered 2021-07-21: 2 g via INTRAVENOUS
  Filled 2021-07-21: qty 100

## 2021-07-21 MED ORDER — HYDROMORPHONE HCL 1 MG/ML IJ SOLN
0.5000 mg | INTRAMUSCULAR | Status: DC | PRN
Start: 2021-07-21 — End: 2021-07-22

## 2021-07-21 MED ORDER — ACETAMINOPHEN 325 MG PO TABS
ORAL_TABLET | ORAL | Status: AC
  Administered 2021-07-21: 650 mg via ORAL
  Filled 2021-07-21: qty 2

## 2021-07-21 MED ORDER — BUPIVACAINE HCL (PF) 0.25 % IJ SOLN
INTRAMUSCULAR | Status: DC | PRN
  Administered 2021-07-21: 10 mL

## 2021-07-21 MED ORDER — FENTANYL CITRATE (PF) 100 MCG/2ML IJ SOLN: 25.0000 ug | INTRAMUSCULAR | Status: DC | PRN

## 2021-07-21 MED ORDER — POLYVINYL ALCOHOL 1.4 % OP SOLN
1.0000 [drp] | Freq: Three times a day (TID) | OPHTHALMIC | Status: DC | PRN
  Filled 2021-07-21: qty 15

## 2021-07-21 MED ORDER — THROMBIN 20000 UNITS EX SOLR
CUTANEOUS | Status: DC | PRN
  Administered 2021-07-21: 20 mL via TOPICAL

## 2021-07-21 MED ORDER — ROCURONIUM BROMIDE 10 MG/ML (PF) SYRINGE
PREFILLED_SYRINGE | INTRAVENOUS | Status: DC | PRN
  Administered 2021-07-21: 60 mg via INTRAVENOUS

## 2021-07-21 MED ORDER — ONDANSETRON HCL 4 MG/2ML IJ SOLN
INTRAMUSCULAR | Status: AC
  Filled 2021-07-21: qty 2

## 2021-07-21 MED ORDER — LUTEIN 20 MG PO TABS: 20.0000 mg | ORAL_TABLET | Freq: Every morning | ORAL | Status: DC

## 2021-07-21 MED ORDER — 0.9 % SODIUM CHLORIDE (POUR BTL) OPTIME
TOPICAL | Status: DC | PRN
  Administered 2021-07-21: 1000 mL

## 2021-07-21 MED ORDER — DEXAMETHASONE SODIUM PHOSPHATE 10 MG/ML IJ SOLN
INTRAMUSCULAR | Status: DC | PRN
Start: 2021-07-21 — End: 2021-07-21
  Administered 2021-07-21: 10 mg via INTRAVENOUS

## 2021-07-21 MED ORDER — PANTOPRAZOLE SODIUM 40 MG IV SOLR: 40.0000 mg | Freq: Every day | INTRAVENOUS | Status: DC

## 2021-07-21 MED ORDER — SODIUM CHLORIDE 0.9% FLUSH: 3.0000 mL | INTRAVENOUS | Status: DC | PRN

## 2021-07-21 MED ORDER — CEFAZOLIN SODIUM-DEXTROSE 2-3 GM-%(50ML) IV SOLR
INTRAVENOUS | Status: DC | PRN
  Administered 2021-07-21: 2 g via INTRAVENOUS

## 2021-07-21 MED ORDER — SUGAMMADEX SODIUM 200 MG/2ML IV SOLN
INTRAVENOUS | Status: DC | PRN
  Administered 2021-07-21: 200 mg via INTRAVENOUS

## 2021-07-21 MED ORDER — OXYCODONE HCL 5 MG PO TABS: 10.0000 mg | ORAL_TABLET | ORAL | Status: DC | PRN

## 2021-07-21 MED ORDER — VITAMIN D 25 MCG (1000 UNIT) PO TABS
5000.0000 [IU] | ORAL_TABLET | Freq: Every morning | ORAL | Status: DC
  Administered 2021-07-21: 5000 [IU] via ORAL
  Filled 2021-07-21: qty 5

## 2021-07-21 MED ORDER — ALBUTEROL SULFATE (2.5 MG/3ML) 0.083% IN NEBU: 2.5000 mg | INHALATION_SOLUTION | Freq: Four times a day (QID) | RESPIRATORY_TRACT | Status: DC | PRN

## 2021-07-21 MED ORDER — SODIUM CHLORIDE 0.9 % IV SOLN
250.0000 mL | INTRAVENOUS | Status: DC
  Administered 2021-07-21: 250 mL via INTRAVENOUS

## 2021-07-21 MED ORDER — ASPIRIN EC 81 MG PO TBEC: 81.0000 mg | DELAYED_RELEASE_TABLET | ORAL | Status: DC

## 2021-07-21 MED ORDER — VENLAFAXINE HCL ER 37.5 MG PO CP24
37.5000 mg | ORAL_CAPSULE | Freq: Every day | ORAL | Status: DC
  Filled 2021-07-21: qty 1

## 2021-07-21 MED ORDER — ACETAMINOPHEN 325 MG PO TABS
650.0000 mg | ORAL_TABLET | ORAL | Status: DC | PRN
  Administered 2021-07-21: 650 mg via ORAL
  Filled 2021-07-21 (×2): qty 2

## 2021-07-21 MED ORDER — THROMBIN 20000 UNITS EX SOLR: CUTANEOUS | Status: DC | PRN

## 2021-07-21 MED ORDER — CYCLOBENZAPRINE HCL 10 MG PO TABS: 10.0000 mg | ORAL_TABLET | Freq: Three times a day (TID) | ORAL | Status: DC | PRN

## 2021-07-21 MED ORDER — ONDANSETRON HCL 4 MG/2ML IJ SOLN: 4.0000 mg | Freq: Four times a day (QID) | INTRAMUSCULAR | Status: DC | PRN

## 2021-07-21 MED ORDER — DEXAMETHASONE SODIUM PHOSPHATE 10 MG/ML IJ SOLN
INTRAMUSCULAR | Status: AC
  Filled 2021-07-21: qty 1

## 2021-07-21 MED ORDER — ONDANSETRON HCL 4 MG PO TABS: 4.0000 mg | ORAL_TABLET | Freq: Four times a day (QID) | ORAL | Status: DC | PRN

## 2021-07-21 MED ORDER — ACETAMINOPHEN 650 MG RE SUPP
650.0000 mg | RECTAL | Status: DC | PRN
  Filled 2021-07-21: qty 1

## 2021-07-21 MED ORDER — ROCURONIUM BROMIDE 10 MG/ML (PF) SYRINGE
PREFILLED_SYRINGE | INTRAVENOUS | Status: AC
  Filled 2021-07-21: qty 10

## 2021-07-21 MED ORDER — PROPOFOL 10 MG/ML IV BOLUS
INTRAVENOUS | Status: DC | PRN
  Administered 2021-07-21: 150 mg via INTRAVENOUS

## 2021-07-21 SURGICAL SUPPLY — 48 items
BAG COUNTER SPONGE SURGICOUNT (BAG) ×2 IMPLANT
BAND RUBBER #18 3X1/16 STRL (MISCELLANEOUS) ×4 IMPLANT
BENZOIN TINCTURE PRP APPL 2/3 (GAUZE/BANDAGES/DRESSINGS) ×2 IMPLANT
BLADE SURG 11 STRL SS (BLADE) ×2 IMPLANT
BUR CUTTER 7.0 ROUND (BURR) ×2 IMPLANT
BUR MATCHSTICK NEURO 3.0 LAGG (BURR) ×2 IMPLANT
CANISTER SUCT 3000ML PPV (MISCELLANEOUS) ×2 IMPLANT
CARTRIDGE OIL MAESTRO DRILL (MISCELLANEOUS) ×1 IMPLANT
CLSR STERI-STRIP ANTIMIC 1/2X4 (GAUZE/BANDAGES/DRESSINGS) ×2 IMPLANT
DECANTER SPIKE VIAL GLASS SM (MISCELLANEOUS) ×2 IMPLANT
DERMABOND ADHESIVE PROPEN (GAUZE/BANDAGES/DRESSINGS) ×1
DERMABOND ADVANCED (GAUZE/BANDAGES/DRESSINGS) ×1
DERMABOND ADVANCED .7 DNX12 (GAUZE/BANDAGES/DRESSINGS) ×1 IMPLANT
DERMABOND ADVANCED .7 DNX6 (GAUZE/BANDAGES/DRESSINGS) ×1 IMPLANT
DIFFUSER DRILL AIR PNEUMATIC (MISCELLANEOUS) ×2 IMPLANT
DRAPE HALF SHEET 40X57 (DRAPES) ×4 IMPLANT
DRAPE LAPAROTOMY 100X72X124 (DRAPES) ×2 IMPLANT
DRAPE MICROSCOPE LEICA (MISCELLANEOUS) ×2 IMPLANT
DRAPE SURG 17X23 STRL (DRAPES) ×2 IMPLANT
DRSG OPSITE POSTOP 4X6 (GAUZE/BANDAGES/DRESSINGS) ×2 IMPLANT
DURAPREP 26ML APPLICATOR (WOUND CARE) ×2 IMPLANT
ELECT REM PT RETURN 9FT ADLT (ELECTROSURGICAL) ×2
ELECTRODE REM PT RTRN 9FT ADLT (ELECTROSURGICAL) ×1 IMPLANT
GAUZE 4X4 16PLY ~~LOC~~+RFID DBL (SPONGE) ×2 IMPLANT
GAUZE SPONGE 4X4 12PLY STRL (GAUZE/BANDAGES/DRESSINGS) ×2 IMPLANT
GLOVE SURG ENC MOIS LTX SZ7 (GLOVE) ×2 IMPLANT
GLOVE SURG ENC MOIS LTX SZ8 (GLOVE) ×2 IMPLANT
GLOVE SURG UNDER LTX SZ8.5 (GLOVE) ×2 IMPLANT
GOWN STRL REUS W/ TWL LRG LVL3 (GOWN DISPOSABLE) ×1 IMPLANT
GOWN STRL REUS W/ TWL XL LVL3 (GOWN DISPOSABLE) ×2 IMPLANT
GOWN STRL REUS W/TWL LRG LVL3 (GOWN DISPOSABLE) ×1
GOWN STRL REUS W/TWL XL LVL3 (GOWN DISPOSABLE) ×2
KIT BASIN OR (CUSTOM PROCEDURE TRAY) ×2 IMPLANT
KIT TURNOVER KIT B (KITS) ×2 IMPLANT
NEEDLE HYPO 22GX1.5 SAFETY (NEEDLE) ×2 IMPLANT
NEEDLE SPNL 22GX3.5 QUINCKE BK (NEEDLE) ×2 IMPLANT
NS IRRIG 1000ML POUR BTL (IV SOLUTION) ×2 IMPLANT
OIL CARTRIDGE MAESTRO DRILL (MISCELLANEOUS) ×2
PACK LAMINECTOMY NEURO (CUSTOM PROCEDURE TRAY) ×2 IMPLANT
SPONGE SURGIFOAM ABS GEL SZ50 (HEMOSTASIS) ×2 IMPLANT
STRIP CLOSURE SKIN 1/2X4 (GAUZE/BANDAGES/DRESSINGS) ×2 IMPLANT
SUT VIC AB 0 CT1 18XCR BRD8 (SUTURE) ×1 IMPLANT
SUT VIC AB 0 CT1 8-18 (SUTURE) ×1
SUT VIC AB 2-0 CT1 18 (SUTURE) ×2 IMPLANT
SUT VICRYL 4-0 PS2 18IN ABS (SUTURE) ×2 IMPLANT
TOWEL GREEN STERILE (TOWEL DISPOSABLE) ×2 IMPLANT
TOWEL GREEN STERILE FF (TOWEL DISPOSABLE) ×2 IMPLANT
WATER STERILE IRR 1000ML POUR (IV SOLUTION) ×2 IMPLANT

## 2021-07-21 NOTE — Anesthesia Postprocedure Evaluation (Signed)
Anesthesia Post Note  Patient: Alicia [name]  Procedure(s) Performed: Laminectomy and Foraminotomy - L4-L5 - left (Left: Back)     Patient location during evaluation: PACU Anesthesia Type: General Level of consciousness: awake Pain management: pain level controlled Vital Signs Assessment: post-procedure vital signs reviewed and stable Respiratory status: spontaneous breathing Cardiovascular status: stable Postop Assessment: no apparent nausea or vomiting Anesthetic complications: no   No notable events documented.  Last Vitals:  Vitals:   07/21/21 0955 07/21/21 1000  BP:  (!) 177/68  Pulse: 87   Resp: 16   Temp:    SpO2: 92%     Last Pain:  Vitals:   07/21/21 1000  TempSrc:   PainSc: 6                  Zakya Halabi

## 2021-07-21 NOTE — Op Note (Signed)
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  Work-up revealed severe spinal stenosis possible disc herniation at L4-5 on the left.  Due to patient progression of clinical syndrome imaging findings and failed conservative treatment I recommended decompressive laminotomy foraminotomies partial facetectomy and possible microdiscectomy on the left at L4-5.  I extensively reviewed the risks and benefits of the operation with her as well as perioperative course expectations of outcome and alternatives of surgery and she understood and agreed to proceed forward.  Operative procedure: Patient was brought into the OR was Duson general anesthesia positioned prone Wilson frame her back was prepped and draped in routine sterile fashion preoperative x-ray localized the appropriate level so after infiltration of 10 cc lidocaine with epi midline incision was made and Bovie electrocautery was used to take down subcutaneous tissue and subperiosteal dissection was carried lamina of L4 and L5.  Intraoperative x-ray confirmed and identified the L4 pedicle so the interspace just below this was exposed laminotomy was begun with a high-speed drill and a 3 and 4 mm Kerrison punch.  Ligament fat was identified noting markedly hypertrophied was teased off of the dura with a nerve hook and removed in piecemeal fashion with a 3 and 4 mm Kerrison punch.  At this point the operating microscope was draped and brought into the field under microscopic nation I performed  foraminotomies the L5 nerve root there was marked severe stenosis from hypertrophy of the facet joint and hypertrophy of the ligamentum flavum this was all removed decompressing and unroofing the L5 nerve distal to the L5 pedicle.  Then marching superiorly under bit the medial facet complex removing a large spur causing thecal sac compression at the level of disc base disc base was inspected and was herniating and causing pressure in the ventral thecal sac so was incised and discectomy was performed with Epstein curettes and pituitary rongeurs.  At the end discectomy was no further stenosis on the thecal sac or left L5 nerve root the was copiously irrigated meticulous hemostasis was maintained Gelfoam was overlaid top of the dura and the muscle fascia approximate layers with active Vicryl skin was closed running 4 subcuticular Dermabond benzoin Steri-Strips and a sterile dressing was applied patient recovery in stable condition.  At the end the case all needle counts and sponge counts were correct.

## 2021-07-21 NOTE — Anesthesia Procedure Notes (Signed)
Procedure Name: Intubation Date/Time: 07/21/2021 7:48 AM Performed by: Valda Favia, CRNA Pre-anesthesia Checklist: Patient identified, Emergency Drugs available, Suction available and Patient being monitored Patient Re-evaluated:Patient Re-evaluated prior to induction Oxygen Delivery Method: Circle System Utilized Preoxygenation: Pre-oxygenation with 100% oxygen Induction Type: IV induction Ventilation: Mask ventilation without difficulty Laryngoscope Size: Mac and 4 Grade View: Grade I Tube type: Oral Tube size: 7.0 mm Number of attempts: 1 Airway Equipment and Method: Stylet and Oral airway Placement Confirmation: ETT inserted through vocal cords under direct vision, positive ETCO2 and breath sounds checked- equal and bilateral Secured at: 22 cm Tube secured with: Tape Dental Injury: Teeth and Oropharynx as per pre-operative assessment

## 2021-07-21 NOTE — Discharge Summary (Signed)
  Physician Discharge Summary  Patient ID: Alicia [name] MRN: [id number] DOB/AGE: 1946-08-24 75 y.o. Estimated body mass index is 41.99 kg/m as calculated from the following:   Height as of 07/10/21: 5' (1.524 m).   Weight as of this encounter: 97.5 kg.   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  Patient be discharged scheduled follow-up in 1 to 2 weeks.  Consults: Significant Diagnostic Studies: Treatments: Decompressive laminectomy and microdiscectomy L4-5 on the left Discharge Exam: Blood pressure (!) 164/74, pulse 74, temperature 97.7 F (36.5 C), temperature source Oral, resp. rate 17, weight 97.5 kg, SpO2 97 %. Strength 5 out of 5 wound clean dry and intact  Disposition: Home   Allergies as of 07/21/2021       Reactions   Penicillins Other (See Comments)   Yeast infection        Medication List     TAKE these medications    acetaminophen 500 MG tablet Commonly known as: TYLENOL Take 1,500 mg by mouth every 6 (six) hours as needed (pain.).   albuterol 108 (90 Base) MCG/ACT inhaler Commonly known as: VENTOLIN HFA Inhale 1-2 puffs into the lungs 4 (four) times daily as needed for shortness of breath or wheezing.   aspirin EC 81 MG tablet Take 81 mg by mouth every other day. In the morning   EYLEA IO Inject 1 Dose into the eye as directed. Every 6 weeks   gabapentin 300 MG capsule Commonly known as: NEURONTIN TAKE 1 CAPSULE BY MOUTH THREE TIMES A DAY   Lutein 20 MG  Tabs Take 20 mg by mouth in the morning.   omeprazole 20 MG capsule Commonly known as: PRILOSEC Take 20 mg by mouth in the morning.   OPTIVE 0.5-0.9 % ophthalmic solution Generic drug: carboxymethylcellul-glycerin Place 1-2 drops into both eyes 3 (three) times daily as needed for dry eyes.   pramipexole 0.5 MG tablet Commonly known as: MIRAPEX Take 0.5 mg 2 (two) times daily by mouth.   venlafaxine XR 37.5 MG 24 hr capsule Commonly known as: EFFEXOR-XR Take 37.5 mg by mouth at bedtime.   Vitamin D-3 125 MCG (5000 UT) Tabs Take 5,000 Units by mouth in the morning.         Signed: Elaina Hoops 07/21/2021, 4:34 PM

## 2021-07-21 NOTE — Evaluation (Signed)
Physical Therapy Evaluation Patient Details Name: Alicia [name] MRN: [id number] DOB: 1945-12-25 Today's Date: 07/21/2021  History of Present Illness  Pt is 75 yo female s/p L4-5 laminectomy and decompression on 07/21/21.  Sh has hx including carotid artery occlusion, macular degeneration, and sleep apnea  Clinical Impression  Patient is s/p above surgery resulting in the deficits listed below (see PT Problem List). At baseline pt is independent with a cane.  She plans to d/c to a friend's house that is easily accessible and she will have support.  During PT evaluation, pt demonstrating excellent understanding of back precautions.  She ambulated 31' with cane and HHA and performed 1 step.Advised use of RW since she prefers 2 hand support.   Patient will benefit from skilled PT to increase their independence and safety with mobility (while adhering to their precautions) to allow discharge to the venue listed below.        Recommendations for follow up therapy are one component of a multi-disciplinary discharge planning process, led by the attending physician.  Recommendations may be updated based on patient status, additional functional criteria and insurance authorization.  Follow Up Recommendations No PT follow up;Supervision for mobility/OOB    Equipment Recommendations  None recommended by PT    Recommendations for Other Services       Precautions / Restrictions Precautions Precautions: Back Precaution Booklet Issued: Yes (comment) Precaution Comments: back handout issued Restrictions Weight Bearing Restrictions: No      Mobility  Bed Mobility Overal bed mobility: Needs Assistance Bed Mobility: Rolling;Sidelying to Sit;Sit to Sidelying Rolling: Supervision Sidelying to sit: Supervision     Sit to sidelying: Supervision General bed mobility comments: Pt demonstrated an excellent log roll but with increased time    Transfers Overall transfer level: Needs  assistance Equipment used: Straight cane Transfers: Sit to/from Stand Sit to Stand: Min guard         General transfer comment: min guard for safety  Ambulation/Gait Ambulation/Gait assistance: Min guard Gait Distance (Feet): 70 Feet Assistive device: 1 person hand held assist;Straight cane Gait Pattern/deviations: Step-to pattern;Decreased stride length Gait velocity: decreased   General Gait Details: Pt utilizing can and hand rail or HHA.  Slow but steady gait.  Good maintenance of back precautions with turns.  Did recommend RW as pt wanting bil UE support  Stairs Stairs: Yes Stairs assistance: Min guard Stair Management: With cane;Step to pattern Number of Stairs: 1 General stair comments: performed without difficulty  Wheelchair Mobility    Modified Rankin (Stroke Patients Only)       Balance Overall balance assessment: Needs assistance Sitting-balance support: No upper extremity supported Sitting balance-Leahy Scale: Good     Standing balance support: Single extremity supported Standing balance-Leahy Scale: Poor Standing balance comment: requiring UE support but stable with UE support                             Pertinent Vitals/Pain Pain Assessment: 0-10 Pain Score: 2  Pain Location: back Pain Descriptors / Indicators:  (stinging) Pain Intervention(s): Limited activity within patient's tolerance;Monitored during session;RN gave pain meds during session    Home Living Family/patient expects to be discharged to:: Private residence Living Arrangements: Other relatives Available Help at Discharge: Family;Friend(s);Available 24 hours/day Type of Home: House Home Access: Stairs to enter Entrance Stairs-Rails: None Entrance Stairs-Number of Steps: 1 Home Layout: One level Home Equipment: Cane - single point;Walker - 2 wheels  Prior Function Level of Independence: Independent with assistive device(s)         Comments: Pt was  independent with use of a cane     Hand Dominance        Extremity/Trunk Assessment   Upper Extremity Assessment Upper Extremity Assessment: Overall WFL for tasks assessed    Lower Extremity Assessment Lower Extremity Assessment: Overall WFL for tasks assessed (ROM WFL (except limited PF L ankle baseline due to sx); MMT: at least 3/5 but not further tested due to back sx)    Cervical / Trunk Assessment Cervical / Trunk Assessment: Normal  Communication   Communication: No difficulties  Cognition Arousal/Alertness: Awake/alert Behavior During Therapy: WFL for tasks assessed/performed Overall Cognitive Status: Within Functional Limits for tasks assessed                                        General Comments General comments (skin integrity, edema, etc.): Educated on back precautions - pt able to recall and demonstrated excellent understanding with mobility    Exercises     Assessment/Plan    PT Assessment Patient needs continued PT services  PT Problem List Decreased strength;Decreased mobility;Decreased activity tolerance;Decreased balance;Decreased knowledge of use of DME;Pain       PT Treatment Interventions DME instruction;Therapeutic activities;Gait training;Therapeutic exercise;Patient/family education;Stair training;Functional mobility training;Balance training;Modalities    PT Goals (Current goals can be found in the Care Plan section)  Acute Rehab PT Goals Patient Stated Goal: return home PT Goal Formulation: With patient/family Time For Goal Achievement: 08/04/21 Potential to Achieve Goals: Good    Frequency Min 5X/week   Barriers to discharge        Co-evaluation               AM-PAC PT "6 Clicks" Mobility  Outcome Measure Help needed turning from your back to your side while in a flat bed without using bedrails?: A Little Help needed moving from lying on your back to sitting on the side of a flat bed without using bedrails?:  A Little Help needed moving to and from a bed to a chair (including a wheelchair)?: A Little Help needed standing up from a chair using your arms (e.g., wheelchair or bedside chair)?: A Little Help needed to walk in hospital room?: A Little Help needed climbing 3-5 steps with a railing? : A Little 6 Click Score: 18    End of Session Equipment Utilized During Treatment: Gait belt Activity Tolerance: Patient tolerated treatment well Patient left: in bed;with call bell/phone within reach;with bed alarm set;with family/visitor present Nurse Communication: Mobility status PT Visit Diagnosis: Other abnormalities of gait and mobility (R26.89);Pain Pain - part of body:  (back)    Time: 1355-1420 PT Time Calculation (min) (ACUTE ONLY): 25 min   Charges:   PT Evaluation $PT Eval Low Complexity: 1 Low PT Treatments $Gait Training: 8-22 mins        Abran Richard, PT Acute Rehab Services Pager [phone] [facility] 07/21/2021, 2:28 PM

## 2021-07-21 NOTE — Transfer of Care (Signed)
Immediate Anesthesia Transfer of Care Note  Patient: Alicia [name]  Procedure(s) Performed: Laminectomy and Foraminotomy - L4-L5 - left (Left: Back)  Patient Location: PACU  Anesthesia Type:General  Level of Consciousness: drowsy  Airway & Oxygen Therapy: Patient Spontanous Breathing and Patient connected to nasal cannula oxygen  Post-op Assessment: Report given to RN and Post -op Vital signs reviewed and stable  Post vital signs: Reviewed and stable  Last Vitals:  Vitals Value Taken Time  BP 169/70 07/21/21 0946  Temp    Pulse 92 07/21/21 0949  Resp 18 07/21/21 0949  SpO2 92 % 07/21/21 0949  Vitals shown include unvalidated device data.  Last Pain:  Vitals:   07/21/21 0620  TempSrc: Oral  PainSc:          Complications: No notable events documented.

## 2021-07-21 NOTE — Plan of Care (Signed)
WNL

## 2021-07-21 NOTE — Discharge Instructions (Signed)

## 2021-07-21 NOTE — Progress Notes (Signed)
Occupational Therapy Evaluation Completed all education regarding compensatory strategies with use of AE and DME while adhering to back precautions. Handout provided and reviewed. Pt will have adequate support to DC home with family/friend. No further OT needed.  Pt would benefit from 3in1 to use as shower chair    07/21/21 1500  OT Visit Information  Last OT Received On 07/21/21  Assistance Needed +1  History of Present Illness Pt is 75 yo female s/p L4-5 laminectomy and decompression on 07/21/21.  Sh has hx including carotid artery occlusion, macular degeneration, and sleep apnea  Precautions  Precautions Back  Precaution Booklet Issued Yes (comment)  Home Living  Family/patient expects to be discharged to: Private residence  Living Arrangements Other relatives  Available Help at Discharge Family;Friend(s);Available 24 hours/day  Type of Home House  Home Access Stairs to enter  Entrance Stairs-Number of Steps 1  Entrance Stairs-Rails None  Home Layout One level  Bathroom Shower/Tub Walk-in shower  Bathroom Toilet Handicapped height  Bathroom Accessibility Yes  Home Equipment [place] - single point;Walker - 2 wheels  Prior Function  Level of Independence Independent with assistive device(s)  Comments Pt was independent with use of a cane  Pain Assessment  Pain Assessment Faces  Faces Pain Scale 2  Pain Location back  Pain Descriptors / Indicators Burning;Operative site guarding  Pain Intervention(s) Limited activity within patient's tolerance  Cognition  Arousal/Alertness Awake/alert  Behavior During Therapy WFL for tasks assessed/performed  Overall Cognitive Status Within Functional Limits for tasks assessed  Upper Extremity Assessment  Upper Extremity Assessment Overall WFL for tasks assessed (Hx of shoulder deficits; plans to have surgery)  Lower Extremity Assessment  Lower Extremity Assessment Defer to PT evaluation  Cervical / Trunk Assessment  Cervical / Trunk  Assessment Other exceptions (back surgery)  ADL  Overall ADL's  Needs assistance/impaired  Functional mobility during ADLs Supervision/safety;Cane  General ADL Comments Uses AE at baseline at times. Able to complete figure four position for LB ADL. Educated on use of AE for LB bathing and dressing and pericare after BM; educated on compensatory strategies for grooming and IADL tasks; verbalized understanding.  Vision- History  Baseline Vision/History 1 Wears glasses;6 Macular Degeneration  Patient Visual Report  (MD)  Bed Mobility  Overal bed mobility Needs Assistance  Rolling Supervision  Sidelying to sit Supervision  General bed mobility comments Educated on technique  Transfers  Overall transfer level Needs assistance  Transfers Sit to/from Stand  Sit to Stand Supervision  Balance  Sitting balance-Leahy Scale Good  Standing balance-Leahy Scale Fair  General Comments  General comments (skin integrity, edema, etc.) niece present for education  OT - End of Session  Equipment Utilized During Treatment  (cane)  Activity Tolerance Patient tolerated treatment well  Patient left Other (comment) (in bathroom - nsg made awre)  OT Assessment  OT Recommendation/Assessment Patient does not need any further OT services  OT Visit Diagnosis Unsteadiness on feet (R26.81);Muscle weakness (generalized) (M62.81);Pain  Pain - Right/Left  (back)  OT Problem List Impaired balance (sitting and/or standing);Decreased knowledge of use of DME or AE;Decreased safety awareness;Obesity;Pain  AM-PAC OT "6 Clicks" Daily Activity Outcome Measure (Version 2)  Help from another person eating meals? 4  Help from another person taking care of personal grooming? 4  Help from another person toileting, which includes using toliet, bedpan, or urinal? 3  Help from another person bathing (including washing, rinsing, drying)? 3  Help from another person to put on and taking off regular upper  body clothing? 4  Help  from another person to put on and taking off regular lower body clothing? 3  6 Click Score 21  Progressive Mobility  What is the highest level of mobility based on the progressive mobility assessment? Level 5 (Walks with assist in room/hall) - Balance while stepping forward/back and can walk in room with assist - Complete  OT Recommendation  Follow Up Recommendations No OT follow up;Supervision - Intermittent  OT Equipment 3 in 1 bedside commode  Acute Rehab OT Goals  Patient Stated Goal return home  OT Goal Formulation All assessment and education complete, DC therapy  OT Time Calculation  OT Start Time (ACUTE ONLY) 1254  OT Stop Time (ACUTE ONLY) 1316  OT Time Calculation (min) 22 min  OT General Charges  $OT Visit 1 Visit  OT Evaluation  $OT Eval Low Complexity [place], OT/L   Acute OT Clinical Specialist Acute Rehabilitation Services Pager [phone] Office [phone]

## 2021-07-21 NOTE — H&P (Signed)
Alicia [name] is an 75 y.o. female.   Chief Complaint: Back and left leg pain HPI: 75 year old female with back and left hip and leg pain rating down L5 nerve root pattern she will weakness in dorsiflexion weakness in left EHL.  Work-up is revealed severe lateral recess stenosis combination of marked facet arthropathy and disc bulge due to progression of clinical syndrome weakness on clinical exam and imaging findings of recommended decompressive laminectomy on the left at L4-5 as well as possible microdiscectomy.  I extensively gone over the risks and benefits of that operation with her as well as perioperative course expectations of outcome and alternatives of surgery and she understands and agrees to proceed forward.  Past Medical History:  Diagnosis Date   Carotid artery occlusion    History of hiatal hernia    Macular degeneration    Sleep apnea     Past Surgical History:  Procedure Laterality Date   CHOLECYSTECTOMY  10/2016   EYE SURGERY Bilateral 2017   cataracts removed   PAROTID GLAND TUMOR EXCISION Left 2003    Family History  Problem Relation Age of Onset   Heart disease Mother    Cerebral aneurysm Father    Social History:  reports that she has been smoking cigarettes. She has a 60.00 pack-year smoking history. She has never used smokeless tobacco. She reports that she does not currently use alcohol. She reports that she does not use drugs.  Allergies:  Allergies  Allergen Reactions   Penicillins Other (See Comments)    Yeast infection     Medications Prior to Admission  Medication Sig Dispense Refill   acetaminophen (TYLENOL) 500 MG tablet Take 1,500 mg by mouth every 6 (six) hours as needed (pain.).     Aflibercept (EYLEA IO) Inject 1 Dose into the eye as directed. Every 6 weeks     albuterol (VENTOLIN HFA) 108 (90 Base) MCG/ACT inhaler Inhale 1-2 puffs into the lungs 4 (four) times daily as needed for shortness of breath or wheezing.     aspirin EC 81 MG  tablet Take 81 mg by mouth every other day. In the morning     Cholecalciferol (VITAMIN D-3) 125 MCG (5000 UT) TABS Take 5,000 Units by mouth in the morning.     gabapentin (NEURONTIN) 300 MG capsule TAKE 1 CAPSULE BY MOUTH THREE TIMES A DAY 270 capsule 3   Lutein 20 MG TABS Take 20 mg by mouth in the morning.     omeprazole (PRILOSEC) 20 MG capsule Take 20 mg by mouth in the morning.  1   pramipexole (MIRAPEX) 0.5 MG tablet Take 0.5 mg 2 (two) times daily by mouth.  1   venlafaxine XR (EFFEXOR-XR) 37.5 MG 24 hr capsule Take 37.5 mg by mouth at bedtime.     carboxymethylcellul-glycerin (OPTIVE) 0.5-0.9 % ophthalmic solution Place 1-2 drops into both eyes 3 (three) times daily as needed for dry eyes.      No results found for this or any previous visit (from the past 48 hour(s)). No results found.  Review of Systems  Musculoskeletal:  Positive for back pain.  Neurological:  Positive for weakness and numbness.   Blood pressure (!) 168/63, pulse 72, temperature 97.8 F (36.6 C), temperature source Oral, resp. rate 20, weight 97.5 kg. Physical Exam HENT:     Head: Normocephalic.     Right Ear: Tympanic membrane normal.     Nose: Nose normal.     Mouth/Throat:     Mouth: Mucous  membranes are moist.  Pulmonary:     Effort: Pulmonary effort is normal.  Abdominal:     General: Abdomen is flat.  Musculoskeletal:        General: Normal range of motion.  Skin:    General: Skin is warm.  Neurological:     Mental Status: She is alert.     Comments: Strength has left-sided slight foot drop 4+ out of 5 left EHL weakness at 4 out of 5 otherwise she is 5 out of 5 lower extremities bilaterally     Assessment/Plan 75 year old presents for left-sided decompressive laminectomy partial medial facetectomy foraminotomy and microdiscectomy  Elaina Hoops, MD 07/21/2021, 7:40 AM

## 2021-07-21 NOTE — Progress Notes (Signed)
Patient alert and oriented, voiding adequately, skin clean, dry and intact without evidence of skin break down, or symptoms of complications - no redness or edema noted, only slight tenderness at site.  Patient states pain is manageable at time of discharge. Patient has an appointment with MD in 2 weeks 

## 2021-07-22 ENCOUNTER — Encounter (HOSPITAL_COMMUNITY): Payer: Self-pay | Admitting: Neurosurgery

## 2021-07-22 MED FILL — Thrombin (Recombinant) For Soln 20000 Unit: CUTANEOUS | Qty: 1 | Status: AC

## 2021-08-22 ENCOUNTER — Other Ambulatory Visit: Payer: Self-pay

## 2021-08-22 ENCOUNTER — Ambulatory Visit: Payer: Self-pay

## 2021-08-22 ENCOUNTER — Encounter: Payer: Self-pay | Admitting: Surgical

## 2021-08-22 ENCOUNTER — Ambulatory Visit (INDEPENDENT_AMBULATORY_CARE_PROVIDER_SITE_OTHER): Payer: Medicare Other | Admitting: Surgical

## 2021-08-22 DIAGNOSIS — G8929 Other chronic pain: Secondary | ICD-10-CM

## 2021-08-22 DIAGNOSIS — M25562 Pain in left knee: Secondary | ICD-10-CM

## 2021-08-24 ENCOUNTER — Encounter: Payer: Self-pay | Admitting: Surgical

## 2021-08-24 MED ORDER — METHYLPREDNISOLONE ACETATE 40 MG/ML IJ SUSP
40.0000 mg | INTRAMUSCULAR | Status: AC | PRN
  Administered 2021-08-22: 40 mg via INTRA_ARTICULAR

## 2021-08-24 MED ORDER — BUPIVACAINE HCL 0.25 % IJ SOLN
4.0000 mL | INTRAMUSCULAR | Status: AC | PRN
  Administered 2021-08-22: 4 mL via INTRA_ARTICULAR

## 2021-08-24 MED ORDER — LIDOCAINE HCL 1 % IJ SOLN
5.0000 mL | INTRAMUSCULAR | Status: AC | PRN
  Administered 2021-08-22: 5 mL

## 2021-08-24 NOTE — Progress Notes (Addendum)
Office Visit Note   Patient: Alicia [name]           Date of Birth: 02-Nov-1946           MRN: [id number] Visit Date: 08/22/2021 Requested by: No referring provider defined for this encounter. PCP: Pcp, No  Subjective: Chief Complaint  Patient presents with   Left Knee - Follow-up    HPI: Alicia [name] is a 75 y.o. female who presents to the office complaining of left knee pain.  Patient states that she has been noticing more left knee pain ever since her back surgery about a month ago.  She had surgery by Dr. Saintclair Halsted on 07/21/2021 and she reports this is helped with her back and radicular symptoms.  She is pleased with how she is recovering aside from the developing this left knee pain.  She localizes pain to the anterior and medial aspect of the knee.  Pain is worse with ambulating.  She walks with a cane.  She is taking over-the-counter Tylenol for pain control.  She has no history of prior knee surgeries or any recent injections.  She has had injection in the left knee that she states was about a decade ago and provided excellent lasting relief for her with no recurrence.  She has no mechanical symptoms or instability of the knee.  No falls or injury since her surgery date.  She also reports that her right shoulder is doing very well following the previous injection but the left shoulder is causing her a lot of pain as well.  She had ultrasound-guided left glenohumeral joint injection at her last office visit and reports that this helped for about 2 weeks but now has worn off.  She also has had subacromial injection in the right shoulder with no recurrence of her symptoms at this time.  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                ROS: All systems reviewed are negative as they relate to the chief complaint within the history of present illness.  Patient denies fevers or  chills.  Assessment & Plan: Visit Diagnoses:  1. Chronic pain of left knee     Plan: Patient is a 75 year old female who presents complaining of left knee pain.  She has had increasing knee pain since her surgery by Dr. Saintclair Halsted but no history of injury.  Radiographs taken today demonstrate no significant degenerative changes or any acute findings.  She would like to try cortisone injection for the left knee.  She tolerated the procedure well.  Plan for her to follow-up with the office as needed if pain does not improve.  She does continue to complain of left shoulder pain though she had 2 weeks of relief from prior ultrasound-guided glenohumeral joint injection.  She has glenohumeral joint arthritis and is not considering surgery at this time as she just wants to recover from her back surgery.  She will follow-up with the office as needed.  Follow-Up Instructions: No follow-ups on file.   Orders:  Orders Placed This Encounter  Procedures   XR KNEE 3 VIEW LEFT   No orders of the defined types were placed in this encounter.     Procedures: Large Joint Inj: L knee on 08/22/2021 11:01 AM Indications: diagnostic evaluation, joint swelling and pain Details: 18 G 1.5 in needle, superolateral approach  Arthrogram: No  Medications: 5 mL lidocaine 1 %; 40 mg methylPREDNISolone acetate 40 MG/ML; 4 mL bupivacaine 0.25 % Outcome: tolerated well, no immediate complications Procedure, treatment alternatives, risks and benefits explained, specific risks discussed. Consent was given by the patient. Immediately prior to procedure a time out was called to verify the correct patient, procedure, equipment, support staff and site/side marked as required. Patient was prepped and draped in the usual sterile fashion.      Clinical Data: No additional findings.  Objective: Vital Signs: There were no vitals taken for this visit.  Physical Exam:  Constitutional: Patient appears well-developed HEENT:   Head: Normocephalic Eyes:EOM are normal Neck: Normal range of motion Cardiovascular: Normal rate Pulmonary/chest: Effort normal Neurologic: Patient is alert Skin: Skin is warm Psychiatric: Patient has normal mood and affect  Ortho Exam: Ortho exam demonstrates left knee with no effusion.  0 degrees extension and 120 degrees of knee flexion.  No calf tenderness.  Negative Homans' sign.  Able to perform straight leg raise.  No pain with hip range of motion.  Tenderness over the medial joint line.  No tenderness over the lateral joint line.  No laxity to anterior posterior drawer.  No laxity to varus/valgus stress at 0 or 30 degrees.  Specialty Comments:  No specialty comments available.  Imaging: No results found.   PMFS History: Patient Active Problem List   Diagnosis Date Noted   Spinal stenosis at L4-L5 level 07/21/2021   Chronic venous insufficiency 02/29/2020   Pain in joint, ankle and foot 02/29/2020   Spondylosis without myelopathy or radiculopathy, lumbar region 01/05/2019   Foraminal stenosis of lumbar region 01/05/2019   Arthritis 06/08/2018   GERD (gastroesophageal reflux disease) 06/08/2018   Macular degeneration 06/08/2018   Sleep apnea 06/08/2018   Trouble swallowing 06/08/2018   Past Medical History:  Diagnosis Date   Carotid artery occlusion    History of hiatal hernia    Macular degeneration    Sleep apnea     Family History  Problem Relation Age of Onset   Heart disease Mother    Cerebral aneurysm Father     Past Surgical History:  Procedure Laterality Date   CHOLECYSTECTOMY  10/2016   EYE SURGERY Bilateral 2017   cataracts removed   LUMBAR LAMINECTOMY/DECOMPRESSION MICRODISCECTOMY Left 07/21/2021   Procedure: Laminectomy and Foraminotomy - L4-L5 - left;  Surgeon: Kary Kos, MD;  Location: [facility];  Service: Neurosurgery;  Laterality: Left;  3C   PAROTID GLAND TUMOR EXCISION Left 2003   Social History   Occupational History   Not on file   Tobacco Use   Smoking status: Every Day    Packs/day: 1.00    Years: 60.00    Pack years: 60.00    Types: Cigarettes   Smokeless tobacco: Never  Vaping Use   Vaping Use: Never used  Substance and Sexual Activity   Alcohol use: Not Currently   Drug use: Never   Sexual activity: Not Currently

## 2021-09-12 ENCOUNTER — Other Ambulatory Visit: Payer: Self-pay | Admitting: Physical Medicine and Rehabilitation

## 2021-09-12 NOTE — Telephone Encounter (Signed)
Please advise 

## 2021-09-24 ENCOUNTER — Other Ambulatory Visit: Payer: Self-pay | Admitting: Gastroenterology

## 2021-09-24 DIAGNOSIS — Z720 Tobacco use: Secondary | ICD-10-CM

## 2021-10-06 ENCOUNTER — Other Ambulatory Visit: Payer: Self-pay | Admitting: Physician Assistant

## 2021-10-06 DIAGNOSIS — Z1231 Encounter for screening mammogram for malignant neoplasm of breast: Secondary | ICD-10-CM

## 2021-10-18 ENCOUNTER — Ambulatory Visit
Admission: RE | Admit: 2021-10-18 | Discharge: 2021-10-18 | Disposition: A | Discharge status: Home / Self Care | Payer: Medicare Other | Source: Ambulatory Visit | Attending: Gastroenterology | Admitting: Gastroenterology

## 2021-10-18 DIAGNOSIS — Z720 Tobacco use: Secondary | ICD-10-CM

## 2021-11-07 ENCOUNTER — Ambulatory Visit: Payer: Medicare Other

## 2021-11-10 IMAGING — CR DG LUMBAR SPINE 2-3V
2 series · 2 of 2 positions shown · non-contrast
Comparison: 12/05/2020

CLINICAL DATA: Spinal surgery, intraoperative localization
examination

EXAM:
LUMBAR SPINE - 2-3 VIEW

[xtable lateral (1 of 2)]
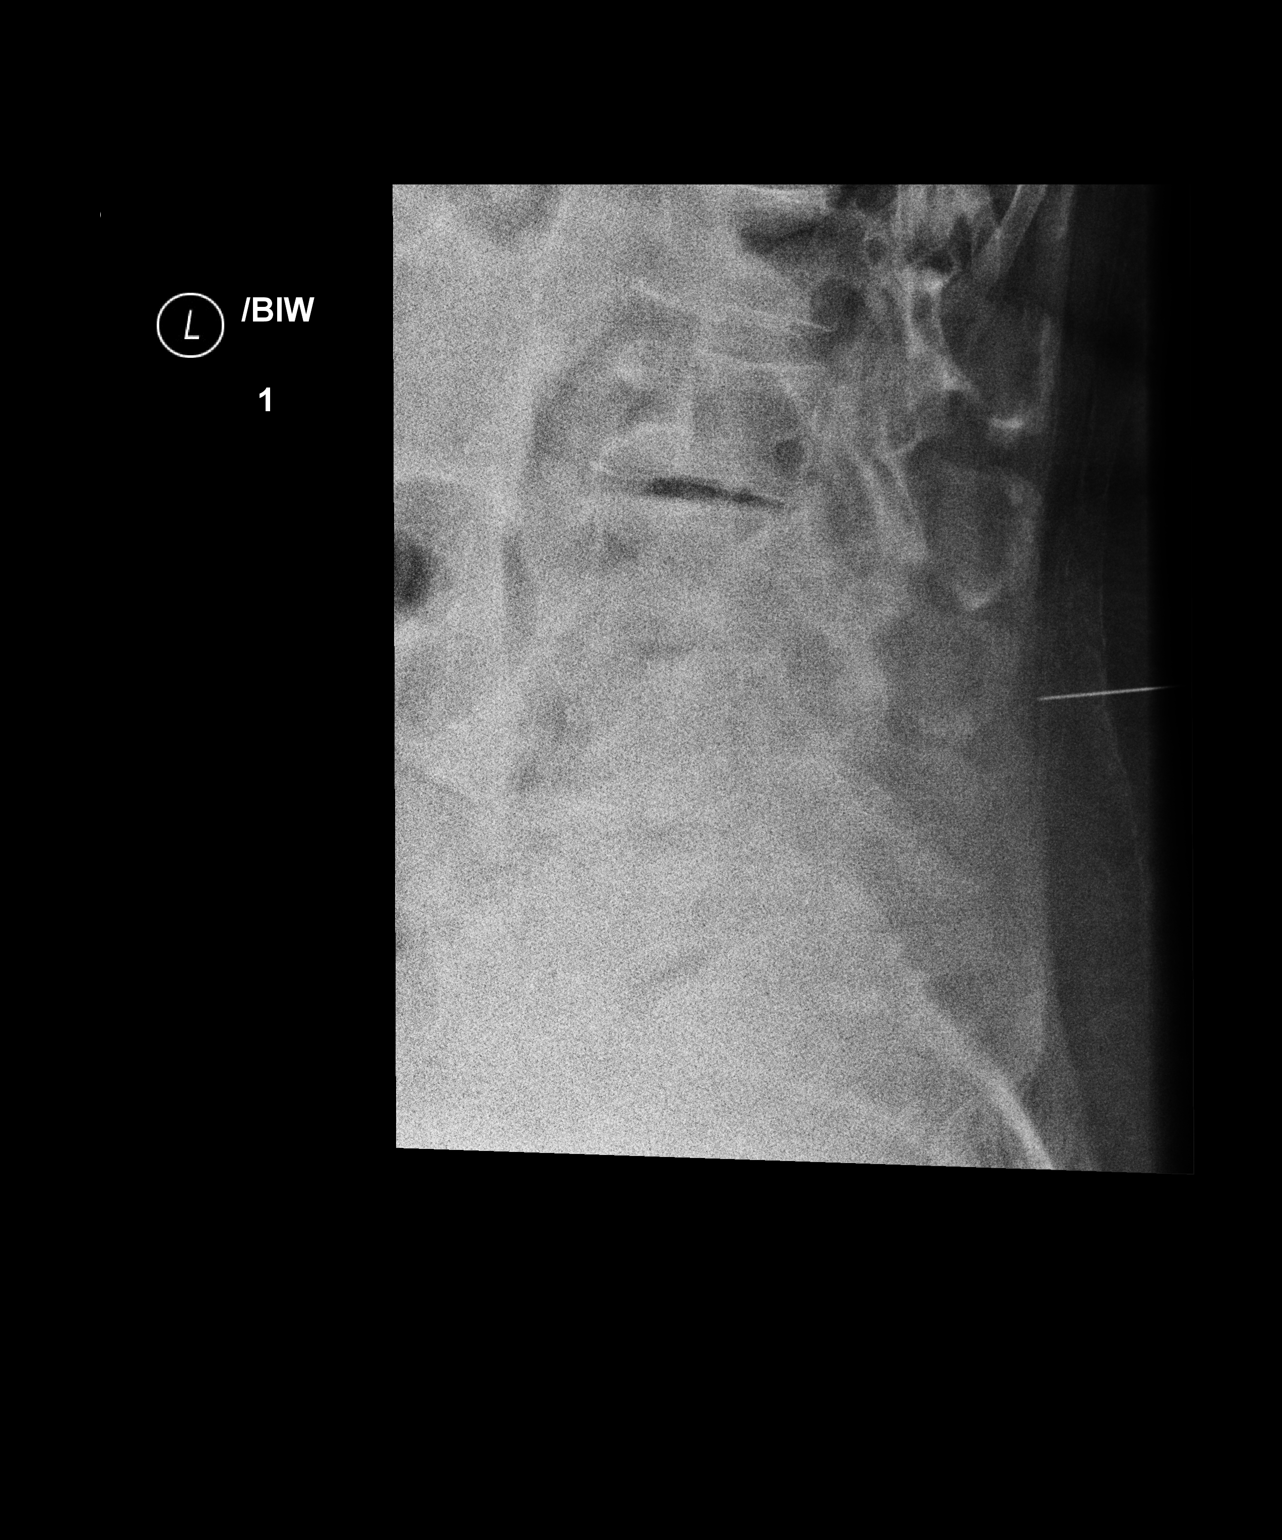

[xtable lateral (2 of 2)]
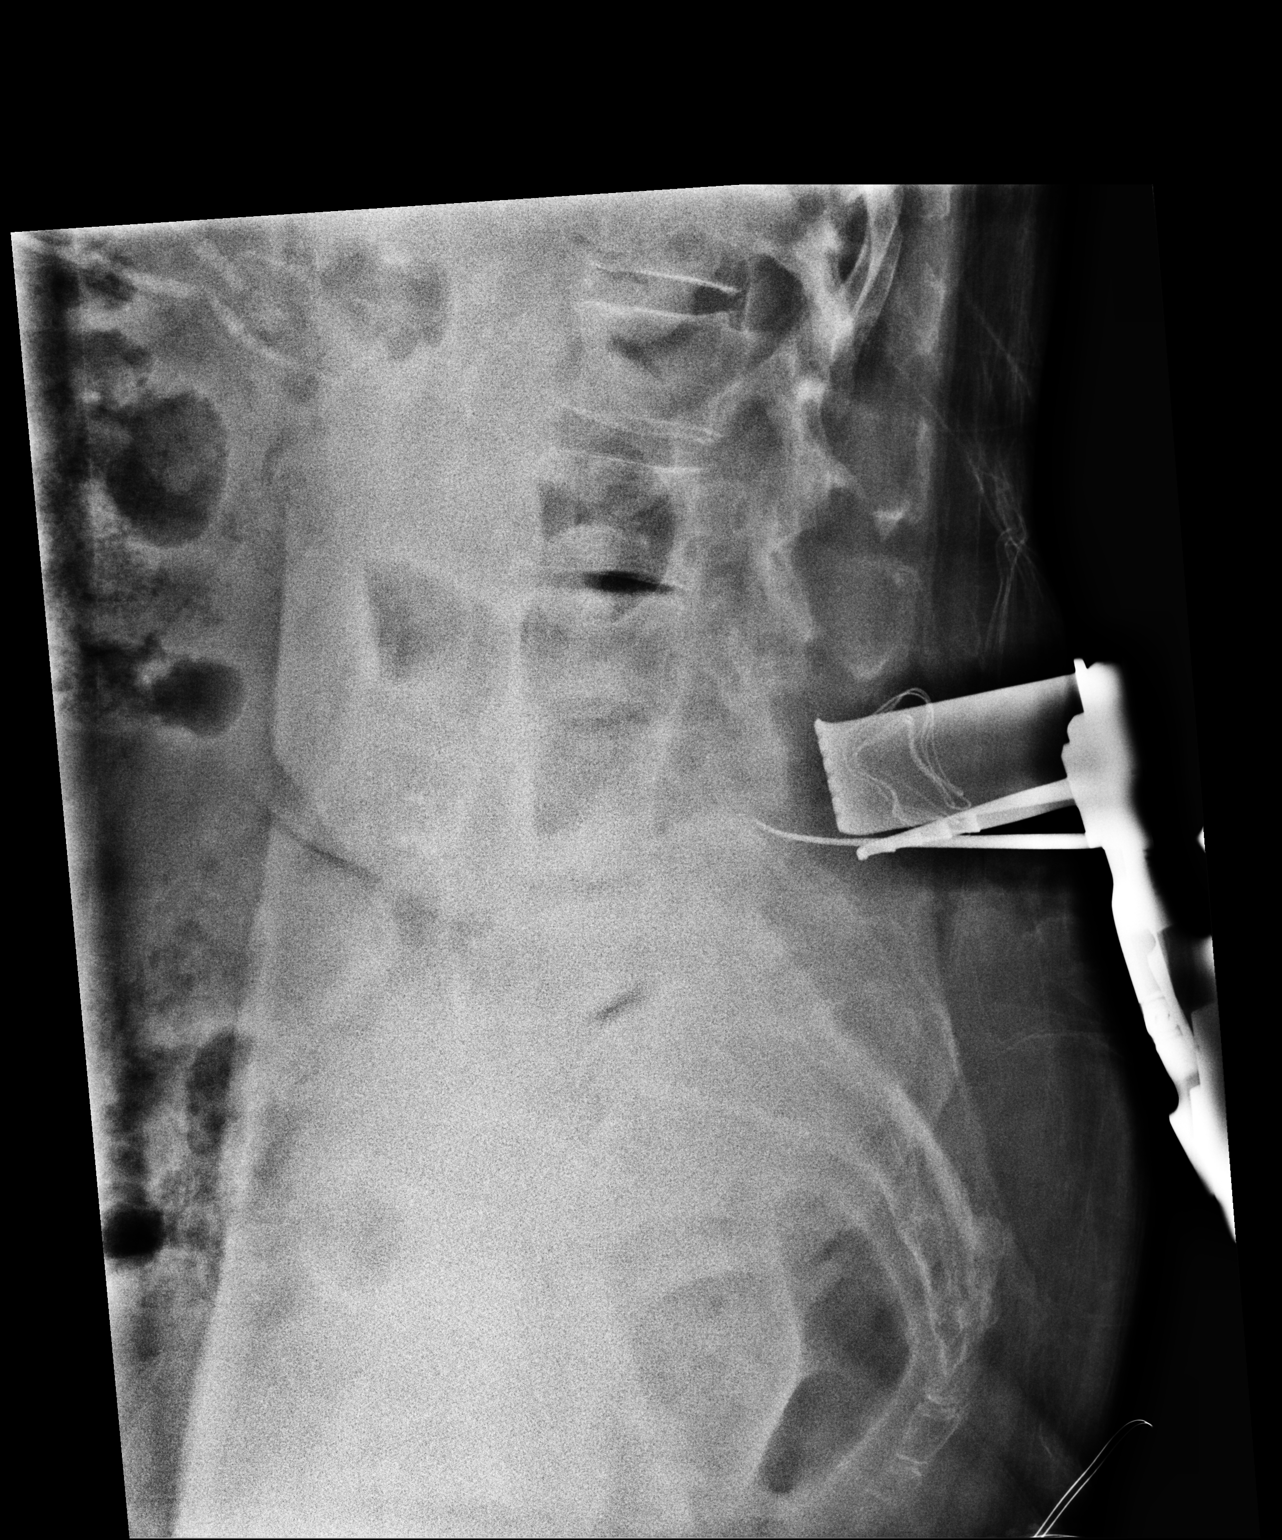

[2 of 2 positions shown; findings below may reference images not displayed]

FINDINGS: Intra operative fluoroscopic cross-table lateral radiographs of the
lumbar spine are presented for interpretation postprocedurally.
These images are limited by underpenetration.

Radiograph obtained at [DATE] a.m. demonstrates a a needle-like
metallic marker at the level of the vertebral body of L4, posterior
to the spinous process of L3. Advanced degenerative disc disease
noted at L2-3. Underpenetration limits evaluation of the lumbar
spine at L3-S1, however, changes of degenerative disc disease are
suspected at these levels.

Subsequent radiograph obtained at [DATE] a.m. demonstrates a curved
metallic probe posterior to the pedicle of L4 with soft tissue
retractors and Thoko seen within the a soft tissues posterior to
this level.
IMPRESSION: Intraoperative radiographs as described above.

## 2021-11-28 ENCOUNTER — Ambulatory Visit
Admission: RE | Admit: 2021-11-28 | Discharge: 2021-11-28 | Disposition: A | Discharge status: Home / Self Care | Payer: PPO | Source: Ambulatory Visit | Attending: Physician Assistant | Admitting: Physician Assistant

## 2021-11-28 DIAGNOSIS — Z1231 Encounter for screening mammogram for malignant neoplasm of breast: Secondary | ICD-10-CM

## 2021-12-01 ENCOUNTER — Telehealth: Payer: Self-pay

## 2021-12-01 NOTE — Telephone Encounter (Signed)
Pt c/o months of cramping and rest pain in BLE that is mostly at night and wakes her up. Per APP, since she had lumbar spine surgery a few months ago, she is to let her spine surgeon know and will f/u if he feels it is related to her circulation. Pt verbalized understanding.

## 2021-12-01 NOTE — Telephone Encounter (Signed)
Returned pt's call regarding her message left on triage line c/o night cramps that are occurring hourly. I was told pt was resting and she would call us back.

## 2021-12-26 ENCOUNTER — Ambulatory Visit (INDEPENDENT_AMBULATORY_CARE_PROVIDER_SITE_OTHER): Payer: PPO | Admitting: Family

## 2021-12-26 ENCOUNTER — Encounter: Payer: Self-pay | Admitting: Family

## 2021-12-26 ENCOUNTER — Ambulatory Visit (INDEPENDENT_AMBULATORY_CARE_PROVIDER_SITE_OTHER)
Admission: RE | Admit: 2021-12-26 | Discharge: 2021-12-26 | Disposition: A | Discharge status: Home / Self Care | Payer: PPO | Source: Ambulatory Visit | Attending: Family | Admitting: Family

## 2021-12-26 ENCOUNTER — Other Ambulatory Visit: Payer: Self-pay

## 2021-12-26 VITALS — BP 138/64 | HR 74 | Ht 60.0 in | Wt 221.0 lb

## 2021-12-26 DIAGNOSIS — M48061 Spinal stenosis, lumbar region without neurogenic claudication: Secondary | ICD-10-CM

## 2021-12-26 DIAGNOSIS — R062 Wheezing: Secondary | ICD-10-CM | POA: Insufficient documentation

## 2021-12-26 DIAGNOSIS — D509 Iron deficiency anemia, unspecified: Secondary | ICD-10-CM

## 2021-12-26 DIAGNOSIS — J449 Chronic obstructive pulmonary disease, unspecified: Secondary | ICD-10-CM | POA: Diagnosis not present

## 2021-12-26 DIAGNOSIS — I872 Venous insufficiency (chronic) (peripheral): Secondary | ICD-10-CM

## 2021-12-26 DIAGNOSIS — F4323 Adjustment disorder with mixed anxiety and depressed mood: Secondary | ICD-10-CM

## 2021-12-26 DIAGNOSIS — K219 Gastro-esophageal reflux disease without esophagitis: Secondary | ICD-10-CM

## 2021-12-26 DIAGNOSIS — H353230 Exudative age-related macular degeneration, bilateral, stage unspecified: Secondary | ICD-10-CM | POA: Diagnosis not present

## 2021-12-26 DIAGNOSIS — J209 Acute bronchitis, unspecified: Secondary | ICD-10-CM | POA: Insufficient documentation

## 2021-12-26 DIAGNOSIS — J441 Chronic obstructive pulmonary disease with (acute) exacerbation: Secondary | ICD-10-CM | POA: Insufficient documentation

## 2021-12-26 DIAGNOSIS — E782 Mixed hyperlipidemia: Secondary | ICD-10-CM

## 2021-12-26 DIAGNOSIS — R053 Chronic cough: Secondary | ICD-10-CM

## 2021-12-26 DIAGNOSIS — I6523 Occlusion and stenosis of bilateral carotid arteries: Secondary | ICD-10-CM | POA: Diagnosis not present

## 2021-12-26 HISTORY — DX: Chronic obstructive pulmonary disease with (acute) exacerbation: J44.1

## 2021-12-26 MED ORDER — CARBOXYMETHYLCELLULOSE SODIUM 1 % OP SOLN: 1.0000 [drp] | Freq: Three times a day (TID) | OPHTHALMIC | 12 refills | Status: DC

## 2021-12-26 MED ORDER — VENLAFAXINE HCL ER 37.5 MG PO CP24: 37.5000 mg | ORAL_CAPSULE | Freq: Every day | ORAL | 1 refills | Status: DC

## 2021-12-26 NOTE — Assessment & Plan Note (Signed)
Patient to continue follow-up with orthopedist as necessary continue gabapentin

## 2021-12-26 NOTE — Assessment & Plan Note (Signed)
Continue with vascular surgeon for ongoing treatment Alicia [name]

## 2021-12-26 NOTE — Assessment & Plan Note (Signed)
Will assess CBC pending results

## 2021-12-26 NOTE — Assessment & Plan Note (Signed)
Continue f/u with vascular surgeon as scheduled. 

## 2021-12-26 NOTE — Assessment & Plan Note (Signed)
Anxiety reducing handout given to patient patient to continue venlafaxine refill sent to pharmacy.  If SI HI please notify me immediately.

## 2021-12-26 NOTE — Assessment & Plan Note (Signed)
Chest x-ray ordered today to rule out pneumonia pending

## 2021-12-26 NOTE — Assessment & Plan Note (Signed)
Cxr today pending results.

## 2021-12-26 NOTE — Progress Notes (Signed)
New Patient Office Visit  Subjective:  Patient ID: Alicia [name], female    DOB: [birth year]  Age: 76 y.o. MRN: [id number]  CC:  Chief Complaint  Patient presents with   New Patient (Initial Visit)    Establish     HPI Alicia [name] is here to establish care as a new patient.  Prior provider was: Sonny Masters, at [address]  Pt is without acute concerns.   Covid back in December 2022, was not given antibiotics, but she states she is still using nebulizer 2-3 times a day, using emergency inhaler albuterol every other day 2-3 times daily at a time. She states this is when she is short of breath. Occasional cough, that she states she has a 'smokers cough' last lab work was in October. Does feel more fatigued than usual and does have sob with doe. Has been sitting more often than not so trying to increase her stamina.   Also under a lot of stress recently, lives with her niece who has dementia (she is 79) she is a caregiver. Still taking effexor 37.5 mg once daily which she likes this. No si or hi.   chronic concerns:  Sleep apnea:   Chronic venous insufficiency: followed by vasc Dr. Donzetta Matters, also with carotid stenosis last visit 04/25/21. F/u recommended every five years prn basis  GERD: 20 mg omeprazole once daily.   Macular deg: Dr. Sherlynn Stalls at [facility], sees them every six weeks as recently changed medicaiton , has been on eye injections bil eyes since 2013 which has been helping.   Spinal stenosis:follows with ortho, Dr. Saintclair Halsted,  low back pain and bil leg radicular pain. Recent MRI lumbar spine.   Neuropathy bil lower legs and restless legs syndrome, gabapentin 300 mg TID and mirapex one tablet twice daily. This is refilled by her prior pcp, now will be me.   Sensorineural hearing loss: cerumen impaction, saw Dr. Lucia Gaskins ent with relief ,audiology testing did reveal a mild bil symmetric sensorineural hearing loss  Sleep apea: wears CPAP nightly.  Doing well. Stefani Dama does her supplies.   Past Medical History:  Diagnosis Date   Carotid artery occlusion    History of hiatal hernia    Macular degeneration    Sleep apnea     Past Surgical History:  Procedure Laterality Date   CHOLECYSTECTOMY  10/2016   EYE SURGERY Bilateral 2017   cataracts removed   LUMBAR LAMINECTOMY/DECOMPRESSION MICRODISCECTOMY Left 07/21/2021   Procedure: Laminectomy and Foraminotomy - L4-L5 - left;  Surgeon: Kary Kos, MD;  Location: [facility];  Service: Neurosurgery;  Laterality: Left;  3C   PAROTID GLAND TUMOR EXCISION Left 2003    Family History  Problem Relation Age of Onset   Heart disease Mother    Cerebral aneurysm Father     Social History   Socioeconomic History   Marital status: Widowed    Spouse name: Not on file   Number of children: Not on file   Years of education: Not on file   Highest education level: Not on file  Occupational History   Not on file  Tobacco Use   Smoking status: Every Day    Packs/day: 1.00    Years: 60.00    Pack years: 60.00    Types: Cigarettes    Passive exposure: Current   Smokeless tobacco: Never  Vaping Use   Vaping Use: Never used  Substance and Sexual Activity   Alcohol use: Not Currently  Drug use: Never   Sexual activity: Not Currently  Other Topics Concern   Not on file  Social History Narrative   Not on file   Social Determinants of Health   Financial Resource Strain: Not on file  Food Insecurity: Not on file  Transportation Needs: Not on file  Physical Activity: Not on file  Stress: Not on file  Social Connections: Not on file  Intimate Partner Violence: Not on file    Outpatient Medications Prior to Visit  Medication Sig Dispense Refill   acetaminophen (TYLENOL) 500 MG tablet Take 1,500 mg by mouth every 6 (six) hours as needed (pain.).     Aflibercept (EYLEA IO) Inject 1 Dose into the eye as directed. Every 6 weeks     albuterol (VENTOLIN HFA) 108 (90 Base) MCG/ACT inhaler  Inhale 1-2 puffs into the lungs 4 (four) times daily as needed for shortness of breath or wheezing.     aspirin EC 81 MG tablet Take 81 mg by mouth every other day. In the morning     Cholecalciferol (VITAMIN D-3) 125 MCG (5000 UT) TABS Take 5,000 Units by mouth in the morning.     [employer] TRIPLE MAGNESIUM COMPLEX PO Take by mouth.     gabapentin (NEURONTIN) 300 MG capsule TAKE 1 CAPSULE BY MOUTH THREE TIMES A DAY 270 capsule 3   Lutein 20 MG TABS Take 20 mg by mouth in the morning.     omeprazole (PRILOSEC) 20 MG capsule Take 20 mg by mouth in the morning.  1   pramipexole (MIRAPEX) 0.5 MG tablet Take 0.5 mg 2 (two) times daily by mouth.  1   carboxymethylcellul-glycerin (OPTIVE) 0.5-0.9 % ophthalmic solution Place 1-2 drops into both eyes 3 (three) times daily as needed for dry eyes.     venlafaxine XR (EFFEXOR-XR) 37.5 MG 24 hr capsule Take 37.5 mg by mouth at bedtime.     No facility-administered medications prior to visit.    Allergies  Allergen Reactions   Penicillins Other (See Comments)    Yeast infection     ROS Review of Systems  Constitutional:  Positive for fatigue. Negative for chills and fever.  HENT:  Positive for congestion and postnasal drip. Negative for facial swelling, rhinorrhea, sinus pressure and sore throat.   Respiratory:  Positive for cough (dry non productive) and shortness of breath (with doe).   Cardiovascular:  Negative for chest pain and leg swelling.  Gastrointestinal:  Negative for diarrhea and nausea.  Genitourinary:  Negative for difficulty urinating.  Neurological:  Negative for dizziness and headaches.  Psychiatric/Behavioral:  Negative for agitation and sleep disturbance.   All other systems reviewed and are negative.      Objective:    Physical Exam Vitals reviewed.  Constitutional:      General: She is not in acute distress.    Appearance: Normal appearance. She is obese. She is not ill-appearing, toxic-appearing or diaphoretic.  HENT:      Right Ear: Tympanic membrane normal.     Left Ear: Tympanic membrane normal.     Mouth/Throat:     Mouth: Mucous membranes are moist.     Pharynx: No pharyngeal swelling.     Tonsils: No tonsillar exudate.  Eyes:     Extraocular Movements: Extraocular movements intact.     Conjunctiva/sclera: Conjunctivae normal.     Pupils: Pupils are equal, round, and reactive to light.  Neck:     Thyroid: No thyroid mass.  Cardiovascular:     Rate  and Rhythm: Normal rate and regular rhythm.  Pulmonary:     Effort: Pulmonary effort is normal.     Breath sounds: Decreased air movement present. Examination of the right-upper field reveals wheezing and rhonchi. Examination of the left-upper field reveals wheezing and rhonchi. Examination of the right-middle field reveals wheezing and rhonchi. Examination of the left-middle field reveals wheezing and rhonchi. Examination of the right-lower field reveals wheezing and rhonchi. Examination of the left-lower field reveals wheezing and rhonchi. Wheezing and rhonchi present.     Comments: Expiratory wheeze all lobes Abdominal:     General: Abdomen is flat. Bowel sounds are normal.     Palpations: Abdomen is soft.  Musculoskeletal:        General: Normal range of motion.  Lymphadenopathy:     Cervical:     Right cervical: No superficial cervical adenopathy.    Left cervical: No superficial cervical adenopathy.  Skin:    General: Skin is warm.     Capillary Refill: Capillary refill takes less than 2 seconds.  Neurological:     General: No focal deficit present.     Mental Status: She is alert and oriented to person, place, and time.  Psychiatric:        Mood and Affect: Mood normal.        Behavior: Behavior normal.        Thought Content: Thought content normal.        Judgment: Judgment normal.     BP 138/64    Pulse 74    Ht 5' (1.524 m)    Wt 221 lb (100.2 kg)    SpO2 94%    BMI 43.16 kg/m  Wt Readings from Last 3 Encounters:  12/26/21 221 lb  (100.2 kg)  07/21/21 215 lb (97.5 kg)  07/10/21 221 lb (100.2 kg)     Health Maintenance Due  Topic Date Due   Pneumonia Vaccine 62+ Years old (1 - PCV) Never done   Hepatitis C Screening  Never done   TETANUS/TDAP  Never done   Zoster Vaccines- Shingrix (1 of 2) Never done   COVID-19 Vaccine (3 - Booster for Pfizer series) 04/08/2020   INFLUENZA VACCINE  Never done    There are no preventive care reminders to display for this patient.  No results found for: TSH Lab Results  Component Value Date   WBC 6.8 07/10/2021   HGB 14.8 07/10/2021   HCT 45.5 07/10/2021   MCV 91.5 07/10/2021   PLT 219 07/10/2021   Lab Results  Component Value Date   NA 138 07/10/2021   K 4.4 07/10/2021   CO2 25 07/10/2021   GLUCOSE 95 07/10/2021   BUN 9 07/10/2021   CREATININE 0.67 07/10/2021   CALCIUM 9.7 07/10/2021   ANIONGAP 8 07/10/2021   No results found for: CHOL No results found for: HDL No results found for: LDLCALC No results found for: TRIG No results found for: CHOLHDL No results found for: HGBA1C    Assessment & Plan:   Problem List Items Addressed This Visit       Cardiovascular and Mediastinum   Chronic venous insufficiency    Continue f/u with vascular surgeon as scheduled      Bilateral carotid artery stenosis - Primary    Continue with vascular surgeon for ongoing treatment /eval         Respiratory   COPD with acute exacerbation ([place])    Dg chest xray today to r/o pneumonia  Depending on results  will determine treatment plan  If pneumonia: will send antbx and likely to start pt on inhaler If not bacterial, refer to pulm for suspected worsening copd and will start pt on maint inhaler  Significant time spent with patient to exceed 55 minutes discussing COPD exacerbation as well as history and reviewing notes that patient brought along with her from her primary before        Digestive   GERD (gastroesophageal reflux disease)    Continue omeprazole. Try to  decrease and or avoid spicy foods, fried fatty foods, and also caffeine and chocolate as these can increase heartburn symptoms.           Musculoskeletal and Integument   Neural foraminal stenosis of lumbar spine    Patient to continue follow-up with orthopedist as necessary continue gabapentin        Other   Macular degeneration    Dr. Sherlynn Stalls at [facility], sees them every six weeks as recently changed medicaiton , has been on eye injections bil eyes since 2013 which has been helping.       Iron deficiency anemia    Will assess CBC pending results      Mixed hyperlipidemia   Chronic cough    Cxr today pending results.       Expiratory wheezing    Chest x-ray ordered today to rule out pneumonia pending      Relevant Orders   DG Chest 2 View   Adjustment reaction with anxiety and depression    Anxiety reducing handout given to patient patient to continue venlafaxine refill sent to pharmacy.  If SI HI please notify me immediately.      Relevant Medications   venlafaxine XR (EFFEXOR-XR) 37.5 MG 24 hr capsule    Meds ordered this encounter  Medications   carboxymethylcellulose 1 % ophthalmic solution    Sig: Apply 1 drop to eye 3 (three) times daily.    Dispense:  30 mL    Refill:  12    Order Specific Question:   Supervising Provider    Answer:   Diona Browner, AMY E [[id number]]   venlafaxine XR (EFFEXOR-XR) 37.5 MG 24 hr capsule    Sig: Take 1 capsule (37.5 mg total) by mouth at bedtime.    Dispense:  90 capsule    Refill:  1    Order Specific Question:   Supervising Provider    Answer:   Diona Browner, AMY E [[id number]]    Follow-up: Return in about 2 weeks (around 01/09/2022) for regular follow / medical cpe, come fasting.    [name], FNP

## 2021-12-26 NOTE — Assessment & Plan Note (Addendum)
Dg chest xray today to r/o pneumonia  Depending on results will determine treatment plan  If pneumonia: will send antbx and likely to start pt on inhaler If not bacterial, refer to pulm for suspected worsening copd and will start pt on maint inhaler  Significant time spent with patient to exceed 55 minutes discussing COPD exacerbation as well as history and reviewing notes that patient brought along with her from her primary before

## 2021-12-26 NOTE — Assessment & Plan Note (Signed)
Dr. Sherlynn Stalls at [facility], sees them every six weeks as recently changed medicaiton , has been on eye injections bil eyes since 2013 which has been helping.

## 2021-12-26 NOTE — Assessment & Plan Note (Signed)
Continue omeprazole. Try to decrease and or avoid spicy foods, fried fatty foods, and also caffeine and chocolate as these can increase heartburn symptoms.

## 2021-12-29 ENCOUNTER — Other Ambulatory Visit: Payer: Self-pay | Admitting: Family

## 2021-12-29 ENCOUNTER — Other Ambulatory Visit: Payer: Self-pay | Admitting: *Deleted

## 2021-12-29 DIAGNOSIS — J441 Chronic obstructive pulmonary disease with (acute) exacerbation: Secondary | ICD-10-CM

## 2021-12-29 MED ORDER — UMECLIDINIUM-VILANTEROL 62.5-25 MCG/ACT IN AEPB: 1.0000 | INHALATION_SPRAY | Freq: Every day | RESPIRATORY_TRACT | 2 refills | Status: DC

## 2021-12-29 NOTE — Progress Notes (Signed)
The chest x-ray was nonconcerning for pneumonia or bronchitis however there was COPD seen as we expected.  Considering you are having the exacerbation and using your nebulizer as well as your albuterol much more than I would like you to I am going to have you start a daily inhaler that I have sent to your pharmacy.  It is called Ellipta and I want you to do 1 puff once daily.  Please let me know if this is way too expensive and I will try another form 1 that might be covered.  Do not pay too much for this.  I am also going to refer you to pulmonary to follow-up on this as well especially with the past history of COVID.  Referral now

## 2022-01-12 ENCOUNTER — Ambulatory Visit (INDEPENDENT_AMBULATORY_CARE_PROVIDER_SITE_OTHER): Payer: PPO | Admitting: Orthopedic Surgery

## 2022-01-12 ENCOUNTER — Other Ambulatory Visit: Payer: Self-pay

## 2022-01-12 ENCOUNTER — Ambulatory Visit: Payer: PPO | Admitting: Orthopedic Surgery

## 2022-01-12 ENCOUNTER — Encounter: Payer: Self-pay | Admitting: Orthopedic Surgery

## 2022-01-12 DIAGNOSIS — M19012 Primary osteoarthritis, left shoulder: Secondary | ICD-10-CM | POA: Diagnosis not present

## 2022-01-12 DIAGNOSIS — M7551 Bursitis of right shoulder: Secondary | ICD-10-CM | POA: Diagnosis not present

## 2022-01-12 MED ORDER — LIDOCAINE HCL 1 % IJ SOLN
5.0000 mL | INTRAMUSCULAR | Status: AC | PRN
  Administered 2022-01-12: 5 mL

## 2022-01-12 MED ORDER — METHYLPREDNISOLONE ACETATE 40 MG/ML IJ SUSP
40.0000 mg | INTRAMUSCULAR | Status: AC | PRN
  Administered 2022-01-12: 40 mg via INTRA_ARTICULAR

## 2022-01-12 MED ORDER — BUPIVACAINE HCL 0.5 % IJ SOLN
9.0000 mL | INTRAMUSCULAR | Status: AC | PRN
  Administered 2022-01-12: 9 mL via INTRA_ARTICULAR

## 2022-01-12 NOTE — Progress Notes (Signed)
? ?Office Visit Note ?  ?Patient: Alicia [name]           ?Date of Birth: 1946/10/18           ?MRN: [id number] ?Visit Date: 01/12/2022 ?Requested by: No referring provider defined for this encounter. ?PCP: Pcp, No ? ?Subjective: ?Chief Complaint  ?Patient presents with  ? Right Shoulder - Pain  ? Left Shoulder - Pain  ? ? ?HPI: [name] is a 76 year old patient with bilateral shoulder pain left worse than right.  She would like to have bilateral shoulder injections today.  She has known history of left shoulder arthritis and bursitis versus rotator cuff tearing of the right shoulder.  She is doing well from her back surgery in September 2022.  She is not really ready for shoulder surgery yet.  She is right-hand dominant.  The pain wakes her from sleep at night.  Has a history of external rotation weakness in the right shoulder by review of studies.  She has not had advanced imaging on either shoulder yet.  No prior shoulder surgery. ?             ?ROS: All systems reviewed are negative as they relate to the chief complaint within the history of present illness.  Patient denies  fevers or chills. ? ? ?Assessment & Plan: ?Visit Diagnoses:  ?1. Primary osteoarthritis, left shoulder   ?2. Bursitis of right shoulder   ? ? ?Plan: Impression is bilateral shoulder pain left worse than right.  Plan is left shoulder glenohumeral joint injection right shoulder subacromial injection.  Last injections 9-22 gave her significant relief.  We will try it again today.  See how she does.  Follow-up with [place] as needed.  She is considering possible surgical intervention for the shoulders in the near future. ? ?Follow-Up Instructions: Return if symptoms worsen or fail to improve.  ? ?Orders:  ?No orders of the defined types were placed in this encounter. ? ?No orders of the defined types were placed in this encounter. ? ? ? ? Procedures: ?Large Joint Inj: R subacromial bursa on 01/12/2022 12:31 PM ?Indications: diagnostic evaluation  and pain ?Details: 18 G 1.5 in needle, posterior approach ? ?Arthrogram: No ? ?Medications: 9 mL bupivacaine 0.5 %; 40 mg methylPREDNISolone acetate 40 MG/ML; 5 mL lidocaine 1 % ?Outcome: tolerated well, no immediate complications ?Procedure, treatment alternatives, risks and benefits explained, specific risks discussed. Consent was given by the patient. Immediately prior to procedure a time out was called to verify the correct patient, procedure, equipment, support staff and site/side marked as required. Patient was prepped and draped in the usual sterile fashion.  ? ? ?Large Joint Inj: L glenohumeral on 01/12/2022 12:31 PM ?Indications: diagnostic evaluation and pain ?Details: 18 G 1.5 in needle, posterior approach ? ?Arthrogram: No ? ?Medications: 9 mL bupivacaine 0.5 %; 40 mg methylPREDNISolone acetate 40 MG/ML; 5 mL lidocaine 1 % ?Outcome: tolerated well, no immediate complications ?Procedure, treatment alternatives, risks and benefits explained, specific risks discussed. Consent was given by the patient. Immediately prior to procedure a time out was called to verify the correct patient, procedure, equipment, support staff and site/side marked as required. Patient was prepped and draped in the usual sterile fashion.  ? ? ? ? ?Clinical Data: ?No additional findings. ? ?Objective: ?Vital Signs: There were no vitals taken for this visit. ? ?Physical Exam:  ? ?Constitutional: Patient appears well-developed ?HEENT:  ?Head: Normocephalic ?Eyes:EOM are normal ?Neck: Normal range of motion ?Cardiovascular: Normal rate ?  Pulmonary/chest: Effort normal ?Neurologic: Patient is alert ?Skin: Skin is warm ?Psychiatric: Patient has normal mood and affect ? ? ?Ortho Exam: Ortho exam demonstrates full active and passive range of motion of the cervical spine.  On the right side she has range of motion of 45/90/180.  On the left she has range of motion of 25/70/120.  Rotator cuff is a little weaker with infraspinatus testing on the  right compared to the left.  She has pretty reasonable rotator cuff strength with subscap and infraspinatus testing on that left-hand side. ? ?Specialty Comments:  ?No specialty comments available. ? ?Imaging: ?No results found. ? ? ?PMFS History: ?Patient Active Problem List  ? Diagnosis Date Noted  ? Bilateral carotid artery stenosis 12/26/2021  ? Iron deficiency anemia 12/26/2021  ? Mixed hyperlipidemia 12/26/2021  ? Chronic cough 12/26/2021  ? COPD with acute exacerbation ([place]) 12/26/2021  ? Expiratory wheezing 12/26/2021  ? Adjustment reaction with anxiety and depression 12/26/2021  ? Chronic venous insufficiency 02/29/2020  ? 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/05/2019  ? GERD (gastroesophageal reflux disease) 06/08/2018  ? Macular degeneration 06/08/2018  ? Sleep apnea 06/08/2018  ? ?Past Medical History:  ?Diagnosis Date  ? Carotid artery occlusion   ? History of hiatal hernia   ? Macular degeneration   ? Sleep apnea   ?  ?Family History  ?Problem Relation Age of Onset  ? Heart disease Mother   ? Cerebral aneurysm Father   ?  ?Past Surgical History:  ?Procedure Laterality Date  ? CHOLECYSTECTOMY  10/2016  ? EYE SURGERY Bilateral 2017  ? cataracts removed  ? LUMBAR LAMINECTOMY/DECOMPRESSION MICRODISCECTOMY Left 07/21/2021  ? Procedure: Laminectomy and Foraminotomy - L4-L5 - left;  Surgeon: Kary Kos, MD;  Location: [facility];  Service: Neurosurgery;  Laterality: Left;  3C  ? PAROTID GLAND TUMOR EXCISION Left 2003  ? ?Social History  ? ?Occupational History  ? Not on file  ?Tobacco Use  ? Smoking status: Every Day  ?  Packs/day: 1.00  ?  Years: 60.00  ?  Pack years: 60.00  ?  Types: Cigarettes  ?  Passive exposure: Current  ? Smokeless tobacco: Never  ?Vaping Use  ? Vaping Use: Never used  ?Substance and Sexual Activity  ? Alcohol use: Not Currently  ? Drug use: Never  ? Sexual activity: Not Currently  ? ? ? ? ? ?

## 2022-01-13 ENCOUNTER — Other Ambulatory Visit: Payer: Self-pay | Admitting: Family

## 2022-01-13 ENCOUNTER — Other Ambulatory Visit (INDEPENDENT_AMBULATORY_CARE_PROVIDER_SITE_OTHER): Payer: PPO

## 2022-01-13 ENCOUNTER — Encounter: Payer: PPO | Admitting: Family

## 2022-01-13 DIAGNOSIS — R053 Chronic cough: Secondary | ICD-10-CM

## 2022-01-13 DIAGNOSIS — D509 Iron deficiency anemia, unspecified: Secondary | ICD-10-CM

## 2022-01-13 DIAGNOSIS — E782 Mixed hyperlipidemia: Secondary | ICD-10-CM

## 2022-01-13 DIAGNOSIS — D649 Anemia, unspecified: Secondary | ICD-10-CM | POA: Diagnosis not present

## 2022-01-13 LAB — LIPID PANEL
Cholesterol: 168 mg/dL (ref 0–200)
HDL: 59.2 mg/dL (ref >39.00)
LDL Cholesterol: 97 mg/dL (ref 0–99)
NonHDL: 108.79
Total CHOL/HDL Ratio: 3
Triglycerides: 59 mg/dL (ref 0.0–149.0)
VLDL: 11.8 mg/dL (ref 0.0–40.0)

## 2022-01-13 LAB — CBC WITH DIFFERENTIAL/PLATELET
Basophils Absolute: 0.1 10*3/uL (ref 0.0–0.1)
Basophils Relative: 0.7 % (ref 0.0–3.0)
Eosinophils Absolute: 0 10*3/uL (ref 0.0–0.7)
Eosinophils Relative: 0.5 % (ref 0.0–5.0)
HCT: 26.8 % — ABNORMAL LOW (ref 36.0–46.0)
Hemoglobin: 8.1 g/dL — ABNORMAL LOW (ref 12.0–15.0)
Lymphocytes Relative: 13.9 % (ref 12.0–46.0)
Lymphs Abs: 1.2 10*3/uL (ref 0.7–4.0)
MCHC: 30.2 g/dL (ref 30.0–36.0)
MCV: 71.8 fl — ABNORMAL LOW (ref 78.0–100.0)
Monocytes Absolute: 0.6 10*3/uL (ref 0.1–1.0)
Monocytes Relative: 6.6 % (ref 3.0–12.0)
Neutro Abs: 6.7 10*3/uL (ref 1.4–7.7)
Neutrophils Relative %: 78.3 % — ABNORMAL HIGH (ref 43.0–77.0)
Platelets: 221 10*3/uL (ref 150.0–400.0)
RBC: 3.74 Mil/uL — ABNORMAL LOW (ref 3.87–5.11)
RDW: 17 % — ABNORMAL HIGH (ref 11.5–15.5)
WBC: 8.5 10*3/uL (ref 4.0–10.5)

## 2022-01-13 LAB — COMPREHENSIVE METABOLIC PANEL
ALT: 10 U/L (ref 0–35)
AST: 17 U/L (ref 0–37)
Albumin: 4.4 g/dL (ref 3.5–5.2)
Alkaline Phosphatase: 112 U/L (ref 39–117)
BUN: 14 mg/dL (ref 6–23)
CO2: 25 mEq/L (ref 19–32)
Calcium: 9.9 mg/dL (ref 8.4–10.5)
Chloride: 102 mEq/L (ref 96–112)
Creatinine, Ser: 0.73 mg/dL (ref 0.40–1.20)
GFR: 79.97 mL/min (ref >60.00)
Glucose, Bld: 93 mg/dL (ref 70–99)
Potassium: 3.9 mEq/L (ref 3.5–5.1)
Sodium: 135 mEq/L (ref 135–145)
Total Bilirubin: 0.4 mg/dL (ref 0.2–1.2)
Total Protein: 8.2 g/dL (ref 6.0–8.3)

## 2022-01-13 LAB — IBC + FERRITIN
Ferritin: 4.8 ng/mL — ABNORMAL LOW (ref 10.0–291.0)
Iron: 16 ug/dL — ABNORMAL LOW (ref 42–145)
Saturation Ratios: 2.7 % — ABNORMAL LOW (ref 20.0–50.0)
TIBC: 593.6 ug/dL — ABNORMAL HIGH (ref 250.0–450.0)
Transferrin: 424 mg/dL — ABNORMAL HIGH (ref 212.0–360.0)

## 2022-01-13 NOTE — Progress Notes (Signed)
Anyway we can add IBC ferritin (this include total iron, ferritin and transferrin right?)

## 2022-01-13 NOTE — Progress Notes (Signed)
Attempted to call pt but call would not call out from my personal phone.  ?Can you please call pt,  ?Significant drop in blood levels, anemia. Pt did have surgery in 9/22 which can account for some blood loss but this is much more than expected. Any blood in stool?? I do remember pt with increasing sob / DOE and feeling overly tired. I am referring her to hematology for possible iron infusion, and or see why pt is losing blood. Also I have ordered an FOBt, pt to pick up from lab and complete as soon as able to see if losing blood from GI tract.

## 2022-01-14 ENCOUNTER — Other Ambulatory Visit: Payer: Self-pay | Admitting: Family

## 2022-01-14 DIAGNOSIS — D649 Anemia, unspecified: Secondary | ICD-10-CM

## 2022-01-15 ENCOUNTER — Inpatient Hospital Stay: Payer: PPO | Attending: Oncology | Admitting: Oncology

## 2022-01-15 ENCOUNTER — Other Ambulatory Visit: Payer: Self-pay

## 2022-01-15 ENCOUNTER — Encounter: Payer: Self-pay | Admitting: Oncology

## 2022-01-15 ENCOUNTER — Inpatient Hospital Stay: Payer: PPO

## 2022-01-15 ENCOUNTER — Other Ambulatory Visit (INDEPENDENT_AMBULATORY_CARE_PROVIDER_SITE_OTHER): Payer: PPO

## 2022-01-15 VITALS — BP 125/66 | HR 93 | Temp 97.2°F | Resp 16 | Ht 60.0 in | Wt 219.2 lb

## 2022-01-15 DIAGNOSIS — F17210 Nicotine dependence, cigarettes, uncomplicated: Secondary | ICD-10-CM | POA: Insufficient documentation

## 2022-01-15 DIAGNOSIS — D509 Iron deficiency anemia, unspecified: Secondary | ICD-10-CM | POA: Insufficient documentation

## 2022-01-15 NOTE — Progress Notes (Signed)
?Alicia [name]  ?[phone] [phone] ? ?[name] OB: [birth year]  MR#: [id number]  GOT#:[id number] ? ?Patient Care Team: ?Alicia Pancoast, FNP as PCP - General (Family Medicine) ? ?CHIEF COMPLAINT: Iron deficiency anemia. ? ?INTERVAL HISTORY: Patient is a 76 year old female who was noted to have significantly reduced hemoglobin and iron stores on routine blood work.  She reports a normal colonoscopy and EGD less than 2 years ago at an outside facility.  She admits a change in her diet since moving in with her niece several years ago.  She has increased weakness and fatigue.  Has no neurologic complaints.  She denies any recent fevers or illnesses.  She has a good appetite and denies weight loss.  She has no chest [name], shortness of breath, cough, or hemoptysis.  She denies any nausea, vomiting, or constipation.  But does admit to periodic diarrhea.  She has no melena or hematochezia.  She has no urinary complaints.  Patient offers no further specific complaints today. ? ?REVIEW OF SYSTEMS:   ?Review of Systems  ?Constitutional:  Positive for malaise/fatigue. Negative for fever and weight loss.  ?Respiratory: Negative.  Negative for cough, hemoptysis and shortness of breath.   ?Cardiovascular: Negative.  Negative for chest [name] and leg swelling.  ?Gastrointestinal:  Positive for diarrhea. Negative for abdominal [name], blood in stool and melena.  ?Genitourinary: Negative.  Negative for hematuria.  ?Musculoskeletal: Negative.  Negative for back [name].  ?Skin: Negative.  Negative for rash.  ?Neurological: Negative.  Negative for dizziness, focal weakness, weakness and headaches.  ? ?As per HPI. Otherwise, a complete review of systems is negative. ? ?PAST MEDICAL HISTORY: ?Past Medical History:  ?Diagnosis Date  ? Carotid artery occlusion   ? History of hiatal hernia   ? Macular degeneration   ? Sleep apnea   ? ? ?PAST SURGICAL HISTORY: ?Past Surgical History:   ?Procedure Laterality Date  ? CHOLECYSTECTOMY  10/2016  ? EYE SURGERY Bilateral 2017  ? cataracts removed  ? LUMBAR LAMINECTOMY/DECOMPRESSION MICRODISCECTOMY Left 07/21/2021  ? Procedure: Laminectomy and Foraminotomy - L4-L5 - left;  Surgeon: Alicia Kos, MD;  Location: [place];  Service: Neurosurgery;  Laterality: Left;  3C  ? PAROTID GLAND TUMOR EXCISION Left 2003  ? ? ?FAMILY HISTORY: ?Family History  ?Problem Relation Age of Onset  ? Heart disease Mother   ? Cerebral aneurysm Father   ? ? ?ADVANCED DIRECTIVES (Y/N):  N ? ?HEALTH MAINTENANCE: ?Social History  ? ?Tobacco Use  ? Smoking status: Every Day  ?  Packs/day: 1.00  ?  Years: 60.00  ?  Pack years: 60.00  ?  Types: Cigarettes  ?  Passive exposure: Current  ? Smokeless tobacco: Never  ?Vaping Use  ? Vaping Use: Never used  ?Substance Use Topics  ? Alcohol use: Not Currently  ? Drug use: Never  ? ? ? Colonoscopy: ? PAP: ? Bone density: ? Lipid panel: ? ?Allergies  ?Allergen Reactions  ? Penicillins Other (See Comments)  ?  Yeast infection ?  ? ? ?Current Outpatient Medications  ?Medication Sig Dispense Refill  ? acetaminophen (TYLENOL) 500 MG tablet Take 1,500 mg by mouth every 6 (six) hours as needed ([name].).    ? Aflibercept (EYLEA IO) Inject 1 Dose into the eye as directed. Every 6 weeks    ? albuterol (VENTOLIN HFA) 108 (90 Base) MCG/ACT inhaler Inhale 1-2 puffs into the lungs 4 (four) times daily as needed for shortness of breath or wheezing.    ?  aspirin EC 81 MG tablet Take 81 mg by mouth every other day. In the morning    ? Cholecalciferol (VITAMIN D-3) 125 MCG (5000 UT) TABS Take 5,000 Units by mouth in the morning.    ? [employer] TRIPLE MAGNESIUM COMPLEX PO Take by mouth.    ? gabapentin (NEURONTIN) 300 MG capsule TAKE 1 CAPSULE BY MOUTH THREE TIMES A DAY 270 capsule 3  ? Lutein 20 MG TABS Take 20 mg by mouth in the morning.    ? omeprazole (PRILOSEC) 20 MG capsule Take 20 mg by mouth in the morning.  1  ? pramipexole (MIRAPEX) 0.5 MG tablet Take 0.5 mg 2  (two) times daily by mouth.  1  ? triamcinolone (KENALOG) 0.025 % cream Apply 1 application. topically daily as needed.    ? triamcinolone cream (KENALOG) 0.1 % Apply 1 application. topically 2 (two) times daily.    ? venlafaxine XR (EFFEXOR-XR) 37.5 MG 24 hr capsule Take 1 capsule (37.5 mg total) by mouth at bedtime. 90 capsule 1  ? carboxymethylcellulose 1 % ophthalmic solution Apply 1 drop to eye 3 (three) times daily. (Patient not taking: Reported on 01/15/2022) 30 mL 12  ? umeclidinium-vilanterol (ANORO ELLIPTA) 62.5-25 MCG/ACT AEPB Inhale 1 puff into the lungs daily at 6 (six) AM. (Patient not taking: Reported on 01/15/2022) 1 each 2  ? ?No current facility-administered medications for this visit.  ? ? ?OBJECTIVE: ?Vitals:  ? 01/15/22 1344  ?BP: 125/66  ?Pulse: 93  ?Resp: 16  ?Temp: (!) 97.2 ?F (36.2 ?C)  ?SpO2: 99%  ?   Body mass index is 42.81 kg/m?Marland Kitchen    ECOG FS:1 - Symptomatic but completely ambulatory ? ?General: Well-developed, well-nourished, no acute distress. ?Eyes: Pink conjunctiva, anicteric sclera. ?HEENT: Normocephalic, moist mucous membranes. ?Lungs: No audible wheezing or coughing. ?Heart: Regular rate and rhythm. ?Abdomen: Soft, nontender, no obvious distention. ?Musculoskeletal: No edema, cyanosis, or clubbing. ?Neuro: Alert, answering all questions appropriately. Cranial nerves grossly intact. ?Skin: No rashes or petechiae noted. ?Psych: Normal affect. ?Lymphatics: No cervical, calvicular, axillary or inguinal LAD. ? ? ?LAB RESULTS: ? ?Lab Results  ?Component Value Date  ? NA 135 01/13/2022  ? K 3.9 01/13/2022  ? CL 102 01/13/2022  ? CO2 25 01/13/2022  ? GLUCOSE 93 01/13/2022  ? BUN 14 01/13/2022  ? CREATININE 0.73 01/13/2022  ? CALCIUM 9.9 01/13/2022  ? PROT 8.2 01/13/2022  ? ALBUMIN 4.4 01/13/2022  ? AST 17 01/13/2022  ? ALT 10 01/13/2022  ? ALKPHOS 112 01/13/2022  ? BILITOT 0.4 01/13/2022  ? GFRNONAA >60 07/10/2021  ? ? ?Lab Results  ?Component Value Date  ? WBC 8.5 01/13/2022  ? NEUTROABS 6.7  01/13/2022  ? HGB 8.1 (L) 01/13/2022  ? HCT 26.8 (L) 01/13/2022  ? MCV 71.8 (L) 01/13/2022  ? PLT 221.0 01/13/2022  ? ?Lab Results  ?Component Value Date  ? IRON 16 (L) 01/13/2022  ? TIBC 593.6 (H) 01/13/2022  ? IRONPCTSAT 2.7 (L) 01/13/2022  ? ?Lab Results  ?Component Value Date  ? FERRITIN 4.8 (L) 01/13/2022  ? ? ? ?STUDIES: ?DG Chest 2 View ? ?Result Date: 12/27/2021 ?CLINICAL DATA:  Shortness of breath and wheezing for several weeks, initial encounter EXAM: CHEST - 2 VIEW COMPARISON:  10/18/2021 FINDINGS: Cardiac shadow is within normal limits. Aortic calcifications are noted. The lungs are hyper aerated bilaterally with mild interstitial change. No focal infiltrate or sizable effusion no nodules are not well appreciated on this exam. No bony abnormality is seen. IMPRESSION: COPD  without acute abnormality. Electronically Signed   By: Inez Catalina M.D.   On: 12/27/2021 21:26   ? ?ASSESSMENT: Iron deficiency anemia. ? ?PLAN:   ? ?1.  Iron deficiency anemia: Possibly secondary to decreased absorption from diarrhea as well as change in diet over the last several years.  Patient has a significantly reduced hemoglobin and iron stores and is also symptomatic.  Benefit from IV Venofer.  Return to clinic 5 times over the next several weeks to receive 200 mg IV Venofer.  Patient will then return to clinic in 3 months with repeat laboratory work, further evaluation, and continuation of treatment. ? ?I spent a total of 45 minutes reviewing chart data, face-to-face evaluation with the patient, counseling and coordination of care as detailed above. ? ? ?Patient expressed understanding and was in agreement with this plan. She also understands that She can call clinic at any time with any questions, concerns, or complaints.  ? ? ?Lloyd Huger, MD   01/15/2022 3:08 PM ? ? ? ? ?

## 2022-01-15 NOTE — Progress Notes (Signed)
Pt reports weakness, nausea but denies vomiting, chronic diarrhea with burning sensation. Pt reports difficulty sleeping but has sleep apnea. Pt has chronic cough and mild shortness of breath but states she is "borderline COPD" and current pack/day smoker. Also c/o chronic b/l foot numbness and tingling in hands. Currently taking gabapentin for same. ?

## 2022-01-19 ENCOUNTER — Encounter: Payer: Self-pay | Admitting: Family

## 2022-01-19 ENCOUNTER — Other Ambulatory Visit: Payer: Self-pay

## 2022-01-19 ENCOUNTER — Telehealth: Payer: Self-pay | Admitting: *Deleted

## 2022-01-19 ENCOUNTER — Encounter: Payer: PPO | Admitting: Family

## 2022-01-19 ENCOUNTER — Ambulatory Visit (INDEPENDENT_AMBULATORY_CARE_PROVIDER_SITE_OTHER): Payer: PPO | Admitting: Family

## 2022-01-19 VITALS — BP 140/82 | HR 98 | Ht 61.0 in | Wt 214.0 lb

## 2022-01-19 DIAGNOSIS — R195 Other fecal abnormalities: Secondary | ICD-10-CM

## 2022-01-19 DIAGNOSIS — K219 Gastro-esophageal reflux disease without esophagitis: Secondary | ICD-10-CM

## 2022-01-19 DIAGNOSIS — D509 Iron deficiency anemia, unspecified: Secondary | ICD-10-CM | POA: Diagnosis not present

## 2022-01-19 DIAGNOSIS — M25562 Pain in left knee: Secondary | ICD-10-CM | POA: Insufficient documentation

## 2022-01-19 DIAGNOSIS — L989 Disorder of the skin and subcutaneous tissue, unspecified: Secondary | ICD-10-CM | POA: Diagnosis not present

## 2022-01-19 DIAGNOSIS — R197 Diarrhea, unspecified: Secondary | ICD-10-CM | POA: Diagnosis not present

## 2022-01-19 DIAGNOSIS — R3915 Urgency of urination: Secondary | ICD-10-CM | POA: Insufficient documentation

## 2022-01-19 DIAGNOSIS — R1013 Epigastric pain: Secondary | ICD-10-CM | POA: Insufficient documentation

## 2022-01-19 DIAGNOSIS — R5383 Other fatigue: Secondary | ICD-10-CM | POA: Insufficient documentation

## 2022-01-19 DIAGNOSIS — R911 Solitary pulmonary nodule: Secondary | ICD-10-CM

## 2022-01-19 DIAGNOSIS — R946 Abnormal results of thyroid function studies: Secondary | ICD-10-CM

## 2022-01-19 HISTORY — DX: Other fecal abnormalities: R19.5

## 2022-01-19 LAB — FECAL OCCULT BLOOD, IMMUNOCHEMICAL: Fecal Occult Bld: POSITIVE — AB

## 2022-01-19 MED ORDER — PANTOPRAZOLE SODIUM 40 MG PO TBEC: 40.0000 mg | DELAYED_RELEASE_TABLET | Freq: Every day | ORAL | 2 refills | Status: DC

## 2022-01-19 NOTE — Progress Notes (Signed)
Established Patient Office Visit  Subjective:  Patient ID: Alicia [name], female    DOB: 20-Jan-1946  Age: 76 y.o. MRN: [id number]  CC: No chief complaint on file.   HPI Alicia [name] is here today for follow up.   New finding of anemia, referred to hematologist. Saw Dr. Orlie Dakin who recommended Iv venofer 200 mg, wants to administer x 5 times. Is symptomatic with DOE and fatigue. No chest pain.   Recent positive FOBT as well. Referral to GI in place. She does report she does have some diarrhea at time, on occasion will see blood in the stool but typically just assumes it is her hemorrhoids. She does state she had a colonscopy and EGD June 27th, 2022, we do not currently have records, however pt states ever since she has noticed increased diarrhea. Abdominal cramping at times, when she eats starts to hurt epigastric area if she lies down for an hour it will improve. Still taking prilosec once daily. Does find she is having to take more tums more than often. Does have a past dx of IBS.  Lab Results  Component Value Date   IRON 16 (L) 01/13/2022   TIBC 593.6 (H) 01/13/2022   FERRITIN 4.8 (L) 01/13/2022   Lab Results  Component Value Date   WBC 8.5 01/13/2022   HGB 8.1 (L) 01/13/2022   HCT 26.8 (L) 01/13/2022   MCV 71.8 (L) 01/13/2022   PLT 221.0 01/13/2022    Still having problems sleeping, using her cpap and also with COPD.  Did review her CT back from 10/18/21 with suspicious 8.4 mm pulmonary nodule recommended 3 month f/u, pt overdue for this. Reviewed cxr I ordered 2/17 which was with no acute abn, COPD again noted.  Past Medical History:  Diagnosis Date   Carotid artery occlusion    History of hiatal hernia    Macular degeneration    Sleep apnea     Past Surgical History:  Procedure Laterality Date   CHOLECYSTECTOMY  10/2016   EYE SURGERY Bilateral 2017   cataracts removed   LUMBAR LAMINECTOMY/DECOMPRESSION MICRODISCECTOMY Left 07/21/2021   Procedure:  Laminectomy and Foraminotomy - L4-L5 - left;  Surgeon: Donalee Citrin, MD;  Location: [facility] OR;  Service: Neurosurgery;  Laterality: Left;  3C   PAROTID GLAND TUMOR EXCISION Left 2003    Family History  Problem Relation Age of Onset   Heart disease Mother    Cerebral aneurysm Father     Social History   Socioeconomic History   Marital status: Widowed    Spouse name: Not on file   Number of children: Not on file   Years of education: Not on file   Highest education level: Not on file  Occupational History   Not on file  Tobacco Use   Smoking status: Every Day    Packs/day: 1.00    Years: 60.00    Pack years: 60.00    Types: Cigarettes    Passive exposure: Current   Smokeless tobacco: Never  Vaping Use   Vaping Use: Never used  Substance and Sexual Activity   Alcohol use: Not Currently   Drug use: Never   Sexual activity: Not Currently  Other Topics Concern   Not on file  Social History Narrative   Not on file   Social Determinants of Health   Financial Resource Strain: Not on file  Food Insecurity: Not on file  Transportation Needs: Not on file  Physical Activity: Not on file  Stress: Not  on file  Social Connections: Not on file  Intimate Partner Violence: Not on file    Outpatient Medications Prior to Visit  Medication Sig Dispense Refill   acetaminophen (TYLENOL) 500 MG tablet Take 1,500 mg by mouth every 6 (six) hours as needed (pain.).     Aflibercept (EYLEA IO) Inject 1 Dose into the eye as directed. Every 6 weeks     albuterol (VENTOLIN HFA) 108 (90 Base) MCG/ACT inhaler Inhale 1-2 puffs into the lungs 4 (four) times daily as needed for shortness of breath or wheezing.     aspirin EC 81 MG tablet Take 81 mg by mouth every other day. In the morning     Cholecalciferol (VITAMIN D-3) 125 MCG (5000 UT) TABS Take 5,000 Units by mouth in the morning.     [employer] TRIPLE MAGNESIUM COMPLEX PO Take by mouth.     gabapentin (NEURONTIN) 300 MG capsule TAKE 1 CAPSULE BY MOUTH  THREE TIMES A DAY 270 capsule 3   Lutein 20 MG TABS Take 20 mg by mouth in the morning.     pramipexole (MIRAPEX) 0.5 MG tablet Take 0.5 mg 2 (two) times daily by mouth.  1   triamcinolone (KENALOG) 0.025 % cream Apply 1 application. topically daily as needed.     triamcinolone cream (KENALOG) 0.1 % Apply 1 application. topically 2 (two) times daily.     venlafaxine XR (EFFEXOR-XR) 37.5 MG 24 hr capsule Take 1 capsule (37.5 mg total) by mouth at bedtime. 90 capsule 1   omeprazole (PRILOSEC) 20 MG capsule Take 20 mg by mouth in the morning.  1   umeclidinium-vilanterol (ANORO ELLIPTA) 62.5-25 MCG/ACT AEPB Inhale 1 puff into the lungs daily at 6 (six) AM. (Patient not taking: Reported on 01/15/2022) 1 each 2   carboxymethylcellulose 1 % ophthalmic solution Apply 1 drop to eye 3 (three) times daily. (Patient not taking: Reported on 01/15/2022) 30 mL 12   No facility-administered medications prior to visit.    Allergies  Allergen Reactions   Penicillins Other (See Comments)    Yeast infection     ROS Review of Systems  Constitutional:  Negative for chills and fatigue.  Respiratory:  Positive for shortness of breath (with doe) and wheezing. Negative for cough.   Cardiovascular:  Negative for chest pain, palpitations and leg swelling.  Gastrointestinal:  Positive for abdominal pain (cramping at times not at current) and blood in stool. Negative for constipation, diarrhea, nausea and vomiting.       Increased heartburn at times  Genitourinary:  Negative for difficulty urinating.  Psychiatric/Behavioral:  Negative for agitation and sleep disturbance.   All other systems reviewed and are negative.     Objective:    Physical Exam Vitals reviewed.  Constitutional:      General: She is not in acute distress.    Appearance: Normal appearance. She is not ill-appearing or toxic-appearing.  HENT:     Right Ear: Tympanic membrane normal.     Left Ear: Tympanic membrane normal.     Mouth/Throat:      Mouth: Mucous membranes are moist.     Pharynx: No pharyngeal swelling.     Tonsils: No tonsillar exudate.  Eyes:     Extraocular Movements: Extraocular movements intact.     Conjunctiva/sclera: Conjunctivae normal.     Pupils: Pupils are equal, round, and reactive to light.  Neck:     Thyroid: No thyroid mass.  Cardiovascular:     Rate and Rhythm: Normal rate and regular  rhythm.  Pulmonary:     Effort: Pulmonary effort is normal.     Breath sounds: No decreased air movement. Examination of the right-upper field reveals wheezing. Examination of the left-upper field reveals wheezing. Examination of the left-middle field reveals wheezing. Wheezing present. No rhonchi or rales.  Abdominal:     General: Abdomen is flat. Bowel sounds are normal.     Palpations: Abdomen is soft. There is no mass.     Tenderness: There is abdominal tenderness (epigastric tenderness on palpation, ruq tenderness on palpation). There is no guarding or rebound.     Hernia: No hernia is present.  Musculoskeletal:        General: Normal range of motion.  Lymphadenopathy:     Cervical:     Right cervical: No superficial cervical adenopathy.    Left cervical: No superficial cervical adenopathy.  Skin:    General: Skin is warm.     Capillary Refill: Capillary refill takes less than 2 seconds.     Comments: Small circular red lesion with some scaling on surface, see image   Neurological:     General: No focal deficit present.     Mental Status: She is alert and oriented to person, place, and time.  Psychiatric:        Mood and Affect: Mood normal.        Behavior: Behavior normal.        Thought Content: Thought content normal.        Judgment: Judgment normal.     BP 140/82   Pulse 98   Ht 5\' 1"  (1.549 m)   Wt 214 lb (97.1 kg)   SpO2 99%   BMI 40.43 kg/m  Wt Readings from Last 3 Encounters:  01/19/22 214 lb (97.1 kg)  01/15/22 219 lb 3.2 oz (99.4 kg)  12/26/21 221 lb (100.2 kg)     Health  Maintenance Due  Topic Date Due   Pneumonia Vaccine 66+ Years old (1 - PCV) Never done   Hepatitis C Screening  Never done   TETANUS/TDAP  Never done   Zoster Vaccines- Shingrix (1 of 2) Never done   COVID-19 Vaccine (3 - Booster for Pfizer series) 04/08/2020    There are no preventive care reminders to display for this patient.  No results found for: TSH Lab Results  Component Value Date   WBC 8.5 01/13/2022   HGB 8.1 (L) 01/13/2022   HCT 26.8 (L) 01/13/2022   MCV 71.8 (L) 01/13/2022   PLT 221.0 01/13/2022   Lab Results  Component Value Date   NA 135 01/13/2022   K 3.9 01/13/2022   CO2 25 01/13/2022   GLUCOSE 93 01/13/2022   BUN 14 01/13/2022   CREATININE 0.73 01/13/2022   BILITOT 0.4 01/13/2022   ALKPHOS 112 01/13/2022   AST 17 01/13/2022   ALT 10 01/13/2022   PROT 8.2 01/13/2022   ALBUMIN 4.4 01/13/2022   CALCIUM 9.9 01/13/2022   ANIONGAP 8 07/10/2021   GFR 79.97 01/13/2022   Lab Results  Component Value Date   CHOL 168 01/13/2022   Lab Results  Component Value Date   HDL 59.20 01/13/2022   Lab Results  Component Value Date   LDLCALC 97 01/13/2022   Lab Results  Component Value Date   TRIG 59.0 01/13/2022   Lab Results  Component Value Date   CHOLHDL 3 01/13/2022   No results found for: HGBA1C    Assessment & Plan:   Problem List Items Addressed This  Visit       Digestive   GERD (gastroesophageal reflux disease)    Sending higher dose pantoprazole for possible ulcer as increased heartburn, + fobt Pt to start and take daily.  Try to decrease and or avoid spicy foods, fried fatty foods, and also caffeine and chocolate as these can increase heartburn symptoms.        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 However positive FOBT  Refer to gi to r/o GI bleed or other cause      Relevant Orders   Ambulatory referral to Gastroenterology   Positive fecal occult blood test - Primary   Relevant Orders   Ambulatory referral to Gastroenterology   Epigastric pain    Ordering alpha gal and h pylori  Pantoprazole rx given to pt       Relevant Orders   Alpha-Gal Panel   H. 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 Ordered venous doppler u/s pending  Not suspected dvt      Relevant Orders   US Venous Img Lower Unilateral Left (DVT)   Urinary urgency   Relevant Orders   Urine Culture (Completed)   Other fatigue    Likely r/t anemia       Incidental pulmonary nodule, greater than or equal to 8mm   Relevant Orders   CT CHEST LCS NODULE F/U LOW DOSE WO CONTRAST   Abnormal thyroid function test    Thyroid panel ordered Pending results      Relevant Orders   Thyroid Peroxidase Antibodies (TPO) (REFL)   T3, free   T4, free   TSH    Meds ordered this encounter  Medications   pantoprazole (PROTONIX) 40 MG tablet    Sig: Take 1 tablet (40 mg total) by mouth daily.    Dispense:  30 tablet    Refill:  2    Order Specific Question:   Supervising Provider    Answer:   Ermalene Searing, AMY E [[id number]]    Follow-up: Return in about 1 month (around 02/19/2022).    Mort Sawyers, FNP

## 2022-01-19 NOTE — Telephone Encounter (Signed)
Alicia [name] with [facility] called with Critical labs. ? ?Pt has positive + Ifob ? ?Results givien to PCP ?

## 2022-01-19 NOTE — Progress Notes (Signed)
D/w pt  ?Urgent referral in place for GI consult ?Pt seeing hematology as well

## 2022-01-19 NOTE — Patient Instructions (Addendum)
Leave a urine before you leave.  ?I will order labs at one month follow up after you have had iron replacement.  ? ?I have ordered a cat scan of your lungs for 3 month follow up on your nodule that was found. Please call [facility] imaging and get scheduled. ? ?I have ordered imaging for you at [facility] outpatient diagnostic center for your left lower leg, suspected bakers cyst left knee. This order has been sent over for you electronically.  ?Please call [phone] to schedule this appointment. ? ?It was a pleasure seeing you today! Please do not hesitate to reach out with any questions and or concerns. ? ?Regards,  ? ?Yuri Flener ?FNP-C ? ? ?

## 2022-01-20 LAB — URINE CULTURE
MICRO NUMBER:: 13122402
Result:: NO GROWTH
SPECIMEN QUALITY:: ADEQUATE

## 2022-01-21 ENCOUNTER — Telehealth: Payer: Self-pay | Admitting: Family

## 2022-01-21 ENCOUNTER — Other Ambulatory Visit: Payer: Self-pay

## 2022-01-21 ENCOUNTER — Inpatient Hospital Stay: Payer: PPO

## 2022-01-21 VITALS — BP 163/73 | HR 80 | Temp 98.0°F | Resp 19

## 2022-01-21 DIAGNOSIS — D509 Iron deficiency anemia, unspecified: Secondary | ICD-10-CM | POA: Diagnosis not present

## 2022-01-21 DIAGNOSIS — R946 Abnormal results of thyroid function studies: Secondary | ICD-10-CM | POA: Insufficient documentation

## 2022-01-21 DIAGNOSIS — R911 Solitary pulmonary nodule: Secondary | ICD-10-CM | POA: Insufficient documentation

## 2022-01-21 MED ORDER — SODIUM CHLORIDE 0.9 % IV SOLN
Freq: Once | INTRAVENOUS | Status: AC
  Filled 2022-01-21: qty 250

## 2022-01-21 MED ORDER — SODIUM CHLORIDE 0.9 % IV SOLN: 200.0000 mg | Freq: Once | INTRAVENOUS | Status: DC

## 2022-01-21 MED ORDER — IRON SUCROSE 20 MG/ML IV SOLN
200.0000 mg | Freq: Once | INTRAVENOUS | Status: AC
  Administered 2022-01-21: 200 mg via INTRAVENOUS
  Filled 2022-01-21: qty 10

## 2022-01-21 NOTE — Assessment & Plan Note (Signed)
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

## 2022-01-21 NOTE — Telephone Encounter (Signed)
LVM--regarding Dermatology referral  and can make an appt to  ([facility]) [phone] ?

## 2022-01-21 NOTE — Assessment & Plan Note (Signed)
Ordering alpha gal and h pylori  ?Pantoprazole rx given to pt  ?

## 2022-01-21 NOTE — Assessment & Plan Note (Signed)
Thyroid panel ordered ?Pending results ?

## 2022-01-21 NOTE — Assessment & Plan Note (Signed)
Pt already seeing hematology for venofer infusions. ?However positive FOBT  ?Refer to gi to r/o GI bleed or other cause ?

## 2022-01-21 NOTE — Assessment & Plan Note (Signed)
Sending higher dose pantoprazole for possible ulcer as increased heartburn, + fobt ?Pt to start and take daily.  ?Try to decrease and or avoid spicy foods, fried fatty foods, and also caffeine and chocolate as these can increase heartburn symptoms.  ? ?

## 2022-01-21 NOTE — Assessment & Plan Note (Signed)
Likely r/t anemia  ?

## 2022-01-21 NOTE — Assessment & Plan Note (Signed)
Bland diet, immodium prn  ?Pt to see GI as well ?

## 2022-01-21 NOTE — Assessment & Plan Note (Signed)
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

## 2022-01-21 NOTE — Telephone Encounter (Signed)
Pt called stating that tabitha put in an order for a dermatologist. Pt is asking where and who and do she call and make the appt or is Tabitha going to call and make it. Please advise. ?

## 2022-01-21 NOTE — Patient Instructions (Signed)

## 2022-01-23 ENCOUNTER — Other Ambulatory Visit: Payer: Self-pay

## 2022-01-23 ENCOUNTER — Inpatient Hospital Stay: Payer: PPO

## 2022-01-23 ENCOUNTER — Telehealth: Payer: Self-pay | Admitting: Family

## 2022-01-23 VITALS — BP 129/57 | HR 86 | Temp 97.5°F | Resp 16

## 2022-01-23 DIAGNOSIS — D509 Iron deficiency anemia, unspecified: Secondary | ICD-10-CM

## 2022-01-23 MED ORDER — SODIUM CHLORIDE 0.9 % IV SOLN
Freq: Once | INTRAVENOUS | Status: AC
  Filled 2022-01-23: qty 250

## 2022-01-23 MED ORDER — IRON SUCROSE 20 MG/ML IV SOLN
200.0000 mg | Freq: Once | INTRAVENOUS | Status: AC
  Administered 2022-01-23: 200 mg via INTRAVENOUS
  Filled 2022-01-23: qty 10

## 2022-01-23 MED ORDER — SODIUM CHLORIDE 0.9 % IV SOLN: 200.0000 mg | Freq: Once | INTRAVENOUS | Status: DC

## 2022-01-23 NOTE — Telephone Encounter (Signed)
No it is not scheduled. I looked at the wrong thing. But I am sending it to [facility] to see if they can help schedule her since it is an Urgent request.  ? ? ? ?[name],  ?Please advise on urgent referral, thanks.  ? ?

## 2022-01-23 NOTE — Telephone Encounter (Signed)
Pt called asking was Alicia [name] going to refer her to a gastrology since she has bleeding ulcers. Pt states that she is still nausea and wanted to know if she should get any fluids when she go for infusion today. Please advise. ?

## 2022-01-23 NOTE — Patient Instructions (Signed)

## 2022-01-23 NOTE — Telephone Encounter (Signed)
Please advise 

## 2022-01-23 NOTE — Telephone Encounter (Signed)
Pt was seen today by [facility] at 2pm ?

## 2022-01-26 ENCOUNTER — Telehealth: Payer: Self-pay

## 2022-01-26 NOTE — Telephone Encounter (Signed)
Patient states she was seen on 3/13 filled out a records release for [facility]. She has called office to make sure that they send notes and was advised that release was not received from our office would like to have re faxed to number below.  ? ?Fax number: [phone] ? ? ?

## 2022-01-27 ENCOUNTER — Ambulatory Visit
Admission: RE | Admit: 2022-01-27 | Discharge: 2022-01-27 | Disposition: A | Discharge status: Home / Self Care | Payer: PPO | Source: Ambulatory Visit | Attending: Family | Admitting: Family

## 2022-01-27 DIAGNOSIS — M25562 Pain in left knee: Secondary | ICD-10-CM | POA: Insufficient documentation

## 2022-01-28 NOTE — Progress Notes (Signed)
Ultrasound was negative for DVT  ?It does not appear there is a bakers cyst as suspected. ?Also, I can refer to orthopedist if still with knee pain.

## 2022-01-29 ENCOUNTER — Other Ambulatory Visit: Payer: Self-pay

## 2022-01-29 ENCOUNTER — Inpatient Hospital Stay: Payer: PPO

## 2022-01-29 VITALS — BP 144/64 | HR 80 | Temp 95.8°F | Resp 18

## 2022-01-29 DIAGNOSIS — D509 Iron deficiency anemia, unspecified: Secondary | ICD-10-CM

## 2022-01-29 MED ORDER — IRON SUCROSE 20 MG/ML IV SOLN
200.0000 mg | Freq: Once | INTRAVENOUS | Status: AC
  Administered 2022-01-29: 200 mg via INTRAVENOUS
  Filled 2022-01-29: qty 10

## 2022-01-29 MED ORDER — SODIUM CHLORIDE 0.9 % IV SOLN
Freq: Once | INTRAVENOUS | Status: AC
  Filled 2022-01-29: qty 250

## 2022-01-29 MED ORDER — SODIUM CHLORIDE 0.9 % IV SOLN: 200.0000 mg | Freq: Once | INTRAVENOUS | Status: DC

## 2022-01-29 NOTE — Patient Instructions (Signed)
[facility]  Discharge Instructions: ?Thank you for choosing [facility] to provide your oncology and hematology care.  ?If you have a lab appointment with the Cancer Center, please go directly to the Cancer Center and check in at the registration area. ? ?Wear comfortable clothing and clothing appropriate for easy access to any Portacath or PICC line.  ? ?We strive to give you quality time with your provider. You may need to reschedule your appointment if you arrive late (15 or more minutes).  Arriving late affects you and other patients whose appointments are after yours.  Also, if you miss three or more appointments without notifying the office, you may be dismissed from the clinic at the provider?s discretion.    ?  ?For prescription refill requests, have your pharmacy contact our office and allow 72 hours for refills to be completed.   ? ?Today you received the following chemotherapy and/or immunotherapy agents VENOFER    ?  ?To help prevent nausea and vomiting after your treatment, we encourage you to take your nausea medication as directed. ? ?BELOW ARE SYMPTOMS THAT SHOULD BE REPORTED IMMEDIATELY: ?*FEVER GREATER THAN 100.4 F (38 ?C) OR HIGHER ?*CHILLS OR SWEATING ?*NAUSEA AND VOMITING THAT IS NOT CONTROLLED WITH YOUR NAUSEA MEDICATION ?*UNUSUAL SHORTNESS OF BREATH ?*UNUSUAL BRUISING OR BLEEDING ?*URINARY PROBLEMS (pain or burning when urinating, or frequent urination) ?*BOWEL PROBLEMS (unusual diarrhea, constipation, pain near the anus) ?TENDERNESS IN MOUTH AND THROAT WITH OR WITHOUT PRESENCE OF ULCERS (sore throat, sores in mouth, or a toothache) ?UNUSUAL RASH, SWELLING OR PAIN  ?UNUSUAL VAGINAL DISCHARGE OR ITCHING  ? ?Items with * indicate a potential emergency and should be followed up as soon as possible or go to the Emergency Department if any problems should occur. ? ?Please show the CHEMOTHERAPY ALERT CARD or IMMUNOTHERAPY ALERT CARD at check-in to the  Emergency Department and triage nurse. ? ?Should you have questions after your visit or need to cancel or reschedule your appointment, please contact [facility]  [phone] and follow the prompts.  Office hours are 8:00 a.m. to 4:30 p.m. Monday - Friday. Please note that voicemails left after 4:00 p.m. may not be returned until the following business day.  We are closed weekends and major holidays. You have access to a nurse at all times for urgent questions. Please call the main number to the clinic [phone] and follow the prompts. ? ?For any non-urgent questions, you may also contact your provider using MyChart. We now offer e-Visits for anyone 18 and older to request care online for non-urgent symptoms. For details visit [email or web address]. ?  ?Also download the MyChart app! Go to the app store, search "MyChart", open the app, select [facility], and log in with your MyChart username and password. ? ?Due to Covid, a mask is required upon entering the hospital/clinic. If you do not have a mask, one will be given to you upon arrival. For doctor visits, patients may have 1 support person aged 18 or older with them. For treatment visits, patients cannot have anyone with them due to current Covid guidelines and our immunocompromised population.  ? ?Iron Sucrose Injection ?What is this medication? ?IRON SUCROSE (EYE ern SOO krose) treats low levels of iron (iron deficiency anemia) in people with kidney disease. Iron is a mineral that plays an important role in making red blood cells, which carry oxygen from your lungs to the rest of your body. ?This medicine may   be used for other purposes; ask your health care provider or pharmacist if you have questions. ?COMMON BRAND NAME(S): Venofer ?What should I tell my care team before I take this medication? ?They need to know if you have any of these conditions: ?Anemia not caused by low iron levels ?Heart disease ?High levels of  iron in the blood ?Kidney disease ?Liver disease ?An unusual or allergic reaction to iron, other medications, foods, dyes, or preservatives ?Pregnant or trying to get pregnant ?Breast-feeding ?How should I use this medication? ?This medication is for infusion into a vein. It is given in a hospital or clinic setting. ?Talk to your care team about the use of this medication in children. While this medication may be prescribed for children as young as 2 years for selected conditions, precautions do apply. ?Overdosage: If you think you have taken too much of this medicine contact a poison control center or emergency room at once. ?NOTE: This medicine is only for you. Do not share this medicine with others. ?What if I miss a dose? ?It is important not to miss your dose. Call your care team if you are unable to keep an appointment. ?What may interact with this medication? ?Do not take this medication with any of the following: ?Deferoxamine ?Dimercaprol ?Other iron products ?This medication may also interact with the following: ?Chloramphenicol ?Deferasirox ?This list may not describe all possible interactions. Give your health care provider a list of all the medicines, herbs, non-prescription drugs, or dietary supplements you use. Also tell them if you smoke, drink alcohol, or use illegal drugs. Some items may interact with your medicine. ?What should I watch for while using this medication? ?Visit your care team regularly. Tell your care team if your symptoms do not start to get better or if they get worse. You may need blood work done while you are taking this medication. ?You may need to follow a special diet. Talk to your care team. Foods that contain iron include: whole grains/cereals, dried fruits, beans, or peas, leafy green vegetables, and organ meats (liver, kidney). ?What side effects may I notice from receiving this medication? ?Side effects that you should report to your care team as soon as  possible: ?Allergic reactions--skin rash, itching, hives, swelling of the face, lips, tongue, or throat ?Low blood pressure--dizziness, feeling faint or lightheaded, blurry vision ?Shortness of breath ?Side effects that usually do not require medical attention (report to your care team if they continue or are bothersome): ?Flushing ?Headache ?Joint pain ?Muscle pain ?Nausea ?Pain, redness, or irritation at injection site ?This list may not describe all possible side effects. Call your doctor for medical advice about side effects. You may report side effects to FDA at 1-800-FDA-1088. ?Where should I keep my medication? ?This medication is given in a hospital or clinic and will not be stored at home. ?NOTE: This sheet is a summary. It may not cover all possible information. If you have questions about this medicine, talk to your doctor, pharmacist, or health care provider. ?? 2022 Elsevier/Gold Standard (2021-03-21 00:00:00) ? ?

## 2022-02-03 ENCOUNTER — Other Ambulatory Visit: Payer: Self-pay

## 2022-02-03 ENCOUNTER — Inpatient Hospital Stay: Payer: PPO

## 2022-02-03 VITALS — BP 143/75 | HR 71 | Temp 98.1°F | Resp 16

## 2022-02-03 DIAGNOSIS — D509 Iron deficiency anemia, unspecified: Secondary | ICD-10-CM

## 2022-02-03 MED ORDER — IRON SUCROSE 20 MG/ML IV SOLN
200.0000 mg | Freq: Once | INTRAVENOUS | Status: AC
  Administered 2022-02-03: 200 mg via INTRAVENOUS
  Filled 2022-02-03: qty 10

## 2022-02-03 MED ORDER — SODIUM CHLORIDE 0.9 % IV SOLN: 200.0000 mg | Freq: Once | INTRAVENOUS | Status: DC

## 2022-02-03 MED ORDER — SODIUM CHLORIDE 0.9 % IV SOLN
Freq: Once | INTRAVENOUS | Status: AC
  Filled 2022-02-03: qty 250

## 2022-02-03 NOTE — Patient Instructions (Signed)

## 2022-02-04 ENCOUNTER — Inpatient Hospital Stay: Payer: PPO

## 2022-02-05 ENCOUNTER — Encounter: Payer: Self-pay | Admitting: Internal Medicine

## 2022-02-05 ENCOUNTER — Ambulatory Visit: Payer: PPO | Admitting: Internal Medicine

## 2022-02-05 DIAGNOSIS — F17210 Nicotine dependence, cigarettes, uncomplicated: Secondary | ICD-10-CM

## 2022-02-05 DIAGNOSIS — J449 Chronic obstructive pulmonary disease, unspecified: Secondary | ICD-10-CM | POA: Insufficient documentation

## 2022-02-05 DIAGNOSIS — J4489 Other specified chronic obstructive pulmonary disease: Secondary | ICD-10-CM | POA: Insufficient documentation

## 2022-02-05 MED ORDER — BREZTRI AEROSPHERE 160-9-4.8 MCG/ACT IN AERO: 2.0000 | INHALATION_SPRAY | Freq: Two times a day (BID) | RESPIRATORY_TRACT | 0 refills | Status: DC

## 2022-02-05 MED ORDER — BREZTRI AEROSPHERE 160-9-4.8 MCG/ACT IN AERO: 2.0000 | INHALATION_SPRAY | Freq: Two times a day (BID) | RESPIRATORY_TRACT | 11 refills | Status: DC

## 2022-02-05 NOTE — Patient Instructions (Addendum)
Plan A = Automatic = Always=    Breztri Take 2 puffs first thing in am and then another 2 puffs about 12 hours later.  ?  ?Work on inhaler technique:  relax and gently blow all the way out then take a nice smooth full deep breath back in, triggering the inhaler at same time you start breathing in.  Hold for up to 5 seconds if you can. Blow out thru nose. Rinse and gargle with water when done.  If mouth or throat bother you at all,  try brushing teeth/gums/tongue with arm and hammer toothpaste/ make a slurry and gargle and spit out.  ? ? ?Plan B = Backup (to supplement plan A, not to replace it) ?Only use your albuterol inhaler as a rescue medication to be used if you can't catch your breath by resting or doing a relaxed purse lip breathing pattern.  ?- The less you use it, the better it will work when you need it. ?- Ok to use the inhaler up to 2 puffs  every 4 hours if you must but call for appointment if use goes up over your usual need ?- Don't leave home without it !!  (think of it like the starter fluid / spare tire for your car)  ? ? ?Ok to try albuterol 15 min before an activity (on alternating days)  that you know would usually make you short of breath and see if it makes any difference and if makes none then don't take albuterol after activity unless you can't catch your breath as this means it's the resting that helps, not the albuterol. ? ? ?The key is to stop smoking completely before smoking completely stops you! ?  ? ?Please schedule a follow up office visit in 6 weeks, call sooner if needed with all medications /inhalers/ solutions in hand so we can verify exactly what you are taking. This includes all medications from all doctors and over the counters  ?   ?    ? ?  ?  ?

## 2022-02-05 NOTE — Progress Notes (Signed)
? ?Alicia [name], female    DOB: 09-08-1946,   MRN: [id number] ? ? ?Brief patient profile:  ?66  yowf  active smoker with croup / whooping cough and pna last around 10-11  referred to pulmonary clinic in [facility]  02/05/2022 by  [name] NP  for ? Copd s/p hairdressor @ [facility] stopped in 2012    and moved to [place] and downhill since with breathing  and needing daily inhaler since 2021. ? ?[facility] eval around 2021  "lung function great" ? ?History of Present Illness  ?02/05/2022  Pulmonary/ 1st office eval/ Alicia [name] / [facility]  Office  ?Chief Complaint  ?Patient presents with  ? pulmonary consult  ?  Hx of covid 10/2021--c/o sob with exertion, prod cough with clear sputum and wheezing.   ?Dyspnea:  walks slowly with cane / takes care of yard / iron rx has helped her breathing of late ?Cough: comes and goes/ min mucoid  ?Sleep: flat bed one pillow/ cpap x 2003 [place]  ?SABA use: avg 3 daily  ? ?No obvious day to day or daytime variability or assoc excess/ purulent sputum or mucus plugs or hemoptysis or cp or chest tightness, subjective wheeze or overt sinus or hb symptoms.  ? ?Sleeping  without nocturnal  or early am exacerbation  of respiratory  c/o's or need for noct saba. Also denies any obvious fluctuation of symptoms with weather or environmental changes or other aggravating or alleviating factors except as outlined above  ? ?No unusual exposure hx or h/o childhood pna/ asthma or knowledge of premature birth. ? ?Current Allergies, Complete Past Medical History, Past Surgical History, Family History, and Social History were reviewed in [employer] record. ? ?ROS  The following are not active complaints unless bolded ?Hoarseness, sore throat, dysphagia, dental problems, itching, sneezing,  nasal congestion or discharge of excess mucus or purulent secretions, ear ache,   fever, chills, sweats, unintended wt loss or wt gain, classically pleuritic or exertional cp,  orthopnea  pnd or arm/hand swelling  or leg swelling, presyncope, palpitations, abdominal pain, anorexia, nausea, vomiting, diarrhea  or change in bowel habits or change in bladder habits, change in stools or change in urine, dysuria, hematuria,  rash, arthralgias, visual complaints, headache, numbness, weakness or ataxia or problems with walking/uses cane or coordination,  change in mood or  memory. ?      ?   ? ?Past Medical History:  ?Diagnosis Date  ? Carotid artery occlusion   ? History of hiatal hernia   ? Macular degeneration   ? Sleep apnea   ? ? ?Outpatient Medications Prior to Visit  ?Medication Sig Dispense Refill  ? acetaminophen (TYLENOL) 500 MG tablet Take 1,500 mg by mouth every 6 (six) hours as needed (pain.).    ? Aflibercept (EYLEA IO) Inject 1 Dose into the eye as directed. Every 6 weeks    ? albuterol (PROVENTIL) (2.5 MG/3ML) 0.083% nebulizer solution Take 2.5 mg by nebulization every 6 (six) hours as needed for wheezing or shortness of breath.    ? albuterol (VENTOLIN HFA) 108 (90 Base) MCG/ACT inhaler Inhale 1-2 puffs into the lungs 4 (four) times daily as needed for shortness of breath or wheezing.    ? aspirin EC 81 MG tablet Take 81 mg by mouth every other day. In the morning    ? Cholecalciferol (VITAMIN D-3) 125 MCG (5000 UT) TABS Take 5,000 Units by mouth in the morning.    ? [employer] TRIPLE MAGNESIUM COMPLEX  PO Take by mouth.    ? gabapentin (NEURONTIN) 300 MG capsule TAKE 1 CAPSULE BY MOUTH THREE TIMES A DAY 270 capsule 3  ? Lutein 20 MG TABS Take 20 mg by mouth in the morning.    ? pantoprazole (PROTONIX) 40 MG tablet Take 1 tablet (40 mg total) by mouth daily. 30 tablet 2  ? pramipexole (MIRAPEX) 0.5 MG tablet Take 0.5 mg 2 (two) times daily by mouth.  1  ? triamcinolone (KENALOG) 0.025 % cream Apply 1 application. topically daily as needed.    ? triamcinolone cream (KENALOG) 0.1 % Apply 1 application. topically 2 (two) times daily.    ? venlafaxine XR (EFFEXOR-XR) 37.5 MG 24 hr capsule Take 1  capsule (37.5 mg total) by mouth at bedtime. 90 capsule 1  ?     2  ? ?  ? ? ?Objective:  ?  ? ?BP 140/80 (BP Location: Left Arm, Cuff Size: Large)   Pulse 87   Temp 97.9 ?F (36.6 ?C) (Temporal)   Ht '5\' 1"'$  (1.549 m)   Wt 213 lb (96.6 kg)   SpO2 100%   BMI 40.25 kg/m?  ? ?SpO2: 100 % RA ? ?Amb obese wf nad ? ?HEENT : pt wearing mask not removed for exam due to covid - 19 concerns.  ? ?NECK :  without JVD/Nodes/TM/ nl carotid upstrokes bilaterally ? ? ?LUNGS: no acc muscle use,  Min barrel  contour chest wall with bilateral insp/exp rhonchi  and  without cough on insp or exp maneuvers and min  Hyperresonant  to  percussion bilaterally   ? ? ?CV:  RRR  no s3 or murmur or increase in P2, and no edema  ? ?ABD:  obese soft and nontender with pos end  insp Hoover's  in the supine position. No bruits or organomegaly appreciated, bowel sounds nl ? ?MS:   Nl gait/  ext warm without deformities, calf tenderness, cyanosis or clubbing ?No obvious joint restrictions  ? ?SKIN: warm and dry without lesions   ? ?NEURO:  alert, approp, nl sensorium with  no motor or cerebellar deficits apparent.  ?    ? ?I personally reviewed images and agree with radiology impression as follows:  ?CXR:    12/26/21  ?COPD without acute abnormality. ? ?  ?Lab Results  ?Component Value Date  ? HGB 8.1 (L) 01/13/2022  ? HGB 14.8 07/10/2021  ?  ?  ?   ?Assessment  ? ?Asthmatic bronchitis , chronic (HCC) ?Active smoker ?- 02/05/2022  After extensive coaching inhaler device,  effectiveness =    75% > try breztri 2bid ? ?DDX of  difficult airways management almost all start with A and  include Adherence, Ace Inhibitors, Acid Reflux, Active Sinus Disease, Alpha 1 Antitripsin deficiency, Anxiety masquerading as Airways dz,  ABPA,  Allergy(esp in young), Aspiration (esp in elderly), Adverse effects of meds,  Active smoking or vaping, A bunch of PE's (a small clot burden can't cause this syndrome unless there is already severe underlying pulm or vascular dz  with poor reserve) plus two Bs  = Bronchiectasis and Beta blocker use..and one C= CHF ? ?Adherence is always the initial "prime suspect" and is a multilayered concern that requires a "trust but verify" approach in every patient - starting with knowing how to use medications, especially inhalers, correctly, keeping up with refills and understanding the fundamental difference between maintenance and prns vs those medications only taken for a very short course and then stopped and not  refilled.  ?- see hfa teaching ?- return with all meds in hand using a trust but verify approach to confirm accurate Medication  Reconciliation The principal here is that until we are certain that the  patients are doing what we've asked, it makes no sense to ask them to do more. ? ?Cig smoking also near top (see separate a/p) ? ?? Allergy/asthma component  > breztri should cover this for now  ? ?? Alpha one AT > very unlikely if pfts indeed normal as per Dr Jarome Lamas requested  ? ?? Anxiety/depression/ wt gain/ deconditioning  > usually at the bottom of this list of usual suspects but should be much higher on this pt's based on H and P and note already on psychotropics and may interfere with adherence and also interpretation of response or lack thereof to symptom management which can be quite subjective.  ? ?F/u in 6 weeks and set up pfts then  ? ? ?    ?  ? ?Each maintenance medication was reviewed in detail including emphasizing most importantly the difference between maintenance and prns and under what circumstances the prns are to be triggered using an action plan format where appropriate. ? ?Total time for H and P, chart review, counseling, reviewing when to use which  hfa device(s) and generating customized AVS unique to this initial  office visit / same day charting = 30 min  ?     ? ? ? ? ?Cigarette smoker ?4-5 min discussion re active cigarette smoking in addition to office E&M ? ?Ask about tobacco use:   onoing  ?Advise  quitting   I took an extended  opportunity with this patient to outline the consequences of continued cigarette use  in airway disorders based on all the data we have from the multiple national lung health studies (per

## 2022-02-06 ENCOUNTER — Inpatient Hospital Stay: Payer: PPO

## 2022-02-06 ENCOUNTER — Encounter: Payer: Self-pay | Admitting: Internal Medicine

## 2022-02-06 VITALS — BP 177/71 | HR 85 | Temp 98.1°F

## 2022-02-06 DIAGNOSIS — F17210 Nicotine dependence, cigarettes, uncomplicated: Secondary | ICD-10-CM | POA: Insufficient documentation

## 2022-02-06 DIAGNOSIS — D509 Iron deficiency anemia, unspecified: Secondary | ICD-10-CM | POA: Diagnosis not present

## 2022-02-06 MED ORDER — IRON SUCROSE 20 MG/ML IV SOLN
200.0000 mg | Freq: Once | INTRAVENOUS | Status: AC
  Administered 2022-02-06: 200 mg via INTRAVENOUS
  Filled 2022-02-06: qty 10

## 2022-02-06 MED ORDER — SODIUM CHLORIDE 0.9 % IV SOLN: 200.0000 mg | Freq: Once | INTRAVENOUS | Status: DC

## 2022-02-06 MED ORDER — SODIUM CHLORIDE 0.9 % IV SOLN
Freq: Once | INTRAVENOUS | Status: AC
  Filled 2022-02-06: qty 250

## 2022-02-06 NOTE — Assessment & Plan Note (Addendum)
Active smoker ?- 02/05/2022  After extensive coaching inhaler device,  effectiveness =    75% > try breztri 2bid ? ?DDX of  difficult airways management almost all start with A and  include Adherence, Ace Inhibitors, Acid Reflux, Active Sinus Disease, Alpha 1 Antitripsin deficiency, Anxiety masquerading as Airways dz,  ABPA,  Allergy(esp in young), Aspiration (esp in elderly), Adverse effects of meds,  Active smoking or vaping, A bunch of PE's (a small clot burden can't cause this syndrome unless there is already severe underlying pulm or vascular dz with poor reserve) plus two Bs  = Bronchiectasis and Beta blocker use..and one C= CHF ? ?Adherence is always the initial "prime suspect" and is a multilayered concern that requires a "trust but verify" approach in every patient - starting with knowing how to use medications, especially inhalers, correctly, keeping up with refills and understanding the fundamental difference between maintenance and prns vs those medications only taken for a very short course and then stopped and not refilled.  ?- see hfa teaching ?- return with all meds in hand using a trust but verify approach to confirm accurate Medication  Reconciliation The principal here is that until we are certain that the  patients are doing what we've asked, it makes no sense to ask them to do more. ? ?Cig smoking also near top (see separate a/p) ? ?? Allergy/asthma component  > breztri should cover this for now  ? ?? Alpha one AT > very unlikely if pfts indeed normal as per Dr Jarome Lamas requested  ? ?? Anxiety/depression/ wt gain/ deconditioning  > usually at the bottom of this list of usual suspects but should be much higher on this pt's based on H and P and note already on psychotropics and may interfere with adherence and also interpretation of response or lack thereof to symptom management which can be quite subjective.  ? ?F/u in 6 weeks and set up pfts then  ? ? ?    ?  ? ?Each maintenance medication was  reviewed in detail including emphasizing most importantly the difference between maintenance and prns and under what circumstances the prns are to be triggered using an action plan format where appropriate. ? ?Total time for H and P, chart review, counseling, reviewing when to use which  hfa device(s) and generating customized AVS unique to this initial  office visit / same day charting = 30 min  ?     ? ? ? ?

## 2022-02-06 NOTE — Assessment & Plan Note (Signed)
4-5 min discussion re active cigarette smoking in addition to office E&M ? ?Ask about tobacco use:   onoing  ?Advise quitting   I took an extended  opportunity with this patient to outline the consequences of continued cigarette use  in airway disorders based on all the data we have from the multiple national lung health studies (perfomed over decades at millions of dollars in cost)  indicating that smoking cessation, not choice of inhalers or physicians, is the most important aspect of her care.   ?Assess willingness:  Not committed at this point ?Assist in quit attempt:  Per PCP when ready ?  ? ?Low-dose CT lung cancer screening is recommended for patients who ?are 16-25 years of age with a 20+ pack-year history of smoking and ?who are currently smoking or quit <=15 years ago. ?No coughing up blood  ?No unintentional weight loss of > 15 pounds in the last 6 months  ?>>>  Already planned for 02/11/22  ?For smoking cessation classes call [phone]  ?  ?  ?

## 2022-02-11 ENCOUNTER — Other Ambulatory Visit: Payer: PPO

## 2022-02-12 ENCOUNTER — Telehealth: Payer: Self-pay | Admitting: Family

## 2022-02-12 ENCOUNTER — Telehealth: Payer: Self-pay

## 2022-02-12 ENCOUNTER — Ambulatory Visit (INDEPENDENT_AMBULATORY_CARE_PROVIDER_SITE_OTHER): Payer: PPO | Admitting: Family

## 2022-02-12 ENCOUNTER — Encounter: Payer: Self-pay | Admitting: Family

## 2022-02-12 VITALS — BP 138/82 | HR 85 | Temp 99.0°F | Resp 16 | Ht 61.0 in | Wt 211.1 lb

## 2022-02-12 DIAGNOSIS — R197 Diarrhea, unspecified: Secondary | ICD-10-CM

## 2022-02-12 DIAGNOSIS — R195 Other fecal abnormalities: Secondary | ICD-10-CM | POA: Diagnosis not present

## 2022-02-12 DIAGNOSIS — R1010 Upper abdominal pain, unspecified: Secondary | ICD-10-CM | POA: Diagnosis not present

## 2022-02-12 DIAGNOSIS — R5383 Other fatigue: Secondary | ICD-10-CM

## 2022-02-12 DIAGNOSIS — R10826 Epigastric rebound abdominal tenderness: Secondary | ICD-10-CM

## 2022-02-12 DIAGNOSIS — R110 Nausea: Secondary | ICD-10-CM

## 2022-02-12 DIAGNOSIS — J432 Centrilobular emphysema: Secondary | ICD-10-CM

## 2022-02-12 LAB — CBC
HCT: 35.1 % — ABNORMAL LOW (ref 36.0–46.0)
Hemoglobin: 11 g/dL — ABNORMAL LOW (ref 12.0–15.0)
MCHC: 31.4 g/dL (ref 30.0–36.0)
MCV: 79.5 fl (ref 78.0–100.0)
Platelets: 217 10*3/uL (ref 150.0–400.0)
RBC: 4.41 Mil/uL (ref 3.87–5.11)
RDW: 30 % — ABNORMAL HIGH (ref 11.5–15.5)
WBC: 7.1 10*3/uL (ref 4.0–10.5)

## 2022-02-12 LAB — H. PYLORI ANTIBODY, IGG: H Pylori IgG: NEGATIVE

## 2022-02-12 LAB — TSH: TSH: 2.67 u[IU]/mL (ref 0.35–5.50)

## 2022-02-12 LAB — LIPASE: Lipase: 8 U/L — ABNORMAL LOW (ref 11.0–59.0)

## 2022-02-12 LAB — AMYLASE: Amylase: 24 U/L — ABNORMAL LOW (ref 27–131)

## 2022-02-12 MED ORDER — OMEPRAZOLE 40 MG PO CPDR: 40.0000 mg | DELAYED_RELEASE_CAPSULE | Freq: Every day | ORAL | 0 refills | Status: DC

## 2022-02-12 NOTE — Telephone Encounter (Signed)
Pt called stating that [facility] will still not set up appointment because they do not have [facility]. Please advise.  ?

## 2022-02-12 NOTE — Telephone Encounter (Signed)
Called Alicia [name] and she stated that she would go to [place] and sign a release form so they can send her medical records to [place]. ?

## 2022-02-12 NOTE — Progress Notes (Signed)
? ?Established Patient Office Visit ? ?Subjective:  ?Patient ID: Alicia [name], female    DOB: 1946-10-29  Age: 76 y.o. MRN: [id number] ? ?CC:  ?Chief Complaint  ?Patient presents with  ? Anemia  ?  Finished infusion Friday. Then Wednesday woke up dizzy, headache, and energy went back down.  ? Rectal Bleeding  ? ? ?HPI ?Alicia [name] is here today for follow up.  ?Pt is without acute concerns. ? ?Ct lung: 10/18/21: pulmonary nodule measure up to 8.4 mm in left lower lobe. Was supposed to have 3 month f/u with lung cancer screening clinic, however pt states she went to [facility] and was told this would not be covered by her insurance so she cancelled her appt. I advised her that this is a 3 month f/u for suspicious nodule so very important.also shows emphysema and aortic atherosclerosis.  ? CXR 12/26/21 showing only hyperinflation of lungs, suggestive of copd without acute abn ? ?When she eats she feels very nauseous and has massage her stomach because it is uncomfortable and every time that she has a bowel movement she has diarrhea. She has diarrhea about three times daily. Did have positive FOBT 01/15/22.  ?She is unsure if she has bloody stool however she thinks it looks 'rusty' at times so concerned.  ?Has been feeling dizzy, with headache and fatigue at times, not at current today. ? ?Low iron: did see hematologist. Was given IV iron. Has f/u appt on 04/20/22 ? 01/15/22 FOBT positive  ? Appt with GI pending, stat referral was placed but pt did not answer multiple phone calls so  ?Referrals were closed. Advised pt I am going to urgently place referral again but pt needs to answer phone.  ?  ?Lab Results  ?Component Value Date  ? WBC 7.1 02/12/2022  ? HGB 11.0 (L) 02/12/2022  ? HCT 35.1 (L) 02/12/2022  ? MCV 79.5 02/12/2022  ? PLT 217.0 02/12/2022  ? ? ? ?Past Medical History:  ?Diagnosis Date  ? Carotid artery occlusion   ? COPD with acute exacerbation ([place]) 12/26/2021  ? History of hiatal hernia    ? Macular degeneration   ? Sleep apnea   ? ? ?Past Surgical History:  ?Procedure Laterality Date  ? CHOLECYSTECTOMY  10/2016  ? EYE SURGERY Bilateral 2017  ? cataracts removed  ? LUMBAR LAMINECTOMY/DECOMPRESSION MICRODISCECTOMY Left 07/21/2021  ? Procedure: Laminectomy and Foraminotomy - L4-L5 - left;  Surgeon: Kary Kos, MD;  Location: [facility];  Service: Neurosurgery;  Laterality: Left;  3C  ? PAROTID GLAND TUMOR EXCISION Left 2003  ? ? ?Family History  ?Problem Relation Age of Onset  ? Heart disease Mother   ? Cerebral aneurysm Father   ? ? ?Social History  ? ?Socioeconomic History  ? Marital status: Widowed  ?  Spouse name: Not on file  ? Number of children: Not on file  ? Years of education: Not on file  ? Highest education level: Not on file  ?Occupational History  ? Not on file  ?Tobacco Use  ? Smoking status: Every Day  ?  Packs/day: 1.50  ?  Years: 31.00  ?  Pack years: 46.50  ?  Types: Cigarettes  ?  Passive exposure: Current  ? Smokeless tobacco: Never  ?Vaping Use  ? Vaping Use: Never used  ?Substance and Sexual Activity  ? Alcohol use: Not Currently  ? Drug use: Never  ? Sexual activity: Not Currently  ?Other Topics Concern  ? Not on file  ?  Social History Narrative  ? Not on file  ? ?Social Determinants of Health  ? ?Financial Resource Strain: Not on file  ?Food Insecurity: Not on file  ?Transportation Needs: Not on file  ?Physical Activity: Not on file  ?Stress: Not on file  ?Social Connections: Not on file  ?Intimate Partner Violence: Not on file  ? ? ?Outpatient Medications Prior to Visit  ?Medication Sig Dispense Refill  ? acetaminophen (TYLENOL) 500 MG tablet Take 1,500 mg by mouth every 6 (six) hours as needed (pain.).    ? Aflibercept (EYLEA IO) Inject 1 Dose into the eye as directed. Every 6 weeks    ? albuterol (VENTOLIN HFA) 108 (90 Base) MCG/ACT inhaler Inhale 1-2 puffs into the lungs 4 (four) times daily as needed for shortness of breath or wheezing.    ? aspirin EC 81 MG tablet Take 81 mg by  mouth every other day. In the morning    ? Budeson-Glycopyrrol-Formoterol (BREZTRI AEROSPHERE) 160-9-4.8 MCG/ACT AERO Inhale 2 puffs into the lungs 2 (two) times daily. 10.7 g 11  ? Cholecalciferol (VITAMIN D-3) 125 MCG (5000 UT) TABS Take 5,000 Units by mouth in the morning.    ? [employer] TRIPLE MAGNESIUM COMPLEX PO Take by mouth.    ? gabapentin (NEURONTIN) 300 MG capsule TAKE 1 CAPSULE BY MOUTH THREE TIMES A DAY 270 capsule 3  ? Lutein 20 MG TABS Take 20 mg by mouth in the morning.    ? pramipexole (MIRAPEX) 0.5 MG tablet Take 0.5 mg 2 (two) times daily by mouth.  1  ? triamcinolone (KENALOG) 0.025 % cream Apply 1 application. topically daily as needed.    ? triamcinolone cream (KENALOG) 0.1 % Apply 1 application. topically 2 (two) times daily.    ? venlafaxine XR (EFFEXOR-XR) 37.5 MG 24 hr capsule Take 1 capsule (37.5 mg total) by mouth at bedtime. 90 capsule 1  ? pantoprazole (PROTONIX) 40 MG tablet Take 1 tablet (40 mg total) by mouth daily. 30 tablet 2  ? albuterol (PROVENTIL) (2.5 MG/3ML) 0.083% nebulizer solution Take 2.5 mg by nebulization every 6 (six) hours as needed for wheezing or shortness of breath. (Patient not taking: Reported on 02/12/2022)    ? ?No facility-administered medications prior to visit.  ? ? ?Allergies  ?Allergen Reactions  ? Penicillins Other (See Comments)  ?  Yeast infection ?  ? ? ?ROS ?Review of Systems  ?Constitutional:  Positive for fatigue. Negative for chills, fever and unexpected weight change.  ?Eyes:  Negative for visual disturbance.  ?Respiratory:  Negative for cough, chest tightness, shortness of breath and wheezing.   ?Cardiovascular:  Negative for chest pain.  ?Gastrointestinal:  Negative for abdominal pain.  ?Genitourinary:  Negative for difficulty urinating.  ?Skin:  Negative for rash.  ?Neurological:  Positive for dizziness and headaches.  ? ? ?  ?Objective:  ?  ?Physical Exam ?Vitals reviewed.  ?Constitutional:   ?   General: She is not in acute distress. ?   Appearance:  Normal appearance. She is not ill-appearing or toxic-appearing.  ?HENT:  ?   Right Ear: Tympanic membrane normal.  ?   Left Ear: Tympanic membrane normal.  ?   Mouth/Throat:  ?   Mouth: Mucous membranes are moist.  ?   Pharynx: No pharyngeal swelling.  ?   Tonsils: No tonsillar exudate.  ?Neck:  ?   Thyroid: No thyroid mass.  ?Cardiovascular:  ?   Rate and Rhythm: Normal rate and regular rhythm.  ?Pulmonary:  ?  Effort: Pulmonary effort is normal.  ?   Breath sounds: Normal breath sounds.  ?Abdominal:  ?   General: Abdomen is flat. Bowel sounds are normal.  ?   Palpations: Abdomen is soft. There is no mass.  ?   Tenderness: There is abdominal tenderness (epigastric and bil upper quadrant). There is no guarding or rebound.  ?Lymphadenopathy:  ?   Cervical:  ?   Right cervical: No superficial cervical adenopathy. ?   Left cervical: No superficial cervical adenopathy.  ?Skin: ?   General: Skin is warm.  ?   Capillary Refill: Capillary refill takes less than 2 seconds.  ?Neurological:  ?   General: No focal deficit present.  ?   Mental Status: She is alert and oriented to person, place, and time.  ?   Cranial Nerves: No cranial nerve deficit.  ?Psychiatric:     ?   Mood and Affect: Mood normal.     ?   Behavior: Behavior normal.     ?   Thought Content: Thought content normal.     ?   Judgment: Judgment normal.  ? ? ? ?BP 138/82   Pulse 85   Temp 99 ?F (37.2 ?C)   Resp 16   Ht '5\' 1"'$  (1.549 m)   Wt 211 lb 1 oz (95.7 kg)   SpO2 96%   BMI 39.88 kg/m?  ?Wt Readings from Last 3 Encounters:  ?02/12/22 211 lb 1 oz (95.7 kg)  ?02/05/22 213 lb (96.6 kg)  ?01/19/22 214 lb (97.1 kg)  ? ? ? ?Health Maintenance Due  ?Topic Date Due  ? Hepatitis C Screening  Never done  ? TETANUS/TDAP  Never done  ? Zoster Vaccines- Shingrix (1 of 2) Never done  ? ? ?There are no preventive care reminders to display for this patient. ? ?Lab Results  ?Component Value Date  ? TSH 2.67 02/12/2022  ? ?Lab Results  ?Component Value Date  ? WBC 7.1  02/12/2022  ? HGB 11.0 (L) 02/12/2022  ? HCT 35.1 (L) 02/12/2022  ? MCV 79.5 02/12/2022  ? PLT 217.0 02/12/2022  ? ?Lab Results  ?Component Value Date  ? NA 135 01/13/2022  ? K 3.9 01/13/2022  ? CO2 25 03/07/

## 2022-02-12 NOTE — Patient Instructions (Addendum)
Tomorrow AM, arrive at 8:15am, NPO after 12am midnight ? ?[facility] - check in at Radiology ? ?Stop by the lab prior to leaving today. I will notify you of your results once received.  ? ?I am ordering a stat ultrasound, don't leave until I tell you to.  ? ?Stop pantoprazole 40 mg once daily, start omeprazole 40 mg daily.  ? ?Call [facility], I placed a new referral.  ? ?It was a pleasure seeing you today! Please do not hesitate to reach out with any questions and or concerns. ? ?Regards,  ? ?Malinda Mayden ?FNP-C ? ? ? ?

## 2022-02-13 ENCOUNTER — Ambulatory Visit: Payer: PPO | Admitting: Family

## 2022-02-13 ENCOUNTER — Ambulatory Visit
Admission: RE | Admit: 2022-02-13 | Discharge: 2022-02-13 | Disposition: A | Discharge status: Home / Self Care | Payer: PPO | Source: Ambulatory Visit | Attending: Family | Admitting: Family

## 2022-02-13 ENCOUNTER — Encounter: Payer: Self-pay | Admitting: Family

## 2022-02-13 DIAGNOSIS — R110 Nausea: Secondary | ICD-10-CM | POA: Diagnosis present

## 2022-02-13 DIAGNOSIS — J438 Other emphysema: Secondary | ICD-10-CM | POA: Insufficient documentation

## 2022-02-13 DIAGNOSIS — R1010 Upper abdominal pain, unspecified: Secondary | ICD-10-CM

## 2022-02-13 DIAGNOSIS — R10826 Epigastric rebound abdominal tenderness: Secondary | ICD-10-CM | POA: Insufficient documentation

## 2022-02-13 DIAGNOSIS — J432 Centrilobular emphysema: Secondary | ICD-10-CM | POA: Insufficient documentation

## 2022-02-13 NOTE — Assessment & Plan Note (Signed)
Lab workup ordered pending results ?

## 2022-02-13 NOTE — Assessment & Plan Note (Signed)
Stop pantoprazole, RX sent and pt to start omeprazole 40 mg  ?Try to decrease and or avoid spicy foods, fried fatty foods, and also caffeine and chocolate as these can increase heartburn symptoms.  ? ?

## 2022-02-13 NOTE — Assessment & Plan Note (Signed)
Abdominal u/s pending results ?

## 2022-02-13 NOTE — Assessment & Plan Note (Signed)
Long d/w pt on having a ability to be contacted when called to schedule Gi appt  ?Stat referral again placed to Gi and pt informed to all office to schedule for positive FOBT  ?

## 2022-02-13 NOTE — Assessment & Plan Note (Signed)
Advised pt to call lung screening center to again let them know she is due for 3 month repeat due to suspicious finding. Pt is going to call to let them know and schedule f/u scan. ?

## 2022-02-13 NOTE — Assessment & Plan Note (Signed)
Stool cultures ordered pending results ?

## 2022-02-13 NOTE — Assessment & Plan Note (Signed)
Stat US abdomen ordered ?Ordered abd panel to include lipase, amylase, alpha gal h pylori and cbc bmp ?

## 2022-02-16 ENCOUNTER — Other Ambulatory Visit: Payer: PPO

## 2022-02-16 DIAGNOSIS — R197 Diarrhea, unspecified: Secondary | ICD-10-CM

## 2022-02-16 LAB — ALPHA-GAL PANEL
Allergen, Mutton, f88: <0.1 kU/L
Allergen, Pork, f26: <0.1 kU/L
Beef: <0.1 kU/L
CLASS: 0
CLASS: 0
Class: 0
GALACTOSE-ALPHA-1,3-GALACTOSE IGE*: <0.1 kU/L (ref <0.10)

## 2022-02-16 LAB — INTERPRETATION:

## 2022-02-17 ENCOUNTER — Telehealth: Payer: Self-pay

## 2022-02-17 NOTE — Telephone Encounter (Signed)
Left message to return call to our office.  

## 2022-02-17 NOTE — Telephone Encounter (Signed)
Pt wanted me to inform you that her lung scan is scheduled April 24th. ?

## 2022-02-17 NOTE — Telephone Encounter (Signed)
Pt returning your call

## 2022-02-19 ENCOUNTER — Other Ambulatory Visit: Payer: Self-pay | Admitting: Family

## 2022-02-19 DIAGNOSIS — I714 Abdominal aortic aneurysm, without rupture, unspecified: Secondary | ICD-10-CM

## 2022-02-19 LAB — C. DIFFICILE GDH AND TOXIN A/B
GDH ANTIGEN: NOT DETECTED
MICRO NUMBER:: 13242255
SPECIMEN QUALITY:: ADEQUATE
TOXIN A AND B: NOT DETECTED

## 2022-02-19 LAB — GIARDIA ANTIGEN
MICRO NUMBER:: 13242253
RESULT:: NOT DETECTED
SPECIMEN QUALITY:: ADEQUATE

## 2022-02-19 LAB — GASTROINTESTINAL PATHOGEN PNL

## 2022-02-19 NOTE — Progress Notes (Signed)
Dr. Donzetta Matters I am Cc'd you on this so that you can review abdominal u/s with new finding AAA. I have placed an order for a CTA. ? ?(FYI) Was able to get in touch with pt and d/w her in detail. ?She does see vascular surgeon Dr. Servando Snare, last seen in June 17th, 2022 for carotid artery stenosis which is stable. However, new finding of AAA.

## 2022-02-20 ENCOUNTER — Other Ambulatory Visit: Payer: PPO

## 2022-02-23 ENCOUNTER — Encounter: Payer: Self-pay | Admitting: Internal Medicine

## 2022-02-23 NOTE — Progress Notes (Signed)
Clarification, ordered CTA abd pelvis

## 2022-02-25 ENCOUNTER — Telehealth: Payer: Self-pay | Admitting: Family

## 2022-02-25 DIAGNOSIS — I714 Abdominal aortic aneurysm, without rupture, unspecified: Secondary | ICD-10-CM

## 2022-02-25 NOTE — Telephone Encounter (Signed)
Called pt and informed her that her referral for vascular has been put in. ?

## 2022-02-25 NOTE — Telephone Encounter (Signed)
Pt called stating that Dr Servando Snare needs a referral before she can be seen at Vascular Surgery. Please advise. ?

## 2022-03-02 ENCOUNTER — Telehealth: Payer: Self-pay | Admitting: Family

## 2022-03-02 ENCOUNTER — Ambulatory Visit
Admission: RE | Admit: 2022-03-02 | Discharge: 2022-03-02 | Disposition: A | Discharge status: Home / Self Care | Payer: HMO | Source: Ambulatory Visit | Attending: Family | Admitting: Family

## 2022-03-02 ENCOUNTER — Telehealth: Payer: Self-pay | Admitting: Internal Medicine

## 2022-03-02 ENCOUNTER — Encounter: Payer: Self-pay | Admitting: Oncology

## 2022-03-02 DIAGNOSIS — R911 Solitary pulmonary nodule: Secondary | ICD-10-CM

## 2022-03-02 NOTE — Telephone Encounter (Signed)
Pt would like a call back regarding her CT Scans she's having this week. She wants to be sure this is ok.  ?

## 2022-03-02 NOTE — Telephone Encounter (Signed)
Be sure to use arm and hammer toothpaste after use of breztri and just do one puff twice daily and if still irritating mouth then stop it  ? ?Can teach spacer technique on return if doesn't already have one ?

## 2022-03-02 NOTE — Telephone Encounter (Signed)
Spoke to patient and relayed below message/recommendations.  ?She stated that she is currently using spacer.  ?Nothing further needed at this time.  ? ?

## 2022-03-02 NOTE — Progress Notes (Signed)
Positive findings on the CT lung scan, in terms of good news.  ?Benign appearance of lung nodules and recommendation to continue /repeat Ct scan in 12 months from now to monitor stability.  ?Still seen are plaques on the aorta and in the coronary arteries, important to keep close eye on cholesterol. Also with emphysema which we knew

## 2022-03-02 NOTE — Telephone Encounter (Signed)
Spoke to patient.  ?She reports of sore throat since starting starting Breztri.  ?This occurred 3-4d after starting sample.  ?Denied cough, wheezing, fever, chills, sweats or additional sx.  ?She stated that she is rinsing her mouth out after each use.  ? ?Dr. Melvyn Novas, please advise. Thanks ?

## 2022-03-03 ENCOUNTER — Encounter: Payer: Self-pay | Admitting: Oncology

## 2022-03-03 ENCOUNTER — Telehealth: Payer: Self-pay

## 2022-03-03 NOTE — Telephone Encounter (Signed)
Patient calling to see if it is safe for the patient to have 2 different CT scans in one week done or should she reschedule? ? ?She had one earlier this week and another is scheduled for 03/05/22. Please advise. Cb [phone] no one answers when we call back it is ok to leave a detailed message on the voicemail with instructions ?

## 2022-03-03 NOTE — Telephone Encounter (Signed)
Called and spoke to the pt about Ct scan ?

## 2022-03-04 ENCOUNTER — Encounter: Payer: Self-pay | Admitting: Oncology

## 2022-03-04 NOTE — Telephone Encounter (Signed)
Informed pt that it was ok . ?

## 2022-03-05 ENCOUNTER — Ambulatory Visit
Admission: RE | Admit: 2022-03-05 | Discharge: 2022-03-05 | Disposition: A | Discharge status: Home / Self Care | Payer: PPO | Source: Ambulatory Visit | Attending: Family | Admitting: Family

## 2022-03-05 DIAGNOSIS — I714 Abdominal aortic aneurysm, without rupture, unspecified: Secondary | ICD-10-CM | POA: Insufficient documentation

## 2022-03-05 DIAGNOSIS — I774 Celiac artery compression syndrome: Secondary | ICD-10-CM | POA: Diagnosis present

## 2022-03-05 LAB — POCT I-STAT CREATININE: Creatinine, Ser: 0.7 mg/dL (ref 0.44–1.00)

## 2022-03-05 MED ORDER — IOHEXOL 350 MG/ML SOLN
100.0000 mL | Freq: Once | INTRAVENOUS | Status: AC | PRN
  Administered 2022-03-05: 100 mL via INTRAVENOUS

## 2022-03-07 ENCOUNTER — Encounter: Payer: Self-pay | Admitting: Family

## 2022-03-09 ENCOUNTER — Other Ambulatory Visit: Payer: Self-pay | Admitting: Family

## 2022-03-09 DIAGNOSIS — I774 Celiac artery compression syndrome: Secondary | ICD-10-CM | POA: Insufficient documentation

## 2022-03-09 NOTE — Progress Notes (Signed)
Dr. Donzetta Matters,  ? ?Pt has seen you previously in the years past. 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 Can you see pt in your office more urgently?  ? ?AAA is new as well, was initially seen on Ct abd/pelvis. 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 Thank you for your time.

## 2022-03-10 ENCOUNTER — Other Ambulatory Visit: Payer: Self-pay | Admitting: *Deleted

## 2022-03-10 DIAGNOSIS — K551 Chronic vascular disorders of intestine: Secondary | ICD-10-CM

## 2022-03-11 ENCOUNTER — Telehealth: Payer: Self-pay

## 2022-03-13 NOTE — Telephone Encounter (Signed)
Three month appt made. ?

## 2022-03-18 ENCOUNTER — Ambulatory Visit (INDEPENDENT_AMBULATORY_CARE_PROVIDER_SITE_OTHER): Payer: PPO | Admitting: Internal Medicine

## 2022-03-18 ENCOUNTER — Encounter: Payer: Self-pay | Admitting: Internal Medicine

## 2022-03-18 VITALS — BP 146/84 | HR 82 | Ht 61.0 in | Wt 206.0 lb

## 2022-03-18 DIAGNOSIS — R195 Other fecal abnormalities: Secondary | ICD-10-CM | POA: Diagnosis not present

## 2022-03-18 DIAGNOSIS — D509 Iron deficiency anemia, unspecified: Secondary | ICD-10-CM | POA: Diagnosis not present

## 2022-03-18 DIAGNOSIS — R197 Diarrhea, unspecified: Secondary | ICD-10-CM | POA: Diagnosis not present

## 2022-03-18 DIAGNOSIS — R1310 Dysphagia, unspecified: Secondary | ICD-10-CM

## 2022-03-18 MED ORDER — NA SULFATE-K SULFATE-MG SULF 17.5-3.13-1.6 GM/177ML PO SOLN: 1.0000 | Freq: Once | ORAL | 0 refills | Status: AC

## 2022-03-18 NOTE — Patient Instructions (Signed)
If you are age 77 or older, your body mass index should be between 23-30. Your Body mass index is 38.92 kg/m?Marland Kitchen If this is out of the aforementioned range listed, please consider follow up with your Primary Care Provider. ?________________________________________________________ ? ?The [facility] providers would like to encourage you to use [facility] to communicate with providers for non-urgent requests or questions.  Due to long hold times on the telephone, sending your provider a message by [facility] may be a faster and more efficient way to get a response.  Please allow 48 business hours for a response.  Please remember that this is for non-urgent requests.  ?_______________________________________________________ ? ?You have been scheduled for an endoscopy and colonoscopy. Please follow the written instructions given to you at your visit today. ?Please pick up your prep supplies at the pharmacy within the next 1-3 days. ?If you use inhalers (even only as needed), please bring them with you on the day of your procedure. ? ?Thank you for entrusting me with your care and choosing [facility]. ? ?Dr Lorenso Courier ?

## 2022-03-18 NOTE — Progress Notes (Addendum)
? ?Chief Complaint: Positive hemoccult, nausea ? ?HPI : 76 year old female with history of COPD, hiatal hernia, OSA presents with positive hemoccult ? ?Patient presents with blood in her stools over the last several months. She had a positive hemoccult test checked in 01/2022. She has also recently been noted to have anemia. 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 When she eats, she will start to fill up with gas and has significant abdominal distension. If she doesn't make it to the bathroom, then she feels like she will have some fecal incontinence. She on average has about 5-6 BMs per day, which increased from her normal of 1-2 BMs per day. She had a colonoscopy in 05/2021 of last year that showed diverticulosis and a small hyperplastic polyp that was removed. Ever since that colonoscopy, she has not had a regular BM since then. She has had more frequent BMs, and she is seeing small amounts of blood when she has a BM. This bleeding started after her last colonoscopy. Back in the fall, she had quite a bit of blood. Denies use of blood thinners. Takes a baby aspirin but otherwise denies use of other NSAIDs. She has some pain in the lower abdomen. She was put on omeprazole 40 mg BID for control of N&V. Her N&V has improved on PPI therapy. Denies chest burning or regurgitation. Every now and then she will experience dysphagia. She had an EGD done in 2022 that was normal. Stools have appeared black for the last 2-3 months. Denies fam hx of GI issues.  Her weight has been going down slightly, which she attributes to a poor appetite due to nausea. She has lost about 20-30 lbs over the last year.  ? ?Wt Readings from Last 3 Encounters:  ?03/18/22 206 lb (93.4 kg)  ?02/12/22 211 lb 1 oz (95.7 kg)  ?02/05/22 213 lb (96.6 kg)  ? ?Past Medical History:  ?Diagnosis Date  ? Carotid artery occlusion   ? COPD with acute exacerbation ([place]) 12/26/2021  ? History of hiatal hernia   ? Macular degeneration   ? Sleep apnea   ? ?Past Surgical  History:  ?Procedure Laterality Date  ? APPENDECTOMY    ? CHOLECYSTECTOMY  10/2016  ? EYE SURGERY Bilateral 2017  ? cataracts removed  ? LUMBAR LAMINECTOMY/DECOMPRESSION MICRODISCECTOMY Left 07/21/2021  ? Procedure: Laminectomy and Foraminotomy - L4-L5 - left;  Surgeon: Kary Kos, MD;  Location: [facility];  Service: Neurosurgery;  Laterality: Left;  3C  ? PAROTID GLAND TUMOR EXCISION Left 2003  ? ?Family History  ?Problem Relation Age of Onset  ? Heart disease Mother   ? Cerebral aneurysm Father   ? ?Social History  ? ?Tobacco Use  ? Smoking status: Every Day  ?  Packs/day: 1.50  ?  Years: 31.00  ?  Pack years: 46.50  ?  Types: Cigarettes  ?  Passive exposure: Current  ? Smokeless tobacco: Never  ?Vaping Use  ? Vaping Use: Never used  ?Substance Use Topics  ? Alcohol use: Not Currently  ? Drug use: Never  ? ?Current Outpatient Medications  ?Medication Sig Dispense Refill  ? acetaminophen (TYLENOL) 500 MG tablet Take 1,500 mg by mouth every 6 (six) hours as needed (pain.).    ? Aflibercept (EYLEA IO) Inject 1 Dose into the eye as directed. Every 6 weeks    ? albuterol (VENTOLIN HFA) 108 (90 Base) MCG/ACT inhaler Inhale 1-2 puffs into the lungs 4 (four) times daily as needed for shortness of breath  or wheezing.    ? aspirin EC 81 MG tablet Take 81 mg by mouth every other day. In the morning    ? Budeson-Glycopyrrol-Formoterol (BREZTRI AEROSPHERE) 160-9-4.8 MCG/ACT AERO Inhale 2 puffs into the lungs 2 (two) times daily. 10.7 g 11  ? Cholecalciferol (VITAMIN D-3) 125 MCG (5000 UT) TABS Take 5,000 Units by mouth in the morning.    ? [employer]S TRIPLE MAGNESIUM COMPLEX PO Take by mouth.    ? gabapentin (NEURONTIN) 300 MG capsule TAKE 1 CAPSULE BY MOUTH THREE TIMES A DAY 270 capsule 3  ? Lutein 20 MG TABS Take 20 mg by mouth in the morning.    ? omeprazole (PRILOSEC) 40 MG capsule Take 1 capsule (40 mg total) by mouth daily. (Patient taking differently: Take 40 mg by mouth 2 (two) times daily.) 90 capsule 0  ? pramipexole (MIRAPEX)  0.5 MG tablet Take 0.5 mg 2 (two) times daily by mouth.  1  ? triamcinolone (KENALOG) 0.025 % cream Apply 1 application. topically daily as needed.    ? triamcinolone cream (KENALOG) 0.1 % Apply 1 application. topically 2 (two) times daily.    ? venlafaxine XR (EFFEXOR-XR) 37.5 MG 24 hr capsule Take 1 capsule (37.5 mg total) by mouth at bedtime. 90 capsule 1  ? ?No current facility-administered medications for this visit.  ? ?Allergies  ?Allergen Reactions  ? Penicillins Other (See Comments)  ?  Yeast infection ?  ? ?Review of Systems: ?All systems reviewed and negative except where noted in HPI.  ? ?Physical Exam: ?BP (!) 146/84   Pulse 82   Ht '5\' 1"'$  (1.549 m)   Wt 206 lb (93.4 kg)   BMI 38.92 kg/m?  ?Constitutional: Pleasant,well-developed, female in no acute distress. ?HEENT: Normocephalic and atraumatic. Conjunctivae are normal. No scleral icterus. ?Cardiovascular: Normal rate, regular rhythm.  ?Pulmonary/chest: Effort normal and breath sounds normal. No wheezing, rales or rhonchi. ?Abdominal: Soft, nondistended, nontender. Bowel sounds active throughout. There are no masses palpable. No hepatomegaly. ?Extremities: No edema ?Neurological: Alert and oriented to person place and time. ?Skin: Skin is warm and dry. No rashes noted. ?Psychiatric: Normal mood and affect. Behavior is normal. ? ?Labs 01/2022: Positive hemoccult. Low ferritin of 4.8. CBC with low Hb of 8.1. CMP nml. ? ?Labs 02/2022: C dif negative. Giardia negative. TSH nml. CBC with low Hb of 11. Lipase low at 8. Amylase low at 24. H pylori IgG negative. Alpha gal panel negative.  ? ?Abdominal U/S 02/13/22: ?IMPRESSION: ?1. Unremarkable examination of the liver and spleen. Status post cholecystectomy. ?2.  No evidence of nephrolithiasis or hydronephrosis. ?3.  Pancreas not clearly visualized due to bowel gas shadowing. ?4. 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 ? ?CTA A/P w/contrast 03/05/22: ?IMPRESSION: ?VASCULAR ?1. 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 ?2. 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 ?3. 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 ?NON-VASCULAR ?1. Mild distal colonic diverticulosis. ?2. Multiple chronic fracture deformities of the pelvis ? ?Colonoscopy 05/01/21: ?Moderate diverticulosis in the left side of the colon. 3 mm polyp found and removed. Internal hemorrhoids. ?Path: Hyperplastic polyp ? ?ASSESSMENT AND PLAN: ?IDA ?Bloody diarrhea ?Melena ?Dysphagia ?Patient presents with IDA, bloody diarrhea, and melena that started after her last colonoscopy. Unclear to me what is driving her blood loss at this time, but could consider contribution from  a diverticular bleed or hemorrhoids. Would want to rule out more concerning etiologies. Will plan to start off with EGD and colonoscopy for evaluation. If negative, then would consider VCE for further work up. ?- EGD/colonoscopy [facility] ? ?Christia Reading, MD ? ?I spent 62 minutes of time, including in depth chart review, independent review of results as outlined above, communicating results with the patient directly, face-to-face time with the patient, coordinating care, ordering studies and medications as appropriate, and documentation.   ? ?ADDENDUM: Received additional records from [facility]. Patient had an EGD in 04/17/21 that showed irregular Z-line that was biopsied, distal and proximal esophageal biopsies taken for EoE, gastritis that was biopsied, small hiatal hernia. ?

## 2022-03-20 IMAGING — MG MM DIGITAL SCREENING BILAT W/ TOMO AND CAD
6 of 12 series · 6 of 36 positions shown · non-contrast
Comparison: Previous exam(s).

CLINICAL DATA: Screening.

EXAM:
DIGITAL SCREENING BILATERAL MAMMOGRAM WITH TOMOSYNTHESIS AND CAD
TECHNIQUE: Bilateral screening digital craniocaudal and mediolateral oblique
mammograms were obtained. Bilateral screening digital breast
tomosynthesis was performed. The images were evaluated with
computer-aided detection.

[R MLO synth-2D (1 of 2)]
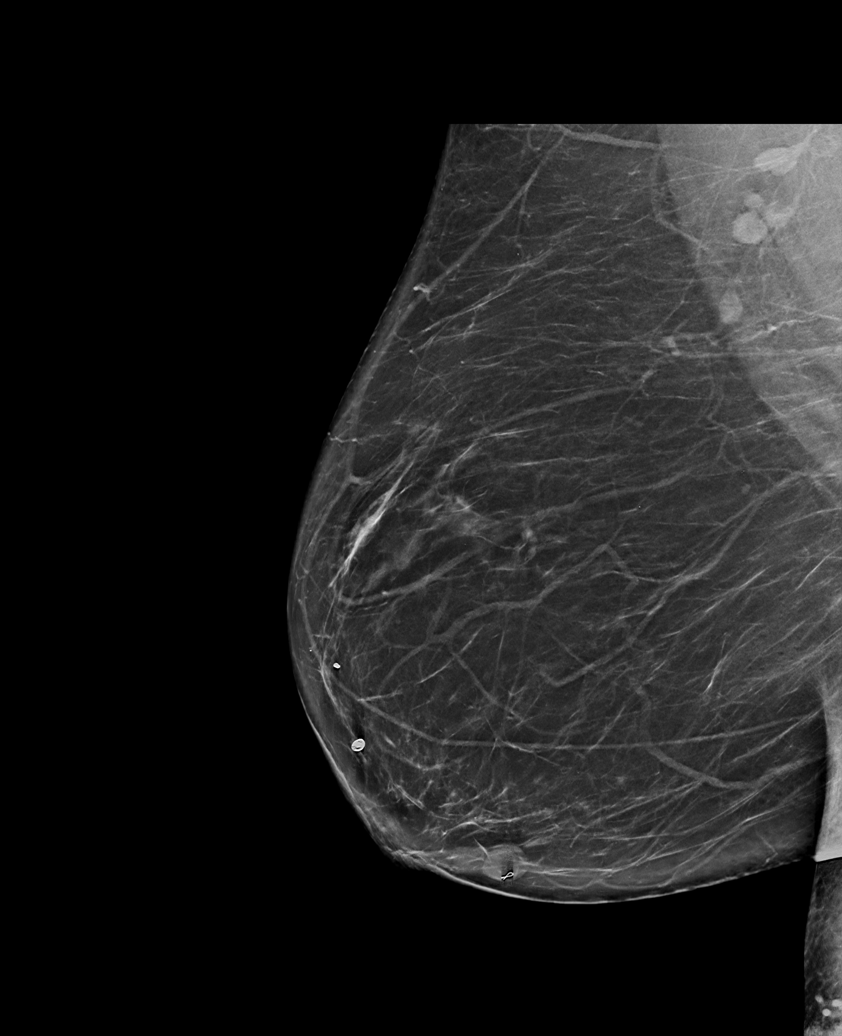

[L MLO synth-2D (1 of 2)]
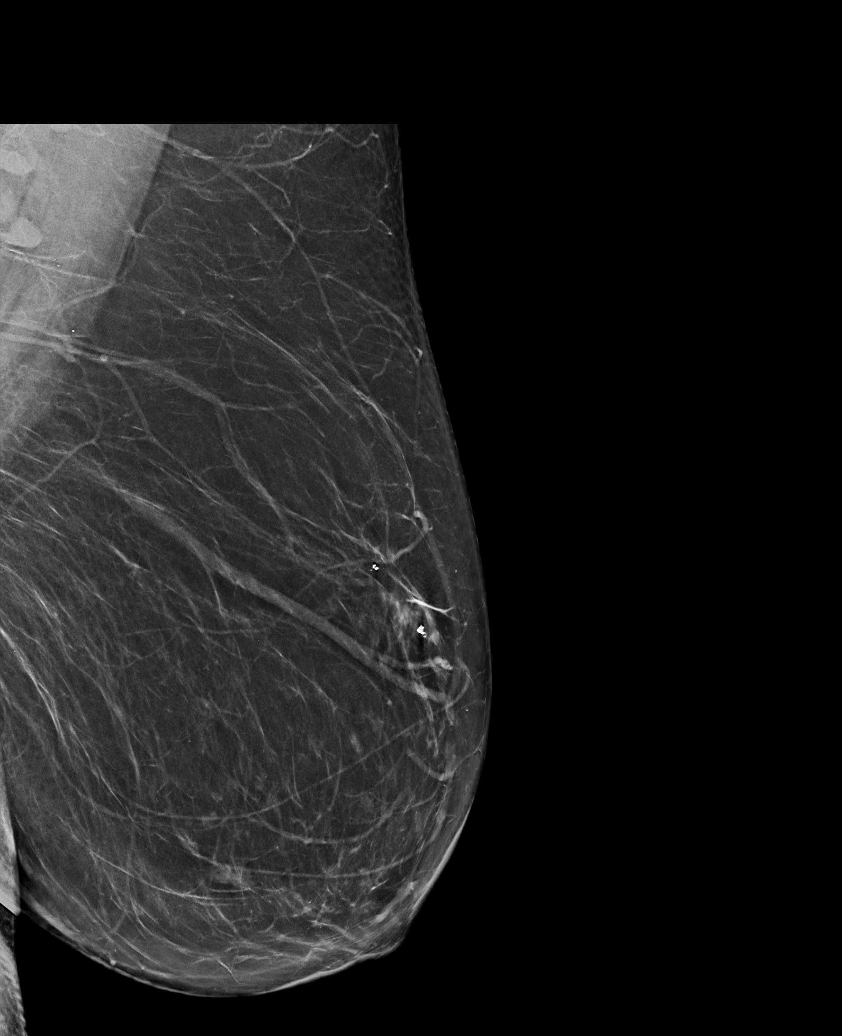

[L CC synth-2D]
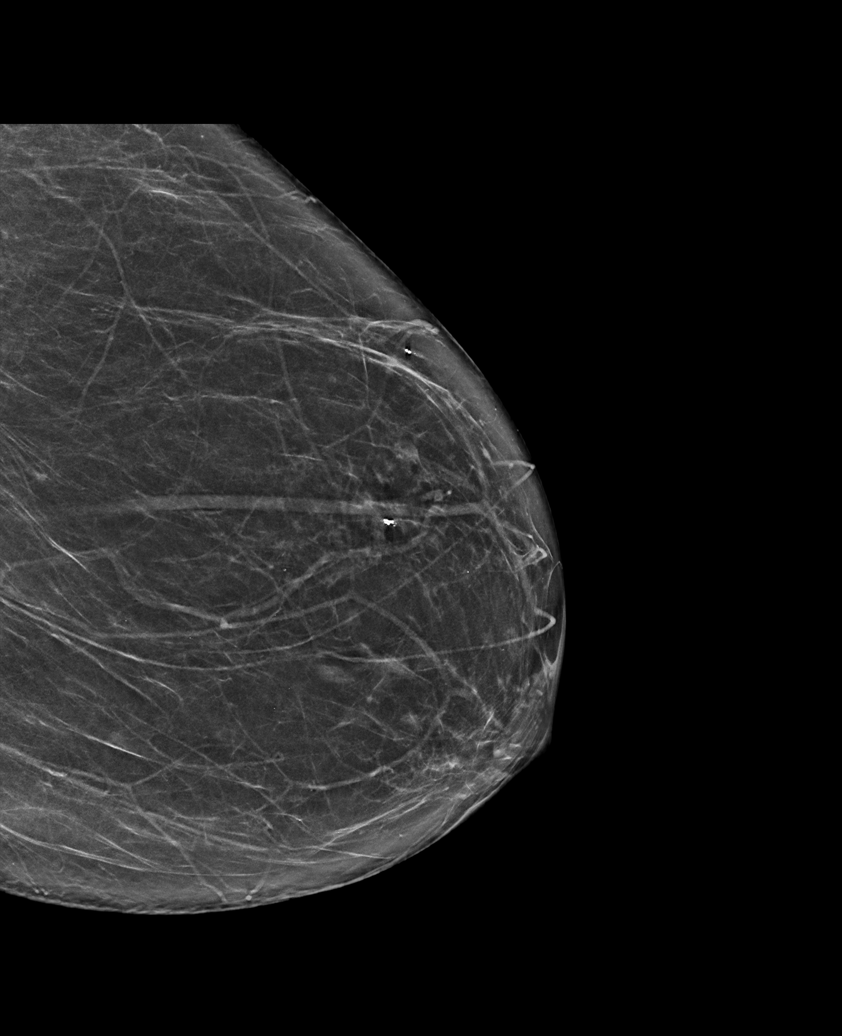

[R CC synth-2D]
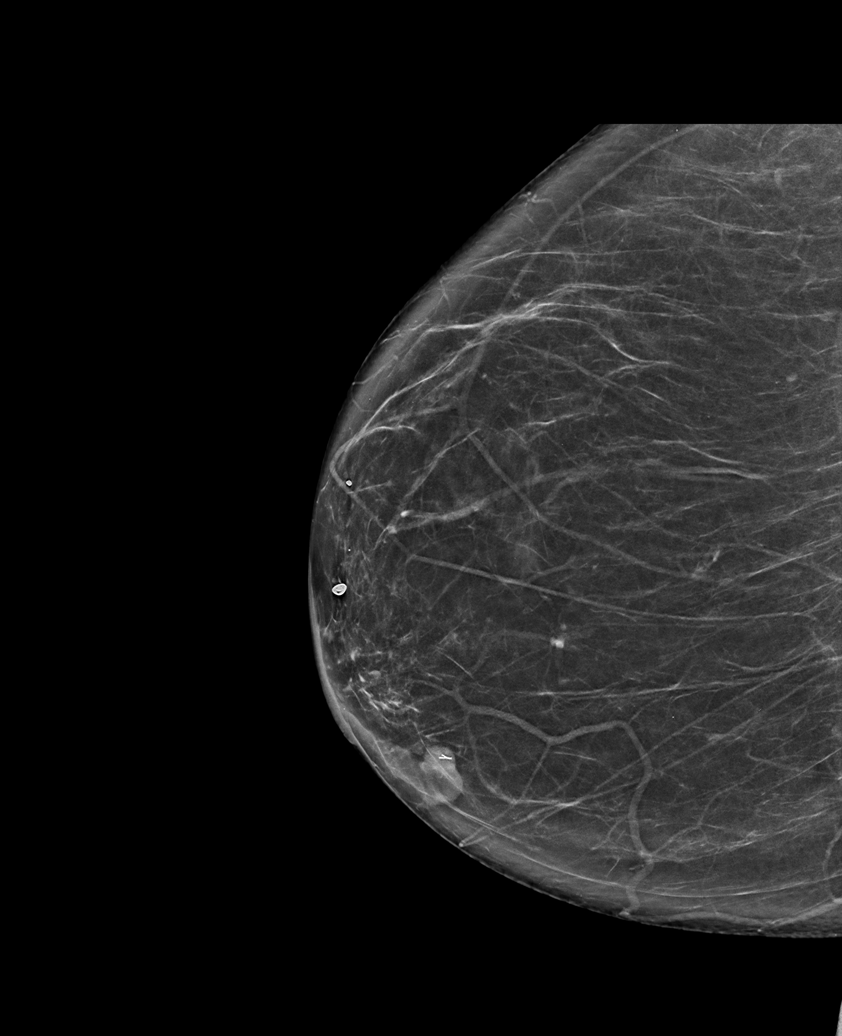

[L MLO synth-2D (2 of 2)]
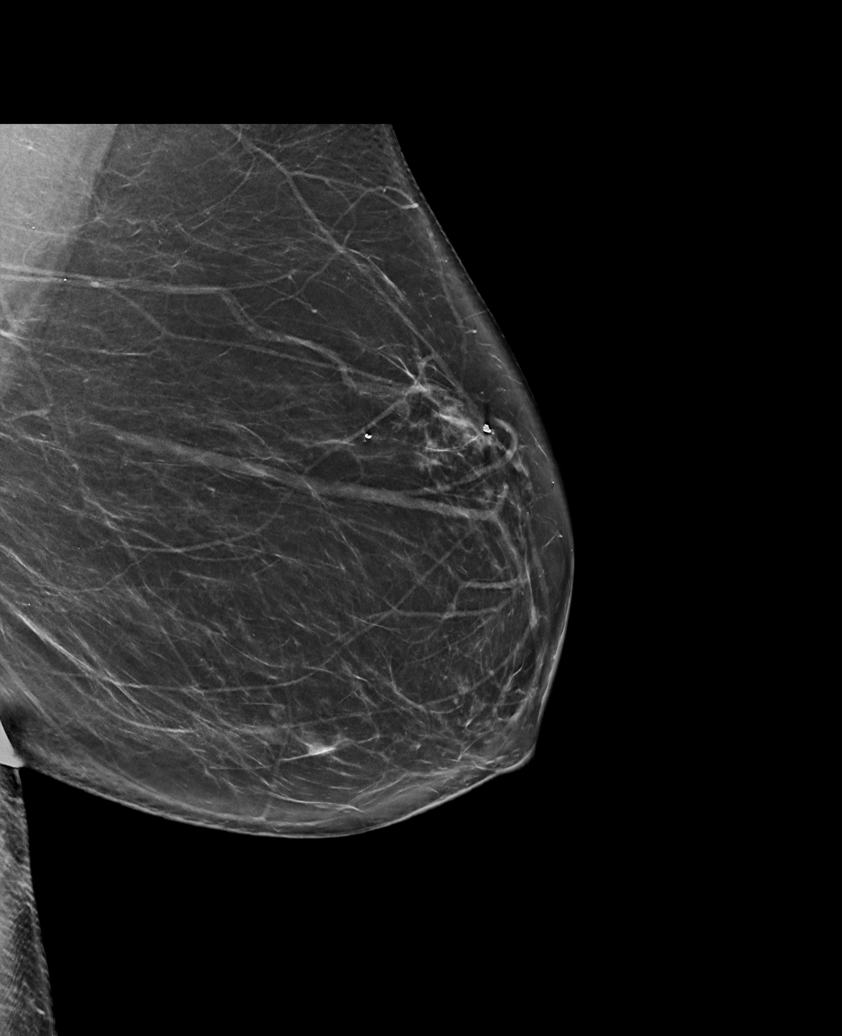

[R MLO synth-2D (2 of 2)]
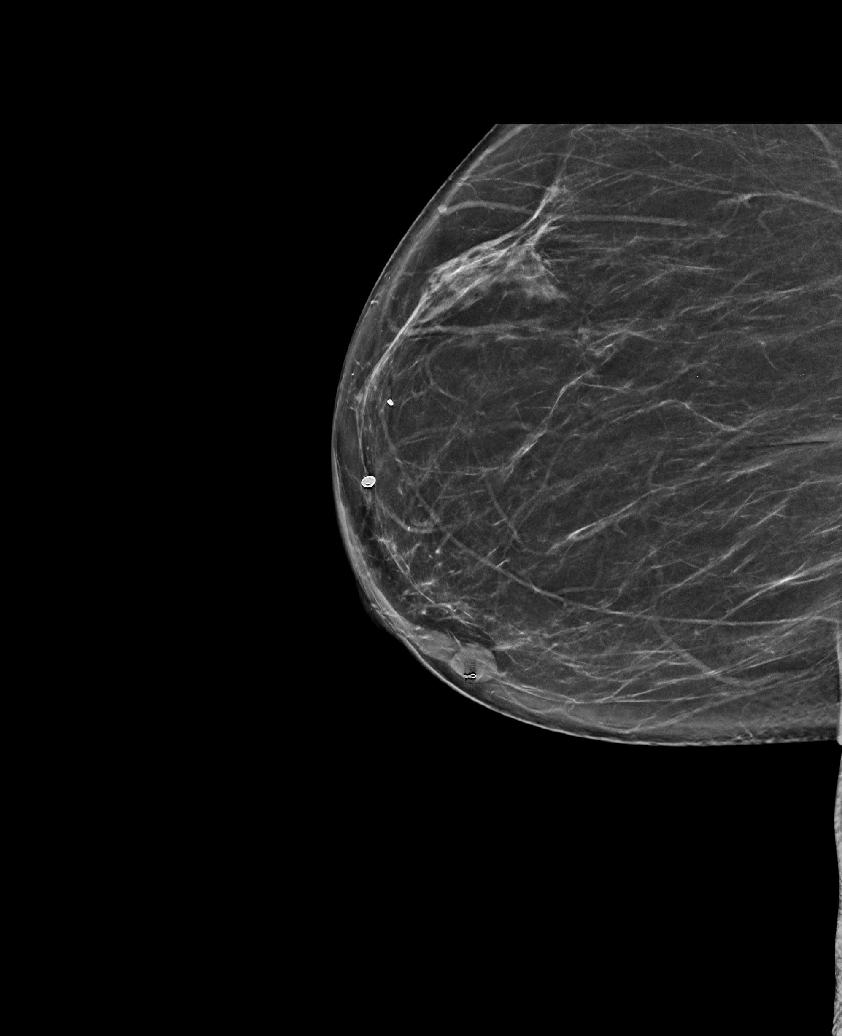

[6 of 36 positions shown; findings below may reference images not displayed]

ACR Breast Density Category b: There are scattered areas of
fibroglandular density.
FINDINGS: There are no findings suspicious for malignancy.
IMPRESSION: No mammographic evidence of malignancy. A result letter of this
screening mammogram will be mailed directly to the patient.

RECOMMENDATION:
Screening mammogram in one year. (Code:51-O-LD2)

BI-RADS CATEGORY  1: Negative.

## 2022-03-25 ENCOUNTER — Ambulatory Visit (INDEPENDENT_AMBULATORY_CARE_PROVIDER_SITE_OTHER): Payer: PPO | Admitting: Vascular Surgery

## 2022-03-25 ENCOUNTER — Ambulatory Visit (HOSPITAL_COMMUNITY)
Admission: RE | Admit: 2022-03-25 | Discharge: 2022-03-25 | Disposition: A | Discharge status: Home / Self Care | Payer: PPO | Source: Ambulatory Visit | Attending: Vascular Surgery | Admitting: Vascular Surgery

## 2022-03-25 ENCOUNTER — Encounter: Payer: Self-pay | Admitting: Vascular Surgery

## 2022-03-25 VITALS — BP 132/69 | HR 67 | Temp 98.3°F | Resp 20 | Ht 61.0 in | Wt 202.7 lb

## 2022-03-25 DIAGNOSIS — K551 Chronic vascular disorders of intestine: Secondary | ICD-10-CM

## 2022-03-25 NOTE — Progress Notes (Signed)
? ?Patient ID: Alicia [name], female   DOB: 1946/04/17, 76 y.o.   MRN: [id number] ? ?Reason for Consult: Follow-up ?  ?Referred by [name], FNP ? ?Subjective:  ?   ?HPI: ? ?Alicia [name] is a 76 y.o. female whom I have seen for carotid disease and leg pain in the past with reflux studies.  She now presents for abdominal pain.  She states that for the past year or 2 she has lost about 35 pounds.  She has pain after eating.  She does not have any food fear.  She states that since a colonoscopy about a year ago she has had intermittent diarrhea and severe gaseous distention.  She has not had any vomiting.  She is planned for colonoscopy and upper endoscopy on June 30 of this year.  She continues on aspirin every other day does have easy bruising of her bilateral upper extremities. ? ?Past Medical History:  ?Diagnosis Date  ? Carotid artery occlusion   ? COPD with acute exacerbation ([place]) 12/26/2021  ? History of hiatal hernia   ? Macular degeneration   ? Sleep apnea   ? ?Family History  ?Problem Relation Age of Onset  ? Heart disease Mother   ? Cerebral aneurysm Father   ? ?Past Surgical History:  ?Procedure Laterality Date  ? APPENDECTOMY    ? CHOLECYSTECTOMY  10/2016  ? EYE SURGERY Bilateral 2017  ? cataracts removed  ? LUMBAR LAMINECTOMY/DECOMPRESSION MICRODISCECTOMY Left 07/21/2021  ? Procedure: Laminectomy and Foraminotomy - L4-L5 - left;  Surgeon: Kary Kos, MD;  Location: [facility];  Service: Neurosurgery;  Laterality: Left;  3C  ? PAROTID GLAND TUMOR EXCISION Left 2003  ? ? ?Short Social History:  ?Social History  ? ?Tobacco Use  ? Smoking status: Every Day  ?  Packs/day: 1.50  ?  Years: 31.00  ?  Pack years: 46.50  ?  Types: Cigarettes  ?  Passive exposure: Current  ? Smokeless tobacco: Never  ?Substance Use Topics  ? Alcohol use: Not Currently  ? ? ?Allergies  ?Allergen Reactions  ? Penicillins Other (See Comments)  ?  Yeast infection ?  ? ? ?Current Outpatient Medications  ?Medication Sig  Dispense Refill  ? acetaminophen (TYLENOL) 500 MG tablet Take 1,500 mg by mouth every 6 (six) hours as needed (pain.).    ? Aflibercept (EYLEA IO) Inject 1 Dose into the eye as directed. Every 6 weeks    ? albuterol (VENTOLIN HFA) 108 (90 Base) MCG/ACT inhaler Inhale 1-2 puffs into the lungs 4 (four) times daily as needed for shortness of breath or wheezing.    ? aspirin EC 81 MG tablet Take 81 mg by mouth every other day. In the morning    ? Budeson-Glycopyrrol-Formoterol (BREZTRI AEROSPHERE) 160-9-4.8 MCG/ACT AERO Inhale 2 puffs into the lungs 2 (two) times daily. 10.7 g 11  ? [employer] TRIPLE MAGNESIUM COMPLEX PO Take by mouth.    ? gabapentin (NEURONTIN) 300 MG capsule TAKE 1 CAPSULE BY MOUTH THREE TIMES A DAY 270 capsule 3  ? Lutein 20 MG TABS Take 20 mg by mouth in the morning.    ? omeprazole (PRILOSEC) 40 MG capsule Take 1 capsule (40 mg total) by mouth daily. (Patient taking differently: Take 40 mg by mouth 2 (two) times daily.) 90 capsule 0  ? pramipexole (MIRAPEX) 0.5 MG tablet Take 0.5 mg 2 (two) times daily by mouth.  1  ? venlafaxine XR (EFFEXOR-XR) 37.5 MG 24 hr capsule Take 1 capsule (37.5  mg total) by mouth at bedtime. 90 capsule 1  ? ?No current facility-administered medications for this visit.  ? ? ?Review of Systems  ?Constitutional:  Constitutional negative. ?HENT: HENT negative.  ?Eyes: Eyes negative.  ?Respiratory: Respiratory negative.  ?GI: Positive for abdominal pain and diarrhea.  ?Musculoskeletal: Positive for leg pain.  ?Skin: Skin negative.  ?Neurological: Neurological negative. ?Hematologic: Positive for bruises/bleeds easily.   ? ?   ?Objective:  ?Objective  ? ?Vitals:  ? 03/25/22 0902  ?BP: 132/69  ?Pulse: 67  ?Resp: 20  ?Temp: 98.3 ?F (36.8 ?C)  ?SpO2: 90%  ?Weight: 202 lb 11.2 oz (91.9 kg)  ?Height: '5\' 1"'$  (1.549 m)  ? ?Body mass index is 38.3 kg/m?. ? ?Physical Exam ?HENT:  ?   Head: Normocephalic.  ?   Nose: Nose normal.  ?Eyes:  ?   Pupils: Pupils are equal, round, and reactive to  light.  ?Neck:  ?   Vascular: No carotid bruit.  ?Cardiovascular:  ?   Rate and Rhythm: Normal rate.  ?   Pulses: Normal pulses.  ?   Heart sounds: Normal heart sounds.  ?Abdominal:  ?   General: Abdomen is flat. Bowel sounds are normal.  ?   Palpations: Abdomen is soft. There is no mass.  ?   Comments: No bruits  ?Musculoskeletal:     ?   General: Normal range of motion.  ?   Cervical back: Normal range of motion and neck supple.  ?   Right lower leg: No edema.  ?   Left lower leg: No edema.  ?Skin: ?   General: Skin is warm and dry.  ?   Capillary Refill: Capillary refill takes less than 2 seconds.  ?Neurological:  ?   General: No focal deficit present.  ?   Mental Status: She is alert.  ?Psychiatric:     ?   Mood and Affect: Mood normal.     ?   Thought Content: Thought content normal.     ?   Judgment: Judgment normal.  ? ? ?Data: ?Duplex Findings:  ?+----------------------+--------+--------+------+------------------------+  ?Mesenteric            PSV cm/sEDV cm/sPlaque        Comments          ?+----------------------+--------+--------+------+------------------------+  ?Aorta at Celiac          72                                           ?+----------------------+--------+--------+------+------------------------+  ?Celiac Artery Origin    276                                           ?+----------------------+--------+--------+------+------------------------+  ?Celiac Artery Proximal  143                 Post stenotic turbulence  ?+----------------------+--------+--------+------+------------------------+  ?SMA Proximal            207                 Post stenotic turbulence  ?+----------------------+--------+--------+------+------------------------+  ?SMA Mid                  70                                           ?+----------------------+--------+--------+------+------------------------+  ?  CHA                     202                 Post stenotic turbulence   ?+----------------------+--------+--------+------+------------------------+  ?Splenic                 156                 Post stenotic turbulence  ?+----------------------+--------+--------+------+------------------------+  ?IMA                                            Could not identify     ?+----------------------+--------+--------+------+------------------------+  ? ?   ? ?   ? ?   ?   ?Summary:  ?Mesenteric:  ?70 to 99% stenosis in the celiac artery and superior mesenteric artery.  ?Limited study secondary to body habitus and bowel gas.  ?    ?Assessment/Plan:  ?  ? 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  We reviewed the CT together.  She will continue aspirin we will plan for aortogram from the right common femoral approach.  We will get this done after June 30 in case we need to place her on Plavix afterwards.  We discussed the risk benefits and alternatives of the procedure and she demonstrates good understanding ?  ? ?Waynetta Sandy MD ?[facility] ? ? ?

## 2022-03-30 ENCOUNTER — Encounter: Payer: Self-pay | Admitting: Family

## 2022-03-30 ENCOUNTER — Encounter: Payer: Self-pay | Admitting: Vascular Surgery

## 2022-03-30 ENCOUNTER — Other Ambulatory Visit: Payer: Self-pay | Admitting: Family

## 2022-03-30 DIAGNOSIS — R110 Nausea: Secondary | ICD-10-CM

## 2022-03-31 ENCOUNTER — Telehealth: Payer: Self-pay | Admitting: Internal Medicine

## 2022-03-31 ENCOUNTER — Other Ambulatory Visit: Payer: Self-pay

## 2022-03-31 DIAGNOSIS — K551 Chronic vascular disorders of intestine: Secondary | ICD-10-CM

## 2022-03-31 NOTE — Telephone Encounter (Signed)
Patient called requesting to speak with nurse. Per patient, has been having diarrhea and gas, then constipation. Patient states using suppository with steroids to reduce swelling of rectum. Patient discussed symptoms with PCP and was advised to speak with [place]. Please advise.

## 2022-03-31 NOTE — Telephone Encounter (Signed)
Dr. Lorenso Courier - please review correspondence below between pt and PCP's office. I am not seeing that we ordered steroidal supp., nor am I seeing that she had mentioned alternating bowel habits resulting in constipation (or as pt describes fecal impaction). I fear the straining must have resulted in inflamed and/or swollen hemorrhoidal tissue (closed anal opening). With all that she is taking OTC, I would have expected a productive outcome. I am not optimistic that a bowel purge (or use of a colonoscopy prep) would be beneficial. Given possible fecal impaction and risk for bowel perforation, was inclined to recommend ED visit. Please review and advise.  Routing below correspondence to Dr. Lorenso Courier for her to further review and advise.  No, I haven't called them.  I will do so today, Tues. ThanksLeyani, Alicia [name] Mar 30, 2022 Alicia Pancoast, FNP to Alicia [name]      5:33 PM Alicia [name],    Looking over your record I see you saw GI on 03/18/2022. Have you reached out to them in regards to this issue? I rather they give you their recommendation, I didn't realize they had already seen you. They might be able to offer more insight as I am not sure you have updated me with the anal opening swelling, and the steroid suppositories.   Last read by Alicia [name] at  4:44 AM on 03/31/2022. Alicia [name] to Alicia [name], Alicia Bach, FNP      4:20 PM For about a week, only a small movement on Fri.  No gas. Haven't tried Miralax,  but have been using [employer] brand of fiber and laxative together. I was packed so hard that I could not get a suppository to get and stay.  Have used one enema, had movement but there is more.  I'm swollen closed at the anal opening, continuing to use steroid suppositories.  Have 3 left. No nausea or vomiting.   Thanks, Alicia [name], Alicia Bach, FNP to [employer]      2:23 PM Alicia [name],    How many days have you been constipated for?  Have you using laxative daily such as  miralax?  You can try enema and or I recommend Magnesium Citrate which you take as a liquid, and it almost guarantees a bowel movement.    I hate that you are suffering so much with this.    Any nausea or vomiting?  Last read by Alicia [name] at  4:44 AM on 03/31/2022.       8:37 AM Alicia [name], CMA routed this conversation to Alicia Pancoast, FNP  Alicia [name] to [name] (supporting Alicia [name], Alicia [name], Alicia [name])      7:11 AM Alicia [name],  I have gone from dirrahea to  constipation (impaction). I used a suppository  with steroid because of the swelling.  I'm taking and eating fiber, laxative,  juice I will pick up enemas today and see if that  helps.  My colonoscopy and endoscopy is June 30.  I hope I don't have to deal with this much longer.   I'm miserable. Any advice?  Surgery is also held off because of procedures.   Alicia [name]

## 2022-04-01 NOTE — Telephone Encounter (Signed)
SECOND ATTEMPT:  Returned pt call. No answer and unable to leave voice message d/t no VM.

## 2022-04-01 NOTE — Telephone Encounter (Signed)
LVM requesting returned call 

## 2022-04-01 NOTE — Telephone Encounter (Signed)
Patient is returning your call asked for a call back by 2pm.

## 2022-04-02 ENCOUNTER — Encounter: Payer: Self-pay | Admitting: Internal Medicine

## 2022-04-02 ENCOUNTER — Ambulatory Visit: Payer: PPO | Admitting: Internal Medicine

## 2022-04-02 DIAGNOSIS — F17210 Nicotine dependence, cigarettes, uncomplicated: Secondary | ICD-10-CM

## 2022-04-02 DIAGNOSIS — J449 Chronic obstructive pulmonary disease, unspecified: Secondary | ICD-10-CM | POA: Diagnosis not present

## 2022-04-02 NOTE — Assessment & Plan Note (Addendum)
Active smoker - 02/05/2022    try breztri 2bid> could not tol but one bid due to mouth irritation  - 04/02/2022  After extensive coaching inhaler device,  effectiveness =   80% (short Ti)    Group D (now reclassified as E) in terms of symptom/risk and laba/lama/ICS  therefore appropriate rx at this point >>>  breztri approp plus prn saba.  Since has problem with full dose breztri rec she just take 2 puff each am  And or use saba prn prior to ex to supplement breztri on the days she plans lots of activities explaining the duration of effect of both meds   Re SABA :  I spent extra time with pt today reviewing appropriate use of albuterol for prn use on exertion with the following points: 1) saba is for relief of sob that does not improve by walking a slower pace or resting but rather if the pt does not improve after trying this first. 2) If the pt is convinced, as many are, that saba helps recover from activity faster then it's easy to tell if this is the case by re-challenging : ie stop, take the inhaler, then p 5 minutes try the exact same activity (intensity of workload) that just caused the symptoms and see if they are substantially diminished or not after saba 3) if there is an activity that reproducibly causes the symptoms, try the saba 15 min before the activity on alternate days   If in fact the saba really does help, then fine to continue to use it prn but advised may need to look closer at the maintenance regimen being used to achieve better control of airways disease with exertion.

## 2022-04-02 NOTE — Assessment & Plan Note (Addendum)
Counseled re importance of smoking cessation but did not meet time criteria for separate billing     >>> f/u in 6  m with pfts in meantime at her convenience     Each maintenance medication was reviewed in detail including emphasizing most importantly the difference between maintenance and prns and under what circumstances the prns are to be triggered using an action plan format where appropriate.  Total time for H and P, chart review, counseling, reviewing hfa  device(s) and generating customized AVS unique to this office visit / same day charting = 31 min

## 2022-04-02 NOTE — Patient Instructions (Addendum)
No change in medications  - ok to try taking both Breztri doses  in am if needed for yardwork  Also Ok to try albuterol 15 min before an activity (on alternating days)  that you know would usually make you short of breath and see if it makes any difference and if makes none then don't take albuterol after activity unless you can't catch your breath as this means it's the resting that helps, not the albuterol.      Please schedule a follow up visit in 6 months but call sooner if needed with pfts in meantime

## 2022-04-02 NOTE — Progress Notes (Signed)
Alicia [name], female    DOB: 09/22/1946    MRN: [id number]   Brief patient profile:  56  yowf  active smoker with croup / whooping cough and pna last around 10-11  referred to pulmonary clinic in [facility]  02/05/2022 by  [name] NP  for ? Copd s/p hairdressor @ [employer] stopped in 2012    and moved to [place] and downhill since with breathing  and needing daily inhaler since 2021.  [facility] eval around 2021  "lung function great"   History of Present Illness  02/05/2022  Pulmonary/ 1st office eval/ Alicia [name] / [facility]  Chief Complaint  Patient presents with   pulmonary consult    Hx of covid 10/2021--c/o sob with exertion, prod cough with clear sputum and wheezing.   Dyspnea:  walks slowly with cane / takes care of yard / iron rx has helped her breathing of late Cough: comes and goes/ min mucoid  Sleep: flat bed one pillow/ cpap x 2003 [place]  SABA use: avg 3 daily  Rec Plan A = Automatic = Always=    Breztri Take 2 puffs first thing in am and then another 2 puffs about 12 hours later.   Work on inhaler technique:  Plan B = Backup (to supplement plan A, not to replace it) Only use your albuterol inhaler as a rescue medicatin Ok to try albuterol 15 min before an activity (on alternating days)  that you know would usually make you short of breath The key is to stop smoking completely before smoking completely stops you!   Please schedule a follow up office visit in 6 weeks, call sooner if needed with all medications /inhalers/ solutions in hand     04/02/2022  f/u ov/Alicia [name]/ [facility] re: AB   maint on Breztri  one bid  / still smoking/ needs sma/celiac bypass may try stenting  Chief Complaint  Patient presents with   Follow-up    Patient feels like she is doing good, Alicia [name] has really helped. No concerns   Dyspnea:  back doing yardwork, walks to [place] up [place] and stops sometimes half way  Cough: no am flares / min am rattle  Sleeping: flat bed on  cpap SABA use: none  02: none  Covid status:  3 vax  and xmas omicron   No obvious day to day or daytime variability or assoc excess/ purulent sputum or mucus plugs or hemoptysis or cp or chest tightness, subjective wheeze or overt sinus or hb symptoms.   Sleeping as above  without nocturnal    exacerbation  of respiratory  c/o's or need for noct saba. Also denies any obvious fluctuation of symptoms with weather or environmental changes or other aggravating or alleviating factors except as outlined above   No unusual exposure hx or h/o childhood pna/ asthma or knowledge of premature birth.  Current Allergies, Complete Past Medical History, Past Surgical History, Family History, and Social History were reviewed in [employer] record.  ROS  The following are not active complaints unless bolded Hoarseness, sore throat, dysphagia, dental problems, itching, sneezing,  nasal congestion or discharge of excess mucus or purulent secretions, ear ache,   fever, chills, sweats, unintended wt loss or wt gain, classically pleuritic or exertional cp,  orthopnea pnd or arm/hand swelling  or leg swelling, presyncope, palpitations, abdominal pain, anorexia, nausea, vomiting, diarrhea  or change in bowel habits or change in bladder habits, change in stools or change in urine,  dysuria, hematuria,  rash, arthralgias, visual complaints, headache, numbness, weakness or ataxia or problems with walking or coordination,  change in mood or  memory.        Current Meds  Medication Sig   acetaminophen (TYLENOL) 500 MG tablet Take 1,500 mg by mouth every 6 (six) hours as needed (pain.).   Aflibercept (EYLEA IO) Inject 1 Dose into the eye as directed. Every 6 weeks   albuterol (VENTOLIN HFA) 108 (90 Base) MCG/ACT inhaler Inhale 1-2 puffs into the lungs 4 (four) times daily as needed for shortness of breath or wheezing.   aspirin EC 81 MG tablet Take 81 mg by mouth every other day. In the morning    Budeson-Glycopyrrol-Formoterol (BREZTRI AEROSPHERE) 160-9-4.8 MCG/ACT AERO Inhale 2 puffs into the lungs 2 (two) times daily.   [employer]S TRIPLE MAGNESIUM COMPLEX PO Take by mouth.   gabapentin (NEURONTIN) 300 MG capsule TAKE 1 CAPSULE BY MOUTH THREE TIMES A DAY   Lutein 20 MG TABS Take 20 mg by mouth in the morning.   omeprazole (PRILOSEC) 40 MG capsule Take 1 capsule (40 mg total) by mouth daily. (Patient taking differently: Take 40 mg by mouth 2 (two) times daily.)   pramipexole (MIRAPEX) 0.5 MG tablet Take 0.5 mg 2 (two) times daily by mouth.   venlafaxine XR (EFFEXOR-XR) 37.5 MG 24 hr capsule Take 1 capsule (37.5 mg total) by mouth at bedtime.       Past Medical History:  Diagnosis Date   Carotid artery occlusion    History of hiatal hernia    Macular degeneration    Sleep apnea          Objective:     Wt Readings from Last 3 Encounters:  04/02/22 206 lb 6.4 oz (93.6 kg)  03/25/22 202 lb 11.2 oz (91.9 kg)  03/18/22 206 lb (93.4 kg)      Vital signs reviewed  04/02/2022  - Note at rest 02 sats  96% on RA   General appearance:    pleasant amb wf nad   HEENT : Oropharynx  clear/ upper denture/ lower partial   Nasal turbintes nl    NECK :  without  appent JVD/ palpable Nodes/TM    LUNGS: no acc muscle use,  Min barrel  contour chest wall with bilateral  slightly decreased bs s audible wheeze and  without cough on insp or exp maneuvers and min  Hyperresonant  to  percussion bilaterally    CV:  RRR  no s3 or murmur or increase in P2, and no edema   ABD:  soft and nontender with pos end  insp Hoover's  in the supine position.  No bruits or organomegaly appreciated   MS:  Nl gait/ ext warm without deformities Or obvious joint restrictions  calf tenderness, cyanosis or clubbing     SKIN: warm and dry without lesions    NEURO:  alert, approp, nl sensorium with  no motor or cerebellar deficits apparent.             I personally reviewed images and agree with radiology  impression as follows:   Chest LDSCT   03/02/22 1. Lung-RADS 2, benign appearance or behavior. Continue annual screening with low-dose chest CT without contrast in 12 months. 2. Coronary artery calcifications. 3. Aortic Atherosclerosis and Emphysema      Assessment

## 2022-04-02 NOTE — Telephone Encounter (Signed)
FINAL ATTEMPT:  Returned pt call. Female answered phone and states pt is on her way to [facility] for her appt. States she will have pt call our office back when she returns home.

## 2022-04-03 ENCOUNTER — Encounter: Payer: Self-pay | Admitting: Family

## 2022-04-03 ENCOUNTER — Other Ambulatory Visit: Payer: Self-pay | Admitting: Family

## 2022-04-03 ENCOUNTER — Encounter: Payer: Self-pay | Admitting: Internal Medicine

## 2022-04-03 DIAGNOSIS — R110 Nausea: Secondary | ICD-10-CM

## 2022-04-06 ENCOUNTER — Encounter: Payer: Self-pay | Admitting: Internal Medicine

## 2022-04-07 ENCOUNTER — Other Ambulatory Visit: Payer: Self-pay | Admitting: Family

## 2022-04-07 DIAGNOSIS — R110 Nausea: Secondary | ICD-10-CM

## 2022-04-07 MED ORDER — OMEPRAZOLE 40 MG PO CPDR: 40.0000 mg | DELAYED_RELEASE_CAPSULE | Freq: Every day | ORAL | 0 refills | Status: DC

## 2022-04-09 ENCOUNTER — Encounter: Payer: Self-pay | Admitting: Vascular Surgery

## 2022-04-14 ENCOUNTER — Ambulatory Visit (INDEPENDENT_AMBULATORY_CARE_PROVIDER_SITE_OTHER): Payer: PPO | Admitting: Dermatology

## 2022-04-14 ENCOUNTER — Encounter: Payer: Self-pay | Admitting: Internal Medicine

## 2022-04-14 DIAGNOSIS — C4492 Squamous cell carcinoma of skin, unspecified: Secondary | ICD-10-CM

## 2022-04-14 DIAGNOSIS — L219 Seborrheic dermatitis, unspecified: Secondary | ICD-10-CM | POA: Diagnosis not present

## 2022-04-14 DIAGNOSIS — L918 Other hypertrophic disorders of the skin: Secondary | ICD-10-CM

## 2022-04-14 DIAGNOSIS — D492 Neoplasm of unspecified behavior of bone, soft tissue, and skin: Secondary | ICD-10-CM

## 2022-04-14 DIAGNOSIS — L820 Inflamed seborrheic keratosis: Secondary | ICD-10-CM | POA: Diagnosis not present

## 2022-04-14 DIAGNOSIS — C44622 Squamous cell carcinoma of skin of right upper limb, including shoulder: Secondary | ICD-10-CM | POA: Diagnosis not present

## 2022-04-14 HISTORY — DX: Squamous cell carcinoma of skin, unspecified: C44.92

## 2022-04-14 MED ORDER — HYDROCORTISONE 2.5 % EX CREA: TOPICAL_CREAM | CUTANEOUS | 3 refills | Status: DC

## 2022-04-14 NOTE — Progress Notes (Signed)
New Patient Visit  Subjective  Alicia [name] is a 76 y.o. female who presents for the following: check spots (R lower leg, ~24m scales over, pt's PCP said it was a skin ca/L cheek, 1-2 yrs, gets scaly,/R calf, scaly spot/Hx of AKs) and rash (Around nose, eyebrows, itches, burns). Also has skin tags that are bothersome. [place]  The patient has spots, moles and lesions to be evaluated, some may be new or changing and the patient has concerns that these could be cancer. Including R forearm.   The following portions of the chart were reviewed this encounter and updated as appropriate:       Review of Systems:  No other skin or systemic complaints except as noted in HPI or Assessment and Plan.  Objective  Well appearing patient in no apparent distress; mood and affect are within normal limits.  A focused examination was performed including face, right lower leg, arms, trunk. Relevant physical exam findings are noted in the Assessment and Plan.  R calf x 1, R lower pretibia x 1, L preauricular x 1, L mandible x 1, R axilla x 2 (6) Stuck on waxy paps with erythema  R mid forearm 6.038mkeratotic pap     BL nasal alar crease Pink scaliness   R inframammary, L inner thigh Fleshy, skin-colored pedunculated papules.      Assessment & Plan  Inflamed seborrheic keratosis (6) R calf x 1, R lower pretibia x 1, L preauricular x 1, L mandible x 1, R axilla x 2  Symptomatic, irritating, patient would like treated.   Destruction of lesion - R calf x 1, R lower pretibia x 1, L preauricular x 1, L mandible x 1, R axilla x 2  Destruction method: cryotherapy   Informed consent: discussed and consent obtained   Lesion destroyed using liquid nitrogen: Yes   Region frozen until ice ball extended beyond lesion: Yes   Outcome: patient tolerated procedure well with no complications   Post-procedure details: wound care instructions given    Additional details:  Prior to procedure, discussed risks of blister formation, small wound, skin dyspigmentation, or rare scar following cryotherapy. Recommend Vaseline ointment to treated areas while healing.   Neoplasm of skin R mid forearm  Epidermal / dermal shaving  Lesion diameter (cm):  0.6 Informed consent: discussed and consent obtained   Patient was prepped and draped in usual sterile fashion: area prepped with alcohol. Anesthesia: the lesion was anesthetized in a standard fashion   Anesthetic:  1% lidocaine w/ epinephrine 1-100,000 buffered w/ 8.4% NaHCO3 Instrument used: flexible razor blade   Hemostasis achieved with: pressure, aluminum chloride and electrodesiccation   Outcome: patient tolerated procedure well    Destruction of lesion  Destruction method: electrodesiccation and curettage   Informed consent: discussed and consent obtained   Timeout:  patient name, date of birth, surgical site, and procedure verified Curettage performed in three different directions: Yes   Electrodesiccation performed over the curetted area: Yes   Lesion length (cm):  0.6 Lesion width (cm):  0.6 Margin per side (cm):  0.1 Final wound size (cm):  0.8 Hemostasis achieved with:  pressure, aluminum chloride and electrodesiccation Outcome: patient tolerated procedure well with no complications   Post-procedure details: wound care instructions given   Additional details:  Mupirocin ointment and Bandaid applied    Specimen 1 - Surgical pathology Differential Diagnosis: D48.5 R/O SCC  Check Margins: No 6.44m844meratotic pap EDC  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 Condition is symptomatic/ bothersome to patient. Not currently at goal.   Seborrheic Dermatitis  -  is a chronic persistent rash characterized by pinkness and scaling most commonly of the mid face but also can occur on the scalp  (dandruff), ears; mid chest, mid back and groin.  It tends to be exacerbated by stress and cooler weather.  People who have neurologic disease may experience new onset or exacerbation of existing seborrheic dermatitis.  The condition is not curable but treatable and can be controlled.  Start HC 2.5% cr qd up to 4x/wk prn flares   Topical steroids (such as triamcinolone, fluocinolone, fluocinonide, mometasone, clobetasol, halobetasol, betamethasone, hydrocortisone) can cause thinning and lightening of the skin if they are used for too long in the same area. Your physician has selected the right strength medicine for your problem and area affected on the body. Please use your medication only as directed by your physician to prevent side effects.    hydrocortisone 2.5 % cream - BL nasal alar crease Apply topically as directed. Apply to itchy rash on face qd up to 4 days a week prn flares  Skin tag R inframammary, L inner thigh  Benign, observe.    Plan snip excision on f/u since symptomatic   Return in about 8 weeks (around 06/09/2022) for Skin tag removal, recheck ISKs and EDC site.  I, Othelia Pulling, RMA, am acting as scribe for Brendolyn Patty, MD .  Documentation: I have reviewed the above documentation for accuracy and completeness, and I agree with the above.  Brendolyn Patty MD

## 2022-04-14 NOTE — Patient Instructions (Addendum)
Wound Care Instructions  Cleanse wound gently with soap and water once a day then pat dry with clean gauze. Apply a thing coat of Petrolatum (petroleum jelly, "Vaseline") over the wound (unless you have an allergy to this). We recommend that you use a new, sterile tube of Vaseline. Do not pick or remove scabs. Do not remove the yellow or white "healing tissue" from the base of the wound.  Cover the wound with fresh, clean, nonstick gauze and secure with paper tape. You may use Band-Aids in place of gauze and tape if the would is small enough, but would recommend trimming much of the tape off as there is often too much. Sometimes Band-Aids can irritate the skin.  You should call the office for your biopsy report after 1 week if you have not already been contacted.  If you experience any problems, such as abnormal amounts of bleeding, swelling, significant bruising, significant pain, or evidence of infection, please call the office immediately.  FOR ADULT SURGERY PATIENTS: If you need something for pain relief you may take 1 extra strength Tylenol (acetaminophen) AND 2 Ibuprofen (200mg each) together every 4 hours as needed for pain. (do not take these if you are allergic to them or if you have a reason you should not take them.) Typically, you may only need pain medication for 1 to 3 days.       Cryotherapy Aftercare  Wash gently with soap and water everyday.   Apply Vaseline and Band-Aid daily until healed.    Due to recent changes in healthcare laws, you may see results of your pathology and/or laboratory studies on MyChart before the doctors have had a chance to review them. We understand that in some cases there may be results that are confusing or concerning to you. Please understand that not all results are received at the same time and often the doctors may need to interpret multiple results in order to provide you with the best plan of care or course of treatment. Therefore, we ask that  you please give us 2 business days to thoroughly review all your results before contacting the office for clarification. Should we see a critical lab result, you will be contacted sooner.   If You Need Anything After Your Visit  If you have any questions or concerns for your doctor, please call our main line at [phone] and press option 4 to reach your doctor's medical assistant. If no one answers, please leave a voicemail as directed and we will return your call as soon as possible. Messages left after 4 pm will be answered the following business day.   You may also send us a message via MyChart. We typically respond to MyChart messages within 1-2 business days.  For prescription refills, please ask your pharmacy to contact our office. Our fax number is [phone].  If you have an urgent issue when the clinic is closed that cannot wait until the next business day, you can page your doctor at the number below.    Please note that while we do our best to be available for urgent issues outside of office hours, we are not available 24/7.   If you have an urgent issue and are unable to reach us, you may choose to seek medical care at your doctor's office, retail clinic, urgent care center, or emergency room.  If you have a medical emergency, please immediately call 911 or go to the emergency department.  Pager Numbers  - Dr. Kowalski:   Dr. Moye: [phone]  - Dr. Stewart: [phone]  In the event of inclement weather, please call our main line at [phone] for an update on the status of any delays or closures.  Dermatology Medication Tips: Please keep the boxes that topical medications come in in order to help keep track of the instructions about where and how to use these. Pharmacies typically print the medication instructions only on the boxes and not directly on the medication tubes.   If your medication is too expensive, please contact our office at  [phone] option 4 or send us a message through MyChart.   We are unable to tell what your co-pay for medications will be in advance as this is different depending on your insurance coverage. However, we may be able to find a substitute medication at lower cost or fill out paperwork to get insurance to cover a needed medication.   If a prior authorization is required to get your medication covered by your insurance company, please allow us 1-2 business days to complete this process.  Drug prices often vary depending on where the prescription is filled and some pharmacies may offer cheaper prices.  The website www.goodrx.com contains coupons for medications through different pharmacies. The prices here do not account for what the cost may be with help from insurance (it may be cheaper with your insurance), but the website can give you the price if you did not use any insurance.  - You can print the associated coupon and take it with your prescription to the pharmacy.  - You may also stop by our office during regular business hours and pick up a GoodRx coupon card.  - If you need your prescription sent electronically to a different pharmacy, notify our office through [place] MyChart or by phone at [phone] option 4.     Si Usted Necesita Algo Despus de Su Visita  Tambin puede enviarnos un mensaje a travs de MyChart. Por lo general respondemos a los mensajes de MyChart en el transcurso de 1 a 2 das hbiles.  Para renovar recetas, por favor pida a su farmacia que se ponga en contacto con nuestra oficina. Nuestro nmero de fax es el [phone].  Si tiene un asunto urgente cuando la clnica est cerrada y que no puede esperar hasta el siguiente da hbil, puede llamar/localizar a su doctor(a) al nmero que aparece a continuacin.   Por favor, tenga en cuenta que aunque hacemos todo lo posible para estar disponibles para asuntos urgentes fuera del horario de oficina, no estamos  disponibles las 24 horas del da, los 7 das de la semana.   Si tiene un problema urgente y no puede comunicarse con nosotros, puede optar por buscar atencin mdica  en el consultorio de su doctor(a), en una clnica privada, en un centro de atencin urgente o en una sala de emergencias.  Si tiene una emergencia mdica, por favor llame inmediatamente al 911 o vaya a la sala de emergencias.  Nmeros de bper  - Dr. Kowalski: [phone]  - Dra. Moye: [phone]  - Dra. Stewart: [phone]  En caso de inclemencias del tiempo, por favor llame a nuestra lnea principal al [phone] para una actualizacin sobre el estado de cualquier retraso o cierre.  Consejos para la medicacin en dermatologa: Por favor, guarde las cajas en las que vienen los medicamentos de uso tpico para ayudarle a seguir las instrucciones sobre dnde y cmo usarlos. Las farmacias generalmente imprimen las instrucciones del medicamento slo en las cajas y   no directamente en los tubos del medicamento.   Si su medicamento es muy caro, por favor, pngase en contacto con nuestra oficina llamando al [phone] y presione la opcin 4 o envenos un mensaje a travs de MyChart.   No podemos decirle cul ser su copago por los medicamentos por adelantado ya que esto es diferente dependiendo de la cobertura de su seguro. Sin embargo, es posible que podamos encontrar un medicamento sustituto a menor costo o llenar un formulario para que el seguro cubra el medicamento que se considera necesario.   Si se requiere una autorizacin previa para que su compaa de seguros cubra su medicamento, por favor permtanos de 1 a 2 das hbiles para completar este proceso.  Los precios de los medicamentos varan con frecuencia dependiendo del lugar de dnde se surte la receta y alguna farmacias pueden ofrecer precios ms baratos.  El sitio web www.goodrx.com tiene cupones para medicamentos de diferentes farmacias. Los precios aqu no  tienen en cuenta lo que podra costar con la ayuda del seguro (puede ser ms barato con su seguro), pero el sitio web puede darle el precio si no utiliz ningn seguro.  - Puede imprimir el cupn correspondiente y llevarlo con su receta a la farmacia.  - Tambin puede pasar por nuestra oficina durante el horario de atencin regular y recoger una tarjeta de cupones de GoodRx.  - Si necesita que su receta se enve electrnicamente a una farmacia diferente, informe a nuestra oficina a travs de MyChart de [place] o por telfono llamando al [phone] y presione la opcin 4.  

## 2022-04-16 ENCOUNTER — Telehealth: Payer: Self-pay

## 2022-04-16 NOTE — Telephone Encounter (Signed)
Pt called 6/5 and 6/8 with questions regarding shoulder injections and whether it would interfere with 7/10 aortagram. Msg forwarded via staff msg to Dr Donzetta Matters. Waiting for reply.

## 2022-04-17 NOTE — Telephone Encounter (Signed)
Sent message to [place] to follow up

## 2022-04-17 NOTE — Telephone Encounter (Signed)
Triage Nurse addressed this.

## 2022-04-19 ENCOUNTER — Emergency Department
Admission: EM | Admit: 2022-04-19 | Discharge: 2022-04-19 | Disposition: A | Discharge status: Home / Self Care | Payer: PPO | Attending: Emergency Medicine | Admitting: Emergency Medicine

## 2022-04-19 ENCOUNTER — Emergency Department: Payer: PPO

## 2022-04-19 ENCOUNTER — Encounter: Payer: Self-pay | Admitting: Emergency Medicine

## 2022-04-19 ENCOUNTER — Other Ambulatory Visit: Payer: Self-pay | Admitting: Family

## 2022-04-19 ENCOUNTER — Other Ambulatory Visit: Payer: Self-pay

## 2022-04-19 DIAGNOSIS — X501XXA Overexertion from prolonged static or awkward postures, initial encounter: Secondary | ICD-10-CM | POA: Insufficient documentation

## 2022-04-19 DIAGNOSIS — S93602A Unspecified sprain of left foot, initial encounter: Secondary | ICD-10-CM | POA: Diagnosis not present

## 2022-04-19 DIAGNOSIS — S99922A Unspecified injury of left foot, initial encounter: Secondary | ICD-10-CM | POA: Diagnosis present

## 2022-04-19 DIAGNOSIS — K219 Gastro-esophageal reflux disease without esophagitis: Secondary | ICD-10-CM

## 2022-04-19 DIAGNOSIS — J449 Chronic obstructive pulmonary disease, unspecified: Secondary | ICD-10-CM | POA: Diagnosis not present

## 2022-04-19 NOTE — ED Triage Notes (Signed)
Pt reports she broke her left foot in 09 and never did have surgery and yesterday she twisted it and now it is painful and swollen.

## 2022-04-19 NOTE — Discharge Instructions (Signed)
Follow-up with your primary care provider if any continued problems with your foot.  Ice and elevation if needed for pain or swelling.  Use cane or walker as needed for added support and prevent falling.

## 2022-04-19 NOTE — ED Provider Notes (Signed)
[facility] Provider Note    Event Date/Time   First MD Initiated Contact with Patient 04/19/22 1417     (approximate)   History   Foot Pain   HPI  Alicia [name] is a 76 y.o. female   presents to the ED with complaint of left foot pain.  Patient states that she had a fractured navicular back in 2009 and when she twisted her foot today it now feels similar to when she had her fracture.  Patient has a history of COPD, carotid artery occlusion, macular degeneration, chronic venous insufficiency, neuropathy lower extremities.      Physical Exam   Triage Vital Signs: ED Triage Vitals  Enc Vitals Group     BP 04/19/22 1352 123/62     Pulse Rate 04/19/22 1352 83     Resp 04/19/22 1352 18     Temp 04/19/22 1352 98.3 F (36.8 C)     Temp Source 04/19/22 1352 Oral     SpO2 04/19/22 1352 95 %     Weight 04/19/22 1348 206 lb 5.6 oz (93.6 kg)     Height 04/19/22 1348 5' (1.524 m)     Head Circumference --      Peak Flow --      Pain Score 04/19/22 1348 8     Pain Loc --      Pain Edu? --      Excl. in [place]? --     Most recent vital signs: Vitals:   04/19/22 1352  BP: 123/62  Pulse: 83  Resp: 18  Temp: 98.3 F (36.8 C)  SpO2: 95%     General: Awake, no distress.  CV:  Good peripheral perfusion.  Resp:  Normal effort.  Abd:  No distention.  Other:  Left foot no gross deformities noted however there is some arthritic changes along with multiple varicosities and spider veins noted.  Skin is intact.  No discoloration present.  There is no point tenderness on palpation as patient states she has neuropathy.  She is able to move all digits distal to her injury, capillary refills less than 3 seconds.   ED Results / Procedures / Treatments   Labs (all labs ordered are listed, but only abnormal results are displayed) Labs Reviewed - No data to display     RADIOLOGY  Left foot x-ray images were reviewed by myself independently of the  radiologist and noted arthritic changes were present.  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   PROCEDURES:  Critical Care performed:   Procedures   MEDICATIONS ORDERED IN ED: Medications - No data to display   IMPRESSION / MDM / [place] / ED COURSE  I reviewed the triage vital signs and the nursing notes.   Differential diagnosis includes, but is not limited to, sprain left foot, fracture left foot, contusion.  76 year old female presents to the ED with complaint of left foot pain after she twisted her foot yesterday.  Patient states that it feels similar to when she broke her navicular in 2009.  Point tenderness is difficult as patient has neuropathy and is unable to identify any specific tenderness.  No gross deformity was noted.  X-ray shows a posttraumatic arthrosis and evidence of a fracture of the navicular.  Patient was made aware that there is no acute fracture but that with twisting her foot this most likely has aggravated her previous injury.  Patient states that she  takes Tylenol for pain as needed and does not require any stronger medication.  She has a cane with her at this time.  She is to follow-up with her PCP if any continued problems or concerns.      Patient's presentation is most consistent with acute complicated illness / injury requiring diagnostic workup.  FINAL CLINICAL IMPRESSION(S) / ED DIAGNOSES   Final diagnoses:  Sprain of left foot, initial encounter     Rx / DC Orders   ED Discharge Orders     None        Note:  This document was prepared using Dragon voice recognition software and may include unintentional dictation errors.   Johnn Hai, PA-C 04/19/22 1457    Carrie Mew, MD 04/19/22 [phone]

## 2022-04-19 NOTE — ED Notes (Signed)
See triage note  presents with pain across the top of left foot  states she did not fall but stepped wrong

## 2022-04-20 ENCOUNTER — Other Ambulatory Visit: Payer: Self-pay

## 2022-04-20 ENCOUNTER — Telehealth: Payer: Self-pay

## 2022-04-20 ENCOUNTER — Encounter: Payer: Self-pay | Admitting: *Deleted

## 2022-04-20 ENCOUNTER — Inpatient Hospital Stay: Payer: PPO | Attending: Oncology

## 2022-04-20 DIAGNOSIS — R110 Nausea: Secondary | ICD-10-CM | POA: Diagnosis not present

## 2022-04-20 DIAGNOSIS — D509 Iron deficiency anemia, unspecified: Secondary | ICD-10-CM

## 2022-04-20 DIAGNOSIS — F17210 Nicotine dependence, cigarettes, uncomplicated: Secondary | ICD-10-CM | POA: Insufficient documentation

## 2022-04-20 DIAGNOSIS — K5900 Constipation, unspecified: Secondary | ICD-10-CM | POA: Diagnosis not present

## 2022-04-20 LAB — CBC WITH DIFFERENTIAL/PLATELET
Abs Immature Granulocytes: 0.02 10*3/uL (ref 0.00–0.07)
Basophils Absolute: 0 10*3/uL (ref 0.0–0.1)
Basophils Relative: 1 %
Eosinophils Absolute: 0.2 10*3/uL (ref 0.0–0.5)
Eosinophils Relative: 3 %
HCT: 36 % (ref 36.0–46.0)
Hemoglobin: 11.2 g/dL — ABNORMAL LOW (ref 12.0–15.0)
Immature Granulocytes: 0 %
Lymphocytes Relative: 25 %
Lymphs Abs: 2 10*3/uL (ref 0.7–4.0)
MCH: 25.7 pg — ABNORMAL LOW (ref 26.0–34.0)
MCHC: 31.1 g/dL (ref 30.0–36.0)
MCV: 82.6 fL (ref 80.0–100.0)
Monocytes Absolute: 0.4 10*3/uL (ref 0.1–1.0)
Monocytes Relative: 5 %
Neutro Abs: 5.3 10*3/uL (ref 1.7–7.7)
Neutrophils Relative %: 66 %
Platelets: 226 10*3/uL (ref 150–400)
RBC: 4.36 MIL/uL (ref 3.87–5.11)
RDW: 18.6 % — ABNORMAL HIGH (ref 11.5–15.5)
WBC: 7.9 10*3/uL (ref 4.0–10.5)
nRBC: 0 % (ref 0.0–0.2)

## 2022-04-20 LAB — FERRITIN: Ferritin: 5 ng/mL — ABNORMAL LOW (ref 11–307)

## 2022-04-20 LAB — IRON AND TIBC
Iron: 28 ug/dL (ref 28–170)
Saturation Ratios: 7 % — ABNORMAL LOW (ref 10.4–31.8)
TIBC: 430 ug/dL (ref 250–450)
UIBC: 402 ug/dL

## 2022-04-20 NOTE — Telephone Encounter (Signed)
Discussed biopsy with pt SCC skin cancer- already treated with EDC at time of biopsy,

## 2022-04-20 NOTE — Telephone Encounter (Signed)
Left pt msg to call for bx results/sh 

## 2022-04-20 NOTE — Telephone Encounter (Signed)
-----   Message from Brendolyn Patty, MD sent at 04/20/2022  1:35 PM EDT ----- Skin , right mid forearm SQUAMOUS CELL CARCINOMA, KERATOACANTHOMA TYPE, FEATURES OF REGRESSION  SCC skin cancer- already treated with EDC at time of biopsy    - please call patient

## 2022-04-21 ENCOUNTER — Inpatient Hospital Stay (HOSPITAL_BASED_OUTPATIENT_CLINIC_OR_DEPARTMENT_OTHER): Payer: PPO | Admitting: Oncology

## 2022-04-21 ENCOUNTER — Inpatient Hospital Stay: Payer: PPO

## 2022-04-21 ENCOUNTER — Encounter: Payer: Self-pay | Admitting: Oncology

## 2022-04-21 VITALS — BP 143/84 | HR 81 | Temp 97.5°F | Resp 16 | Ht 60.0 in | Wt 203.0 lb

## 2022-04-21 VITALS — BP 137/79 | HR 80

## 2022-04-21 DIAGNOSIS — D509 Iron deficiency anemia, unspecified: Secondary | ICD-10-CM | POA: Diagnosis not present

## 2022-04-21 MED ORDER — SODIUM CHLORIDE 0.9 % IV SOLN
INTRAVENOUS | Status: DC
  Filled 2022-04-21: qty 250

## 2022-04-21 MED ORDER — IRON SUCROSE 20 MG/ML IV SOLN
200.0000 mg | Freq: Once | INTRAVENOUS | Status: AC
  Administered 2022-04-21: 200 mg via INTRAVENOUS

## 2022-04-21 MED ORDER — SODIUM CHLORIDE 0.9 % IV SOLN: 200.0000 mg | Freq: Once | INTRAVENOUS | Status: DC

## 2022-04-21 NOTE — Progress Notes (Signed)
[facility]  [phone] [phone]  ID: Alicia [name] OB: 1946-06-18  MR#: [id number]  XBL#:[id number]  Patient Care Team: [name], FNP as PCP - General (Family Medicine)  CHIEF COMPLAINT: Iron deficiency anemia.  INTERVAL HISTORY: Patient returns to clinic today for repeat laboratory work, further evaluation, and consideration of additional IV Venofer.  She continues to have multiple GI issues including constipation and nausea that is actively being worked up by gastroenterology.  She otherwise feels well.  Her weakness and fatigue have improved.  Has no neurologic complaints.  She denies any recent fevers or illnesses.  She has a good appetite and denies weight loss.  She has no chest pain, shortness of breath, cough, or hemoptysis.  She denies any nausea, vomiting, or constipation.  But does admit to periodic diarrhea.  She has no melena or hematochezia.  She has no urinary complaints.  Patient offers no further specific complaints today.  REVIEW OF SYSTEMS:   Review of Systems  Constitutional: Negative.  Negative for fever, malaise/fatigue and weight loss.  Respiratory: Negative.  Negative for cough, hemoptysis and shortness of breath.   Cardiovascular: Negative.  Negative for chest pain and leg swelling.  Gastrointestinal:  Positive for constipation and nausea. Negative for abdominal pain, blood in stool, diarrhea and melena.  Genitourinary: Negative.  Negative for hematuria.  Musculoskeletal: Negative.  Negative for back pain.  Skin: Negative.  Negative for rash.  Neurological: Negative.  Negative for dizziness, focal weakness, weakness and headaches.    As per HPI. Otherwise, a complete review of systems is negative.  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   R mid forearm, [facility]    PAST SURGICAL HISTORY: Past Surgical History:  Procedure Laterality Date   APPENDECTOMY     CHOLECYSTECTOMY  10/2016   EYE SURGERY Bilateral 2017   cataracts removed   LUMBAR LAMINECTOMY/DECOMPRESSION MICRODISCECTOMY Left 07/21/2021   Procedure: Laminectomy and Foraminotomy - L4-L5 - left;  Surgeon: Kary Kos, MD;  Location: [facility];  Service: Neurosurgery;  Laterality: Left;  3C   PAROTID GLAND TUMOR EXCISION Left 2003    FAMILY HISTORY: Family History  Problem Relation Age of Onset   Heart disease Mother    Cerebral aneurysm Father     ADVANCED DIRECTIVES (Y/N):  N  HEALTH MAINTENANCE: Social History   Tobacco Use   Smoking status: Every Day    Packs/day: 1.50    Years: 31.00    Total pack years: 46.50    Types: Cigarettes    Passive exposure: Current   Smokeless tobacco: Never   Tobacco comments:    Smokes a pack and a half a day [facility] 04/02/22  Vaping Use   Vaping Use: Never used  Substance Use Topics   Alcohol use: Not Currently   Drug use: Never     Colonoscopy:  PAP:  Bone density:  Lipid panel:  Allergies  Allergen Reactions   Penicillins Other (See Comments)    Yeast infection     Current Outpatient Medications  Medication Sig Dispense Refill   acetaminophen (TYLENOL) 500 MG tablet Take 1,500 mg by mouth every 6 (six) hours as needed (pain.).     albuterol (VENTOLIN HFA) 108 (90 Base) MCG/ACT inhaler Inhale 1-2  puffs into the lungs 4 (four) times daily as needed for shortness of breath or wheezing.     aspirin EC 81 MG tablet Take 81 mg by mouth every other day. In the morning     Budeson-Glycopyrrol-Formoterol (BREZTRI AEROSPHERE) 160-9-4.8 MCG/ACT AERO Inhale 2 puffs into the lungs 2 (two) times daily. 10.7 g 11   Cholecalciferol (VITAMIN D3) 125 MCG (5000 UT) CAPS Take by mouth.     [employer] TRIPLE MAGNESIUM COMPLEX PO Take by mouth.     faricimab-svoa (VABYSMO) 6 MG/0.05ML SOLN  intravitreal injection 6 mg by Intravitreal route once.     gabapentin (NEURONTIN) 300 MG capsule TAKE 1 CAPSULE BY MOUTH THREE TIMES A DAY 270 capsule 3   hydrocortisone 2.5 % cream Apply topically as directed. Apply to itchy rash on face qd up to 4 days a week prn flares 30 g 3   Lutein 20 MG TABS Take 20 mg by mouth in the morning.     omeprazole (PRILOSEC) 40 MG capsule Take 1 capsule (40 mg total) by mouth daily. 90 capsule 0   pramipexole (MIRAPEX) 0.5 MG tablet Take 0.5 mg 2 (two) times daily by mouth.  1   venlafaxine XR (EFFEXOR-XR) 37.5 MG 24 hr capsule Take 1 capsule (37.5 mg total) by mouth at bedtime. 90 capsule 1   Aflibercept (EYLEA IO) Inject 1 Dose into the eye as directed. Every 6 weeks (Patient not taking: Reported on 04/21/2022)     No current facility-administered medications for this visit.    OBJECTIVE: Vitals:   04/21/22 1310  BP: (!) 143/84  Pulse: 81  Resp: 16  Temp: (!) 97.5 F (36.4 C)  SpO2: 97%     Body mass index is 39.65 kg/m.    ECOG FS:1 - Symptomatic but completely ambulatory  General: Well-developed, well-nourished, no acute distress. Eyes: Pink conjunctiva, anicteric sclera. HEENT: Normocephalic, moist mucous membranes. Lungs: No audible wheezing or coughing. Heart: Regular rate and rhythm. Abdomen: Soft, nontender, no obvious distention. Musculoskeletal: No edema, cyanosis, or clubbing. Neuro: Alert, answering all questions appropriately. Cranial nerves grossly intact. Skin: No rashes or petechiae noted. Psych: Normal affect.   LAB RESULTS:  Lab Results  Component Value Date   NA 135 01/13/2022   K 3.9 01/13/2022   CL 102 01/13/2022   CO2 25 01/13/2022   GLUCOSE 93 01/13/2022   BUN 14 01/13/2022   CREATININE 0.70 03/05/2022   CALCIUM 9.9 01/13/2022   PROT 8.2 01/13/2022   ALBUMIN 4.4 01/13/2022   AST 17 01/13/2022   ALT 10 01/13/2022   ALKPHOS 112 01/13/2022   BILITOT 0.4 01/13/2022   GFRNONAA >60 07/10/2021    Lab Results   Component Value Date   WBC 7.9 04/20/2022   NEUTROABS 5.3 04/20/2022   HGB 11.2 (L) 04/20/2022   HCT 36.0 04/20/2022   MCV 82.6 04/20/2022   PLT 226 04/20/2022   Lab Results  Component Value Date   IRON 28 04/20/2022   TIBC 430 04/20/2022   IRONPCTSAT 7 (L) 04/20/2022   Lab Results  Component Value Date   FERRITIN 5 (L) 04/20/2022     STUDIES: DG Foot Complete Left  Result Date: 04/19/2022 CLINICAL DATA:  Twisting injury yesterday, pain and swelling, remote history of navicular fracture EXAM: LEFT FOOT - COMPLETE 3+ VIEW COMPARISON:  None Available. FINDINGS: No acute fracture or dislocation of the left foot. 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 Soft tissues unremarkable. IMPRESSION: 1. No acute fracture or dislocation of the left foot. 2. 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 Electronically Signed   By: Delanna Ahmadi M.D.   On: 04/19/2022 14:36   VAS [place] MESENTERIC DUPLEX  Result Date: 03/25/2022 ABDOMINAL VISCERAL Patient Name:  Alicia [name]  Date of Exam:   03/25/2022 Medical Rec #: [id number]            Accession #:    [id number] Date of Birth: 1946-05-29           Patient Gender: F Patient Age:   34 years Exam Location:  [facility] Procedure:      VAS [place] MESENTERIC Referring Phys: [id number] [place] -------------------------------------------------------------------------------- High Risk Factors: Hyperlipidemia, current smoker. Other Factors: Celiac artery compression syndrome. Limitations: Patient discomfort, obesity and air/bowel gas. 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 Bilateral testing is considered an integral part of a complete examination. Limited examinations for reoccurring indications may be performed as noted.  Duplex Findings: +----------------------+--------+--------+------+------------------------+ Mesenteric            PSV cm/sEDV cm/sPlaque        Comments         +----------------------+--------+--------+------+------------------------+ Aorta at Celiac          72                                          +----------------------+--------+--------+------+------------------------+ Celiac Artery Origin    276                                          +----------------------+--------+--------+------+------------------------+ Celiac Artery Proximal  143                 Post stenotic turbulence +----------------------+--------+--------+------+------------------------+ SMA Proximal            207                 Post stenotic turbulence +----------------------+--------+--------+------+------------------------+ SMA Mid                  70                                          +----------------------+--------+--------+------+------------------------+ CHA                     202                 Post stenotic turbulence +----------------------+--------+--------+------+------------------------+ Splenic                 156  Post stenotic turbulence +----------------------+--------+--------+------+------------------------+ IMA                                            Could not identify    +----------------------+--------+--------+------+------------------------+    Summary: 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 Limited study secondary to body  habitus and bowel gas.  *See table(s) above for measurements and observations.  Diagnosing physician: Servando Snare MD  Electronically signed by Servando Snare MD on 03/25/2022 at 9:21:38 AM.    Final     ASSESSMENT: Iron deficiency anemia.  PLAN:    1.  Iron deficiency anemia: Likely related to her ongoing GI issues.  Patient's hemoglobin has improved to 11.2, but her iron stores remain reduced.  Proceed with 200 mg IV Venofer today.  Return to clinic 2 additional times over the next week for additional IV iron.  Patient will then return to clinic in 3 months with repeat laboratory work, further evaluation, and continuation of treatment.   2.  Constipation/nausea: Continue evaluation and treatment per GI.  I spent a total of 30 minutes reviewing chart data, face-to-face evaluation with the patient, counseling and coordination of care as detailed above.   Patient expressed understanding and was in agreement with this plan. She also understands that She can call clinic at any time with any questions, concerns, or complaints.    Lloyd Huger, MD   04/21/2022 1:41 PM

## 2022-04-21 NOTE — Addendum Note (Signed)
Addended by: Delice Bison E on: 04/21/2022 03:40 PM   Modules accepted: Orders

## 2022-04-23 ENCOUNTER — Encounter: Payer: Self-pay | Admitting: Family

## 2022-04-24 ENCOUNTER — Encounter: Payer: Self-pay | Admitting: Oncology

## 2022-04-27 ENCOUNTER — Encounter: Payer: Self-pay | Admitting: Internal Medicine

## 2022-04-28 ENCOUNTER — Inpatient Hospital Stay: Payer: PPO

## 2022-04-28 VITALS — BP 140/84 | HR 99 | Temp 98.4°F | Resp 18

## 2022-04-28 DIAGNOSIS — D509 Iron deficiency anemia, unspecified: Secondary | ICD-10-CM | POA: Diagnosis not present

## 2022-04-28 MED ORDER — IRON SUCROSE 20 MG/ML IV SOLN
200.0000 mg | Freq: Once | INTRAVENOUS | Status: AC
  Administered 2022-04-28: 200 mg via INTRAVENOUS
  Filled 2022-04-28: qty 10

## 2022-04-28 MED ORDER — SODIUM CHLORIDE 0.9 % IV SOLN: 200.0000 mg | Freq: Once | INTRAVENOUS | Status: DC

## 2022-04-28 NOTE — Patient Instructions (Signed)
[facility]  Discharge Instructions: Thank you for choosing [facility] to provide your oncology and hematology care.  If you have a lab appointment with the Cancer Center, please go directly to the Cancer Center and check in at the registration area.  Wear comfortable clothing and clothing appropriate for easy access to any Portacath or PICC line.   We strive to give you quality time with your provider. You may need to reschedule your appointment if you arrive late (15 or more minutes).  Arriving late affects you and other patients whose appointments are after yours.  Also, if you miss three or more appointments without notifying the office, you may be dismissed from the clinic at the provider's discretion.      For prescription refill requests, have your pharmacy contact our office and allow 72 hours for refills to be completed.    Today you received the following chemotherapy and/or immunotherapy agents VENOFER      To help prevent nausea and vomiting after your treatment, we encourage you to take your nausea medication as directed.  BELOW ARE SYMPTOMS THAT SHOULD BE REPORTED IMMEDIATELY: *FEVER GREATER THAN 100.4 F (38 C) OR HIGHER *CHILLS OR SWEATING *NAUSEA AND VOMITING THAT IS NOT CONTROLLED WITH YOUR NAUSEA MEDICATION *UNUSUAL SHORTNESS OF BREATH *UNUSUAL BRUISING OR BLEEDING *URINARY PROBLEMS (pain or burning when urinating, or frequent urination) *BOWEL PROBLEMS (unusual diarrhea, constipation, pain near the anus) TENDERNESS IN MOUTH AND THROAT WITH OR WITHOUT PRESENCE OF ULCERS (sore throat, sores in mouth, or a toothache) UNUSUAL RASH, SWELLING OR PAIN  UNUSUAL VAGINAL DISCHARGE OR ITCHING   Items with * indicate a potential emergency and should be followed up as soon as possible or go to the Emergency Department if any problems should occur.  Please show the CHEMOTHERAPY ALERT CARD or IMMUNOTHERAPY ALERT CARD at check-in to the  Emergency Department and triage nurse.  Should you have questions after your visit or need to cancel or reschedule your appointment, please contact [facility]  [phone] and follow the prompts.  Office hours are 8:00 a.m. to 4:30 p.m. Monday - Friday. Please note that voicemails left after 4:00 p.m. may not be returned until the following business day.  We are closed weekends and major holidays. You have access to a nurse at all times for urgent questions. Please call the main number to the clinic [phone] and follow the prompts.  For any non-urgent questions, you may also contact your provider using MyChart. We now offer e-Visits for anyone 18 and older to request care online for non-urgent symptoms. For details visit [email or web address].   Also download the MyChart app! Go to the app store, search "MyChart", open the app, select [facility], and log in with your MyChart username and password.  Masks are optional in the cancer centers. If you would like for your care team to wear a mask while they are taking care of you, please let them know. For doctor visits, patients may have with them one support person who is at least 77 years old. At this time, visitors are not allowed in the infusion area.   Iron Sucrose Injection What is this medication? IRON SUCROSE (EYE ern SOO krose) treats low levels of iron (iron deficiency anemia) in people with kidney disease. Iron is a mineral that plays an important role in making red blood cells, which carry oxygen from your lungs to the rest of your body. This medicine may   be used for other purposes; ask your health care provider or pharmacist if you have questions. COMMON BRAND NAME(S): Venofer What should I tell my care team before I take this medication? They need to know if you have any of these conditions: Anemia not caused by low iron levels Heart disease High levels of iron in the blood Kidney disease Liver  disease An unusual or allergic reaction to iron, other medications, foods, dyes, or preservatives Pregnant or trying to get pregnant Breast-feeding How should I use this medication? This medication is for infusion into a vein. It is given in a hospital or clinic setting. Talk to your care team about the use of this medication in children. While this medication may be prescribed for children as young as 2 years for selected conditions, precautions do apply. Overdosage: If you think you have taken too much of this medicine contact a poison control center or emergency room at once. NOTE: This medicine is only for you. Do not share this medicine with others. What if I miss a dose? It is important not to miss your dose. Call your care team if you are unable to keep an appointment. What may interact with this medication? Do not take this medication with any of the following: Deferoxamine Dimercaprol Other iron products This medication may also interact with the following: Chloramphenicol Deferasirox This list may not describe all possible interactions. Give your health care provider a list of all the medicines, herbs, non-prescription drugs, or dietary supplements you use. Also tell them if you smoke, drink alcohol, or use illegal drugs. Some items may interact with your medicine. What should I watch for while using this medication? Visit your care team regularly. Tell your care team if your symptoms do not start to get better or if they get worse. You may need blood work done while you are taking this medication. You may need to follow a special diet. Talk to your care team. Foods that contain iron include: whole grains/cereals, dried fruits, beans, or peas, leafy green vegetables, and organ meats (liver, kidney). What side effects may I notice from receiving this medication? Side effects that you should report to your care team as soon as possible: Allergic reactions--skin rash, itching, hives,  swelling of the face, lips, tongue, or throat Low blood pressure--dizziness, feeling faint or lightheaded, blurry vision Shortness of breath Side effects that usually do not require medical attention (report to your care team if they continue or are bothersome): Flushing Headache Joint pain Muscle pain Nausea Pain, redness, or irritation at injection site This list may not describe all possible side effects. Call your doctor for medical advice about side effects. You may report side effects to FDA at 1-800-FDA-1088. Where should I keep my medication? This medication is given in a hospital or clinic and will not be stored at home. NOTE: This sheet is a summary. It may not cover all possible information. If you have questions about this medicine, talk to your doctor, pharmacist, or health care provider.  2023 Elsevier/Gold Standard (2021-03-21 00:00:00)   

## 2022-04-29 ENCOUNTER — Ambulatory Visit (INDEPENDENT_AMBULATORY_CARE_PROVIDER_SITE_OTHER): Payer: PPO | Admitting: Surgical

## 2022-04-29 ENCOUNTER — Encounter: Payer: Self-pay | Admitting: Orthopedic Surgery

## 2022-04-29 DIAGNOSIS — M19012 Primary osteoarthritis, left shoulder: Secondary | ICD-10-CM | POA: Diagnosis not present

## 2022-04-29 DIAGNOSIS — M7551 Bursitis of right shoulder: Secondary | ICD-10-CM | POA: Diagnosis not present

## 2022-04-29 DIAGNOSIS — M25511 Pain in right shoulder: Secondary | ICD-10-CM

## 2022-04-29 MED ORDER — LIDOCAINE HCL 1 % IJ SOLN
5.0000 mL | INTRAMUSCULAR | Status: AC | PRN
  Administered 2022-04-29: 5 mL

## 2022-04-29 MED ORDER — METHYLPREDNISOLONE ACETATE 40 MG/ML IJ SUSP
40.0000 mg | INTRAMUSCULAR | Status: AC | PRN
  Administered 2022-04-29: 40 mg via INTRA_ARTICULAR

## 2022-04-29 MED ORDER — BUPIVACAINE HCL 0.5 % IJ SOLN
9.0000 mL | INTRAMUSCULAR | Status: AC | PRN
  Administered 2022-04-29: 9 mL via INTRA_ARTICULAR

## 2022-04-29 NOTE — Progress Notes (Signed)
Office Visit Note   Patient: Alicia [name]           Date of Birth: 1946/08/06           MRN: [id number] Visit Date: 04/29/2022 Requested by: [name], [place],   [place] [address] PCP: [name], FNP  Subjective: Chief Complaint  Patient presents with   Left Shoulder - Pain   Right Shoulder - Pain    HPI: Alicia [name] is a 76 y.o. female who presents to the office complaining of bilateral shoulder pain.  Patient returns following right subacromial injection and left glenohumeral injection on 01/12/2022.  She states that both injections helped her shoulder pain significantly.  She would like to repeat both injections today.  No new injuries to either shoulder.  She does note continued right shoulder weakness and pain in both shoulders that wakes her up at night.  She would like to consider all the options regarding the right shoulder, including surgery.  She does have upcoming vascular surgery scheduled for early July so she would not want to have any shoulder surgery done until likely fall 2023.  She describes primarily pain in the lateral aspect of the right shoulder that travels down to her elbow but she occasionally does have pain that travels into her wrist.                 ROS: All systems reviewed are negative as they relate to the chief complaint within the history of present illness.  Patient denies fevers or chills.  Assessment & Plan: Visit Diagnoses:  1. Right shoulder pain, unspecified chronicity   2. Primary osteoarthritis, left shoulder   3. Bursitis of right shoulder     Plan: Patient is a 76 year old female who presents for evaluation of bilateral shoulder pain.  She has history of left shoulder glenohumeral arthritis that is doing well with occasional injections.  She would like to repeat this today and left shoulder glenohumeral injection was successfully administered.  Regarding her right shoulder, she has a history of  normal radiographs of the shoulder in September 2021 but she has had multiple physical exams with right shoulder rotator cuff weakness noted.  She has never wanted to do any advanced imaging in order to work this up but she has decided today that she would like to consider all of her options regarding her right shoulder.  As such, right shoulder subacromial injection administered today and MRI arthrogram of the right shoulder was ordered to evaluate for rotator cuff tear given her weakness.  She may also have some contribution of cervical spine radicular pain given the extension of her pain down to her wrist at times.  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  Most of her symptoms do improve with subacromial injection however.  Follow-up after MRI to review results.  Follow-Up Instructions: No follow-ups on file.   Orders:  Orders Placed This Encounter  Procedures   MR SHOULDER RIGHT W CONTRAST   Arthrogram   No orders of the defined types were placed in this encounter.     Procedures: Large Joint Inj: L glenohumeral on 04/29/2022 8:42 PM Indications: diagnostic evaluation and pain Details: 18 G 1.5 in needle, posterior approach  Arthrogram: No  Medications: 9 mL bupivacaine 0.5 %; 40 mg methylPREDNISolone acetate 40 MG/ML; 5 mL lidocaine 1 % Outcome:  tolerated well, no immediate complications Procedure, treatment alternatives, risks and benefits explained, specific risks discussed. Consent was given by the patient. Immediately prior to procedure a time out was called to verify the correct patient, procedure, equipment, support staff and site/side marked as required. Patient was prepped and draped in the usual sterile fashion.    Large Joint Inj: R subacromial bursa on 04/29/2022 8:43 PM Indications: diagnostic evaluation and pain Details: 18 G 1.5 in needle, posterior  approach  Arthrogram: No  Medications: 9 mL bupivacaine 0.5 %; 40 mg methylPREDNISolone acetate 40 MG/ML; 5 mL lidocaine 1 % Outcome: tolerated well, no immediate complications Procedure, treatment alternatives, risks and benefits explained, specific risks discussed. Consent was given by the patient. Immediately prior to procedure a time out was called to verify the correct patient, procedure, equipment, support staff and site/side marked as required. Patient was prepped and draped in the usual sterile fashion.       Clinical Data: No additional findings.  Objective: Vital Signs: There were no vitals taken for this visit.  Physical Exam:  Constitutional: Patient appears well-developed HEENT:  Head: Normocephalic Eyes:EOM are normal Neck: Normal range of motion Cardiovascular: Normal rate Pulmonary/chest: Effort normal Neurologic: Patient is alert Skin: Skin is warm Psychiatric: Patient has normal mood and affect  Ortho Exam: Ortho exam demonstrates right shoulder with 40 degrees external rotation, 95 degrees abduction, 160 degrees forward flexion.  Active range of motion equivalent to passive range of motion.  4/5 external rotation weakness.  5 -/5 supraspinatus strength of the right shoulder, 5/5 subscapularis strength.  All rotator cuff strength testing reproduces her pain.  Mild tenderness over the axial cervical spine.  Specialty Comments:  No specialty comments available.  Imaging: No results found.   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   Acute upper abdominal pain 02/13/2022   Epigastric abdominal tenderness with rebound tenderness 02/13/2022   Nausea 02/13/2022   Centrilobular emphysema ([place]) 02/13/2022   Cigarette smoker 02/06/2022   Asthmatic bronchitis , chronic ([place]) 02/05/2022   Incidental pulmonary nodule, greater than or equal to 75m 01/21/2022   Abnormal thyroid function  test 01/21/2022   Positive fecal occult blood test 01/19/2022   Epigastric pain 01/19/2022   Diarrhea 01/19/2022   Other fatigue 01/19/2022   Bilateral carotid artery stenosis 12/26/2021   Iron deficiency anemia 12/26/2021   Mixed hyperlipidemia 12/26/2021   Chronic cough 12/26/2021   Adjustment reaction with anxiety and depression 12/26/2021   Chronic venous insufficiency 02/29/2020   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/05/2019   GERD (gastroesophageal reflux disease) 06/08/2018   Macular degeneration 06/08/2018   Sleep apnea 06/08/2018   Past Medical History:  Diagnosis Date   Carotid artery occlusion    [place]   History of hiatal hernia    Macular degeneration    Sleep apnea    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   R mid forearm, [facility]    Family History  Problem Relation Age of Onset   Heart disease Mother    Cerebral aneurysm Father     Past Surgical History:  Procedure Laterality Date   APPENDECTOMY     CHOLECYSTECTOMY  10/2016   EYE SURGERY Bilateral 2017   cataracts removed   LUMBAR LAMINECTOMY/DECOMPRESSION MICRODISCECTOMY Left 07/21/2021   Procedure: Laminectomy and Foraminotomy - L4-L5 - left;  Surgeon: CKary Kos MD;  [facility] [facility]  Service: Neurosurgery;  Laterality: Left;  3C   PAROTID GLAND TUMOR EXCISION Left 2003   Social History   Occupational History   Not on file  Tobacco Use   Smoking status: Every Day    Packs/day: 1.50    Years: 31.00    Total pack years: 46.50    Types: Cigarettes    Passive exposure: Current   Smokeless tobacco: Never   Tobacco comments:    Smokes a pack and a half a day [facility] 04/02/22  Vaping Use   Vaping Use: Never used  Substance and Sexual Activity   Alcohol use: Not Currently   Drug use: Never   Sexual activity: Not Currently

## 2022-04-30 ENCOUNTER — Inpatient Hospital Stay: Payer: PPO

## 2022-04-30 VITALS — BP 149/67 | HR 78 | Temp 97.6°F | Resp 18

## 2022-04-30 DIAGNOSIS — D509 Iron deficiency anemia, unspecified: Secondary | ICD-10-CM | POA: Diagnosis not present

## 2022-04-30 MED ORDER — IRON SUCROSE 20 MG/ML IV SOLN
200.0000 mg | Freq: Once | INTRAVENOUS | Status: AC
  Administered 2022-04-30: 200 mg via INTRAVENOUS
  Filled 2022-04-30: qty 10

## 2022-04-30 MED ORDER — SODIUM CHLORIDE 0.9 % IV SOLN: 200.0000 mg | Freq: Once | INTRAVENOUS | Status: DC

## 2022-04-30 MED ORDER — SODIUM CHLORIDE 0.9 % IV SOLN
Freq: Once | INTRAVENOUS | Status: AC
  Filled 2022-04-30: qty 250

## 2022-05-01 ENCOUNTER — Encounter: Payer: Self-pay | Admitting: Internal Medicine

## 2022-05-02 ENCOUNTER — Other Ambulatory Visit: Payer: Self-pay | Admitting: Family

## 2022-05-04 ENCOUNTER — Other Ambulatory Visit: Payer: Self-pay | Admitting: Family

## 2022-05-04 DIAGNOSIS — R110 Nausea: Secondary | ICD-10-CM

## 2022-05-08 ENCOUNTER — Ambulatory Visit (AMBULATORY_SURGERY_CENTER): Payer: PPO | Admitting: Internal Medicine

## 2022-05-08 ENCOUNTER — Encounter: Payer: Self-pay | Admitting: Internal Medicine

## 2022-05-08 VITALS — BP 136/79 | HR 68 | Temp 97.1°F | Resp 20 | Ht 61.0 in | Wt 206.0 lb

## 2022-05-08 DIAGNOSIS — K449 Diaphragmatic hernia without obstruction or gangrene: Secondary | ICD-10-CM

## 2022-05-08 DIAGNOSIS — K317 Polyp of stomach and duodenum: Secondary | ICD-10-CM

## 2022-05-08 DIAGNOSIS — K5730 Diverticulosis of large intestine without perforation or abscess without bleeding: Secondary | ICD-10-CM

## 2022-05-08 DIAGNOSIS — D509 Iron deficiency anemia, unspecified: Secondary | ICD-10-CM

## 2022-05-08 DIAGNOSIS — R197 Diarrhea, unspecified: Secondary | ICD-10-CM

## 2022-05-08 DIAGNOSIS — K31819 Angiodysplasia of stomach and duodenum without bleeding: Secondary | ICD-10-CM

## 2022-05-08 DIAGNOSIS — K5521 Angiodysplasia of colon with hemorrhage: Secondary | ICD-10-CM

## 2022-05-08 DIAGNOSIS — R1310 Dysphagia, unspecified: Secondary | ICD-10-CM | POA: Diagnosis not present

## 2022-05-08 DIAGNOSIS — K3189 Other diseases of stomach and duodenum: Secondary | ICD-10-CM

## 2022-05-08 DIAGNOSIS — K2970 Gastritis, unspecified, without bleeding: Secondary | ICD-10-CM | POA: Diagnosis not present

## 2022-05-08 DIAGNOSIS — D123 Benign neoplasm of transverse colon: Secondary | ICD-10-CM | POA: Diagnosis not present

## 2022-05-08 DIAGNOSIS — K648 Other hemorrhoids: Secondary | ICD-10-CM | POA: Diagnosis not present

## 2022-05-08 MED ORDER — SODIUM CHLORIDE 0.9 % IV SOLN: 500.0000 mL | Freq: Once | INTRAVENOUS | Status: DC

## 2022-05-08 MED ORDER — OMEPRAZOLE 40 MG PO CPDR: 40.0000 mg | DELAYED_RELEASE_CAPSULE | Freq: Two times a day (BID) | ORAL | 0 refills | Status: DC

## 2022-05-08 NOTE — Progress Notes (Signed)
GASTROENTEROLOGY PROCEDURE H&P NOTE   Primary Care Physician: [name], FNP    Reason for Procedure:   IDA, dysphagia, melena, bloody diarrhea  Plan:    EGD/colonoscopy  Patient is appropriate for endoscopic procedure(s) in the ambulatory ([facility]) setting.  The nature of the procedure, as well as the risks, benefits, and alternatives were carefully and thoroughly reviewed with the patient. Ample time for discussion and questions allowed. The patient understood, was satisfied, and agreed to proceed.     HPI: Alicia [name] is a 76 y.o. female who presents for EGD/colonoscopy for evaluation of IDA, dysphagia, melena, and bloody diarrhea.  Patient was most recently seen in the Gastroenterology Clinic on 03/18/22.  No interval change in medical history since that appointment. Please refer to that note for full details regarding GI history and clinical presentation.   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 Authorizing Provider  acetaminophen (TYLENOL) 500 MG tablet Take 1,000 mg by mouth every 6 (six) hours as needed (pain.).    [provider]  albuterol (VENTOLIN HFA) 108 (90 Base) MCG/ACT inhaler Inhale 1-2 puffs into the lungs 4 (four) times daily as needed for  shortness of breath or wheezing. 02/28/21   [provider]  aspirin EC 81 MG tablet Take 81 mg by mouth every other day. In the morning    [provider]  Budeson-Glycopyrrol-Formoterol (BREZTRI AEROSPHERE) 160-9-4.8 MCG/ACT AERO Inhale 2 puffs into the lungs 2 (two) times daily. Patient taking differently: Inhale 2 puffs into the lungs in the morning. 02/05/22   Tanda Rockers, MD  Cholecalciferol (VITAMIN D3) 125 MCG (5000 UT) CAPS Take 5,000 Units by mouth in the morning.    [provider]  [employer] TRIPLE MAGNESIUM COMPLEX PO Take 1 capsule by mouth at bedtime.    [provider]  faricimab-svoa (VABYSMO) 6 MG/0.05ML SOLN intravitreal injection 6 mg by Intravitreal route every 8 (eight) weeks.    [provider]  gabapentin (NEURONTIN) 300 MG capsule TAKE 1 CAPSULE BY MOUTH THREE TIMES A DAY 09/13/21   Magnus Sinning, MD  hydrocortisone 2.5 % cream Apply topically as directed. Apply to itchy rash on face qd up to 4 days a week prn flares Patient taking differently: Apply 1 Application topically daily as needed (rash/irritation). Apply to itchy rash on face qd up to 4 days a week prn flares 04/14/22   Brendolyn Patty, MD  Lutein 20 MG TABS Take 20 mg by mouth in the morning.    [provider]  omeprazole (PRILOSEC) 40 MG capsule Take 1 capsule (40 mg total) by mouth daily. 04/07/22   [name], FNP  pramipexole (MIRAPEX) 0.5 MG tablet Take 0.5 mg 2 (  two) times daily by mouth. 09/04/17   [provider]  venlafaxine XR (EFFEXOR-XR) 37.5 MG 24 hr capsule Take 1 capsule (37.5 mg total) by mouth at bedtime. 12/26/21 06/24/22  [name], FNP    Current Outpatient Medications  Medication Sig Dispense Refill   Budeson-Glycopyrrol-Formoterol (BREZTRI AEROSPHERE) 160-9-4.8 MCG/ACT AERO Inhale 2 puffs into the lungs 2 (two) times daily. (Patient taking differently: Inhale 2 puffs into the lungs in the morning.) 10.7 g 11   Cholecalciferol  (VITAMIN D3) 125 MCG (5000 UT) CAPS Take 5,000 Units by mouth in the morning.     CVS TRIPLE MAGNESIUM COMPLEX PO Take 1 capsule by mouth at bedtime.     gabapentin (NEURONTIN) 300 MG capsule TAKE 1 CAPSULE BY MOUTH THREE TIMES A DAY 270 capsule 3   hydrocortisone 2.5 % cream Apply topically as directed. Apply to itchy rash on face qd up to 4 days a week prn flares (Patient taking differently: Apply 1 Application topically daily as needed (rash/irritation). Apply to itchy rash on face qd up to 4 days a week prn flares) 30 g 3   omeprazole (PRILOSEC) 40 MG capsule Take 1 capsule (40 mg total) by mouth daily. 90 capsule 0   pramipexole (MIRAPEX) 0.5 MG tablet Take 0.5 mg 2 (two) times daily by mouth.  1   venlafaxine XR (EFFEXOR-XR) 37.5 MG 24 hr capsule Take 1 capsule (37.5 mg total) by mouth at bedtime. 90 capsule 1   acetaminophen (TYLENOL) 500 MG tablet Take 1,000 mg by mouth every 6 (six) hours as needed (pain.).     albuterol (VENTOLIN HFA) 108 (90 Base) MCG/ACT inhaler Inhale 1-2 puffs into the lungs 4 (four) times daily as needed for shortness of breath or wheezing.     aspirin EC 81 MG tablet Take 81 mg by mouth every other day. In the morning     faricimab-svoa (VABYSMO) 6 MG/0.05ML SOLN intravitreal injection 6 mg by Intravitreal route every 8 (eight) weeks.     Lutein 20 MG TABS Take 20 mg by mouth in the morning.     Current Facility-Administered Medications  Medication Dose Route Frequency Provider Last Rate Last Admin   0.9 %  sodium chloride infusion  500 mL Intravenous Once Sharyn Creamer, MD        Allergies as of 05/08/2022 - Review Complete 05/08/2022  Allergen Reaction Noted   Penicillins Other (See Comments) 09/30/2016    Family History  Problem Relation Age of Onset   Heart disease Mother    Cerebral aneurysm Father     Social History   Socioeconomic History   Marital status: Widowed    Spouse name: Not on file   Number of children: 1   Years of education: Not  on file   Highest education level: Not on file  Occupational History   Not on file  Tobacco Use   Smoking status: Every Day    Packs/day: 1.50    Years: 31.00    Total pack years: 46.50    Types: Cigarettes    Passive exposure: Current   Smokeless tobacco: Never   Tobacco comments:    Smokes a pack and a half a day [facility] 04/02/22  Vaping Use   Vaping Use: Never used  Substance and Sexual Activity   Alcohol use: Not Currently   Drug use: Never   Sexual activity: Not Currently  Other Topics Concern   Not on file  Social History Narrative   Not on file  Social Determinants of Health   Financial Resource Strain: Not on file  Food Insecurity: Not on file  Transportation Needs: Not on file  Physical Activity: Not on file  Stress: Not on file  Social Connections: Not on file  Intimate Partner Violence: Not on file    Physical Exam: Vital signs in last 24 hours: BP (!) 124/55   Pulse 76   Temp (!) 97.1 F (36.2 C)   Ht '5\' 1"'$  (1.549 m)   Wt 206 lb (93.4 kg)   SpO2 96%   BMI 38.92 kg/m  GEN: NAD EYE: Sclerae anicteric ENT: MMM CV: Non-tachycardic Pulm: No increased WOB GI: Soft NEURO:  Alert & Oriented   Christia Reading, MD [facility]   05/08/2022 10:58 AM

## 2022-05-08 NOTE — Progress Notes (Signed)
To pacu, VSS. Report to [email or web address] 

## 2022-05-08 NOTE — Op Note (Signed)
[facility] Patient Name: [name] Procedure Date: 05/08/2022 11:02 AM MRN: [id number] Endoscopist: Sonny Masters "Alicia [name] ,  Age: 76 Referring MD:  Date of Birth: 11/05/1946 Gender: Female Account #: [id number] Procedure:                Upper GI endoscopy Indications:              Iron deficiency anemia, Dysphagia, Melena Medicines:                Monitored Anesthesia Care Procedure:                Pre-Anesthesia Assessment:                           - Prior to the procedure, a History and Physical                            was performed, and patient medications and                            allergies were reviewed. The patient's tolerance of                            previous anesthesia was also reviewed. The risks                            and benefits of the procedure and the sedation                            options and risks were discussed with the patient.                            All questions were answered, and informed consent                            was obtained. Prior Anticoagulants: The patient has                            taken no previous anticoagulant or antiplatelet                            agents. ASA Grade Assessment: III - A patient with                            severe systemic disease. After reviewing the risks                            and benefits, the patient was deemed in                            satisfactory condition to undergo the procedure.                           After obtaining informed consent, the endoscope was  passed under direct vision. Throughout the                            procedure, the patient's blood pressure, pulse, and                            oxygen saturations were monitored continuously. The                            Endoscope was introduced through the mouth, and                            advanced to the third part of duodenum. The upper                            GI  endoscopy was accomplished without difficulty.                            The patient tolerated the procedure well. Scope In: Scope Out: Findings:                 White nummular lesions were noted in the entire                            esophagus. Biopsies were taken with a cold forceps                            for histology.                           A small hiatal hernia was present.                           Multiple sessile polyps with no bleeding and no                            stigmata of recent bleeding were found in the                            gastric body. Biopsies were taken with a cold                            forceps for histology.                           Localized inflammation characterized by congestion                            (edema) and erythema was found in the gastric                            antrum. Biopsies were taken with a cold forceps for                            histology.  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                           Biopsies were taken with a cold forceps in the                            entire duodenum for histology. Complications:            No immediate complications. Estimated Blood Loss:     Estimated blood loss was minimal. Impression:               - White nummular lesions in esophageal mucosa.                            Biopsied.                           - Small hiatal hernia.                           - Multiple gastric polyps. Biopsied.                           - Gastritis. Biopsied.                           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                           - Biopsies were taken with a cold forceps for                            histology in the entire duodenum. Recommendation:           - Use Prilosec (omeprazole) 40  mg PO BID for 8                            weeks.                           - Await pathology results.                           - Perform a colonoscopy today. [address]Alicia [name],  05/08/2022 12:11:29 PM

## 2022-05-08 NOTE — Patient Instructions (Signed)
Handout on polyps, diverticulosis given.  Use Prilosec 40 mg twice a day for 8 weeks.   YOU HAD AN ENDOSCOPIC PROCEDURE TODAY AT [facility]:   Refer to the procedure report that was given to you for any specific questions about what was found during the examination.  If the procedure report does not answer your questions, please call your gastroenterologist to clarify.  If you requested that your care partner not be given the details of your procedure findings, then the procedure report has been included in a sealed envelope for you to review at your convenience later.  YOU SHOULD EXPECT: Some feelings of bloating in the abdomen. Passage of more gas than usual.  Walking can help get rid of the air that was put into your GI tract during the procedure and reduce the bloating. If you had a lower endoscopy (such as a colonoscopy or flexible sigmoidoscopy) you may notice spotting of blood in your stool or on the toilet paper. If you underwent a bowel prep for your procedure, you may not have a normal bowel movement for a few days.  Please Note:  You might notice some irritation and congestion in your nose or some drainage.  This is from the oxygen used during your procedure.  There is no need for concern and it should clear up in a day or so.  SYMPTOMS TO REPORT IMMEDIATELY:  Following lower endoscopy (colonoscopy or flexible sigmoidoscopy):  Excessive amounts of blood in the stool  Significant tenderness or worsening of abdominal pains  Swelling of the abdomen that is new, acute  Fever of 100F or higher  Following upper endoscopy (EGD)  Vomiting of blood or coffee ground material  New chest pain or pain under the shoulder blades  Painful or persistently difficult swallowing  New shortness of breath  Fever of 100F or higher  Black, tarry-looking stools  For urgent or emergent issues, a gastroenterologist can be reached at any hour by calling [phone]. Do not use MyChart  messaging for urgent concerns.    DIET:  We do recommend a small meal at first, but then you may proceed to your regular diet.  Drink plenty of fluids but you should avoid alcoholic beverages for 24 hours.  ACTIVITY:  You should plan to take it easy for the rest of today and you should NOT DRIVE or use heavy machinery until tomorrow (because of the sedation medicines used during the test).    FOLLOW UP: Our staff will call the number listed on your records the next business day following your procedure.  We will call around 7:15- 8:00 am to check on you and address any questions or concerns that you may have regarding the information given to you following your procedure. If we do not reach you, we will leave a message.  If you develop any symptoms (ie: fever, flu-like symptoms, shortness of breath, cough etc.) before then, please call [phone].  If you test positive for Covid 19 in the 2 weeks post procedure, please call and report this information to [place].    If any biopsies were taken you will be contacted by phone or by letter within the next 1-3 weeks.  Please call us at [phone] if you have not heard about the biopsies in 3 weeks.    SIGNATURES/CONFIDENTIALITY: You and/or your care partner have signed paperwork which will be entered into your electronic medical record.  These signatures attest to the fact that that the information  above on your After Visit Summary has been reviewed and is understood.  Full responsibility of the confidentiality of this discharge information lies with you and/or your care-partner.  

## 2022-05-08 NOTE — Progress Notes (Signed)
Called to room to assist during endoscopic procedure.  Patient ID and intended procedure confirmed with present staff. Received instructions for my participation in the procedure from the performing physician.  

## 2022-05-08 NOTE — Progress Notes (Signed)
S by CW in adm   Pt's states no medical or surgical changes since previsit or office visit.

## 2022-05-08 NOTE — Op Note (Signed)
[facility] Patient Name: Eilis Chestnutt Procedure Date: 05/08/2022 11:01 AM MRN: [id number] Endoscopist: Sonny Masters "Christia Reading ,  Age: 76 Referring MD:  Date of Birth: [birth year] Gender: Female Account #: [id number] Procedure:                Colonoscopy Indications:              Diarrhea, Iron deficiency anemia Medicines:                Monitored Anesthesia Care Procedure:                Pre-Anesthesia Assessment:                           - Prior to the procedure, a History and Physical                            was performed, and patient medications and                            allergies were reviewed. The patient's tolerance of                            previous anesthesia was also reviewed. The risks                            and benefits of the procedure and the sedation                            options and risks were discussed with the patient.                            All questions were answered, and informed consent                            was obtained. Prior Anticoagulants: The patient has                            taken no previous anticoagulant or antiplatelet                            agents. ASA Grade Assessment: III - A patient with                            severe systemic disease. After reviewing the risks                            and benefits, the patient was deemed in                            satisfactory condition to undergo the procedure.                           After obtaining informed consent, the colonoscope  was passed under direct vision. Throughout the                            procedure, the patient's blood pressure, pulse, and                            oxygen saturations were monitored continuously. The                            Olympus CF-HQ190L [id number]) Colonoscope was                            introduced through the anus and advanced to the the                            cecum, identified  by appendiceal orifice and                            ileocecal valve. The patient tolerated the                            procedure well. The quality of the bowel                            preparation was adequate. The ileocecal valve,                            appendiceal orifice, and rectum were photographed.                            The colonoscopy was somewhat difficult due to a                            redundant colon. Successful completion of the                            procedure was aided by using manual pressure. Scope In: 11:30:09 AM Scope Out: 12:01:58 PM Scope Withdrawal Time: 0 hours 16 minutes 37 seconds  Total Procedure Duration: 0 hours 31 minutes 49 seconds  Findings:                 Biopsies for histology were taken with a cold                            forceps from the entire colon for evaluation of                            microscopic colitis.                           Two localized angioectasias with bleeding were                            found in the ascending colon and in the cecum.  Three sessile polyps were found in the transverse                            colon. The polyps were 3 to 4 mm in size. These                            polyps were removed with a cold snare. Resection                            and retrieval were complete.                           Diverticula were found in the sigmoid colon.                           Non-bleeding internal hemorrhoids were found during                            retroflexion. Complications:            No immediate complications. Estimated Blood Loss:     Estimated blood loss was minimal. Impression:               - Three 3 to 4 mm polyps in the transverse colon,                            removed with a cold snare. Resected and retrieved.                           - Diverticulosis in the sigmoid colon.                           - Non-bleeding internal hemorrhoids.                            - Biopsies were taken with a cold forceps from the                            entire colon for evaluation of microscopic colitis. Recommendation:           - Discharge patient to home (with escort).                           - No active bleeding was seen on today's exam. If                            patient develops recurrent drops in blood counts or                            overt GI bleeding in the future, then would                            consider bringing the patient back to get her  angioectasias treated at the hospital with APC.                           - Await pathology results.                           - The findings and recommendations were discussed                            with the patient.                           - Return to GI clinic in 4 weeks. Sonny Masters "Christia Reading,  05/08/2022 12:17:31 PM

## 2022-05-10 ENCOUNTER — Other Ambulatory Visit: Payer: Self-pay | Admitting: Family

## 2022-05-11 ENCOUNTER — Telehealth: Payer: Self-pay

## 2022-05-11 NOTE — Telephone Encounter (Signed)
  Follow up Call-     05/08/2022   10:35 AM  Call back number  Post procedure Call Back phone  # [phone]  Permission to leave phone message Yes     Patient questions:  Do you have a fever, pain , or abdominal swelling? No. Pain Score  0 *  Have you tolerated food without any problems? Yes.    Have you been able to return to your normal activities? Yes.    Do you have any questions about your discharge instructions: Diet   No. Medications  No. Follow up visit  No.  Do you have questions or concerns about your Care? No.  Actions: * If pain score is 4 or above: No action needed, pain <4.  Pt states last time had issues having a bm after the procedure, pt is taking miralax daily and 3 colace, instruct pt to continue this but make sure she is drinking 64 ounces of water daily. Pt states she has been nauseous after eating, pt states she has some suppositories at home, instruct pt to try suppository and lie on her left side for 30 mins after taking it.  Pt also to try prune juice and to call if any severe sx or my chart for non - urgent concerns.

## 2022-05-14 ENCOUNTER — Encounter: Payer: Self-pay | Admitting: Internal Medicine

## 2022-05-18 ENCOUNTER — Ambulatory Visit (HOSPITAL_COMMUNITY)
Admission: RE | Admit: 2022-05-18 | Discharge: 2022-05-18 | Disposition: A | Discharge status: Home / Self Care | Payer: PPO | Source: Ambulatory Visit | Attending: Vascular Surgery | Admitting: Vascular Surgery

## 2022-05-18 ENCOUNTER — Encounter (HOSPITAL_COMMUNITY)
Admission: RE | Disposition: A | Discharge status: Home / Self Care | Payer: Self-pay | Source: Ambulatory Visit | Attending: Vascular Surgery

## 2022-05-18 ENCOUNTER — Other Ambulatory Visit: Payer: Self-pay

## 2022-05-18 DIAGNOSIS — K551 Chronic vascular disorders of intestine: Secondary | ICD-10-CM | POA: Insufficient documentation

## 2022-05-18 HISTORY — PX: VISCERAL ANGIOGRAPHY: CATH118276

## 2022-05-18 HISTORY — PX: PERIPHERAL VASCULAR INTERVENTION: CATH118257

## 2022-05-18 LAB — POCT I-STAT, CHEM 8
BUN: 12 mg/dL (ref 8–23)
Calcium, Ion: 1.24 mmol/L (ref 1.15–1.40)
Chloride: 105 mmol/L (ref 98–111)
Creatinine, Ser: 0.6 mg/dL (ref 0.44–1.00)
Glucose, Bld: 88 mg/dL (ref 70–99)
HCT: 42 % (ref 36.0–46.0)
Hemoglobin: 14.3 g/dL (ref 12.0–15.0)
Potassium: 3.6 mmol/L (ref 3.5–5.1)
Sodium: 143 mmol/L (ref 135–145)
TCO2: 25 mmol/L (ref 22–32)

## 2022-05-18 LAB — POCT ACTIVATED CLOTTING TIME: Activated Clotting Time: 191 seconds

## 2022-05-18 SURGERY — PERIPHERAL VASCULAR INTERVENTION
Anesthesia: LOCAL

## 2022-05-18 MED ORDER — ONDANSETRON HCL 4 MG/2ML IJ SOLN
INTRAMUSCULAR | Status: DC | PRN
  Administered 2022-05-18: 4 mg via INTRAVENOUS

## 2022-05-18 MED ORDER — HEPARIN SODIUM (PORCINE) 1000 UNIT/ML IJ SOLN
INTRAMUSCULAR | Status: AC
  Filled 2022-05-18: qty 10

## 2022-05-18 MED ORDER — HEPARIN (PORCINE) IN NACL 1000-0.9 UT/500ML-% IV SOLN
INTRAVENOUS | Status: DC | PRN
  Administered 2022-05-18 (×2): 500 mL

## 2022-05-18 MED ORDER — HEPARIN SODIUM (PORCINE) 1000 UNIT/ML IJ SOLN
INTRAMUSCULAR | Status: DC | PRN
  Administered 2022-05-18: 5000 [IU] via INTRAVENOUS

## 2022-05-18 MED ORDER — HYDRALAZINE HCL 20 MG/ML IJ SOLN: 5.0000 mg | INTRAMUSCULAR | Status: DC | PRN

## 2022-05-18 MED ORDER — FENTANYL CITRATE (PF) 100 MCG/2ML IJ SOLN
INTRAMUSCULAR | Status: DC | PRN
  Administered 2022-05-18: 50 ug via INTRAVENOUS

## 2022-05-18 MED ORDER — LIDOCAINE HCL (PF) 1 % IJ SOLN
INTRAMUSCULAR | Status: DC | PRN
  Administered 2022-05-18: 12 mL

## 2022-05-18 MED ORDER — CLOPIDOGREL BISULFATE 75 MG PO TABS: 75.0000 mg | ORAL_TABLET | Freq: Every day | ORAL | 11 refills | Status: DC

## 2022-05-18 MED ORDER — HEPARIN (PORCINE) IN NACL 1000-0.9 UT/500ML-% IV SOLN
INTRAVENOUS | Status: AC
Start: 2022-05-18 — End: ?
  Filled 2022-05-18: qty 1000

## 2022-05-18 MED ORDER — ONDANSETRON HCL 4 MG/2ML IJ SOLN
4.0000 mg | Freq: Four times a day (QID) | INTRAMUSCULAR | Status: DC | PRN
  Administered 2022-05-18: 4 mg via INTRAVENOUS
  Filled 2022-05-18: qty 2

## 2022-05-18 MED ORDER — CLOPIDOGREL BISULFATE 75 MG PO TABS: 75.0000 mg | ORAL_TABLET | Freq: Every day | ORAL | Status: DC

## 2022-05-18 MED ORDER — SODIUM CHLORIDE 0.9 % WEIGHT BASED INFUSION: 1.0000 mL/kg/h | INTRAVENOUS | Status: DC

## 2022-05-18 MED ORDER — IODIXANOL 320 MG/ML IV SOLN
INTRAVENOUS | Status: DC | PRN
  Administered 2022-05-18: 90 mL

## 2022-05-18 MED ORDER — FENTANYL CITRATE (PF) 100 MCG/2ML IJ SOLN
INTRAMUSCULAR | Status: AC
  Filled 2022-05-18: qty 2

## 2022-05-18 MED ORDER — MIDAZOLAM HCL 2 MG/2ML IJ SOLN
INTRAMUSCULAR | Status: AC
  Filled 2022-05-18: qty 2

## 2022-05-18 MED ORDER — ROSUVASTATIN CALCIUM 10 MG PO TABS: 10.0000 mg | ORAL_TABLET | Freq: Every day | ORAL | Status: DC

## 2022-05-18 MED ORDER — ROSUVASTATIN CALCIUM 10 MG PO TABS: 10.0000 mg | ORAL_TABLET | Freq: Every day | ORAL | 11 refills | Status: DC

## 2022-05-18 MED ORDER — ONDANSETRON HCL 4 MG/2ML IJ SOLN
INTRAMUSCULAR | Status: AC
  Filled 2022-05-18: qty 2

## 2022-05-18 MED ORDER — LABETALOL HCL 5 MG/ML IV SOLN: 10.0000 mg | INTRAVENOUS | Status: DC | PRN

## 2022-05-18 MED ORDER — SODIUM CHLORIDE 0.9 % IV SOLN: 250.0000 mL | INTRAVENOUS | Status: DC | PRN

## 2022-05-18 MED ORDER — ACETAMINOPHEN 325 MG PO TABS
650.0000 mg | ORAL_TABLET | ORAL | Status: DC | PRN
  Administered 2022-05-18: 650 mg via ORAL
  Filled 2022-05-18: qty 2

## 2022-05-18 MED ORDER — CLOPIDOGREL BISULFATE 75 MG PO TABS
300.0000 mg | ORAL_TABLET | Freq: Once | ORAL | Status: AC
  Administered 2022-05-18: 300 mg via ORAL
  Filled 2022-05-18: qty 4

## 2022-05-18 MED ORDER — SODIUM CHLORIDE 0.9% FLUSH: 3.0000 mL | Freq: Two times a day (BID) | INTRAVENOUS | Status: DC

## 2022-05-18 MED ORDER — SODIUM CHLORIDE 0.9 % IV SOLN: INTRAVENOUS | Status: DC

## 2022-05-18 MED ORDER — SODIUM CHLORIDE 0.9% FLUSH: 3.0000 mL | INTRAVENOUS | Status: DC | PRN

## 2022-05-18 MED ORDER — LIDOCAINE HCL (PF) 1 % IJ SOLN
INTRAMUSCULAR | Status: AC
Start: 2022-05-18 — End: ?
  Filled 2022-05-18: qty 30

## 2022-05-18 MED ORDER — MIDAZOLAM HCL 2 MG/2ML IJ SOLN
INTRAMUSCULAR | Status: DC | PRN
  Administered 2022-05-18: 1 mg via INTRAVENOUS

## 2022-05-18 SURGICAL SUPPLY — 12 items
CATH OMNI FLUSH 5F 65CM (CATHETERS) ×1 IMPLANT
KIT ENCORE 26 ADVANTAGE (KITS) ×1 IMPLANT
KIT MICROPUNCTURE NIT STIFF (SHEATH) ×1 IMPLANT
KIT PV (KITS) ×3 IMPLANT
SHEATH PINNACLE 5F 10CM (SHEATH) ×1 IMPLANT
SHEATH PINNACLE 7F 10CM (SHEATH) ×1 IMPLANT
STENT VIABAHN VBX 7X19X80 (Permanent Stent) ×1 IMPLANT
SYR MEDRAD MARK V 150ML (SYRINGE) ×1 IMPLANT
TRANSDUCER W/STOPCOCK (MISCELLANEOUS) ×3 IMPLANT
TRAY PV CATH (CUSTOM PROCEDURE TRAY) ×3 IMPLANT
WIRE BENTSON .035X145CM (WIRE) ×1 IMPLANT
WIRE ROSEN-J .035X260CM (WIRE) ×1 IMPLANT

## 2022-05-18 NOTE — H&P (Signed)
HPI:   Alicia [name] is a 76 y.o. female whom I have seen for carotid disease and leg pain in the past with reflux studies.  She now presents for abdominal pain.  She states that for the past year or 2 she has lost about 35 pounds.  She has pain after eating.  She does not have any food fear.  She states that since a colonoscopy about a year ago she has had intermittent diarrhea and severe gaseous distention.  She has not had any vomiting.  She is planned for colonoscopy and upper endoscopy on June 30 of this year.  She continues on aspirin every other day does have easy bruising of her bilateral upper extremities.       Past Medical History:  Diagnosis Date   Carotid artery occlusion     COPD with acute exacerbation ([place]) 12/26/2021   History of hiatal hernia     Macular degeneration     Sleep apnea           Family History  Problem Relation Age of Onset   Heart disease Mother     Cerebral aneurysm Father           Past Surgical History:  Procedure Laterality Date   APPENDECTOMY       CHOLECYSTECTOMY   10/2016   EYE SURGERY Bilateral 2017    cataracts removed   LUMBAR LAMINECTOMY/DECOMPRESSION MICRODISCECTOMY Left 07/21/2021    Procedure: Laminectomy and Foraminotomy - L4-L5 - left;  Surgeon: Kary Kos, MD;  Location: [facility];  Service: Neurosurgery;  Laterality: Left;  3C   PAROTID GLAND TUMOR EXCISION Left 2003      Short Social History:  Social History         Tobacco Use   Smoking status: Every Day      Packs/day: 1.50      Years: 31.00      Pack years: 46.50      Types: Cigarettes      Passive exposure: Current   Smokeless tobacco: Never  Substance Use Topics   Alcohol use: Not Currently           Allergies  Allergen Reactions   Penicillins Other (See Comments)      Yeast infection              Current Outpatient Medications  Medication Sig Dispense Refill   acetaminophen (TYLENOL) 500 MG tablet Take 1,500 mg by mouth every 6 (six) hours as  needed (pain.).       Aflibercept (EYLEA IO) Inject 1 Dose into the eye as directed. Every 6 weeks       albuterol (VENTOLIN HFA) 108 (90 Base) MCG/ACT inhaler Inhale 1-2 puffs into the lungs 4 (four) times daily as needed for shortness of breath or wheezing.       aspirin EC 81 MG tablet Take 81 mg by mouth every other day. In the morning       Budeson-Glycopyrrol-Formoterol (BREZTRI AEROSPHERE) 160-9-4.8 MCG/ACT AERO Inhale 2 puffs into the lungs 2 (two) times daily. 10.7 g 11   [employer] TRIPLE MAGNESIUM COMPLEX PO Take by mouth.       gabapentin (NEURONTIN) 300 MG capsule TAKE 1 CAPSULE BY MOUTH THREE TIMES A DAY 270 capsule 3   Lutein 20 MG TABS Take 20 mg by mouth in the morning.       omeprazole (PRILOSEC) 40 MG capsule Take 1 capsule (40 mg total) by mouth daily. (Patient taking differently:  Take 40 mg by mouth 2 (two) times daily.) 90 capsule 0   pramipexole (MIRAPEX) 0.5 MG tablet Take 0.5 mg 2 (two) times daily by mouth.   1   venlafaxine XR (EFFEXOR-XR) 37.5 MG 24 hr capsule Take 1 capsule (37.5 mg total) by mouth at bedtime. 90 capsule 1    No current facility-administered medications for this visit.      Review of Systems  Constitutional:  Constitutional negative. HENT: HENT negative.  Eyes: Eyes negative.  Respiratory: Respiratory negative.  GI: Positive for abdominal pain and diarrhea.  Musculoskeletal: Positive for leg pain.  Skin: Skin negative.  Neurological: Neurological negative. Hematologic: Positive for bruises/bleeds easily.         Objective:    Vitals:   05/18/22 0610  BP: 121/65  Pulse: 74  Resp: 20  Temp: 97.8 F (36.6 C)  SpO2: 92%      Physical Exam HENT:     Head: Normocephalic.     Nose: Nose normal.  Eyes:     Pupils: Pupils are equal, round, and reactive to light.  Neck:     Vascular: No carotid bruit.  Cardiovascular:     Rate and Rhythm: Normal rate.     Pulses: Normal pulses.     Heart sounds: Normal heart sounds.  Abdominal:      General: Abdomen is flat. Bowel sounds are normal.     Palpations: Abdomen is soft. There is no mass.     Comments: No bruits  Musculoskeletal:        General: Normal range of motion.     Cervical back: Normal range of motion and neck supple.     Right lower leg: No edema.     Left lower leg: No edema.  Skin:    General: Skin is warm and dry.     Capillary Refill: Capillary refill takes less than 2 seconds.  Neurological:     General: No focal deficit present.     Mental Status: She is alert.  Psychiatric:        Mood and Affect: Mood normal.        Thought Content: Thought content normal.        Judgment: Judgment normal.      Data: Duplex Findings:  +----------------------+--------+--------+------+------------------------+  Mesenteric            PSV cm/sEDV cm/sPlaque        Comments          +----------------------+--------+--------+------+------------------------+  Aorta at Celiac          72                                           +----------------------+--------+--------+------+------------------------+  Celiac Artery Origin    276                                           +----------------------+--------+--------+------+------------------------+  Celiac Artery Proximal  143                 Post stenotic turbulence  +----------------------+--------+--------+------+------------------------+  SMA Proximal            207                 Post stenotic turbulence  +----------------------+--------+--------+------+------------------------+  SMA Mid  70                                           +----------------------+--------+--------+------+------------------------+  CHA                     202                 Post stenotic turbulence  +----------------------+--------+--------+------+------------------------+  Splenic                 156                 Post stenotic turbulence   +----------------------+--------+--------+------+------------------------+  IMA                                            Could not identify     +----------------------+--------+--------+------+------------------------+                 Summary:  Mesenteric:  70 to 99% stenosis in the celiac artery and superior mesenteric artery.  Limited study secondary to body habitus and bowel gas.       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  We reviewed the CT together.  She will continue aspirin we will plan for aortogram from the right common femoral approach.  possibly will add plavix if intervention required today.       Waynetta Sandy MD [facility]

## 2022-05-18 NOTE — Progress Notes (Signed)
Patient called out stating she needed to pass gas, wants to ambulate, she's still on bedrest. Also stated she felt nauseous, 4 mg of zofran given, emisis x1, BP dropped to 90's/40's, pt flat, O2 placed at 2L, bolus order given for 250. Pt states she feels better, just tired. Donzetta Matters updated and will come by later to bedside.

## 2022-05-18 NOTE — Progress Notes (Signed)
Per Dr Donzetta Matters, okay to give loading dose of plavix now. Will inform RN.

## 2022-05-18 NOTE — Op Note (Signed)
    Patient name: Alicia [name] MRN: [id number] DOB: 02/23/1946 Sex: female  05/18/2022 Pre-operative Diagnosis: chronic mesenteric ischemia Post-operative diagnosis:  Same Surgeon:  Erlene Quan C. Donzetta Matters, MD Procedure Performed: 1.  Ultrasound-guided cannulation right common femoral artery 2.  Aortogram 3.  Celiac artery stenting with 7 x 29 mm VBX 4.  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  She is medicated for aortogram with possible intervention.  Findings: Celiac artery had a very tight 90% stenosis at the takeoff there was a very large GDA that seem to fill the SMA very briskly distally.  I elected to stent the celiac as the SMA is known to have long segment occlusion/stenosis.  At completion there was 0% residual stenosis.   Procedure:  The patient was identified in the holding area and taken to room 8.  The patient was then placed supine on the table and prepped and draped in the usual sterile fashion.  A time out was called.  Ultrasound was used to evaluate the right common femoral artery which was noted be patent and compressible.  The artery was then cannulated with direct ultrasound visualization with micropuncture needle followed by wire sheath.  Bentson wires placed followed by 5 [place] sheath.  Omni catheter was placed to the level of T12 and aortogram was performed in the lateral position.  With the above findings we placed a Rosen wire followed by a 7 [place] Oscor sheath.  Patient was given 5000's of heparin.  Celiac artery was cannulated with the Oscor sheath and the Rosen wire was passed distally and we confirmed intraluminal access.  We then primarily stented with a 7 x 29 mm VBX.  Completion demonstrated 0% residual stenosis.  I exchanged for a short 7 [place] sheath with plans for retrograde imaging but unfortunately the sheath was kinked I  ultimately remove the sheath and held pressure for 20 minutes until hemostasis was obtained.  She tolerated the procedure well without any complication.   Contrast: 90cc  Janaki Exley C. Donzetta Matters, MD [facility] Office: [phone] Pager: [phone]

## 2022-05-18 NOTE — Progress Notes (Signed)
Dr Donzetta Matters to bedside to assess pt prior to D/C after ambulation. Approved D/C. Pt states she feels great with no pain.

## 2022-05-19 ENCOUNTER — Encounter (HOSPITAL_COMMUNITY): Payer: Self-pay | Admitting: Vascular Surgery

## 2022-05-20 ENCOUNTER — Other Ambulatory Visit: Payer: PPO

## 2022-05-21 ENCOUNTER — Telehealth: Payer: Self-pay | Admitting: Internal Medicine

## 2022-05-21 NOTE — Telephone Encounter (Signed)
Spoke with the patient. She had her colonoscopy on 05/08/22. She states she is having a return of her bowel issues of small frequent soft stools. She is concerned she is impacting. Her abdomen feels distended. Stent placement on Monday 05/18/22. Bruised but recovering. She wants further information on the damage to her gastric lining. And recommendations for her bowel movement issues. She is scheduled for follow up 07/02/22.

## 2022-05-22 NOTE — Telephone Encounter (Signed)
Attempted to the call the patient with no response. Patient should be on omeprazole 40 mg BID that should help with the healing of the gastritis that was seen on her last EGD. Please offer her an earlier appt with one of our APPs. I would also recommend that she reach out to her vascular surgeons to let them know about her symptoms. With regard to her frequent bowel movements, I would recommend trying to follow a bland diet for now. If she has additional questions, please feel free to let me know.

## 2022-05-22 NOTE — Telephone Encounter (Signed)
Spoke with the patient and shared the recommendations. No further questions at this time.
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
## 2022-05-27 ENCOUNTER — Ambulatory Visit: Payer: PPO | Admitting: Orthopedic Surgery

## 2022-06-02 ENCOUNTER — Ambulatory Visit
Admission: RE | Admit: 2022-06-02 | Discharge: 2022-06-02 | Disposition: A | Discharge status: Home / Self Care | Payer: PPO | Source: Ambulatory Visit | Attending: Surgical | Admitting: Surgical

## 2022-06-02 DIAGNOSIS — M25511 Pain in right shoulder: Secondary | ICD-10-CM

## 2022-06-02 MED ORDER — IOPAMIDOL (ISOVUE-M 200) INJECTION 41%
13.0000 mL | Freq: Once | INTRAMUSCULAR | Status: AC
  Administered 2022-06-02: 13 mL via INTRA_ARTICULAR

## 2022-06-03 ENCOUNTER — Other Ambulatory Visit: Payer: Self-pay | Admitting: *Deleted

## 2022-06-03 DIAGNOSIS — K551 Chronic vascular disorders of intestine: Secondary | ICD-10-CM

## 2022-06-05 ENCOUNTER — Ambulatory Visit (INDEPENDENT_AMBULATORY_CARE_PROVIDER_SITE_OTHER): Payer: PPO | Admitting: Orthopedic Surgery

## 2022-06-05 ENCOUNTER — Encounter: Payer: Self-pay | Admitting: Orthopedic Surgery

## 2022-06-05 ENCOUNTER — Other Ambulatory Visit: Payer: Self-pay

## 2022-06-05 DIAGNOSIS — M25511 Pain in right shoulder: Secondary | ICD-10-CM

## 2022-06-05 DIAGNOSIS — M12811 Other specific arthropathies, not elsewhere classified, right shoulder: Secondary | ICD-10-CM | POA: Diagnosis not present

## 2022-06-05 DIAGNOSIS — M19012 Primary osteoarthritis, left shoulder: Secondary | ICD-10-CM | POA: Diagnosis not present

## 2022-06-05 NOTE — Progress Notes (Signed)
ct 
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
## 2022-06-15 ENCOUNTER — Ambulatory Visit
Admission: RE | Admit: 2022-06-15 | Discharge: 2022-06-15 | Disposition: A | Discharge status: Home / Self Care | Payer: PPO | Source: Ambulatory Visit | Attending: Orthopedic Surgery | Admitting: Orthopedic Surgery

## 2022-06-15 DIAGNOSIS — M25511 Pain in right shoulder: Secondary | ICD-10-CM

## 2022-06-16 ENCOUNTER — Encounter: Payer: Self-pay | Admitting: Family

## 2022-06-16 ENCOUNTER — Ambulatory Visit (INDEPENDENT_AMBULATORY_CARE_PROVIDER_SITE_OTHER): Payer: PPO | Admitting: Family

## 2022-06-16 VITALS — BP 110/62 | HR 78 | Temp 98.6°F | Resp 16 | Ht 60.0 in | Wt 200.5 lb

## 2022-06-16 DIAGNOSIS — L219 Seborrheic dermatitis, unspecified: Secondary | ICD-10-CM

## 2022-06-16 DIAGNOSIS — I774 Celiac artery compression syndrome: Secondary | ICD-10-CM | POA: Diagnosis not present

## 2022-06-16 DIAGNOSIS — K219 Gastro-esophageal reflux disease without esophagitis: Secondary | ICD-10-CM

## 2022-06-16 DIAGNOSIS — J449 Chronic obstructive pulmonary disease, unspecified: Secondary | ICD-10-CM

## 2022-06-16 DIAGNOSIS — M75101 Unspecified rotator cuff tear or rupture of right shoulder, not specified as traumatic: Secondary | ICD-10-CM

## 2022-06-16 DIAGNOSIS — E782 Mixed hyperlipidemia: Secondary | ICD-10-CM

## 2022-06-16 DIAGNOSIS — M19211 Secondary osteoarthritis, right shoulder: Secondary | ICD-10-CM | POA: Insufficient documentation

## 2022-06-16 DIAGNOSIS — D509 Iron deficiency anemia, unspecified: Secondary | ICD-10-CM

## 2022-06-16 DIAGNOSIS — F4323 Adjustment disorder with mixed anxiety and depressed mood: Secondary | ICD-10-CM

## 2022-06-16 MED ORDER — HYDROCORTISONE 2.5 % EX CREA: 1.0000 | TOPICAL_CREAM | Freq: Every day | CUTANEOUS | Status: DC | PRN

## 2022-06-16 NOTE — Assessment & Plan Note (Signed)
continue effexor 37.5 mg once daily.

## 2022-06-16 NOTE — Assessment & Plan Note (Signed)
Continue f/u with orthopedist as scheduled

## 2022-06-16 NOTE — Assessment & Plan Note (Signed)
Stable Continue ongoing f/u with hematology and ferritin infusions as scheduled. Cbc reviewed

## 2022-06-16 NOTE — Progress Notes (Signed)
Established Patient Office Visit  Subjective:  Patient ID: Alicia [name], female    DOB: 28-Sep-1946  Age: 76 y.o. MRN: [id number]  CC:  Chief Complaint  Patient presents with   Constipation    Having trouble going    HPI Alicia [name] is here today for follow up.   CT lung: centrilobular emphysema, last 03/02/22: told to continue annual screening. Bening appearance of nodule. Left lower lobe nodule 9.2 mm not significantly changed from prior. Multiple small lung nodules.   Gastritis: GI wants her on omeprazole 40 mg twice daily. Gastritis on last EGD. Still often with gas. When she does have a bowel movement, very small movement, not complete release. She does take a laxative to keep her going and regular. Has f/u with GI august 24th.   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 Seen by vascular surgeon Dr. Donzetta Matters. Aortogram with right common femoral approach. Stent was placed in the celiac area, u/s scheduled and f/u with Dr. Donzetta Matters in one week. After stent was placed was started on plavix 75 mg as well as asa 81 mg. May d/c after visit with him for f/u per pt.   Primary OA left shoulder: seeing orthopedist Dr. Marlou Sa who is planning on shoulder replacement. This is scheduled for September 28th. CT shoulder right, with complete tear of supraspinatus and infraspinatus tendons. Moderate OA of glenohumeral joint.   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 Scheduled for infusion as well. . Lab Results  Component Value Date   WBC 7.9 04/20/2022   HGB 14.3 05/18/2022   HCT 42.0 05/18/2022   MCV 82.6 04/20/2022   PLT 226 04/20/2022   Lab Results  Component Value Date   FERRITIN 5 (L) 04/20/2022   Lab Results  Component Value Date   CHOL 168 01/13/2022   HDL 59.20 01/13/2022   LDLCALC 97 01/13/2022   TRIG 59.0 01/13/2022   CHOLHDL 3 01/13/2022       Past Medical History:  Diagnosis Date    Asthma    Carotid artery occlusion    COPD with acute exacerbation ([place]) 12/26/2021   History of hiatal hernia    Macular degeneration    Positive fecal occult blood test 01/19/2022   Sleep apnea    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   R mid forearm, EDC    Past Surgical History:  Procedure Laterality Date   APPENDECTOMY     CHOLECYSTECTOMY  10/2016   EYE SURGERY Bilateral 2017   cataracts removed   LUMBAR LAMINECTOMY/DECOMPRESSION MICRODISCECTOMY Left 07/21/2021   Procedure: Laminectomy and Foraminotomy - L4-L5 - left;  Surgeon: Kary Kos, MD;  Location: [facility];  Service: Neurosurgery;  Laterality: Left;  3C   PAROTID GLAND TUMOR EXCISION Left 2003   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   Procedure: PERIPHERAL VASCULAR INTERVENTION;  Surgeon: Waynetta Sandy, MD;  Location: [facility];  Service: Cardiovascular;;  celiac   VISCERAL ANGIOGRAPHY N/A 05/18/2022   Procedure: VISCERAL ANGIOGRAPHY;  Surgeon: Waynetta Sandy, MD;  Location: [facility];  Service: Cardiovascular;  Laterality: N/A;    Family History  Problem Relation Age of Onset   Heart disease Mother    Cerebral aneurysm Father     Social History   Socioeconomic History   Marital status: Widowed    Spouse name: Not on file   Number of children: 1  Years of education: Not on file   Highest education level: Not on file  Occupational History   Not on file  Tobacco Use   Smoking status: Every Day    Packs/day: 1.50    Years: 31.00    Total pack years: 46.50    Types: Cigarettes    Passive exposure: Current   Smokeless tobacco: Never   Tobacco comments:    Smokes a pack and a half a day [facility] 04/02/22  Vaping Use   Vaping Use: Never used  Substance and Sexual Activity   Alcohol use: Not Currently   Drug use: Never   Sexual activity: Not Currently  Other Topics Concern   Not on file  Social History Narrative   Not on file   Social Determinants of  Health   Financial Resource Strain: Not on file  Food Insecurity: Not on file  Transportation Needs: Not on file  Physical Activity: Not on file  Stress: Not on file  Social Connections: Not on file  Intimate Partner Violence: Not on file    Outpatient Medications Prior to Visit  Medication Sig Dispense Refill   acetaminophen (TYLENOL) 500 MG tablet Take 1,000 mg by mouth every 6 (six) hours as needed (pain.).     albuterol (VENTOLIN HFA) 108 (90 Base) MCG/ACT inhaler Inhale 1-2 puffs into the lungs 4 (four) times daily as needed for shortness of breath or wheezing.     aspirin EC 81 MG tablet Take 81 mg by mouth every other day. In the morning     Budeson-Glycopyrrol-Formoterol (BREZTRI AEROSPHERE) 160-9-4.8 MCG/ACT AERO Inhale 2 puffs into the lungs 2 (two) times daily. (Patient taking differently: Inhale 2 puffs into the lungs in the morning.) 10.7 g 11   Cholecalciferol (VITAMIN D3) 125 MCG (5000 UT) CAPS Take 5,000 Units by mouth in the morning.     clopidogrel (PLAVIX) 75 MG tablet Take 1 tablet (75 mg total) by mouth daily. 30 tablet 11   [employer] TRIPLE MAGNESIUM COMPLEX PO Take 1 capsule by mouth at bedtime.     faricimab-svoa (VABYSMO) 6 MG/0.05ML SOLN intravitreal injection 6 mg by Intravitreal route every 8 (eight) weeks.     gabapentin (NEURONTIN) 300 MG capsule TAKE 1 CAPSULE BY MOUTH THREE TIMES A DAY 270 capsule 3   Lutein 20 MG TABS Take 20 mg by mouth in the morning.     omeprazole (PRILOSEC) 40 MG capsule Take 1 capsule (40 mg total) by mouth 2 (two) times daily. For 8 weeks. 180 capsule 0   pramipexole (MIRAPEX) 0.5 MG tablet TAKE 1 TABLET BY MOUTH TWICE A DAY 180 tablet 1   rosuvastatin (CRESTOR) 10 MG tablet Take 1 tablet (10 mg total) by mouth daily. 30 tablet 11   venlafaxine XR (EFFEXOR-XR) 37.5 MG 24 hr capsule Take 1 capsule (37.5 mg total) by mouth at bedtime. 90 capsule 1   hydrocortisone 2.5 % cream Apply topically as directed. Apply to itchy rash on face qd up to  4 days a week prn flares (Patient taking differently: Apply 1 Application topically daily as needed (rash/irritation). Apply to itchy rash on face qd up to 4 days a week prn flares) 30 g 3   omeprazole (PRILOSEC) 40 MG capsule Take 1 capsule (40 mg total) by mouth daily. 90 capsule 0   No facility-administered medications prior to visit.    Allergies  Allergen Reactions   Penicillins Other (See Comments)    Yeast infection  Objective:    Physical Exam Constitutional:      General: She is not in acute distress.    Appearance: Normal appearance. She is obese. She is not ill-appearing, toxic-appearing or diaphoretic.  Cardiovascular:     Rate and Rhythm: Normal rate and regular rhythm.  Pulmonary:     Effort: Pulmonary effort is normal.     Breath sounds: Normal breath sounds.  Musculoskeletal:     Right lower leg: No edema.     Left lower leg: No edema.  Neurological:     General: No focal deficit present.     Mental Status: She is alert and oriented to person, place, and time. Mental status is at baseline.  Psychiatric:        Mood and Affect: Mood normal.        Behavior: Behavior normal.        Thought Content: Thought content normal.        Judgment: Judgment normal.       BP 110/62   Pulse 78   Temp 98.6 F (37 C)   Resp 16   Ht 5' (1.524 m)   Wt 200 lb 8 oz (90.9 kg)   SpO2 94% Comment: hold hand  BMI 39.16 kg/m  Wt Readings from Last 3 Encounters:  06/16/22 200 lb 8 oz (90.9 kg)  05/18/22 196 lb (88.9 kg)  05/08/22 206 lb (93.4 kg)     Health Maintenance Due  Topic Date Due   Hepatitis C Screening  Never done   TETANUS/TDAP  Never done   Zoster Vaccines- Shingrix (1 of 2) Never done   COVID-19 Vaccine (3 - Pfizer risk series) 03/11/2020   INFLUENZA VACCINE  06/09/2022    There are no preventive care reminders to display for this patient.  Lab Results  Component Value Date   TSH 2.67 02/12/2022   Lab Results  Component Value Date    WBC 7.9 04/20/2022   HGB 14.3 05/18/2022   HCT 42.0 05/18/2022   MCV 82.6 04/20/2022   PLT 226 04/20/2022   Lab Results  Component Value Date   NA 143 05/18/2022   K 3.6 05/18/2022   CO2 25 01/13/2022   GLUCOSE 88 05/18/2022   BUN 12 05/18/2022   CREATININE 0.60 05/18/2022   BILITOT 0.4 01/13/2022   ALKPHOS 112 01/13/2022   AST 17 01/13/2022   ALT 10 01/13/2022   PROT 8.2 01/13/2022   ALBUMIN 4.4 01/13/2022   CALCIUM 9.9 01/13/2022   ANIONGAP 8 07/10/2021   GFR 79.97 01/13/2022   Lab Results  Component Value Date   CHOL 168 01/13/2022   Lab Results  Component Value Date   HDL 59.20 01/13/2022   Lab Results  Component Value Date   LDLCALC 97 01/13/2022   Lab Results  Component Value Date   TRIG 59.0 01/13/2022   Lab Results  Component Value Date   CHOLHDL 3 01/13/2022   No results found for: "HGBA1C"    Assessment & Plan:   Problem List Items Addressed This Visit       Cardiovascular and Mediastinum   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  Continue plavix 75 mg and asa 81 mg once daily.        Respiratory   Asthmatic bronchitis , chronic (HCC)    Continue inhaler Continue f/u with pulmonary  Continue CT annual screening of chest as scheduled  Digestive   GERD (gastroesophageal reflux disease)    Continue pantoprazole 40 mg twice daily. Try to decrease and or avoid spicy foods, fried fatty foods, and also caffeine and chocolate as these can increase heartburn symptoms.          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 Cbc reviewed       Mixed hyperlipidemia    reviewed last lipid panel, doing well.  continue rosuvastatin 5 mg once daily.       Adjustment reaction with anxiety and depression    continue effexor 37.5 mg once daily.        Meds ordered this encounter  Medications   hydrocortisone 2.5 % cream    Sig: Apply 1 Application topically daily as needed (rash/irritation). Apply to itchy rash on face qd up to 4 days a week prn flares    Order Specific Question:   Supervising Provider    Answer:   Diona Browner, AMY E [[id number]]    Follow-up: Return in about 6 months (around 12/17/2022) for regular follow up .    [name], FNP

## 2022-06-16 NOTE — Assessment & Plan Note (Signed)
reviewed last lipid panel, doing well.  continue rosuvastatin 5 mg once daily.

## 2022-06-16 NOTE — Assessment & Plan Note (Signed)
Continue inhaler Continue f/u with pulmonary  Continue CT annual screening of chest as scheduled

## 2022-06-16 NOTE — Assessment & Plan Note (Signed)
Continue pantoprazole 40 mg twice daily. Try to decrease and or avoid spicy foods, fried fatty foods, and also caffeine and chocolate as these can increase heartburn symptoms.

## 2022-06-16 NOTE — Assessment & Plan Note (Addendum)
Stent placed Continue ongoing f/u with vascular surgeon as scheduled.  Continue plavix 75 mg and asa 81 mg once daily.

## 2022-06-16 NOTE — Patient Instructions (Addendum)
Come fasting to six month follow up apt.   Due to recent changes in healthcare laws, you may see results of your imaging and/or laboratory studies on MyChart before I have had a chance to review them.  I understand that in some cases there may be results that are confusing or concerning to you. Please understand that not all results are received at the same time and often I may need to interpret multiple results in order to provide you with the best plan of care or course of treatment. Therefore, I ask that you please give me 2 business days to thoroughly review all your results before contacting my office for clarification. Should we see a critical lab result, you will be contacted sooner.   It was a pleasure seeing you today! Please do not hesitate to reach out with any questions and or concerns.  Regards,   [name] FNP-C

## 2022-06-17 NOTE — Progress Notes (Signed)
Blue sheet done.  I think she has to have some vascular stuff done first noted.  I left a note for Debbie on which she

## 2022-06-22 ENCOUNTER — Ambulatory Visit: Payer: PPO | Admitting: Dermatology

## 2022-06-22 DIAGNOSIS — L918 Other hypertrophic disorders of the skin: Secondary | ICD-10-CM

## 2022-06-22 DIAGNOSIS — D1800 Hemangioma unspecified site: Secondary | ICD-10-CM | POA: Diagnosis not present

## 2022-06-22 DIAGNOSIS — L821 Other seborrheic keratosis: Secondary | ICD-10-CM

## 2022-06-22 DIAGNOSIS — L578 Other skin changes due to chronic exposure to nonionizing radiation: Secondary | ICD-10-CM

## 2022-06-22 DIAGNOSIS — D225 Melanocytic nevi of trunk: Secondary | ICD-10-CM | POA: Diagnosis not present

## 2022-06-22 DIAGNOSIS — L814 Other melanin hyperpigmentation: Secondary | ICD-10-CM

## 2022-06-22 DIAGNOSIS — L820 Inflamed seborrheic keratosis: Secondary | ICD-10-CM

## 2022-06-22 DIAGNOSIS — D229 Melanocytic nevi, unspecified: Secondary | ICD-10-CM

## 2022-06-22 DIAGNOSIS — Z85828 Personal history of other malignant neoplasm of skin: Secondary | ICD-10-CM

## 2022-06-22 NOTE — Progress Notes (Signed)
Follow-Up Visit   Subjective  Alicia [name] is a 76 y.o. female who presents for the following: Follow-up.  Patient here for 2 month follow-up ISKs. Improved, but still a small residual of the right axilla. She also has symptomatic skin tags of the left inner thigh and right inframammary to remove today. Patient would also like her back checked today, itchy spot bothersome. History of SCC of the right mid forearm, biopsy and EDC 04/14/2022.   The following portions of the chart were reviewed this encounter and updated as appropriate:       Review of Systems:  No other skin or systemic complaints except as noted in HPI or Assessment and Plan.  Objective  Well appearing patient in no apparent distress; mood and affect are within normal limits.  A focused examination was performed including trunk, extremities. Relevant physical exam findings are noted in the Assessment and Plan.  spinal lower back Erythematous stuck-on, waxy papule or plaque,  light tan macules x 2 of the right axilla c/w previously treated ISKs.   L lat mid back 2.5 mm med dark brown macule  R mid forearm Well healed scar with no evidence of recurrence.     Assessment & Plan  Actinic Damage - chronic, secondary to cumulative UV radiation exposure/sun exposure over time - diffuse scaly erythematous macules with underlying dyspigmentation - Recommend daily broad spectrum sunscreen SPF 30+ to sun-exposed areas, reapply every 2 hours as needed.  - Recommend staying in the shade or wearing long sleeves, sun glasses (UVA+UVB protection) and wide brim hats (4-inch brim around the entire circumference of the hat). - Call for new or changing lesions.  Hemangiomas - Red papules - Discussed benign nature - Observe - Call for any changes  Seborrheic Keratoses - Stuck-on, waxy, tan-brown papules and/or plaques - Benign-appearing - Discussed benign etiology and prognosis. - Observe - Call for any  changes  Lentigines - Scattered tan macules - Due to sun exposure - Benign-appering, observe - Recommend daily broad spectrum sunscreen SPF 30+ to sun-exposed areas, reapply every 2 hours as needed. - Call for any changes  Acrochordons (Skin Tags) - Removal desired by patient, Symptomatic - Fleshy, skin-colored pedunculated papules - Benign appearing.  - Patient desires removal due to symptoms - Prior to the procedure, reviewed the expected small wound. Also reviewed the risk of leaving a small scar and the small risk of infection.  PROCEDURE - The areas were prepped with isopropyl alcohol. A small amount of lidocaine 1% with epinephrine was injected at the base of each lesion to achieve good local anesthesia. The skin tags were removed using a snip technique. Aluminum chloride was used for hemostasis. Petrolatum and a bandage were applied. The procedure was tolerated well. - Wound care was reviewed with the patient. They were advised to call with any concerns. Total number of treated acrochordons 5 (right inframammary x 4, left upper inner thigh x 1)  Inflamed seborrheic keratosis spinal lower back  Symptomatic, irritating, patient would like treated.  Destruction of lesion - spinal lower back  Destruction method: cryotherapy   Informed consent: discussed and consent obtained   Lesion destroyed using liquid nitrogen: Yes   Region frozen until ice ball extended beyond lesion: Yes   Outcome: patient tolerated procedure well with no complications   Post-procedure details: wound care instructions given   Additional details:  Prior to procedure, discussed risks of blister formation, small wound, skin dyspigmentation, or rare scar following cryotherapy. Recommend Vaseline ointment  to treated areas while healing.   Nevus L lat mid back  Benign-appearing.  Observation.  Call clinic for new or changing moles.  Recommend daily use of broad spectrum spf 30+ sunscreen to sun-exposed areas.    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 Observe for recurrence. Call clinic for new or changing lesions.  Recommend regular skin exams, daily broad-spectrum spf 30+ sunscreen use, and photoprotection.     Return in about 6 months (around 12/23/2022) for TBSE, Hx SCC.  IJamesetta Orleans, CMA, am acting as scribe for Brendolyn Patty, MD .  Documentation: I have reviewed the above documentation for accuracy and completeness, and I agree with the above.  Brendolyn Patty MD

## 2022-06-22 NOTE — Patient Instructions (Signed)
Due to recent changes in healthcare laws, you may see results of your pathology and/or laboratory studies on MyChart before the doctors have had a chance to review them. We understand that in some cases there may be results that are confusing or concerning to you. Please understand that not all results are received at the same time and often the doctors may need to interpret multiple results in order to provide you with the best plan of care or course of treatment. Therefore, we ask that you please give us 2 business days to thoroughly review all your results before contacting the office for clarification. Should we see a critical lab result, you will be contacted sooner.   If You Need Anything After Your Visit  If you have any questions or concerns for your doctor, please call our main line at [phone] and press option 4 to reach your doctor's medical assistant. If no one answers, please leave a voicemail as directed and we will return your call as soon as possible. Messages left after 4 pm will be answered the following business day.   You may also send us a message via MyChart. We typically respond to MyChart messages within 1-2 business days.  For prescription refills, please ask your pharmacy to contact our office. Our fax number is [phone].  If you have an urgent issue when the clinic is closed that cannot wait until the next business day, you can page your doctor at the number below.    Please note that while we do our best to be available for urgent issues outside of office hours, we are not available 24/7.   If you have an urgent issue and are unable to reach us, you may choose to seek medical care at your doctor's office, retail clinic, urgent care center, or emergency room.  If you have a medical emergency, please immediately call 911 or go to the emergency department.  Pager Numbers  - Dr. Kowalski: [phone]  - Dr. Moye: [phone]  - Dr. Stewart:  [phone]  In the event of inclement weather, please call our main line at [phone] for an update on the status of any delays or closures.  Dermatology Medication Tips: Please keep the boxes that topical medications come in in order to help keep track of the instructions about where and how to use these. Pharmacies typically print the medication instructions only on the boxes and not directly on the medication tubes.   If your medication is too expensive, please contact our office at [phone] option 4 or send us a message through MyChart.   We are unable to tell what your co-pay for medications will be in advance as this is different depending on your insurance coverage. However, we may be able to find a substitute medication at lower cost or fill out paperwork to get insurance to cover a needed medication.   If a prior authorization is required to get your medication covered by your insurance company, please allow us 1-2 business days to complete this process.  Drug prices often vary depending on where the prescription is filled and some pharmacies may offer cheaper prices.  The website www.goodrx.com contains coupons for medications through different pharmacies. The prices here do not account for what the cost may be with help from insurance (it may be cheaper with your insurance), but the website can give you the price if you did not use any insurance.  - You can print the associated coupon and take it with   your prescription to the pharmacy.  - You may also stop by our office during regular business hours and pick up a GoodRx coupon card.  - If you need your prescription sent electronically to a different pharmacy, notify our office through [facility] MyChart or by phone at [phone] option 4.     Si Usted Necesita Algo Despus de Su Visita  Tambin puede enviarnos un mensaje a travs de MyChart. Por lo general respondemos a los mensajes de MyChart en el transcurso de 1 a 2  das hbiles.  Para renovar recetas, por favor pida a su farmacia que se ponga en contacto con nuestra oficina. Nuestro nmero de fax es el [phone].  Si tiene un asunto urgente cuando la clnica est cerrada y que no puede esperar hasta el siguiente da hbil, puede llamar/localizar a su doctor(a) al nmero que aparece a continuacin.   Por favor, tenga en cuenta que aunque hacemos todo lo posible para estar disponibles para asuntos urgentes fuera del horario de oficina, no estamos disponibles las 24 horas del da, los 7 das de la semana.   Si tiene un problema urgente y no puede comunicarse con nosotros, puede optar por buscar atencin mdica  en el consultorio de su doctor(a), en una clnica privada, en un centro de atencin urgente o en una sala de emergencias.  Si tiene una emergencia mdica, por favor llame inmediatamente al 911 o vaya a la sala de emergencias.  Nmeros de bper  - Dr. Kowalski: [phone]  - Dra. Moye: [phone]  - Dra. Stewart: [phone]  En caso de inclemencias del tiempo, por favor llame a nuestra lnea principal al [phone] para una actualizacin sobre el estado de cualquier retraso o cierre.  Consejos para la medicacin en dermatologa: Por favor, guarde las cajas en las que vienen los medicamentos de uso tpico para ayudarle a seguir las instrucciones sobre dnde y cmo usarlos. Las farmacias generalmente imprimen las instrucciones del medicamento slo en las cajas y no directamente en los tubos del medicamento.   Si su medicamento es muy caro, por favor, pngase en contacto con nuestra oficina llamando al [phone] y presione la opcin 4 o envenos un mensaje a travs de MyChart.   No podemos decirle cul ser su copago por los medicamentos por adelantado ya que esto es diferente dependiendo de la cobertura de su seguro. Sin embargo, es posible que podamos encontrar un medicamento sustituto a menor costo o llenar un formulario para que el  seguro cubra el medicamento que se considera necesario.   Si se requiere una autorizacin previa para que su compaa de seguros cubra su medicamento, por favor permtanos de 1 a 2 das hbiles para completar este proceso.  Los precios de los medicamentos varan con frecuencia dependiendo del lugar de dnde se surte la receta y alguna farmacias pueden ofrecer precios ms baratos.  El sitio web www.goodrx.com tiene cupones para medicamentos de diferentes farmacias. Los precios aqu no tienen en cuenta lo que podra costar con la ayuda del seguro (puede ser ms barato con su seguro), pero el sitio web puede darle el precio si no utiliz ningn seguro.  - Puede imprimir el cupn correspondiente y llevarlo con su receta a la farmacia.  - Tambin puede pasar por nuestra oficina durante el horario de atencin regular y recoger una tarjeta de cupones de GoodRx.  - Si necesita que su receta se enve electrnicamente a una farmacia diferente, informe a nuestra oficina a travs de MyChart de [facility]   o por telfono llamando al [phone] y presione la opcin 4.  

## 2022-06-24 ENCOUNTER — Ambulatory Visit (INDEPENDENT_AMBULATORY_CARE_PROVIDER_SITE_OTHER): Payer: PPO | Admitting: Vascular Surgery

## 2022-06-24 ENCOUNTER — Ambulatory Visit (HOSPITAL_COMMUNITY)
Admission: RE | Admit: 2022-06-24 | Discharge: 2022-06-24 | Disposition: A | Discharge status: Home / Self Care | Payer: PPO | Source: Ambulatory Visit | Attending: Vascular Surgery | Admitting: Vascular Surgery

## 2022-06-24 ENCOUNTER — Encounter: Payer: Self-pay | Admitting: Vascular Surgery

## 2022-06-24 VITALS — BP 138/79 | HR 77 | Temp 98.1°F | Resp 20 | Ht 60.0 in | Wt 200.8 lb

## 2022-06-24 DIAGNOSIS — K551 Chronic vascular disorders of intestine: Secondary | ICD-10-CM | POA: Insufficient documentation

## 2022-06-24 NOTE — Progress Notes (Signed)
Patient ID: Alicia [name], female   DOB: [birth year], 76 y.o.   MRN: [id number]  Reason for Consult: Routine Post Op   Referred by [name], FNP  Subjective:     HPI:  Alicia [name] is a 76 y.o. 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  She is now on aspirin and Plavix with plan for shoulder surgery on September 28.  She states that she is not sure if she has been able to gain weight back but she is eating without any pain her bowels have returned to normal since the stent placement.  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   R mid forearm, [facility]   Family History  Problem Relation Age of Onset   Heart disease Mother    Cerebral aneurysm Father    Past Surgical History:  Procedure Laterality Date   APPENDECTOMY     CHOLECYSTECTOMY  10/2016   EYE SURGERY Bilateral 2017   cataracts removed   LUMBAR LAMINECTOMY/DECOMPRESSION MICRODISCECTOMY Left 07/21/2021   Procedure: Laminectomy and Foraminotomy - L4-L5 - left;  Surgeon: Kary Kos, MD;  Location: [facility];  Service: Neurosurgery;  Laterality: Left;  3C   PAROTID GLAND TUMOR EXCISION Left 2003   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   Procedure: PERIPHERAL VASCULAR INTERVENTION;  Surgeon: Waynetta Sandy, MD;  Location: [facility];  Service: Cardiovascular;;  celiac   VISCERAL ANGIOGRAPHY N/A 05/18/2022   Procedure: VISCERAL ANGIOGRAPHY;  Surgeon: Waynetta Sandy, MD;  Location: [facility];  Service: Cardiovascular;  Laterality: N/A;    Short Social History:  Social History   Tobacco Use   Smoking status: Every Day    Packs/day: 1.50    Years: 31.00    Total pack years: 46.50    Types: Cigarettes    Passive exposure: Current   Smokeless tobacco: Never   Tobacco comments:    Smokes a pack and a half a day [facility] 04/02/22  Substance Use Topics   Alcohol use: Not Currently    Allergies  Allergen Reactions   Penicillins Other (See Comments)    Yeast infection     Current Outpatient Medications  Medication Sig Dispense Refill   acetaminophen (TYLENOL) 500 MG tablet Take 1,000 mg by mouth every 6 (six) hours as needed (pain.).     albuterol (VENTOLIN HFA) 108 (90 Base) MCG/ACT inhaler Inhale 1-2 puffs into the lungs 4 (four) times daily as needed for shortness of breath or wheezing.     aspirin EC 81 MG tablet Take 81 mg by mouth every other day. In the morning     Budeson-Glycopyrrol-Formoterol (BREZTRI AEROSPHERE) 160-9-4.8 MCG/ACT AERO Inhale 2 puffs into the lungs 2 (two) times daily. (Patient taking differently: Inhale 2 puffs into the lungs in the morning.) 10.7 g 11   Cholecalciferol (VITAMIN D3) 125 MCG (5000 UT) CAPS Take 5,000 Units by mouth in the morning.     clopidogrel (PLAVIX)  75 MG tablet Take 1 tablet (75 mg total) by mouth daily. 30 tablet 11   [employer] TRIPLE MAGNESIUM COMPLEX PO Take 1 capsule by mouth at bedtime.     faricimab-svoa (VABYSMO) 6 MG/0.05ML SOLN intravitreal injection 6 mg by Intravitreal route every 8 (eight) weeks.     gabapentin (NEURONTIN) 300 MG capsule TAKE 1 CAPSULE BY MOUTH THREE TIMES A DAY 270 capsule 3   hydrocortisone 2.5 % cream Apply 1 Application topically daily as needed (rash/irritation). Apply to itchy rash on face qd up to 4 days a week prn flares     Lutein 20 MG TABS Take 20 mg by mouth in the morning.     omeprazole (PRILOSEC) 40 MG capsule Take 1 capsule (40 mg total) by mouth 2 (two) times daily. For 8 weeks. 180  capsule 0   pramipexole (MIRAPEX) 0.5 MG tablet TAKE 1 TABLET BY MOUTH TWICE A DAY 180 tablet 1   rosuvastatin (CRESTOR) 10 MG tablet Take 1 tablet (10 mg total) by mouth daily. 30 tablet 11   venlafaxine XR (EFFEXOR-XR) 37.5 MG 24 hr capsule Take 1 capsule (37.5 mg total) by mouth at bedtime. 90 capsule 1   No current facility-administered medications for this visit.    Review of Systems  Constitutional:  Constitutional negative. HENT: HENT negative.  Eyes: Eyes negative.  Cardiovascular: Cardiovascular negative.  GI: Gastrointestinal negative.  Musculoskeletal: Positive for joint pain.  Skin: Skin negative.  Neurological: Neurological negative. Hematologic: Positive for bruises/bleeds easily.  Psychiatric: Psychiatric negative.        Objective:  Objective   Vitals:   06/24/22 0855  BP: 138/79  Pulse: 77  Resp: 20  Temp: 98.1 F (36.7 C)  SpO2: 93%  Weight: 200 lb 12.8 oz (91.1 kg)  Height: 5' (1.524 m)   Body mass index is 39.22 kg/m.  Physical Exam HENT:     Head: Normocephalic.     Nose: Nose normal.  Eyes:     Pupils: Pupils are equal, round, and reactive to light.  Cardiovascular:     Pulses: Normal pulses.  Abdominal:     General: Abdomen is flat.     Palpations: Abdomen is soft. There is no mass.     Comments: No bruit  Musculoskeletal:        General: Normal range of motion.     Cervical back: Normal range of motion and neck supple.     Right lower leg: No edema.     Left lower leg: No edema.  Skin:    General: Skin is warm.     Capillary Refill: Capillary refill takes less than 2 seconds.     Findings: No lesion or rash.  Neurological:     Mental Status: She is alert.  Psychiatric:        Mood and Affect: Mood normal.        Behavior: Behavior normal.        Thought Content: Thought content normal.        Judgment: Judgment normal.     Data: Summary:  Mesenteric:  Normal Superior Mesenteric artery, Splenic artery and Hepatic artery   findings.  70 to 99% stenosis in the celiac artery.  Distal Celiac Axis stenosis >70% stenosis. IMA was not visualized. Exam  technically difficult due to bowel gas.      Assessment/Plan:    76 year old female with known celiac and SMA stenosis now status post celiac artery stent.  She does have stenosis of the celiac  artery but this is likely due to heavy calcific shadowing given that her symptoms have improved I would not recommend any repeat intervention.  She does have a possible SMA that would be amenable to stenting in the future if necessary but also has a large GDA that fill the SMA off of the celiac and completion angiogram.  Given that her symptoms have improved and she is eating without pain at this time it is reasonable to continue to watch her I will see her back in 6 months with repeat mesenteric duplex unless she has issues prior.  She will continue Plavix until 1 week prior to her shoulder surgery which should be September 21 and then she does not need to restart afterwards but will continue on aspirin and statin.     Waynetta Sandy MD [facility]

## 2022-06-26 ENCOUNTER — Other Ambulatory Visit: Payer: Self-pay

## 2022-06-26 DIAGNOSIS — K551 Chronic vascular disorders of intestine: Secondary | ICD-10-CM

## 2022-07-02 ENCOUNTER — Ambulatory Visit: Payer: PPO | Admitting: Internal Medicine

## 2022-07-02 ENCOUNTER — Encounter: Payer: Self-pay | Admitting: Internal Medicine

## 2022-07-02 VITALS — BP 120/60 | HR 103 | Ht 60.0 in | Wt 200.0 lb

## 2022-07-02 DIAGNOSIS — R140 Abdominal distension (gaseous): Secondary | ICD-10-CM

## 2022-07-02 DIAGNOSIS — K2970 Gastritis, unspecified, without bleeding: Secondary | ICD-10-CM | POA: Diagnosis not present

## 2022-07-02 DIAGNOSIS — K449 Diaphragmatic hernia without obstruction or gangrene: Secondary | ICD-10-CM

## 2022-07-02 DIAGNOSIS — D509 Iron deficiency anemia, unspecified: Secondary | ICD-10-CM | POA: Diagnosis not present

## 2022-07-02 DIAGNOSIS — Z86010 Personal history of colonic polyps: Secondary | ICD-10-CM

## 2022-07-02 DIAGNOSIS — K2990 Gastroduodenitis, unspecified, without bleeding: Secondary | ICD-10-CM

## 2022-07-02 NOTE — Progress Notes (Signed)
Chief Complaint: IDA  HPI : 76 year old female with history of COPD, hiatal hernia, OSA presents for follow up of IDA  Interval History: She is overall doing well. She does feel like the stent placement into the celiac artery significantly helped with her symptoms. Dysphagia has improved. Her bowel habits have become regular. She is on average having one BM per day. She is still not having a solid BMs. Sometimes she thinks that she sees dark stools. She is not taking any oral iron supplements because they tend to constipate her. She has gotten IV iron infusions  in the past. Denies hematochezia. She is getting shoulder surgery soon and will come off of her Plavix. She will go to her hematologist soon to get her iron levels and blood counts checked  Wt Readings from Last 3 Encounters:  07/02/22 200 lb (90.7 kg)  06/24/22 200 lb 12.8 oz (91.1 kg)  06/16/22 200 lb 8 oz (90.9 kg)   Current Outpatient Medications  Medication Sig Dispense Refill   acetaminophen (TYLENOL) 500 MG tablet Take 1,000 mg by mouth every 6 (six) hours as needed (pain.).     albuterol (VENTOLIN HFA) 108 (90 Base) MCG/ACT inhaler Inhale 1-2 puffs into the lungs 4 (four) times daily as needed for shortness of breath or wheezing.     aspirin EC 81 MG tablet Take 81 mg by mouth every other day. In the morning     Budeson-Glycopyrrol-Formoterol (BREZTRI AEROSPHERE) 160-9-4.8 MCG/ACT AERO Inhale 2 puffs into the lungs 2 (two) times daily. (Patient taking differently: Inhale 2 puffs into the lungs in the morning.) 10.7 g 11   Cholecalciferol (VITAMIN D3) 125 MCG (5000 UT) CAPS Take 5,000 Units by mouth in the morning.     clopidogrel (PLAVIX) 75 MG tablet Take 1 tablet (75 mg total) by mouth daily. 30 tablet 11   CVS TRIPLE MAGNESIUM COMPLEX PO Take 1 capsule by mouth at bedtime.     faricimab-svoa (VABYSMO) 6 MG/0.05ML SOLN intravitreal injection 6 mg by Intravitreal route every 8 (eight) weeks.     gabapentin (NEURONTIN) 300 MG  capsule TAKE 1 CAPSULE BY MOUTH THREE TIMES A DAY 270 capsule 3   hydrocortisone 2.5 % cream Apply 1 Application topically daily as needed (rash/irritation). Apply to itchy rash on face qd up to 4 days a week prn flares     Lutein 20 MG TABS Take 20 mg by mouth in the morning.     omeprazole (PRILOSEC) 40 MG capsule Take 1 capsule (40 mg total) by mouth 2 (two) times daily. For 8 weeks. 180 capsule 0   pramipexole (MIRAPEX) 0.5 MG tablet TAKE 1 TABLET BY MOUTH TWICE A DAY 180 tablet 1   rosuvastatin (CRESTOR) 10 MG tablet Take 1 tablet (10 mg total) by mouth daily. 30 tablet 11   venlafaxine XR (EFFEXOR-XR) 37.5 MG 24 hr capsule Take 1 capsule (37.5 mg total) by mouth at bedtime. 90 capsule 1   No current facility-administered medications for this visit.   Review of Systems: All systems reviewed and negative except where noted in HPI.   Physical Exam: BP 120/60   Pulse (!) 103   Ht 5' (1.524 m)   Wt 200 lb (90.7 kg)   BMI 39.06 kg/m  Constitutional: Pleasant,well-developed, female in no acute distress. HEENT: Normocephalic and atraumatic. Conjunctivae are normal. No scleral icterus. Cardiovascular: Normal rate, regular rhythm.  Pulmonary/chest: Effort normal and breath sounds normal. No wheezing, rales or rhonchi. Abdominal: Soft, nondistended, nontender.  Bowel sounds active throughout. There are no masses palpable. No hepatomegaly. Extremities: No edema Neurological: Alert and oriented to person place and time. Skin: Skin is warm and dry. No rashes noted. Psychiatric: Normal mood and affect. Behavior is normal.  Labs 01/2022: Positive hemoccult. Low ferritin of 4.8. CBC with low Hb of 8.1. CMP nml.  Labs 02/2022: C dif negative. Giardia negative. TSH nml. CBC with low Hb of 11. Lipase low at 8. Amylase low at 24. H pylori IgG negative. Alpha gal panel negative.   Labs 04/2022: CBC with low Hb of 11.2. Low ferritin of 5  Abdominal U/S 02/13/22: IMPRESSION: 1. Unremarkable examination  of the liver and spleen. Status post cholecystectomy. 2.  No evidence of nephrolithiasis or hydronephrosis. 3.  Pancreas not clearly visualized due to bowel gas shadowing. 4. 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  CTA A/P w/contrast 03/05/22: IMPRESSION: VASCULAR 1. 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 2. 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 3. Moderate to severe stenosis at the origin of the superior mesenteric artery. NON-VASCULAR 1. Mild distal colonic diverticulosis. 2. Multiple chronic fracture deformities of the pelvis  Peripheral vascular catheterization 05/09/22: Findings: Celiac artery had a very tight 90% stenosis at the takeoff there was a very large GDA that seem to fill the SMA very briskly distally.  I elected to stent the celiac as the SMA is known to have long segment occlusion/stenosis.  At completion there was 0% residual stenosis.  EGD in 04/17/21 that showed irregular Z-line that was biopsied, distal and proximal esophageal biopsies taken for EoE, gastritis that was biopsied, small hiatal hernia.  Colonoscopy 05/01/21: Moderate diverticulosis in the left side of the colon. 3 mm polyp found and removed. Internal hemorrhoids. Path: Hyperplastic polyp  EGD 05/08/22: - White nummular lesions in esophageal mucosa. Biopsied. - Small hiatal hernia. - Multiple gastric polyps. Biopsied. - Gastritis. Biopsied. 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 - Biopsies were taken with a cold forceps for histology in the entire duodenum. Path: 1. Surgical [P], duodenal - DUODENAL MUCOSA WITH NO SPECIFIC HISTOPATHOLOGIC CHANGES - NEGATIVE FOR INCREASED INTRAEPITHELIAL LYMPHOCYTES OR VILLOUS ARCHITECTURAL CHANGES 2. Surgical [P], gastric - GASTRIC ANTRAL MUCOSA WITH NONSPECIFIC REACTIVE GASTROPATHY - GASTRIC  OXYNTIC MUCOSA WITH PARIETAL CELL HYPERPLASIA AS CAN BE SEEN IN HYPERGASTRINEMIC STATES SUCH AS PPI THERAPY. - HELICOBACTER PYLORI-LIKE ORGANISMS ARE NOT IDENTIFIED ON ROUTINE H&E STAIN 3. Surgical [P], gastric polyp - FUNDIC GLAND POLYP(S) - NEGATIVE FOR DYSPLASIA 4. Surgical [P], esophagus - ESOPHAGEAL SQUAMOUS MUCOSA WITH NO SPECIFIC HISTOPATHOLOGIC CHANGES - NEGATIVE FOR INCREASED INTRAEPITHELIAL EOSINOPHILS  Colonoscopy 05/08/22: - Three 3 to 4 mm polyps in the transverse colon, removed with a cold snare. Resected and retrieved. - Diverticulosis in the sigmoid colon. - Non-bleeding internal hemorrhoids. - Biopsies were taken with a cold forceps from the entire colon for evaluation of microscopic colitis. Path: 5. Surgical [P], colon, transverse, polyp (3) - TUBULAR ADENOMA(S) WITHOUT HIGH-GRADE DYSPLASIA OR MALIGNANCY - HYPERPLASTIC POLYP 6. Surgical [P], colon nos, random sites - COLONIC MUCOSA WITH NO SPECIFIC HISTOPATHOLOGIC CHANGES - NEGATIVE FOR ACUTE INFLAMMATION, INCREASED INTRAEPITHELIAL LYMPHOCYTES OR THICKENED SUBEPITHELIAL COLLAGEN TABLE  ASSESSMENT AND PLAN: IDA Possible melena Bloating History of colon polyps History of mesenteric ischemia s/p stenting Patient for the most part is doing much better on a GI standpoint. Her last blood counts showed  improvement in her Hb. Her last iron levels were still quite low. Her PPI therapy should have helped the gastritis heal. Will go ahead and decrease her omeprazole down to QD dosing. She is following with hematology for IV iron infusions. If she demonstrates drops in her Hb in the future or continued low iron levels, would consider SBE at the hospital in order to perform APC of the small bowel angioectasias seen on her last EGD. - Low FODMAP diet - Decrease omeprazole from 40 mg BID to QD - Patient will see hematologist soon to get her CBC and ferritin rechecked. Patient will let me know what the labs show. - Next colonoscopy  due in 04/2029  Christia Reading, MD

## 2022-07-02 NOTE — Patient Instructions (Signed)
Decrease Omeprazole 40 mg daily 30-60 minutes before breakfast.   Low FODmap Diet handout provided.   _______________________________________________________  If you are age 77 or older, your body mass index should be between 23-30. Your Body mass index is 39.06 kg/m. If this is out of the aforementioned range listed, please consider follow up with your Primary Care Provider.  If you are age 29 or younger, your body mass index should be between 19-25. Your Body mass index is 39.06 kg/m. If this is out of the aformentioned range listed, please consider follow up with your Primary Care Provider.   ________________________________________________________  The [facility] providers would like to encourage you to use [facility] to communicate with providers for non-urgent requests or questions.  Due to long hold times on the telephone, sending your provider a message by [facility] may be a faster and more efficient way to get a response.  Please allow 48 business hours for a response.  Please remember that this is for non-urgent requests.  _______________________________________________________

## 2022-07-08 ENCOUNTER — Other Ambulatory Visit: Payer: Self-pay

## 2022-07-15 ENCOUNTER — Ambulatory Visit: Payer: PPO | Admitting: Dermatology

## 2022-07-17 ENCOUNTER — Encounter: Payer: Self-pay | Admitting: Orthopedic Surgery

## 2022-07-20 ENCOUNTER — Inpatient Hospital Stay: Payer: PPO | Attending: Oncology

## 2022-07-20 DIAGNOSIS — F17210 Nicotine dependence, cigarettes, uncomplicated: Secondary | ICD-10-CM | POA: Diagnosis not present

## 2022-07-20 DIAGNOSIS — R110 Nausea: Secondary | ICD-10-CM | POA: Diagnosis not present

## 2022-07-20 DIAGNOSIS — K5900 Constipation, unspecified: Secondary | ICD-10-CM | POA: Diagnosis not present

## 2022-07-20 DIAGNOSIS — D509 Iron deficiency anemia, unspecified: Secondary | ICD-10-CM | POA: Insufficient documentation

## 2022-07-20 DIAGNOSIS — G8929 Other chronic pain: Secondary | ICD-10-CM | POA: Insufficient documentation

## 2022-07-20 DIAGNOSIS — M25519 Pain in unspecified shoulder: Secondary | ICD-10-CM | POA: Diagnosis not present

## 2022-07-20 LAB — CBC WITH DIFFERENTIAL/PLATELET
Abs Immature Granulocytes: 0.02 10*3/uL (ref 0.00–0.07)
Basophils Absolute: 0.1 10*3/uL (ref 0.0–0.1)
Basophils Relative: 1 %
Eosinophils Absolute: 0.3 10*3/uL (ref 0.0–0.5)
Eosinophils Relative: 4 %
HCT: 28.9 % — ABNORMAL LOW (ref 36.0–46.0)
Hemoglobin: 8.8 g/dL — ABNORMAL LOW (ref 12.0–15.0)
Immature Granulocytes: 0 %
Lymphocytes Relative: 22 %
Lymphs Abs: 1.6 10*3/uL (ref 0.7–4.0)
MCH: 25.1 pg — ABNORMAL LOW (ref 26.0–34.0)
MCHC: 30.4 g/dL (ref 30.0–36.0)
MCV: 82.3 fL (ref 80.0–100.0)
Monocytes Absolute: 0.4 10*3/uL (ref 0.1–1.0)
Monocytes Relative: 6 %
Neutro Abs: 4.9 10*3/uL (ref 1.7–7.7)
Neutrophils Relative %: 67 %
Platelets: 239 10*3/uL (ref 150–400)
RBC: 3.51 MIL/uL — ABNORMAL LOW (ref 3.87–5.11)
RDW: 18.3 % — ABNORMAL HIGH (ref 11.5–15.5)
WBC: 7.2 10*3/uL (ref 4.0–10.5)
nRBC: 0 % (ref 0.0–0.2)

## 2022-07-20 LAB — IRON AND TIBC
Iron: 23 ug/dL — ABNORMAL LOW (ref 28–170)
Saturation Ratios: 5 % — ABNORMAL LOW (ref 10.4–31.8)
TIBC: 452 ug/dL — ABNORMAL HIGH (ref 250–450)
UIBC: 429 ug/dL

## 2022-07-20 LAB — FERRITIN: Ferritin: 5 ng/mL — ABNORMAL LOW (ref 11–307)

## 2022-07-20 NOTE — Progress Notes (Unsigned)
[facility]  [phone] [phone]  ID: Alicia [name] OB: Sep 13, 1946  MR#: [id number]  WLN#:[id number]  Patient Care Team: [name], FNP as PCP - General (Family Medicine)  CHIEF COMPLAINT: Iron deficiency anemia.  INTERVAL HISTORY: Patient returns to clinic today for repeat laboratory work, further evaluation, and consideration of additional IV Venofer.  She continues to have multiple GI issues including constipation and nausea that is actively being worked up by gastroenterology.  She otherwise feels well.  Her weakness and fatigue have improved.  Has no neurologic complaints.  She denies any recent fevers or illnesses.  She has a good appetite and denies weight loss.  She has no chest pain, shortness of breath, cough, or hemoptysis.  She denies any nausea, vomiting, or constipation.  But does admit to periodic diarrhea.  She has no melena or hematochezia.  She has no urinary complaints.  Patient offers no further specific complaints today.  REVIEW OF SYSTEMS:   Review of Systems  Constitutional: Negative.  Negative for fever, malaise/fatigue and weight loss.  Respiratory: Negative.  Negative for cough, hemoptysis and shortness of breath.   Cardiovascular: Negative.  Negative for chest pain and leg swelling.  Gastrointestinal:  Positive for constipation and nausea. Negative for abdominal pain, blood in stool, diarrhea and melena.  Genitourinary: Negative.  Negative for hematuria.  Musculoskeletal: Negative.  Negative for back pain.  Skin: Negative.  Negative for rash.  Neurological: Negative.  Negative for dizziness, focal weakness, weakness and headaches.    As per HPI. Otherwise, a complete review of systems is negative.  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   R mid forearm, [facility]    PAST SURGICAL HISTORY: Past Surgical History:  Procedure Laterality Date   APPENDECTOMY     CHOLECYSTECTOMY  10/2016   EYE SURGERY Bilateral 2017   cataracts removed   LUMBAR LAMINECTOMY/DECOMPRESSION MICRODISCECTOMY Left 07/21/2021   Procedure: Laminectomy and Foraminotomy - L4-L5 - left;  Surgeon: Kary Kos, MD;  Location: [facility];  Service: Neurosurgery;  Laterality: Left;  3C   PAROTID GLAND TUMOR EXCISION Left 2003   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   Procedure: PERIPHERAL VASCULAR INTERVENTION;  Surgeon: Waynetta Sandy, MD;  Location: [facility];  Service: Cardiovascular;;  celiac   VISCERAL ANGIOGRAPHY N/A 05/18/2022   Procedure: VISCERAL ANGIOGRAPHY;  Surgeon: Waynetta Sandy, MD;  Location: [facility];  Service: Cardiovascular;  Laterality: N/A;    FAMILY HISTORY: Family History  Problem Relation Age of Onset   Heart disease Mother    Cerebral aneurysm Father     ADVANCED DIRECTIVES (Y/N):  N  HEALTH MAINTENANCE: Social History   Tobacco Use   Smoking status: Every Day    Packs/day: 1.50    Years: 31.00    Total pack years: 46.50    Types: Cigarettes    Passive exposure: Current   Smokeless tobacco: Never   Tobacco comments:    Smokes a pack and a half a day [facility] 04/02/22  Vaping Use   Vaping Use: Never used  Substance Use Topics   Alcohol use: Not Currently   Drug use: Never  Colonoscopy:  PAP:  Bone density:  Lipid panel:  Allergies  Allergen Reactions   Penicillins Other (See Comments)    Yeast infection     Current Outpatient Medications  Medication Sig Dispense Refill   acetaminophen (TYLENOL) 500 MG tablet Take 1,000 mg by mouth every 6 (six) hours as needed (pain.).     albuterol (VENTOLIN HFA) 108 (90 Base) MCG/ACT inhaler Inhale 1-2 puffs into the lungs 4 (four)  times daily as needed for shortness of breath or wheezing.     aspirin EC 81 MG tablet Take 81 mg by mouth every other day. In the morning     Budeson-Glycopyrrol-Formoterol (BREZTRI AEROSPHERE) 160-9-4.8 MCG/ACT AERO Inhale 2 puffs into the lungs 2 (two) times daily. (Patient taking differently: Inhale 2 puffs into the lungs in the morning.) 10.7 g 11   Cholecalciferol (VITAMIN D3) 125 MCG (5000 UT) CAPS Take 5,000 Units by mouth in the morning.     clopidogrel (PLAVIX) 75 MG tablet Take 1 tablet (75 mg total) by mouth daily. 30 tablet 11   [employer] TRIPLE MAGNESIUM COMPLEX PO Take 1 capsule by mouth at bedtime.     faricimab-svoa (VABYSMO) 6 MG/0.05ML SOLN intravitreal injection 6 mg by Intravitreal route every 8 (eight) weeks.     gabapentin (NEURONTIN) 300 MG capsule TAKE 1 CAPSULE BY MOUTH THREE TIMES A DAY 270 capsule 3   hydrocortisone 2.5 % cream Apply 1 Application topically daily as needed (rash/irritation). Apply to itchy rash on face qd up to 4 days a week prn flares     Lutein 20 MG TABS Take 20 mg by mouth in the morning.     omeprazole (PRILOSEC) 40 MG capsule Take 1 capsule (40 mg total) by mouth 2 (two) times daily. For 8 weeks. 180 capsule 0   pramipexole (MIRAPEX) 0.5 MG tablet TAKE 1 TABLET BY MOUTH TWICE A DAY 180 tablet 1   rosuvastatin (CRESTOR) 10 MG tablet Take 1 tablet (10 mg total) by mouth daily. 30 tablet 11   venlafaxine XR (EFFEXOR-XR) 37.5 MG 24 hr capsule Take 1 capsule (37.5 mg total) by mouth at bedtime. 90 capsule 1   No current facility-administered medications for this visit.    OBJECTIVE: There were no vitals filed for this visit.    There is no height or weight on file to calculate BMI.    ECOG FS:1 - Symptomatic but completely ambulatory  General: Well-developed, well-nourished, no acute distress. Eyes: Pink conjunctiva, anicteric sclera. HEENT: Normocephalic, moist mucous membranes. Lungs: No audible wheezing or coughing. Heart: Regular rate and  rhythm. Abdomen: Soft, nontender, no obvious distention. Musculoskeletal: No edema, cyanosis, or clubbing. Neuro: Alert, answering all questions appropriately. Cranial nerves grossly intact. Skin: No rashes or petechiae noted. Psych: Normal affect.   LAB RESULTS:  Lab Results  Component Value Date   NA 143 05/18/2022   K 3.6 05/18/2022   CL 105 05/18/2022   CO2 25 01/13/2022   GLUCOSE 88 05/18/2022   BUN 12 05/18/2022   CREATININE 0.60 05/18/2022   CALCIUM 9.9 01/13/2022   PROT 8.2 01/13/2022   ALBUMIN 4.4 01/13/2022   AST 17 01/13/2022   ALT 10 01/13/2022   ALKPHOS 112 01/13/2022   BILITOT 0.4 01/13/2022   GFRNONAA >60 07/10/2021    Lab Results  Component Value Date   WBC 7.2 07/20/2022   NEUTROABS 4.9 07/20/2022   HGB 8.8 (L) 07/20/2022   HCT 28.9 (L) 07/20/2022   MCV 82.3 07/20/2022   PLT 239 07/20/2022  Lab Results  Component Value Date   IRON 23 (L) 07/20/2022   TIBC 452 (H) 07/20/2022   IRONPCTSAT 5 (L) 07/20/2022   Lab Results  Component Value Date   FERRITIN 5 (L) 07/20/2022     STUDIES: VAS [place] MESENTERIC DUPLEX  Result Date: 06/24/2022 ABDOMINAL VISCERAL Patient Name:  LATORA QUARRY  Date of Exam:   06/24/2022 Medical Rec #: [id number]            Accession #:    [id number] Date of Birth: 15-Aug-1946           Patient Gender: F Patient Age:   76 years Exam Location:  [facility] Procedure:      VAS [place] MESENTERIC Referring Phys: [id number] [place] -------------------------------------------------------------------------------- High Risk Factors: Hyperlipidemia, current smoker. Vascular Interventions: 05/18/22 Celiac Axis stented by Dr Donzetta Matters. Comparison Study: Prior US 03/25/22 limited due to bowel gas, 70 to 99% stenosis                   in the celiac artery and superior mesenteric artery. Performing Technologist: Elta Guadeloupe RVT, RDMS  Examination Guidelines: A complete evaluation includes B-mode imaging, spectral Doppler,  color Doppler, and power Doppler as needed of all accessible portions of each vessel. Bilateral testing is considered an integral part of a complete examination. Limited examinations for reoccurring indications may be performed as noted.  Duplex Findings: +----------------------+--------+--------+-------+--------+ Mesenteric            PSV cm/sEDV cm/sPlaque Comments +----------------------+--------+--------+-------+--------+ Aorta Prox               49      0    diffuse         +----------------------+--------+--------+-------+--------+ Celiac Artery Origin    196                           +----------------------+--------+--------+-------+--------+ Celiac Artery Proximal  346      41                   +----------------------+--------+--------+-------+--------+ SMA Proximal            207      40                   +----------------------+--------+--------+-------+--------+ SMA Mid                 135      30                   +----------------------+--------+--------+-------+--------+ CHA                     168      50                   +----------------------+--------+--------+-------+--------+ Splenic                 156      41                   +----------------------+--------+--------+-------+--------+    Summary: Mesenteric: Normal Superior Mesenteric artery, Splenic artery and Hepatic artery findings. 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 IMA was not visualized. Exam technically difficult due to bowel gas.  *See table(s) above for measurements and observations.  Diagnosing physician: Servando Snare MD  Electronically signed by Servando Snare MD on 06/24/2022 at 1:35:59  PM.    Final     ASSESSMENT: Iron deficiency anemia.  PLAN:    1.  Iron deficiency anemia: Likely related to her ongoing GI issues.  Patient's hemoglobin has improved to 11.2, but her iron stores remain reduced.  Proceed with 200 mg IV Venofer today.   Return to clinic 2 additional times over the next week for additional IV iron.  Patient will then return to clinic in 3 months with repeat laboratory work, further evaluation, and continuation of treatment.   2.  Constipation/nausea: Continue evaluation and treatment per GI.  I spent a total of 30 minutes reviewing chart data, face-to-face evaluation with the patient, counseling and coordination of care as detailed above.   Patient expressed understanding and was in agreement with this plan. She also understands that She can call clinic at any time with any questions, concerns, or complaints.    Lloyd Huger, MD   07/20/2022 11:22 PM

## 2022-07-21 ENCOUNTER — Encounter: Payer: Self-pay | Admitting: Oncology

## 2022-07-21 ENCOUNTER — Inpatient Hospital Stay (HOSPITAL_BASED_OUTPATIENT_CLINIC_OR_DEPARTMENT_OTHER): Payer: PPO | Admitting: Oncology

## 2022-07-21 ENCOUNTER — Telehealth: Payer: Self-pay | Admitting: Internal Medicine

## 2022-07-21 ENCOUNTER — Inpatient Hospital Stay: Payer: PPO

## 2022-07-21 VITALS — BP 118/66 | HR 92 | Temp 97.7°F | Resp 18 | Ht 60.0 in | Wt 199.0 lb

## 2022-07-21 DIAGNOSIS — D509 Iron deficiency anemia, unspecified: Secondary | ICD-10-CM | POA: Diagnosis not present

## 2022-07-21 MED ORDER — SODIUM CHLORIDE 0.9 % IV SOLN
INTRAVENOUS | Status: DC
  Filled 2022-07-21: qty 250

## 2022-07-21 MED ORDER — SODIUM CHLORIDE 0.9 % IV SOLN
200.0000 mg | Freq: Once | INTRAVENOUS | Status: AC
  Administered 2022-07-21: 200 mg via INTRAVENOUS
  Filled 2022-07-21: qty 200

## 2022-07-21 NOTE — Telephone Encounter (Signed)
Patient would like a nurse to call her regarding her upcoming surgery.  She stated she is not sure if she should schedule an appt. Before the surgery or after.  Please advise and call patient to discuss at [phone]

## 2022-07-23 ENCOUNTER — Telehealth: Payer: Self-pay | Admitting: Orthopedic Surgery

## 2022-07-23 ENCOUNTER — Inpatient Hospital Stay: Payer: PPO

## 2022-07-23 VITALS — BP 126/60 | HR 104 | Temp 97.4°F | Resp 22

## 2022-07-23 DIAGNOSIS — D509 Iron deficiency anemia, unspecified: Secondary | ICD-10-CM | POA: Diagnosis not present

## 2022-07-23 MED ORDER — SODIUM CHLORIDE 0.9 % IV SOLN
INTRAVENOUS | Status: DC
  Filled 2022-07-23: qty 250

## 2022-07-23 MED ORDER — SODIUM CHLORIDE 0.9 % IV SOLN
200.0000 mg | Freq: Once | INTRAVENOUS | Status: AC
  Administered 2022-07-23: 200 mg via INTRAVENOUS
  Filled 2022-07-23: qty 200

## 2022-07-23 NOTE — Telephone Encounter (Signed)
Patient is scheduled for right reverse shoulder arthroplasty 08-06-22 and is calling wanting to know what she should wear coming home after surgery since she will be wearing a sling.  Should it be something to pull over the head or something that buttons up?  Patient also has questions about physical therapy coming to her home and transportation if necessary for her post op visits.  Patient can be reached at [phone]

## 2022-07-23 NOTE — Patient Instructions (Signed)
[facility] AT [place]  Discharge Instructions: Thank you for choosing [facility] to provide your oncology and hematology care.  If you have a lab appointment with the [facility], please go directly to the [facility] and check in at the registration area.  Wear comfortable clothing and clothing appropriate for easy access to any Portacath or PICC line.   We strive to give you quality time with your provider. You may need to reschedule your appointment if you arrive late (15 or more minutes).  Arriving late affects you and other patients whose appointments are after yours.  Also, if you miss three or more appointments without notifying the office, you may be dismissed from the clinic at the provider's discretion.      For prescription refill requests, have your pharmacy contact our office and allow 72 hours for refills to be completed.    Today you received the following chemotherapy and/or immunotherapy agents Iron Sucrose.      To help prevent nausea and vomiting after your treatment, we encourage you to take your nausea medication as directed.  BELOW ARE SYMPTOMS THAT SHOULD BE REPORTED IMMEDIATELY: *FEVER GREATER THAN 100.4 F (38 C) OR HIGHER *CHILLS OR SWEATING *NAUSEA AND VOMITING THAT IS NOT CONTROLLED WITH YOUR NAUSEA MEDICATION *UNUSUAL SHORTNESS OF BREATH *UNUSUAL BRUISING OR BLEEDING *URINARY PROBLEMS (pain or burning when urinating, or frequent urination) *BOWEL PROBLEMS (unusual diarrhea, constipation, pain near the anus) TENDERNESS IN MOUTH AND THROAT WITH OR WITHOUT PRESENCE OF ULCERS (sore throat, sores in mouth, or a toothache) UNUSUAL RASH, SWELLING OR PAIN  UNUSUAL VAGINAL DISCHARGE OR ITCHING   Items with * indicate a potential emergency and should be followed up as soon as possible or go to the Emergency Department if any problems should occur.  Please show the CHEMOTHERAPY ALERT CARD or IMMUNOTHERAPY ALERT CARD at check-in  to the Emergency Department and triage nurse.  Should you have questions after your visit or need to cancel or reschedule your appointment, please contact [facility] [place] AT [place]  [phone] and follow the prompts.  Office hours are 8:00 a.m. to 4:30 p.m. Monday - Friday. Please note that voicemails left after 4:00 p.m. may not be returned until the following business day.  We are closed weekends and major holidays. You have access to a nurse at all times for urgent questions. Please call the main number to the clinic [phone] and follow the prompts.  For any non-urgent questions, you may also contact your provider using MyChart. We now offer e-Visits for anyone 37 and older to request care online for non-urgent symptoms. For details visit [email or web address].   Also download the MyChart app! Go to the app store, search "MyChart", open the app, select [facility], and log in with your MyChart username and password.  Masks are optional in the cancer centers. If you would like for your care team to wear a mask while they are taking care of you, please let them know. For doctor visits, patients may have with them one support person who is at least 77 years old. At this time, visitors are not allowed in the infusion area.  Iron Sucrose Injection What is this medication? IRON SUCROSE (EYE ern SOO krose) treats low levels of iron (iron deficiency anemia) in people with kidney disease. Iron is a mineral that plays an important role in making red blood cells, which carry oxygen from your lungs to the rest of your body. This medicine may  be used for other purposes; ask your health care provider or pharmacist if you have questions. COMMON BRAND NAME(S): Venofer What should I tell my care team before I take this medication? They need to know if you have any of these conditions: Anemia not caused by low iron levels Heart disease High levels of iron in the blood Kidney  disease Liver disease An unusual or allergic reaction to iron, other medications, foods, dyes, or preservatives Pregnant or trying to get pregnant Breast-feeding How should I use this medication? This medication is for infusion into a vein. It is given in a hospital or clinic setting. Talk to your care team about the use of this medication in children. While this medication may be prescribed for children as young as 2 years for selected conditions, precautions do apply. Overdosage: If you think you have taken too much of this medicine contact a poison control center or emergency room at once. NOTE: This medicine is only for you. Do not share this medicine with others. What if I miss a dose? It is important not to miss your dose. Call your care team if you are unable to keep an appointment. What may interact with this medication? Do not take this medication with any of the following: Deferoxamine Dimercaprol Other iron products This medication may also interact with the following: Chloramphenicol Deferasirox This list may not describe all possible interactions. Give your health care provider a list of all the medicines, herbs, non-prescription drugs, or dietary supplements you use. Also tell them if you smoke, drink alcohol, or use illegal drugs. Some items may interact with your medicine. What should I watch for while using this medication? Visit your care team regularly. Tell your care team if your symptoms do not start to get better or if they get worse. You may need blood work done while you are taking this medication. You may need to follow a special diet. Talk to your care team. Foods that contain iron include: whole grains/cereals, dried fruits, beans, or peas, leafy green vegetables, and organ meats (liver, kidney). What side effects may I notice from receiving this medication? Side effects that you should report to your care team as soon as possible: Allergic reactions--skin rash,  itching, hives, swelling of the face, lips, tongue, or throat Low blood pressure--dizziness, feeling faint or lightheaded, blurry vision Shortness of breath Side effects that usually do not require medical attention (report to your care team if they continue or are bothersome): Flushing Headache Joint pain Muscle pain Nausea Pain, redness, or irritation at injection site This list may not describe all possible side effects. Call your doctor for medical advice about side effects. You may report side effects to FDA at 1-800-FDA-1088. Where should I keep my medication? This medication is given in a hospital or clinic and will not be stored at home. NOTE: This sheet is a summary. It may not cover all possible information. If you have questions about this medicine, talk to your doctor, pharmacist, or health care provider.  2023 Elsevier/Gold Standard (2007-12-17 00:00:00)

## 2022-07-24 ENCOUNTER — Inpatient Hospital Stay: Payer: PPO

## 2022-07-24 NOTE — Telephone Encounter (Signed)
Called and spoke to patient and advised her that she can call and follow up after her surgery. Nothing further needed

## 2022-07-28 ENCOUNTER — Inpatient Hospital Stay: Payer: PPO

## 2022-07-28 ENCOUNTER — Encounter: Payer: Self-pay | Admitting: Surgical

## 2022-07-28 VITALS — BP 130/63 | HR 71 | Temp 98.6°F | Resp 20

## 2022-07-28 DIAGNOSIS — D509 Iron deficiency anemia, unspecified: Secondary | ICD-10-CM

## 2022-07-28 MED ORDER — SODIUM CHLORIDE 0.9 % IV SOLN
200.0000 mg | Freq: Once | INTRAVENOUS | Status: AC
  Administered 2022-07-28: 200 mg via INTRAVENOUS
  Filled 2022-07-28: qty 200

## 2022-07-28 MED ORDER — SODIUM CHLORIDE 0.9 % IV SOLN
INTRAVENOUS | Status: DC
  Filled 2022-07-28: qty 250

## 2022-07-28 NOTE — Progress Notes (Signed)
Surgical Instructions    Your procedure is scheduled on Thursday September 28th.  Report to [facility] Main Entrance "A" at 10:15 A.M., then check in with the Admitting office.  Call this number if you have problems the morning of surgery:  [phone]   If you have any questions prior to your surgery date call [phone]: Open Monday-Friday 8am-4pm    Remember:  Do not eat after midnight the night before your surgery  You may drink clear liquids until 9:15am the morning of your surgery.   Clear liquids allowed are: Water, Non-Citrus Juices (without pulp), Carbonated Beverages, Clear Tea, Black Coffee ONLY (NO MILK, CREAM OR POWDERED CREAMER of any kind), and Gatorade   Enhanced Recovery after Surgery for Orthopedics Enhanced Recovery after Surgery is a protocol used to improve the stress on your body and your recovery after surgery.  Patient Instructions  The day of surgery (if you do NOT have diabetes):  Drink ONE (1) Pre-Surgery Clear Ensure by __9:15___ am the morning of surgery   This drink was given to you during your hospital  pre-op appointment visit. Nothing else to drink after completing the  Pre-Surgery Clear Ensure.          If you have questions, please contact your surgeon's office.     Take these medicines the morning of surgery with A SIP OF WATER: Budeson-Glycopyrrol-Formoterol (BREZTRI AEROSPHERE) 160-9-4.8 MCG/ACT AERO gabapentin (NEURONTIN) 300 MG capsule omeprazole (PRILOSEC) 40 MG capsule pramipexole (MIRAPEX) 0.5 MG tablet  IF NEEDED  acetaminophen (TYLENOL) 500 MG tablet albuterol (VENTOLIN HFA) 108 (90 Base) MCG/ACT inhaler - please bring with you to the rosuvastatin (CRESTOR) 10 MG tabletospital  Follow your surgeon's instructions on when to stop Plavix.  If no instructions were given by your surgeon then you will need to call the office to get those instructions.    Follow your surgeon's instructions on when to stop Aspirin.  If no  instructions were given by your surgeon then you will need to call the office to get those instructions.    As of today, STOP taking any Aspirin (unless otherwise instructed by your surgeon) Aleve, Naproxen, Ibuprofen, Motrin, Advil, Goody's, BC's, all herbal medications, fish oil, and all vitamins.           Do not wear jewelry. Do not wear lotions, powders, cologne or deodorant. Do not shave 48 hours prior to surgery.  Men may shave face and neck. Do not bring valuables to the hospital. Do not wear nail polish, gel polish, artificial nails, or any other type of covering on natural nails (fingers and toes) If you have artificial nails or gel coating that need to be removed by a nail salon, please have this removed prior to surgery. Artificial nails or gel coating may interfere with anesthesia's ability to adequately monitor your vital signs.  [facility] is not responsible for any belongings or valuables.    Do NOT Smoke (Tobacco/Vaping)  24 hours prior to your procedure  If you use a CPAP at night, you may bring your mask for your overnight stay.   Contacts, glasses, hearing aids, dentures or partials may not be worn into surgery, please bring cases for these belongings   For patients admitted to the hospital, discharge time will be determined by your treatment team.   Patients discharged the day of surgery will not be allowed to drive home, and someone needs to stay with them for 24 hours.   SURGICAL WAITING ROOM VISITATION Patients having surgery  or a procedure may have no more than 2 support people in the waiting area - these visitors may rotate.   Children under the age of 73 must have an adult with them who is not the patient. If the patient needs to stay at the hospital during part of their recovery, the visitor guidelines for inpatient rooms apply. Pre-op nurse will coordinate an appropriate time for 1 support person to accompany patient in pre-op.  This support person may not  rotate.   Please refer to the [facility] website for the visitor guidelines for Inpatients (after your surgery is over and you are in a regular room).    Special instructions:    Oral Hygiene is also important to reduce your risk of infection.  Remember - BRUSH YOUR TEETH THE MORNING OF SURGERY WITH YOUR REGULAR TOOTHPASTE   [place]- Preparing For Surgery  Before surgery, you can play an important role. Because skin is not sterile, your skin needs to be as free of germs as possible. You can reduce the number of germs on your skin by washing with CHG (chlorahexidine gluconate) Soap before surgery.  CHG is an antiseptic cleaner which kills germs and bonds with the skin to continue killing germs even after washing.     Please do not use if you have an allergy to CHG or antibacterial soaps. If your skin becomes reddened/irritated stop using the CHG.  Do not shave (including legs and underarms) for at least 48 hours prior to first CHG shower. It is OK to shave your face.  Please follow these instructions carefully.    [place]- Preparing for Shoulder Surgery  ?  Before surgery, you can play an important role. Because skin is not sterile, your skin needs to be as free of germs as possible. You can reduce the number of germs on your skin by using the following products.   1). Benzoyl Peroxide Gel: reduces the number of germs present on the skin   *Applied twice a day to shoulder area starting two days before surgery     2). Chlorhexidine Gluconate (CHG) Soap: An antiseptic cleaner that kills germs and bonds with the skin to continue killing germs even after washing   *Used for showering the night before surgery and morning of surgery   ?    Please follow these instructions carefully:     1). BENZOYL PEROXIDE 5% GEL (Please do not use if you have an allergy to benzoyl peroxide.   If your skin becomes reddened/irritated stop using the benzoyl peroxide)     Starting TWO DAYS  BEFORE surgery:    Apply benzoyl peroxide in the morning and at night. Apply after taking a shower. If you are not taking a shower clean entire shoulder front, back, and side along with the armpit with a clean wet washcloth.     Place a quarter-sized amount of gel on your shoulder and rub in thoroughly, making sure to cover the front, back, and side of your shoulder, along with the armpit.                           Do this twice a day for two days.  (Last application is the night before surgery, AFTER using the CHG soap as described below).   Do NOT apply benzoyl peroxide gel on the day of surgery.   2 days before ____ AM   ____ PM  1 day before ____ AM   ____ PM    2) CHG Soap: Please do not use if you have an allergy to CHG or antibacterial soaps. If your skin becomes reddened/irritated stop using the CHG.  Do not shave (including legs and underarms) for at least 48 hours prior to first CHG shower. It is OK to shave your face.  Please follow these instructions carefully.   Shower the NIGHT BEFORE SURGERY (before applying benzoyl peroxide gel) and the MORNING OF SURGERY with CHG Soap.   If you chose to wash your hair, wash your hair first as usual with your normal shampoo.  After you shampoo, rinse your hair and body thoroughly to remove the shampoo.  Use CHG as you would any other liquid soap. You can apply CHG directly to the skin and wash gently with a scrungie or a clean washcloth.   Apply the CHG Soap to your body ONLY FROM THE NECK DOWN.  Do not use on open wounds or open sores. Avoid contact with your eyes, ears, mouth and genitals (private parts). Wash Face and genitals (private parts) with your normal soap.   Wash thoroughly, paying special attention to the area where your surgery will be performed.  Thoroughly rinse your body with warm water from the neck down.  DO NOT shower/wash with your normal soap after using and rinsing off the CHG Soap.  Pat yourself  dry with a CLEAN TOWEL.  Wear CLEAN PAJAMAS to bed the night before surgery  Place CLEAN SHEETS on your bed the night of your first shower and DO NOT SLEEP WITH PETS.  Oral Hygiene is also important to reduce your risk of infection.  Remember - BRUSH YOUR TEETH THE MORNING OF SURGERY WITH YOUR REGULAR TOOTHPASTE  Day of Surgery: Wear Clean/Comfortable clothing the morning of surgery Do not apply any deodorants/lotions.   Remember to brush your teeth WITH YOUR REGULAR TOOTHPASTE.    If you received a COVID test during your pre-op visit, it is requested that you wear a mask when out in public, stay away from anyone that may not be feeling well, and notify your surgeon if you develop symptoms. If you have been in contact with anyone that has tested positive in the last 10 days, please notify your surgeon.    Please read over the following fact sheets that you were given.

## 2022-07-28 NOTE — Patient Instructions (Signed)
[facility] CANCER CTR AT [place]  Discharge Instructions: Thank you for choosing [facility] to provide your oncology and hematology care.  If you have a lab appointment with the [facility], please go directly to the [facility] and check in at the registration area.  Wear comfortable clothing and clothing appropriate for easy access to any Portacath or PICC line.   We strive to give you quality time with your provider. You may need to reschedule your appointment if you arrive late (15 or more minutes).  Arriving late affects you and other patients whose appointments are after yours.  Also, if you miss three or more appointments without notifying the office, you may be dismissed from the clinic at the provider's discretion.      For prescription refill requests, have your pharmacy contact our office and allow 72 hours for refills to be completed.    Today you received the following chemotherapy and/or immunotherapy agents Iron Sucros      To help prevent nausea and vomiting after your treatment, we encourage you to take your nausea medication as directed.  BELOW ARE SYMPTOMS THAT SHOULD BE REPORTED IMMEDIATELY: *FEVER GREATER THAN 100.4 F (38 C) OR HIGHER *CHILLS OR SWEATING *NAUSEA AND VOMITING THAT IS NOT CONTROLLED WITH YOUR NAUSEA MEDICATION *UNUSUAL SHORTNESS OF BREATH *UNUSUAL BRUISING OR BLEEDING *URINARY PROBLEMS (pain or burning when urinating, or frequent urination) *BOWEL PROBLEMS (unusual diarrhea, constipation, pain near the anus) TENDERNESS IN MOUTH AND THROAT WITH OR WITHOUT PRESENCE OF ULCERS (sore throat, sores in mouth, or a toothache) UNUSUAL RASH, SWELLING OR PAIN  UNUSUAL VAGINAL DISCHARGE OR ITCHING   Items with * indicate a potential emergency and should be followed up as soon as possible or go to the Emergency Department if any problems should occur.  Please show the CHEMOTHERAPY ALERT CARD or IMMUNOTHERAPY ALERT CARD at check-in to  the Emergency Department and triage nurse.  Should you have questions after your visit or need to cancel or reschedule your appointment, please contact [facility] CANCER [place] AT [place]  [phone] and follow the prompts.  Office hours are 8:00 a.m. to 4:30 p.m. Monday - Friday. Please note that voicemails left after 4:00 p.m. may not be returned until the following business day.  We are closed weekends and major holidays. You have access to a nurse at all times for urgent questions. Please call the main number to the clinic [phone] and follow the prompts.  For any non-urgent questions, you may also contact your provider using MyChart. We now offer e-Visits for anyone 91 and older to request care online for non-urgent symptoms. For details visit [email or web address].   Also download the MyChart app! Go to the app store, search "MyChart", open the app, select [facility], and log in with your MyChart username and password.  Masks are optional in the cancer centers. If you would like for your care team to wear a mask while they are taking care of you, please let them know. For doctor visits, patients may have with them one support person who is at least 77 years old. At this time, visitors are not allowed in the infusion area.  Iron Sucrose Injection What is this medication? IRON SUCROSE (EYE ern SOO krose) treats low levels of iron (iron deficiency anemia) in people with kidney disease. Iron is a mineral that plays an important role in making red blood cells, which carry oxygen from your lungs to the rest of your body. This medicine may  be used for other purposes; ask your health care provider or pharmacist if you have questions. COMMON BRAND NAME(S): Venofer What should I tell my care team before I take this medication? They need to know if you have any of these conditions: Anemia not caused by low iron levels Heart disease High levels of iron in the blood Kidney  disease Liver disease An unusual or allergic reaction to iron, other medications, foods, dyes, or preservatives Pregnant or trying to get pregnant Breast-feeding How should I use this medication? This medication is for infusion into a vein. It is given in a hospital or clinic setting. Talk to your care team about the use of this medication in children. While this medication may be prescribed for children as young as 2 years for selected conditions, precautions do apply. Overdosage: If you think you have taken too much of this medicine contact a poison control center or emergency room at once. NOTE: This medicine is only for you. Do not share this medicine with others. What if I miss a dose? It is important not to miss your dose. Call your care team if you are unable to keep an appointment. What may interact with this medication? Do not take this medication with any of the following: Deferoxamine Dimercaprol Other iron products This medication may also interact with the following: Chloramphenicol Deferasirox This list may not describe all possible interactions. Give your health care provider a list of all the medicines, herbs, non-prescription drugs, or dietary supplements you use. Also tell them if you smoke, drink alcohol, or use illegal drugs. Some items may interact with your medicine. What should I watch for while using this medication? Visit your care team regularly. Tell your care team if your symptoms do not start to get better or if they get worse. You may need blood work done while you are taking this medication. You may need to follow a special diet. Talk to your care team. Foods that contain iron include: whole grains/cereals, dried fruits, beans, or peas, leafy green vegetables, and organ meats (liver, kidney). What side effects may I notice from receiving this medication? Side effects that you should report to your care team as soon as possible: Allergic reactions--skin rash,  itching, hives, swelling of the face, lips, tongue, or throat Low blood pressure--dizziness, feeling faint or lightheaded, blurry vision Shortness of breath Side effects that usually do not require medical attention (report to your care team if they continue or are bothersome): Flushing Headache Joint pain Muscle pain Nausea Pain, redness, or irritation at injection site This list may not describe all possible side effects. Call your doctor for medical advice about side effects. You may report side effects to FDA at 1-800-FDA-1088. Where should I keep my medication? This medication is given in a hospital or clinic and will not be stored at home. NOTE: This sheet is a summary. It may not cover all possible information. If you have questions about this medicine, talk to your doctor, pharmacist, or health care provider.  2023 Elsevier/Gold Standard (2007-12-17 00:00:00)

## 2022-07-28 NOTE — Telephone Encounter (Signed)
I called  and answered her questions. She wants HHPT to start after 2 weeks and said her insurance will cover this as well as covering a home health aide to assist with bathing, laundry, etc.  What is the process for this aide so she can have them assist her POD1 when she will likely be discharged from hospital?

## 2022-07-29 ENCOUNTER — Other Ambulatory Visit: Payer: Self-pay

## 2022-07-29 ENCOUNTER — Encounter (HOSPITAL_COMMUNITY)
Admission: RE | Admit: 2022-07-29 | Discharge: 2022-07-29 | Disposition: A | Discharge status: Home / Self Care | Payer: PPO | Source: Ambulatory Visit | Attending: Orthopedic Surgery | Admitting: Orthopedic Surgery

## 2022-07-29 ENCOUNTER — Encounter (HOSPITAL_COMMUNITY): Payer: Self-pay

## 2022-07-29 VITALS — BP 127/62 | HR 75 | Temp 98.2°F | Resp 17 | Ht 60.0 in | Wt 198.7 lb

## 2022-07-29 DIAGNOSIS — G4733 Obstructive sleep apnea (adult) (pediatric): Secondary | ICD-10-CM | POA: Insufficient documentation

## 2022-07-29 DIAGNOSIS — D509 Iron deficiency anemia, unspecified: Secondary | ICD-10-CM | POA: Diagnosis not present

## 2022-07-29 DIAGNOSIS — I6529 Occlusion and stenosis of unspecified carotid artery: Secondary | ICD-10-CM | POA: Insufficient documentation

## 2022-07-29 DIAGNOSIS — Z01812 Encounter for preprocedural laboratory examination: Secondary | ICD-10-CM | POA: Diagnosis not present

## 2022-07-29 DIAGNOSIS — K449 Diaphragmatic hernia without obstruction or gangrene: Secondary | ICD-10-CM | POA: Insufficient documentation

## 2022-07-29 DIAGNOSIS — K219 Gastro-esophageal reflux disease without esophagitis: Secondary | ICD-10-CM | POA: Insufficient documentation

## 2022-07-29 DIAGNOSIS — J449 Chronic obstructive pulmonary disease, unspecified: Secondary | ICD-10-CM | POA: Insufficient documentation

## 2022-07-29 DIAGNOSIS — Z01818 Encounter for other preprocedural examination: Secondary | ICD-10-CM

## 2022-07-29 HISTORY — DX: Stricture of artery: I77.1

## 2022-07-29 HISTORY — DX: Anemia, unspecified: D64.9

## 2022-07-29 HISTORY — DX: Unspecified osteoarthritis, unspecified site: M19.90

## 2022-07-29 HISTORY — DX: Gastro-esophageal reflux disease without esophagitis: K21.9

## 2022-07-29 HISTORY — DX: Depression, unspecified: F32.A

## 2022-07-29 HISTORY — DX: Celiac artery compression syndrome: I77.4

## 2022-07-29 LAB — URINALYSIS, ROUTINE W REFLEX MICROSCOPIC
Bilirubin Urine: NEGATIVE
Glucose, UA: NEGATIVE mg/dL
Hgb urine dipstick: NEGATIVE
Ketones, ur: NEGATIVE mg/dL
Leukocytes,Ua: NEGATIVE
Nitrite: NEGATIVE
Protein, ur: NEGATIVE mg/dL
Specific Gravity, Urine: 1.005 (ref 1.005–1.030)
pH: 8 (ref 5.0–8.0)

## 2022-07-29 LAB — BASIC METABOLIC PANEL
Anion gap: 6 (ref 5–15)
BUN: 8 mg/dL (ref 8–23)
CO2: 26 mmol/L (ref 22–32)
Calcium: 9.4 mg/dL (ref 8.9–10.3)
Chloride: 108 mmol/L (ref 98–111)
Creatinine, Ser: 0.63 mg/dL (ref 0.44–1.00)
GFR, Estimated: >60 mL/min (ref >60)
Glucose, Bld: 91 mg/dL (ref 70–99)
Potassium: 4.5 mmol/L (ref 3.5–5.1)
Sodium: 140 mmol/L (ref 135–145)

## 2022-07-29 LAB — CBC
HCT: 28.1 % — ABNORMAL LOW (ref 36.0–46.0)
Hemoglobin: 8.5 g/dL — ABNORMAL LOW (ref 12.0–15.0)
MCH: 26.1 pg (ref 26.0–34.0)
MCHC: 30.2 g/dL (ref 30.0–36.0)
MCV: 86.2 fL (ref 80.0–100.0)
Platelets: 222 10*3/uL (ref 150–400)
RBC: 3.26 MIL/uL — ABNORMAL LOW (ref 3.87–5.11)
RDW: 22 % — ABNORMAL HIGH (ref 11.5–15.5)
WBC: 6 10*3/uL (ref 4.0–10.5)
nRBC: 0 % (ref 0.0–0.2)

## 2022-07-29 LAB — SURGICAL PCR SCREEN
MRSA, PCR: NEGATIVE
Staphylococcus aureus: NEGATIVE

## 2022-07-29 MED FILL — Iron Sucrose Inj 20 MG/ML (Fe Equiv): INTRAVENOUS | Qty: 10 | Status: AC

## 2022-07-29 NOTE — Progress Notes (Signed)
PCP - [name], FNP Cardiologist - denies  PPM/ICD - denies   Chest x-ray - 12/26/21 EKG - 07/21/21 Stress Test - 10+ years ago per pt ECHO - 06/02/21 Cardiac Cath - denies  Sleep Study - 10/2020 OSA+ CPAP - nightly  DM- denies  Blood Thinner Instructions: Hold Plavix 1 week Aspirin Instructions: F/u with surgeon to see if needed to hold [place] - yes PRE-SURGERY Ensure given at PAT  COVID TEST- n/a   Anesthesia review: no  Patient denies shortness of breath, fever, cough and chest pain at PAT appointment   All instructions explained to the patient, with a verbal understanding of the material. Patient agrees to go over the instructions while at home for a better understanding.The opportunity to ask questions was provided.

## 2022-07-30 ENCOUNTER — Inpatient Hospital Stay: Payer: PPO

## 2022-07-30 ENCOUNTER — Telehealth: Payer: Self-pay | Admitting: Family

## 2022-07-30 ENCOUNTER — Other Ambulatory Visit: Payer: Self-pay

## 2022-07-30 VITALS — BP 143/55 | HR 72 | Temp 98.0°F | Resp 18

## 2022-07-30 DIAGNOSIS — D509 Iron deficiency anemia, unspecified: Secondary | ICD-10-CM

## 2022-07-30 LAB — URINE CULTURE: Culture: <10000 — AB

## 2022-07-30 MED ORDER — SODIUM CHLORIDE 0.9 % IV SOLN
200.0000 mg | Freq: Once | INTRAVENOUS | Status: AC
  Administered 2022-07-30: 200 mg via INTRAVENOUS
  Filled 2022-07-30: qty 200

## 2022-07-30 MED ORDER — SODIUM CHLORIDE 0.9 % IV SOLN
Freq: Once | INTRAVENOUS | Status: AC
  Filled 2022-07-30: qty 250

## 2022-07-30 MED ORDER — ALBUTEROL SULFATE HFA 108 (90 BASE) MCG/ACT IN AERS: 1.0000 | INHALATION_SPRAY | Freq: Four times a day (QID) | RESPIRATORY_TRACT | 0 refills | Status: DC | PRN

## 2022-07-30 NOTE — Progress Notes (Addendum)
Anesthesia Chart Review:  Case: [id number] Date/Time: 08/06/22 1200   Procedure: RIGHT REVERSE SHOULDER ARTHROPLASTY (Right: Shoulder)   Anesthesia type: General   Pre-op diagnosis: right rotator cuff arthropathy   Location: [facility] OR ROOM 07 / [place] [facility]   Surgeons: Meredith Pel, MD       DISCUSSION: Patient is a 76 year old female scheduled for the above procedure.   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 3.2 cm AAA 03/05/22 CTA with 3 year follow-up recommended.   Last visit with vascular surgeon Dr. Donzetta Matters was on 06/24/22, s/p celiac artery stent 05/18/22 and on ASA and Plavix. He notes plans for shoulder surgery and said that she could continue Plavix until 07/30/22 (a week before her surgery) then does not need to resume. Recommended she remain on ASA and statin therapy for mesenteric disease.    Last GI evaluation with Dr. Lorenso Courier was on 07/02/22. GI symptoms significantly improved after celiac artery stent. She also had recent EGD and colonoscopy on 05/08/22 that showed gastritis, small hiatal hernia, four non-bleeding angiectasias in the duodenum, moderate diverticulosis, and tubular adenomas and hyperplastic polyp of the transverse colon. Her ion levels were still quite low, so PPI dose lowered. Continue IV iron with hematology, but if she continues to have drops in her HGB or continued low iron levels, then "would consider SBE at the hospital in order to perform APC of the small bowel angioectasias seen on her last EGD.". Next colonoscopy due 04/2029.    She saw hematologist Dr. Grayland Ormond on 07/21/22 for follow-up iron deficiency anemia. He noted plans for shoulder surgery later is September. She reported fatigue. Labs showed HGB 8.8 (down from 14.3 by iSTAT8 on 05/18/22 and  11.2 by CBC on 04/20/22), HCT 28.9, MCV 82.3, iron 23 (L), TIBC 452 (H), Ferritin 5 (L). She received IV Venofer with plans to repeat x4 over two weeks. Follow-up in 3 months planned. (Venofer #1 07/21/22, #2 07/23/22, #3 07/28/22, #4 07/30/22, #5 scheduled for 08/03/22.)  She is not routinely followed by cardiology, but she had an echo in 06/2021 as part of a preoperative cardiology evaluation by Dr. Brigitte Pulse at [facility] prior to her 07/2021 back surgery.   Dr. Marlou Sa has reviewed PAT labs. He would prefer HGB be closer to 10 for surgery. She is scheduled for her 5th Venofer on 08/03/22, so timing of surgery may depend on how quickly she responds to iron. Would recommend a repeat CBC prior to surgery to re-evaluate if not done already through [facility]. (Her HGB is actually down further 8.8->8.5 since 07/20/22, but likely not seeing full effects of Venofer by that time. Her Plavix should be held starting 07/30/22, so perhaps that will help as well.)    ADDENDUM 08/04/22 11:44 AM:  Patient had CBC before her last Venofer infusion on 08/03/22. Her HGB is up to 9.5. Dr. Marlou Sa is okay proceeding with an improving HGB in this range. Ortho PA Luke had also ordered a Hold Clot for the Blood Bank, but since this was done at [facility], it will need to be repeated at [facility] on the day of surgery.    VS: BP 127/62   Pulse 75   Temp 36.8 C   Resp 17   Ht 5' (1.524 m)   Wt 90.1 kg   SpO2 97%   BMI  38.81 kg/m    PROVIDERS: [name], FNP is PCP  - Christinia Gully, MD is pulmonologist. Last visit 03/31/22.  Six month follow-up with PFTs planned.  Servando Snare, MD is vascular surgeon - Dayna Barker, MD is GI - Delight Hoh, MD is HEM. Last visit 07/21/22.  - She is not routinely followed by a cardiologist, but she had  a preoperative cardiology evaluation by Marton Redwood, MD with [facility] ~ 06/2021 prior to 07/21/22 spinal surgery.    LABS: Preoperative labs noted. See DISCUSSION. (all labs  ordered are listed, but only abnormal results are displayed)  Labs Reviewed  URINE CULTURE - Abnormal; Notable for the following components:      Result Value   Culture   (*)    Value: <10,000 COLONIES/mL INSIGNIFICANT GROWTH Performed at [facility], [address] [place], [place] [address]    All other components within normal limits  CBC - Abnormal; Notable for the following components:   RBC 3.26 (*)    Hemoglobin 8.5 (*)    HCT 28.1 (*)    RDW 22.0 (*)    All other components within normal limits  URINALYSIS, ROUTINE W REFLEX MICROSCOPIC - Abnormal; Notable for the following components:   Color, Urine STRAW (*)    All other components within normal limits  SURGICAL PCR SCREEN  BASIC METABOLIC PANEL    OTHER: Colonoscopy 05/08/22: IMPRESSION: - Three 3 to 4 mm polyps in the transverse colon, removed with a cold snare. Resected and retrieved. [Tubular adenomas without high grade dysplasia or malignancy, hyperplastic polyp] - Diverticulosis in the sigmoid colon. - Non-bleeding internal hemorrhoids. - Biopsies were taken with a cold forceps from the entire colon for evaluation of microscopic colitis. [No specific histopathologic changes, negative of acute inflammation, increased intraepithelial lymphocytes or thickened subepithelial collagen table]  EGD 05/08/22: IMPRESSION: - White nummular lesions in esophageal mucosa. Biopsied.[No specific histopathologic changes, negative of increased intraepithelial eosinophils] - Small hiatal hernia. - Multiple gastric polyps. Biopsied. [Fundic gland polyp, negative for dysplasia] - Gastritis. Biopsied. [Nonspecific reactive gastropathy, gastric oxyntic mucosa with parietal cell hyperplasia, no H. Pylori organisms identified] - Four non-bleeding angioectasias in the duodenum. - Biopsies were taken with a cold forceps for histology in the entire duodenum. [No specific histopathologic changes, negative of increased intraepithelial  lymphocytes or villous architectural changes]  Splint Night Sleep Study 10/09/20 [facility]): IMPRESSION: Moderate obstructive sleep apnea hypopnea syndrome with an elevated AHI of 25.9 (normal < 5).  2. Hypoxemia, from apneas and hypopneas.  Normal sinus rhythm. Periodic limb movements of sleep.   IMAGES: [place] Mesenteric Artery 06/18/22: Summary:  Mesenteric:  Normal Superior Mesenteric artery, Splenic artery and Hepatic artery  findings.  70 to 99% stenosis in the celiac artery.  Distal Celiac Axis stenosis >70% stenosis. IMA was not visualized. Exam  technically difficult due to bowel gas.     CT Right Shoulder 06/15/22: IMPRESSION: 1. Complete tear of the supraspinatus and infraspinatus tendons. 2. Moderate osteoarthritis of the glenohumeral joint. 3. Stable 8 mm right upper lobe pulmonary nodule unchanged compared with 10/18/2021. Patient is already receiving annual lung cancer screening low-dose CT of the chest.   MRI Right Shoulder 06/02/22: IMPRESSION: 1. Full-thickness full width retracted ruptures of the supraspinatus and infraspinatus tendon with associated muscular atrophy. There is also muscular atrophy of the teres minor muscle. 2. Chronically ruptured biceps tendon. 3. Moderate to severe degenerative glenohumeral arthropathy. Moderate degenerative spurring in the [facility] joint. 4. Blunted  superior labrum, likely torn.   CTA Abd/pelvis 03/05/22: IMPRESSION: VASCULAR 1. 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 2. 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 3. Moderate to severe stenosis at the origin of the superior mesenteric artery. NON-VASCULAR 1. Mild distal colonic diverticulosis. 2. Multiple chronic fracture deformities of the pelvis. RECOMMENDATIONS: Recommend follow-up every 3 years. This recommendation follows ACR consensus guidelines: White Paper of the ACR Incidental  Findings Committee II on Vascular Findings. J Am Coll Radiol 2013; 10:789-794.  CT Chest LCS 03/02/22: IMPRESSION: 1. Lung-RADS 2, benign appearance or behavior. Continue annual screening with low-dose chest CT without contrast in 12 months. 2. Coronary artery calcifications. 3. Aortic Atherosclerosis (ICD10-I70.0) and Emphysema (ICD10-J43.9).   EKG: 07/21/21:  Normal sinus rhythm with sinus arrhythmia Cannot rule out Anterior infarct , age undetermined Abnormal ECG Confirmed by Kathlyn Sacramento [phone]) on 07/27/2021 4:27:59 PM   CV: Echo 06/02/2021 [facility]): 1.  LV size normal.  Mild concentric LVH.  Overall LV systolic function normal with a EF 55-60% 2.  LA normal in size and function  3.  RV normal in size and function. 4.  RA normal in size and function 5.  Mild aortic valve sclerosis without stenosis 6.  Mitral valve thickened with nodular degeneration.  No mitral regurgitation. 7.  Tricuspid valve appears structurally normal with normal function 8.  No pericardial effusion 9.  Aortic root, ascending aorta, and aortic arch are normal.     Carotid duplex 04/25/21:  - Right Carotid: Velocities in the right ICA are consistent with a 1-39% stenosis.  - Left Carotid: Velocities in the left ICA are consistent with a 1-39% stenosis.  - Vertebrals:  Bilateral vertebral arteries demonstrate antegrade flow.  - Subclavians: Normal flow hemodynamics were seen in bilateral subclavian arteries.    Reported a stress test > 10 years ago.  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   R mid forearm, [facility]    Past Surgical History:  Procedure Laterality Date   APPENDECTOMY     CHOLECYSTECTOMY  10/2016   EYE SURGERY Bilateral 2017   cataracts removed   LUMBAR LAMINECTOMY/DECOMPRESSION MICRODISCECTOMY Left 07/21/2021   Procedure: Laminectomy and Foraminotomy - L4-L5 - left;  Surgeon: Kary Kos, MD;  Location: [facility];  Service: Neurosurgery;  Laterality: Left;  3C   PAROTID GLAND TUMOR EXCISION Left 2003   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   Procedure: PERIPHERAL VASCULAR INTERVENTION;  Surgeon: Waynetta Sandy, MD;  Location: [facility];  Service: Cardiovascular;;  celiac   VISCERAL ANGIOGRAPHY N/A 05/18/2022   Procedure: VISCERAL ANGIOGRAPHY;  Surgeon: Waynetta Sandy, MD;  Location: [facility];  Service: Cardiovascular;  Laterality: N/A;    MEDICATIONS:  acetaminophen (TYLENOL) 500 MG tablet   albuterol (VENTOLIN HFA) 108 (90 Base) MCG/ACT inhaler   aspirin EC 81 MG tablet   Budeson-Glycopyrrol-Formoterol (BREZTRI AEROSPHERE) 160-9-4.8 MCG/ACT AERO   Cholecalciferol (VITAMIN  D3) 125 MCG (5000 UT) CAPS   clopidogrel (PLAVIX) 75 MG tablet   [facility] TRIPLE MAGNESIUM COMPLEX PO   faricimab-svoa (VABYSMO) 6 MG/0.05ML SOLN intravitreal injection   gabapentin (NEURONTIN) 300 MG capsule   hydrocortisone 2.5 % cream   Lutein 20 MG TABS   omeprazole (PRILOSEC) 40 MG capsule   pramipexole (MIRAPEX) 0.5 MG tablet   rosuvastatin (CRESTOR) 10 MG tablet   venlafaxine XR (EFFEXOR-XR) 37.5 MG 24 hr capsule   No current facility-administered medications for this encounter.    Myra Gianotti, PA-C Surgical Short Stay/Anesthesiology [facility] Phone [phone] [facility] Phone [phone] 07/30/2022 6:36 PM

## 2022-07-30 NOTE — Telephone Encounter (Signed)
  Encourage patient to contact the pharmacy for refills or they can request refills through [facility]  Did the patient contact the pharmacy: Yes  LAST APPOINTMENT DATE: 06/16/2022  NEXT APPOINTMENT DATE: 12/17/2022  MEDICATION: albuterol (VENTOLIN HFA) 108 (90 Base) MCG/ACT inhaler  Is the patient out of medication? No  PHARMACY: [id number] [place], [place] [address] Let patient know to contact pharmacy at the end of the day to make sure medication is ready.  Please notify patient to allow 48-72 hours to process

## 2022-07-30 NOTE — Progress Notes (Signed)
Not sure why her hemoglobin is so low but really needs to be closer to 10 to do elective surgery per recommendations.  5 months ago it was 11.2.  May need to get it rechecked before surgery next week.  Probably need to think about delaying that until her primary care provider can figure out why it so low.

## 2022-07-31 ENCOUNTER — Encounter: Payer: Self-pay | Admitting: Orthopedic Surgery

## 2022-07-31 ENCOUNTER — Telehealth: Payer: Self-pay | Admitting: Orthopedic Surgery

## 2022-07-31 NOTE — Progress Notes (Signed)
I called.  She feels well.  Lets check her hemoglobin on Monday when she gets her next infusion.  We can decide at that point.  She is going to call her hematologist to get them to check her hemoglobin on Monday

## 2022-07-31 NOTE — Telephone Encounter (Signed)
Patient called on triage line stating that she spoke with Dr.Dean this morning in regards to her Hgb and being too low. She contacted her hematologist about this and he is not in office today. The nurse questioned why we didn't order any additional labs. Pt unaware. She asked if you would send order to [facility], which is closer to her. (Surgery scheduled 08/06/22) Please advise.

## 2022-08-03 ENCOUNTER — Inpatient Hospital Stay: Payer: PPO

## 2022-08-03 ENCOUNTER — Telehealth: Payer: Self-pay | Admitting: Orthopedic Surgery

## 2022-08-03 ENCOUNTER — Telehealth: Payer: Self-pay | Admitting: *Deleted

## 2022-08-03 ENCOUNTER — Other Ambulatory Visit: Payer: Self-pay | Admitting: Surgical

## 2022-08-03 VITALS — BP 129/51 | HR 75 | Temp 97.9°F | Resp 18

## 2022-08-03 DIAGNOSIS — D509 Iron deficiency anemia, unspecified: Secondary | ICD-10-CM

## 2022-08-03 LAB — CBC WITH DIFFERENTIAL/PLATELET
Abs Immature Granulocytes: 0.02 10*3/uL (ref 0.00–0.07)
Basophils Absolute: 0.1 10*3/uL (ref 0.0–0.1)
Basophils Relative: 1 %
Eosinophils Absolute: 0.2 10*3/uL (ref 0.0–0.5)
Eosinophils Relative: 3 %
HCT: 31.5 % — ABNORMAL LOW (ref 36.0–46.0)
Hemoglobin: 9.5 g/dL — ABNORMAL LOW (ref 12.0–15.0)
Immature Granulocytes: 0 %
Lymphocytes Relative: 25 %
Lymphs Abs: 1.7 10*3/uL (ref 0.7–4.0)
MCH: 26.4 pg (ref 26.0–34.0)
MCHC: 30.2 g/dL (ref 30.0–36.0)
MCV: 87.5 fL (ref 80.0–100.0)
Monocytes Absolute: 0.5 10*3/uL (ref 0.1–1.0)
Monocytes Relative: 8 %
Neutro Abs: 4.2 10*3/uL (ref 1.7–7.7)
Neutrophils Relative %: 63 %
Platelets: 248 10*3/uL (ref 150–400)
RBC: 3.6 MIL/uL — ABNORMAL LOW (ref 3.87–5.11)
RDW: 24.3 % — ABNORMAL HIGH (ref 11.5–15.5)
WBC: 6.6 10*3/uL (ref 4.0–10.5)
nRBC: 0 % (ref 0.0–0.2)

## 2022-08-03 LAB — SAMPLE TO BLOOD BANK

## 2022-08-03 MED ORDER — SODIUM CHLORIDE 0.9 % IV SOLN
200.0000 mg | Freq: Once | INTRAVENOUS | Status: AC
  Administered 2022-08-03: 200 mg via INTRAVENOUS
  Filled 2022-08-03: qty 200

## 2022-08-03 MED ORDER — SODIUM CHLORIDE 0.9 % IV SOLN
Freq: Once | INTRAVENOUS | Status: AC
  Filled 2022-08-03: qty 250

## 2022-08-03 NOTE — Telephone Encounter (Signed)
I called and spoke with [facility].  I will put an order in for her to get a CBC done today.  I called her and told her to let the cancer center know of this and they can call me at our office here if there are any difficulties or they need the order to be put in a different way or something.  Recommended she start using the benzyl peroxide today as planned and then if we have to postpone surgery, I will call her and she can stop using it.  She was agreeable to this.  I will update her after we know more about her current hemoglobin status.

## 2022-08-03 NOTE — Telephone Encounter (Signed)
Rn spoke with Ander Purpura, NP. V/o obtained for cbc and hold tube to be drawn today prior to venofer infusion. I called and personally spoke with patient. Patient agreeable to come at 245 today for a cbc. She stated that her orthopedic provider is hesitant to do her surgery given her hgb. I returned phone call to [facility] at the orthopedic office. Made office aware that patient will have labs drawn today. Results will be available in epic. Lurena Joiner thanked me for returning his phone call.

## 2022-08-03 NOTE — Telephone Encounter (Signed)
Patient is scheduled for right reverse shoulder arthroplasty Thursday 08-06-22 at 12:15pm with Dr. Marlou Sa.  She is calling this morning to find out when to start the benzoyl peroxide since she doesn't know if she is having surgery or not.   Dr. Marlou Sa had called on Friday and noted patient felt well and planned to check her hemoglobin on Monday when she gets her next infusion and he would decide at that point. Patient's infusion is today at 3pm at [facility].  She wants to know if the order was put in so the cancer center would know to do this.  Patient's call back is [phone].

## 2022-08-03 NOTE — Telephone Encounter (Signed)
Call from [facility] asking if we could draw a CBC on patient when she comes in to our office today . She is having surgery Thursday and they need this drawn. Please advise

## 2022-08-04 ENCOUNTER — Ambulatory Visit: Payer: PPO

## 2022-08-04 ENCOUNTER — Other Ambulatory Visit: Payer: Self-pay | Admitting: Internal Medicine

## 2022-08-04 ENCOUNTER — Telehealth: Payer: Self-pay

## 2022-08-04 DIAGNOSIS — K2970 Gastritis, unspecified, without bleeding: Secondary | ICD-10-CM

## 2022-08-04 NOTE — Telephone Encounter (Signed)
Patient called triage. She is scheduled to have shoulder replacement on Thursday 08/06/2022. She said that she had lab work and wants to know if she is still able to move forward with surgery. Call back [phone]

## 2022-08-04 NOTE — Telephone Encounter (Signed)
I called and discussed with Minette Brine

## 2022-08-04 NOTE — Anesthesia Preprocedure Evaluation (Addendum)
Anesthesia Evaluation  Patient identified by MRN, date of birth, ID band Patient awake    Reviewed: Allergy & Precautions, NPO status , Patient's Chart, lab work & pertinent test results  History of Anesthesia Complications Negative for: history of anesthetic complications  Airway Mallampati: II  TM Distance: >3 FB Neck ROM: Full    Dental  (+) Edentulous Upper, Missing, Dental Advisory Given   Pulmonary sleep apnea and Continuous Positive Airway Pressure Ventilation , COPD,  COPD inhaler, Current SmokerPatient did not abstain from smoking.,    breath sounds clear to auscultation       Cardiovascular (-) angina+ Peripheral Vascular Disease   Rhythm:Regular Rate:Normal  05/2021 ECHO: EF 55-60%, normal LVF, normal RVF, no significant valvular abnormalities   Neuro/Psych Depression    GI/Hepatic Neg liver ROS, GERD  Medicated and Controlled,  Endo/Other  Morbid obesityBMI 38  Renal/GU negative Renal ROS     Musculoskeletal  (+) Arthritis ,   Abdominal (+) + obese,   Peds  Hematology  (+) Blood dyscrasia (Hb 9.5), anemia , plavix   Anesthesia Other Findings   Reproductive/Obstetrics                         Anesthesia Physical Anesthesia Plan  ASA: 3  Anesthesia Plan: General   Post-op Pain Management: Tylenol PO (pre-op)* and Regional block*   Induction: Intravenous  PONV Risk Score and Plan: 2 and Ondansetron and Dexamethasone  Airway Management Planned: Oral ETT  Additional Equipment: None  Intra-op Plan:   Post-operative Plan: Extubation in OR  Informed Consent: I have reviewed the patients History and Physical, chart, labs and discussed the procedure including the risks, benefits and alternatives for the proposed anesthesia with the patient or authorized representative who has indicated his/her understanding and acceptance.     Dental advisory given  Plan Discussed with: CRNA  and Surgeon  Anesthesia Plan Comments: (See PAT note written by Myra Gianotti, PA-C. F/U HGB 9.5 on 08/03/22 s/p Venofer infusions. For Hold Clot to Blood Bank on day of surgery.   Plan routine monitors, GETA with interscalene block for post op analgesia )     Anesthesia Quick Evaluation

## 2022-08-05 NOTE — Telephone Encounter (Signed)
done

## 2022-08-05 NOTE — Telephone Encounter (Signed)
Patient had her labs checked on 08/03/22

## 2022-08-06 ENCOUNTER — Other Ambulatory Visit: Payer: Self-pay

## 2022-08-06 ENCOUNTER — Observation Stay (HOSPITAL_COMMUNITY): Payer: PPO

## 2022-08-06 ENCOUNTER — Encounter (HOSPITAL_COMMUNITY)
Admission: RE | Disposition: A | Discharge status: Home / Self Care | Payer: Self-pay | Source: Home / Self Care | Attending: Orthopedic Surgery

## 2022-08-06 ENCOUNTER — Observation Stay (HOSPITAL_COMMUNITY)
Admission: RE | Admit: 2022-08-06 | Discharge: 2022-08-07 | Disposition: A | Discharge status: Home / Self Care | Payer: PPO | Attending: Orthopedic Surgery | Admitting: Orthopedic Surgery

## 2022-08-06 ENCOUNTER — Ambulatory Visit (HOSPITAL_BASED_OUTPATIENT_CLINIC_OR_DEPARTMENT_OTHER): Payer: PPO | Admitting: Vascular Surgery

## 2022-08-06 ENCOUNTER — Ambulatory Visit (HOSPITAL_COMMUNITY): Payer: PPO | Admitting: Vascular Surgery

## 2022-08-06 ENCOUNTER — Encounter (HOSPITAL_COMMUNITY): Payer: Self-pay | Admitting: Orthopedic Surgery

## 2022-08-06 DIAGNOSIS — F17210 Nicotine dependence, cigarettes, uncomplicated: Secondary | ICD-10-CM

## 2022-08-06 DIAGNOSIS — M19019 Primary osteoarthritis, unspecified shoulder: Secondary | ICD-10-CM

## 2022-08-06 DIAGNOSIS — M12811 Other specific arthropathies, not elsewhere classified, right shoulder: Secondary | ICD-10-CM

## 2022-08-06 DIAGNOSIS — Z96611 Presence of right artificial shoulder joint: Secondary | ICD-10-CM

## 2022-08-06 DIAGNOSIS — J45909 Unspecified asthma, uncomplicated: Secondary | ICD-10-CM | POA: Diagnosis not present

## 2022-08-06 DIAGNOSIS — M19011 Primary osteoarthritis, right shoulder: Secondary | ICD-10-CM | POA: Diagnosis not present

## 2022-08-06 DIAGNOSIS — Z85828 Personal history of other malignant neoplasm of skin: Secondary | ICD-10-CM | POA: Diagnosis not present

## 2022-08-06 DIAGNOSIS — Z79899 Other long term (current) drug therapy: Secondary | ICD-10-CM | POA: Insufficient documentation

## 2022-08-06 DIAGNOSIS — D509 Iron deficiency anemia, unspecified: Secondary | ICD-10-CM

## 2022-08-06 DIAGNOSIS — Z9989 Dependence on other enabling machines and devices: Secondary | ICD-10-CM

## 2022-08-06 DIAGNOSIS — Z7902 Long term (current) use of antithrombotics/antiplatelets: Secondary | ICD-10-CM | POA: Insufficient documentation

## 2022-08-06 DIAGNOSIS — J441 Chronic obstructive pulmonary disease with (acute) exacerbation: Secondary | ICD-10-CM | POA: Diagnosis not present

## 2022-08-06 DIAGNOSIS — G4733 Obstructive sleep apnea (adult) (pediatric): Secondary | ICD-10-CM | POA: Diagnosis not present

## 2022-08-06 DIAGNOSIS — Z01818 Encounter for other preprocedural examination: Secondary | ICD-10-CM

## 2022-08-06 DIAGNOSIS — J449 Chronic obstructive pulmonary disease, unspecified: Secondary | ICD-10-CM | POA: Diagnosis not present

## 2022-08-06 DIAGNOSIS — Z7982 Long term (current) use of aspirin: Secondary | ICD-10-CM | POA: Insufficient documentation

## 2022-08-06 HISTORY — PX: TOTAL SHOULDER ARTHROPLASTY: SHX126

## 2022-08-06 LAB — SAMPLE TO BLOOD BANK

## 2022-08-06 SURGERY — ARTHROPLASTY, SHOULDER, TOTAL
Anesthesia: General | Site: Shoulder | Laterality: Right

## 2022-08-06 MED ORDER — PRAMIPEXOLE DIHYDROCHLORIDE 0.25 MG PO TABS
0.5000 mg | ORAL_TABLET | Freq: Two times a day (BID) | ORAL | Status: DC
  Administered 2022-08-07: 0.5 mg via ORAL
  Filled 2022-08-06 (×2): qty 2

## 2022-08-06 MED ORDER — HYDROMORPHONE HCL 1 MG/ML IJ SOLN: 0.5000 mg | INTRAMUSCULAR | Status: DC | PRN

## 2022-08-06 MED ORDER — ACETAMINOPHEN 500 MG PO TABS
1000.0000 mg | ORAL_TABLET | Freq: Once | ORAL | Status: AC
  Administered 2022-08-06: 1000 mg via ORAL
  Filled 2022-08-06: qty 2

## 2022-08-06 MED ORDER — FENTANYL CITRATE (PF) 100 MCG/2ML IJ SOLN: 50.0000 ug | Freq: Once | INTRAMUSCULAR | Status: AC

## 2022-08-06 MED ORDER — PROPOFOL 500 MG/50ML IV EMUL
INTRAVENOUS | Status: DC | PRN
  Administered 2022-08-06: 50 ug/kg/min via INTRAVENOUS

## 2022-08-06 MED ORDER — MENTHOL 3 MG MT LOZG: 1.0000 | LOZENGE | OROMUCOSAL | Status: DC | PRN

## 2022-08-06 MED ORDER — TRANEXAMIC ACID-NACL 1000-0.7 MG/100ML-% IV SOLN
1000.0000 mg | INTRAVENOUS | Status: AC
  Administered 2022-08-06: 1000 mg via INTRAVENOUS
  Filled 2022-08-06: qty 100

## 2022-08-06 MED ORDER — PHENYLEPHRINE HCL-NACL 20-0.9 MG/250ML-% IV SOLN
INTRAVENOUS | Status: DC | PRN
  Administered 2022-08-06: 25 ug/min via INTRAVENOUS

## 2022-08-06 MED ORDER — 0.9 % SODIUM CHLORIDE (POUR BTL) OPTIME
TOPICAL | Status: DC | PRN
  Administered 2022-08-06 (×2): 500 mL

## 2022-08-06 MED ORDER — ONDANSETRON HCL 4 MG/2ML IJ SOLN
4.0000 mg | Freq: Four times a day (QID) | INTRAMUSCULAR | Status: DC | PRN
  Administered 2022-08-06: 4 mg via INTRAVENOUS
  Filled 2022-08-06: qty 2

## 2022-08-06 MED ORDER — POVIDONE-IODINE 7.5 % EX SOLN
Freq: Once | CUTANEOUS | Status: DC
  Filled 2022-08-06: qty 118

## 2022-08-06 MED ORDER — ACETAMINOPHEN 10 MG/ML IV SOLN
INTRAVENOUS | Status: AC
  Filled 2022-08-06: qty 100

## 2022-08-06 MED ORDER — BUPIVACAINE LIPOSOME 1.3 % IJ SUSP
INTRAMUSCULAR | Status: DC | PRN
  Administered 2022-08-06: 10 mL via PERINEURAL

## 2022-08-06 MED ORDER — CEFAZOLIN SODIUM-DEXTROSE 2-4 GM/100ML-% IV SOLN
2.0000 g | INTRAVENOUS | Status: AC
  Administered 2022-08-06: 2 g via INTRAVENOUS
  Filled 2022-08-06: qty 100

## 2022-08-06 MED ORDER — OXYCODONE HCL 5 MG PO TABS: 5.0000 mg | ORAL_TABLET | Freq: Once | ORAL | Status: DC | PRN

## 2022-08-06 MED ORDER — SODIUM CHLORIDE 0.9 % IV SOLN: INTRAVENOUS | Status: DC

## 2022-08-06 MED ORDER — PANTOPRAZOLE SODIUM 40 MG PO TBEC
40.0000 mg | DELAYED_RELEASE_TABLET | Freq: Every day | ORAL | Status: DC
  Administered 2022-08-07: 40 mg via ORAL
  Filled 2022-08-06: qty 1

## 2022-08-06 MED ORDER — MIDAZOLAM HCL 2 MG/2ML IJ SOLN: 0.5000 mg | Freq: Once | INTRAMUSCULAR | Status: DC | PRN

## 2022-08-06 MED ORDER — ONDANSETRON HCL 4 MG/2ML IJ SOLN
INTRAMUSCULAR | Status: AC
  Filled 2022-08-06: qty 2

## 2022-08-06 MED ORDER — ROCURONIUM BROMIDE 10 MG/ML (PF) SYRINGE
PREFILLED_SYRINGE | INTRAVENOUS | Status: DC | PRN
  Administered 2022-08-06: 40 mg via INTRAVENOUS
  Administered 2022-08-06: 60 mg via INTRAVENOUS

## 2022-08-06 MED ORDER — ALBUTEROL SULFATE (2.5 MG/3ML) 0.083% IN NEBU: 2.5000 mg | INHALATION_SOLUTION | Freq: Four times a day (QID) | RESPIRATORY_TRACT | Status: DC | PRN

## 2022-08-06 MED ORDER — OXYCODONE HCL 5 MG/5ML PO SOLN: 5.0000 mg | Freq: Once | ORAL | Status: DC | PRN

## 2022-08-06 MED ORDER — EPHEDRINE SULFATE-NACL 50-0.9 MG/10ML-% IV SOSY
PREFILLED_SYRINGE | INTRAVENOUS | Status: DC | PRN
  Administered 2022-08-06 (×2): 5 mg via INTRAVENOUS

## 2022-08-06 MED ORDER — SUGAMMADEX SODIUM 200 MG/2ML IV SOLN: INTRAVENOUS | Status: DC | PRN

## 2022-08-06 MED ORDER — VENLAFAXINE HCL ER 37.5 MG PO CP24
37.5000 mg | ORAL_CAPSULE | Freq: Every day | ORAL | Status: DC
  Administered 2022-08-06: 37.5 mg via ORAL
  Filled 2022-08-06 (×3): qty 1

## 2022-08-06 MED ORDER — ONDANSETRON HCL 4 MG PO TABS: 4.0000 mg | ORAL_TABLET | Freq: Four times a day (QID) | ORAL | Status: DC | PRN

## 2022-08-06 MED ORDER — ROCURONIUM BROMIDE 10 MG/ML (PF) SYRINGE
PREFILLED_SYRINGE | INTRAVENOUS | Status: AC
  Filled 2022-08-06: qty 20

## 2022-08-06 MED ORDER — ASPIRIN 81 MG PO TBEC: 81.0000 mg | DELAYED_RELEASE_TABLET | ORAL | Status: DC

## 2022-08-06 MED ORDER — VANCOMYCIN HCL 1000 MG IV SOLR
INTRAVENOUS | Status: DC | PRN
  Administered 2022-08-06: 1000 mg via TOPICAL

## 2022-08-06 MED ORDER — HYDROMORPHONE HCL 1 MG/ML IJ SOLN: 0.2500 mg | INTRAMUSCULAR | Status: DC | PRN

## 2022-08-06 MED ORDER — FENTANYL CITRATE (PF) 100 MCG/2ML IJ SOLN
INTRAMUSCULAR | Status: AC
  Administered 2022-08-06: 50 ug via INTRAVENOUS
  Filled 2022-08-06: qty 2

## 2022-08-06 MED ORDER — POVIDONE-IODINE 10 % EX SWAB
2.0000 | Freq: Once | CUTANEOUS | Status: AC
  Administered 2022-08-06: 2 via TOPICAL

## 2022-08-06 MED ORDER — FENTANYL CITRATE (PF) 250 MCG/5ML IJ SOLN
INTRAMUSCULAR | Status: DC | PRN
  Administered 2022-08-06: 25 ug via INTRAVENOUS
  Administered 2022-08-06 (×2): 50 ug via INTRAVENOUS
  Administered 2022-08-06: 25 ug via INTRAVENOUS

## 2022-08-06 MED ORDER — OXYCODONE HCL 5 MG PO TABS: 5.0000 mg | ORAL_TABLET | ORAL | Status: DC | PRN

## 2022-08-06 MED ORDER — LACTATED RINGERS IV SOLN: INTRAVENOUS | Status: DC

## 2022-08-06 MED ORDER — PROMETHAZINE HCL 25 MG/ML IJ SOLN: 6.2500 mg | INTRAMUSCULAR | Status: DC | PRN

## 2022-08-06 MED ORDER — MEPERIDINE HCL 25 MG/ML IJ SOLN: 6.2500 mg | INTRAMUSCULAR | Status: DC | PRN

## 2022-08-06 MED ORDER — PROPOFOL 10 MG/ML IV BOLUS
INTRAVENOUS | Status: DC | PRN
  Administered 2022-08-06: 100 mg via INTRAVENOUS
  Administered 2022-08-06: 50 mg via INTRAVENOUS

## 2022-08-06 MED ORDER — METOCLOPRAMIDE HCL 5 MG PO TABS: 5.0000 mg | ORAL_TABLET | Freq: Three times a day (TID) | ORAL | Status: DC | PRN

## 2022-08-06 MED ORDER — ACETAMINOPHEN 10 MG/ML IV SOLN
1000.0000 mg | Freq: Once | INTRAVENOUS | Status: AC
  Administered 2022-08-06: 1000 mg via INTRAVENOUS

## 2022-08-06 MED ORDER — GABAPENTIN 300 MG PO CAPS
300.0000 mg | ORAL_CAPSULE | Freq: Three times a day (TID) | ORAL | Status: DC
  Administered 2022-08-06 – 2022-08-07 (×2): 300 mg via ORAL
  Filled 2022-08-06 (×2): qty 1

## 2022-08-06 MED ORDER — ACETAMINOPHEN 325 MG PO TABS: 325.0000 mg | ORAL_TABLET | Freq: Four times a day (QID) | ORAL | Status: DC | PRN

## 2022-08-06 MED ORDER — CEFAZOLIN SODIUM-DEXTROSE 2-4 GM/100ML-% IV SOLN
2.0000 g | Freq: Three times a day (TID) | INTRAVENOUS | Status: DC
  Administered 2022-08-06 – 2022-08-07 (×2): 2 g via INTRAVENOUS
  Filled 2022-08-06 (×2): qty 100

## 2022-08-06 MED ORDER — ALBUTEROL SULFATE HFA 108 (90 BASE) MCG/ACT IN AERS: 1.0000 | INHALATION_SPRAY | Freq: Four times a day (QID) | RESPIRATORY_TRACT | Status: DC | PRN

## 2022-08-06 MED ORDER — METHOCARBAMOL 1000 MG/10ML IJ SOLN: 500.0000 mg | Freq: Four times a day (QID) | INTRAVENOUS | Status: DC | PRN

## 2022-08-06 MED ORDER — MIDAZOLAM HCL 2 MG/2ML IJ SOLN
INTRAMUSCULAR | Status: AC
  Administered 2022-08-06: 2 mg via INTRAVENOUS
  Filled 2022-08-06: qty 2

## 2022-08-06 MED ORDER — BUPIVACAINE-EPINEPHRINE (PF) 0.5% -1:200000 IJ SOLN
INTRAMUSCULAR | Status: DC | PRN
  Administered 2022-08-06: 10 mL via PERINEURAL

## 2022-08-06 MED ORDER — SUGAMMADEX SODIUM 200 MG/2ML IV SOLN
INTRAVENOUS | Status: DC | PRN
  Administered 2022-08-06: 357.6 mg via INTRAVENOUS

## 2022-08-06 MED ORDER — DEXAMETHASONE SODIUM PHOSPHATE 10 MG/ML IJ SOLN
INTRAMUSCULAR | Status: AC
  Filled 2022-08-06: qty 1

## 2022-08-06 MED ORDER — ORAL CARE MOUTH RINSE: 15.0000 mL | Freq: Once | OROMUCOSAL | Status: AC

## 2022-08-06 MED ORDER — KETOROLAC TROMETHAMINE 30 MG/ML IJ SOLN
INTRAMUSCULAR | Status: AC
  Filled 2022-08-06: qty 1

## 2022-08-06 MED ORDER — LIDOCAINE 2% (20 MG/ML) 5 ML SYRINGE
INTRAMUSCULAR | Status: AC
  Filled 2022-08-06: qty 5

## 2022-08-06 MED ORDER — MIDAZOLAM HCL 2 MG/2ML IJ SOLN: 2.0000 mg | Freq: Once | INTRAMUSCULAR | Status: AC

## 2022-08-06 MED ORDER — METHOCARBAMOL 500 MG PO TABS: 500.0000 mg | ORAL_TABLET | Freq: Four times a day (QID) | ORAL | Status: DC | PRN

## 2022-08-06 MED ORDER — LIDOCAINE 2% (20 MG/ML) 5 ML SYRINGE
INTRAMUSCULAR | Status: DC | PRN
  Administered 2022-08-06: 40 mg via INTRAVENOUS

## 2022-08-06 MED ORDER — DEXAMETHASONE SODIUM PHOSPHATE 10 MG/ML IJ SOLN
INTRAMUSCULAR | Status: DC | PRN
  Administered 2022-08-06: 10 mg via INTRAVENOUS

## 2022-08-06 MED ORDER — METOCLOPRAMIDE HCL 5 MG/ML IJ SOLN: 5.0000 mg | Freq: Three times a day (TID) | INTRAMUSCULAR | Status: DC | PRN

## 2022-08-06 MED ORDER — ONDANSETRON HCL 4 MG/2ML IJ SOLN
INTRAMUSCULAR | Status: DC | PRN
  Administered 2022-08-06: 4 mg via INTRAVENOUS

## 2022-08-06 MED ORDER — ACETAMINOPHEN 500 MG PO TABS
1000.0000 mg | ORAL_TABLET | Freq: Four times a day (QID) | ORAL | Status: DC
  Administered 2022-08-07 (×2): 1000 mg via ORAL
  Filled 2022-08-06 (×2): qty 2

## 2022-08-06 MED ORDER — DOCUSATE SODIUM 100 MG PO CAPS
100.0000 mg | ORAL_CAPSULE | Freq: Two times a day (BID) | ORAL | Status: DC
  Administered 2022-08-07: 100 mg via ORAL
  Filled 2022-08-06 (×2): qty 1

## 2022-08-06 MED ORDER — VANCOMYCIN HCL 1000 MG IV SOLR
INTRAVENOUS | Status: AC
  Filled 2022-08-06: qty 20

## 2022-08-06 MED ORDER — PROPOFOL 10 MG/ML IV BOLUS
INTRAVENOUS | Status: AC
  Filled 2022-08-06: qty 20

## 2022-08-06 MED ORDER — CLOPIDOGREL BISULFATE 75 MG PO TABS
75.0000 mg | ORAL_TABLET | Freq: Every day | ORAL | Status: DC
  Filled 2022-08-06: qty 1

## 2022-08-06 MED ORDER — FENTANYL CITRATE (PF) 250 MCG/5ML IJ SOLN
INTRAMUSCULAR | Status: AC
  Filled 2022-08-06: qty 5

## 2022-08-06 MED ORDER — PHENYLEPHRINE 80 MCG/ML (10ML) SYRINGE FOR IV PUSH (FOR BLOOD PRESSURE SUPPORT)
PREFILLED_SYRINGE | INTRAVENOUS | Status: DC | PRN
  Administered 2022-08-06: 160 ug via INTRAVENOUS

## 2022-08-06 MED ORDER — PHENOL 1.4 % MT LIQD: 1.0000 | OROMUCOSAL | Status: DC | PRN

## 2022-08-06 MED ORDER — CHLORHEXIDINE GLUCONATE 0.12 % MT SOLN
15.0000 mL | Freq: Once | OROMUCOSAL | Status: AC
  Administered 2022-08-06: 15 mL via OROMUCOSAL
  Filled 2022-08-06: qty 15

## 2022-08-06 MED ORDER — ASPIRIN 81 MG PO TBEC
81.0000 mg | DELAYED_RELEASE_TABLET | Freq: Every day | ORAL | Status: DC
  Administered 2022-08-07: 81 mg via ORAL
  Filled 2022-08-06: qty 1

## 2022-08-06 MED ORDER — IRRISEPT - 450ML BOTTLE WITH 0.05% CHG IN STERILE WATER, USP 99.95% OPTIME
TOPICAL | Status: DC | PRN
  Administered 2022-08-06: 450 mL via TOPICAL

## 2022-08-06 SURGICAL SUPPLY — 88 items
ALCOHOL 70% 16 OZ (MISCELLANEOUS) ×1 IMPLANT
AUG COMP REV MI TAPER ADAPTER (Joint) ×1 IMPLANT
AUGMENT COMP REV MI TAPR ADPTR (Joint) IMPLANT
BAG COUNTER SPONGE SURGICOUNT (BAG) ×1 IMPLANT
BEARING HUMERAL SHLDER 36M STD (Shoulder) IMPLANT
BIT DRILL 2.7 W/STOP DISP (BIT) IMPLANT
BIT DRILL QUICK REL 1/8 2PK SL (DRILL) IMPLANT
BIT DRILL TWIST 2.7 (BIT) IMPLANT
BLADE SAW SGTL 13X75X1.27 (BLADE) ×1 IMPLANT
CHLORAPREP W/TINT 26 (MISCELLANEOUS) ×2 IMPLANT
COOLER ICEMAN CLASSIC (MISCELLANEOUS) ×1 IMPLANT
COVER SURGICAL LIGHT HANDLE (MISCELLANEOUS) ×1 IMPLANT
DRAPE INCISE IOBAN 66X45 STRL (DRAPES) ×1 IMPLANT
DRAPE U-SHAPE 47X51 STRL (DRAPES) ×2 IMPLANT
DRILL QUICK RELEASE 1/8 INCH (DRILL) ×1
DRSG AQUACEL AG ADV 3.5X10 (GAUZE/BANDAGES/DRESSINGS) ×1 IMPLANT
ELECT BLADE 4.0 EZ CLEAN MEGAD (MISCELLANEOUS) ×1
ELECT REM PT RETURN 9FT ADLT (ELECTROSURGICAL) ×1
ELECTRODE BLDE 4.0 EZ CLN MEGD (MISCELLANEOUS) IMPLANT
ELECTRODE REM PT RTRN 9FT ADLT (ELECTROSURGICAL) ×1 IMPLANT
GAUZE SPONGE 4X4 12PLY STRL LF (GAUZE/BANDAGES/DRESSINGS) ×1 IMPLANT
GLENOID SPHERE 36+6 (Joint) IMPLANT
GLOVE BIOGEL PI IND STRL 7.0 (GLOVE) ×1 IMPLANT
GLOVE BIOGEL PI IND STRL 8 (GLOVE) ×1 IMPLANT
GLOVE ECLIPSE 7.0 STRL STRAW (GLOVE) ×1 IMPLANT
GLOVE ECLIPSE 8.0 STRL XLNG CF (GLOVE) ×1 IMPLANT
GOWN STRL REUS W/ TWL LRG LVL3 (GOWN DISPOSABLE) ×2 IMPLANT
GOWN STRL REUS W/ TWL XL LVL3 (GOWN DISPOSABLE) ×1 IMPLANT
GOWN STRL REUS W/TWL LRG LVL3 (GOWN DISPOSABLE) ×2
GOWN STRL REUS W/TWL XL LVL3 (GOWN DISPOSABLE) ×1
GUIDE MODEL REV SHLD RT (ORTHOPEDIC DISPOSABLE SUPPLIES) IMPLANT
HYDROGEN PEROXIDE 16OZ (MISCELLANEOUS) ×1 IMPLANT
JET LAVAGE IRRISEPT WOUND (IRRIGATION / IRRIGATOR) ×1
KIT BASIN OR (CUSTOM PROCEDURE TRAY) ×1 IMPLANT
KIT TURNOVER KIT B (KITS) ×1 IMPLANT
LAVAGE JET IRRISEPT WOUND (IRRIGATION / IRRIGATOR) ×1 IMPLANT
LOOP VESSEL MAXI BLUE (MISCELLANEOUS) ×1 IMPLANT
MANIFOLD NEPTUNE II (INSTRUMENTS) ×1 IMPLANT
NDL HYPO 25GX1X1/2 BEV (NEEDLE) IMPLANT
NDL SUT 6 .5 CRC .975X.05 MAYO (NEEDLE) ×1 IMPLANT
NEEDLE HYPO 25GX1X1/2 BEV (NEEDLE) ×1 IMPLANT
NEEDLE MAYO TAPER (NEEDLE) ×1
NOZZLE CEMENT SMALL (ORTHOPEDIC DISPOSABLE SUPPLIES) IMPLANT
NS IRRIG 1000ML POUR BTL (IV SOLUTION) ×1 IMPLANT
PACK SHOULDER (CUSTOM PROCEDURE TRAY) ×1 IMPLANT
PAD ARMBOARD 7.5X6 YLW CONV (MISCELLANEOUS) ×2 IMPLANT
PAD COLD SHLDR WRAP-ON (PAD) ×1 IMPLANT
PIN THREADED REVERSE (PIN) IMPLANT
REAMER GUIDE BUSHING SURG DISP (MISCELLANEOUS) IMPLANT
REAMER GUIDE W/SCREW AUG (MISCELLANEOUS) IMPLANT
RESTRAINT HEAD UNIVERSAL NS (MISCELLANEOUS) ×1 IMPLANT
RETRIEVER SUT HEWSON (MISCELLANEOUS) ×1 IMPLANT
SCREW BONE LOCKING 4.75X30X3.5 (Screw) IMPLANT
SCREW BONE STRL 6.5MMX30MM (Screw) IMPLANT
SCREW LOCKING 4.75MMX15MM (Screw) IMPLANT
SCREW LOCKING NS 4.75MMX20MM (Screw) IMPLANT
SHOULDER HUMERAL BEAR 36M STD (Shoulder) ×1 IMPLANT
SLING ARM IMMOBILIZER LRG (SOFTGOODS) ×1 IMPLANT
SOL PREP POV-IOD 4OZ 10% (MISCELLANEOUS) ×1 IMPLANT
SPONGE T-LAP 18X18 ~~LOC~~+RFID (SPONGE) ×1 IMPLANT
STEM HUMERAL STRL 12MMX83MM (Stem) IMPLANT
STRIP CLOSURE SKIN 1/2X4 (GAUZE/BANDAGES/DRESSINGS) ×1 IMPLANT
SUCTION FRAZIER HANDLE 10FR (MISCELLANEOUS) ×1
SUCTION TUBE FRAZIER 10FR DISP (MISCELLANEOUS) ×1 IMPLANT
SUT BROADBAND TAPE 2PK 1.5 (SUTURE) IMPLANT
SUT FIBERWIRE #2 38 T-5 BLUE (SUTURE)
SUT FIBERWIRE 2-0 18 17.9 3/8 (SUTURE) ×2
SUT MAXBRAID (SUTURE) IMPLANT
SUT MNCRL AB 3-0 PS2 18 (SUTURE) ×1 IMPLANT
SUT SILK 2 0 TIES 10X30 (SUTURE) ×1 IMPLANT
SUT VIC AB 0 CT1 27 (SUTURE) ×2
SUT VIC AB 0 CT1 27XBRD ANBCTR (SUTURE) ×2 IMPLANT
SUT VIC AB 1 CT1 27 (SUTURE) ×3
SUT VIC AB 1 CT1 27XBRD ANBCTR (SUTURE) ×1 IMPLANT
SUT VIC AB 2-0 CT1 27 (SUTURE) ×3
SUT VIC AB 2-0 CT1 TAPERPNT 27 (SUTURE) ×2 IMPLANT
SUT VICRYL 0 UR6 27IN ABS (SUTURE) ×2 IMPLANT
SUTURE FIBERWR #2 38 T-5 BLUE (SUTURE) IMPLANT
SUTURE FIBERWR 2-0 18 17.9 3/8 (SUTURE) IMPLANT
SUTURE TAPE 1.3 40 TPR END (SUTURE) IMPLANT
SUTURETAPE 1.3 40 TPR END (SUTURE)
SYR CONTROL 10ML LL (SYRINGE) IMPLANT
TOWEL GREEN STERILE (TOWEL DISPOSABLE) ×1 IMPLANT
TOWEL GREEN STERILE FF (TOWEL DISPOSABLE) ×1 IMPLANT
TRAY FOL W/BAG SLVR 16FR STRL (SET/KITS/TRAYS/PACK) IMPLANT
TRAY FOLEY W/BAG SLVR 16FR LF (SET/KITS/TRAYS/PACK)
TRAY HUM REV SHOULDER STD +6 (Shoulder) IMPLANT
WATER STERILE IRR 1000ML POUR (IV SOLUTION) ×1 IMPLANT

## 2022-08-06 NOTE — Op Note (Signed)
NAME: Alicia [name], Alicia [name] MEDICAL RECORD NO: [id number] ACCOUNT NO: [id number] DATE OF BIRTH: 1946-06-19 FACILITY: [facility] LOCATION: [facility] PHYSICIAN: Yetta Barre. Marlou Sa, MD  Operative Report   DATE OF PROCEDURE: 08/06/2022  PREOPERATIVE DIAGNOSES:  Right shoulder rotator cuff arthropathy.  POSTOPERATIVE DIAGNOSES:  Right shoulder rotator cuff arthropathy.  PROCEDURE:  Right reverse shoulder arthroplasty using Biomet components including small augmented baseplate, 36+6 glenosphere, 1 central compression screw, 4 peripheral locking screws.  Mini humeral stem size 12 with +6 humeral tray and standard 36 mm  bearing.  SURGEON:  Yetta Barre. Marlou Sa, MD   ASSISTANT:  Annie Main.  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  DESCRIPTION OF PROCEDURE:  The patient was brought to the operating room where general anesthetic was induced.  Preoperative antibiotics administered.  Timeout was called.  The patient was placed in the beach chair position with the head in neutral  position.  Right shoulder, arm and hand prescrubbed with hydrogen peroxide, followed by Betadine and then alcohol, which was allowed to air dry and then Betadine, which was allowed to air dry.  Then, ChloraPrep solution was utilized for prepping.  Ioban  used to cover the entire operative field including the axilla.  The patient was draped in sterile manner.  Timeout was called.  Deltopectoral approach was made.  Skin and subcutaneous tissue sharply divided.  IrriSept solution used at this time as well  as at multiple times during the case.  Cephalic vein mobilized laterally.  Deltopectoral interval was entered.  The upper 2 cm of the pec was released.  Subdeltoid adhesions were released.  Subacromial adhesions were released.  Axillary nerve was   palpated and protected at all times during the case.  Next, circumflex vessels were ligated.  Subscapularis was then detached along with the capsule and soft tissue from the inferior 2 cm of the humeral neck.  Next, the humeral head was dislocated.   Kolbel retractor was removed and the [facility] retractor was placed.  The reaming was performed up to 12 mm.  Head was then cut in 30 degrees of retroversion.  Broaching was then performed up to a size 12 and a 38 mm cap was placed.  Next, a posterior  retractor was placed.  Excision of the labrum was performed circumferentially with care being taken to avoid injury to the axillary nerve.  Bankart lesion created from the 12 o'clock to 6 o'clock position.  Anterior retractor placed.  Soft tissue on the  undersurface of the coracoid was cauterized.  Subscapularis mobilization was performed primarily with release of the coracohumeral ligament.  Biceps tendon should be noted was tenodesed prior to dislocation.  That was done with combination of 2-0  FiberWire and 0 Vicryl sutures x6.  A stable tenodesis was achieved.  Next, the guide pin was placed with use of patient-specific instrumentation. Correct placement was confirmed.  Reaming for the augment was then performed.  A very good contact was  achieved.  Baseplate was placed and secured with a 30 mm central screw, followed by 4 peripheral locking screws.  A trial reduction was then performed with the +3 and +6 glenosphere and the humeral tray standard with +6 offset.  The +6 gave the best  stability with extension and abduction along with forward flexion and internal and external rotation of the arm.  Trial components were removed,  true +6 36 standard glenosphere was placed.  Next, the true stem was placed after irrigating the canal with  IrriSept solution and placing vancomycin powder.  Six MaxBraid sutures were then placed and the same stability parameters were maintained after placing the true size 12 stem.   Next, the true humeral tray and liner were placed and same excellent stability  was achieved.  The construct was "two fingers tight."  Axillary nerve again palpated and found to be intact.  Thorough irrigation was then performed with 4 liters of irrigating solution.  Subscap was then repaired with the arm in 30 degrees of external  rotation using Nice knots.  Vancomycin powder was then placed on the prosthesis after irrigating again with IrriSept solution.  Deltopectoral interval was then closed using #1 Vicryl suture followed by interrupted inverted 0 Vicryl suture, 2-0 Vicryl  suture, and 3-0 Monocryl with Steri-Strips.  Aquacel dressing applied.  A shoulder immobilizer was then placed.  The patient tolerated the procedure well without immediate complications, transferred to the recovery room in stable condition.  Luke's  assistance was required at all times during the case for opening, closing, retraction, suture management, drilling.  His assistance was a medical necessity.     SUJ D: 08/06/2022 5:01:37 pm T: 08/06/2022 10:17:00 pm  JOB: [id number]/ [id number]

## 2022-08-06 NOTE — Brief Op Note (Signed)
   08/06/2022  4:54 PM  PATIENT:  Leron Croak Robillard  76 y.o. female  PRE-OPERATIVE DIAGNOSIS:  RIGHT ROTATOR CUFF ARTHROPATHY  POST-OPERATIVE DIAGNOSIS:  RIGHT ROTATOR CUFF ARTHROPATHY  PROCEDURE:  Procedure(s): RIGHT REVERSE SHOULDER ARTHROPLASTY  SURGEON:  Surgeon(s): Marlou Sa, Tonna Corner, MD  ASSISTANT: Annie Main, PA  ANESTHESIA:   General  EBL: 100 ml    Total I/O In: -  Out: 75 [Blood:75]  BLOOD ADMINISTERED: none  DRAINS: None  LOCAL MEDICATIONS USED:  none  SPECIMEN:  No Specimen  COUNTS:  YES  TOURNIQUET:  * No tourniquets in log *  DICTATION: .Other Dictation: Dictation Number [id number]  PLAN OF CARE: Admit for overnight observation  PATIENT DISPOSITION:  PACU - hemodynamically stable

## 2022-08-06 NOTE — Transfer of Care (Signed)
Immediate Anesthesia Transfer of Care Note  Patient: Alicia [name]  Procedure(s) Performed: RIGHT REVERSE SHOULDER ARTHROPLASTY (Right: Shoulder)  Patient Location: PACU  Anesthesia Type:GA combined with regional for post-op pain  Level of Consciousness: sedated  Airway & Oxygen Therapy: Patient Spontanous Breathing and Patient connected to face mask oxygen  Post-op Assessment: Report given to RN and Post -op Vital signs reviewed and stable  Post vital signs: Reviewed and stable  Last Vitals:  Vitals Value Taken Time  BP 111/39 08/06/22 1702  Temp 36.3 C 08/06/22 1702  Pulse 80 08/06/22 1709  Resp 18 08/06/22 1709  SpO2 96 % 08/06/22 1709  Vitals shown include unvalidated device data.  Last Pain:  Vitals:   08/06/22 1702  TempSrc:   PainSc: Asleep      Patients Stated Pain Goal: 0 (70/92/95 7473)  Complications: No notable events documented.

## 2022-08-06 NOTE — Anesthesia Procedure Notes (Signed)
Procedure Name: Intubation Date/Time: 08/06/2022 1:50 PM  Performed by: Minerva Ends, CRNAPre-anesthesia Checklist: Patient identified, Emergency Drugs available, Suction available and Patient being monitored Patient Re-evaluated:Patient Re-evaluated prior to induction Oxygen Delivery Method: Circle system utilized Preoxygenation: Pre-oxygenation with 100% oxygen Induction Type: IV induction Ventilation: Mask ventilation with difficulty Laryngoscope Size: Mac and 3 Grade View: Grade I Tube type: Oral Tube size: 7.0 mm Number of attempts: 1 Airway Equipment and Method: Stylet and Oral airway Placement Confirmation: ETT inserted through vocal cords under direct vision, positive ETCO2 and breath sounds checked- equal and bilateral Secured at: 22 cm Tube secured with: Tape Dental Injury: Teeth and Oropharynx as per pre-operative assessment

## 2022-08-06 NOTE — H&P (Addendum)
Alicia [name] is an 76 y.o. female.   Chief Complaint: Right shoulder pain HPI: Alicia [name] is a 76 year old patient with right shoulder pain.  She has rotator cuff arthropathy with significant atrophy.  She did have conservative treatment consisting of injection 3 months ago which helped her only marginally.  She lives with her niece.  She is on Plavix.  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  She is ready for intervention.  She has pain which interferes with her activities of daily living as well as night pain.  She recently underwent iron transfusion which she does for chronic anemia.  Last hemoglobin 9.5 which is increased over 8.5 from a week ago and trending in the appropriate direction.  She denies any symptoms from her anemia such as lightheadedness or shortness of breath or dizziness.  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   R mid forearm, [facility]    Past Surgical History:  Procedure Laterality Date   APPENDECTOMY     CHOLECYSTECTOMY  10/2016   EYE SURGERY Bilateral 2017   cataracts removed   LUMBAR LAMINECTOMY/DECOMPRESSION MICRODISCECTOMY Left 07/21/2021   Procedure: Laminectomy and Foraminotomy - L4-L5 - left;  Surgeon: Kary Kos, MD;  Location: [facility];  Service: Neurosurgery;  Laterality: Left;  3C   PAROTID GLAND TUMOR EXCISION Left 2003   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   Procedure: PERIPHERAL VASCULAR INTERVENTION;  Surgeon: Waynetta Sandy, MD;  Location: [facility];  Service: Cardiovascular;;  celiac    VISCERAL ANGIOGRAPHY N/A 05/18/2022   Procedure: VISCERAL ANGIOGRAPHY;  Surgeon: Waynetta Sandy, MD;  Location: [facility];  Service: Cardiovascular;  Laterality: N/A;    Family History  Problem Relation Age of Onset   Heart disease Mother    Cerebral aneurysm Father    Social History:  reports that she has been smoking cigarettes. She has a 46.50 pack-year smoking history. She has been exposed to tobacco smoke. She has never used smokeless tobacco. She reports that she does not currently use alcohol. She reports that she does not use drugs.  Allergies:  Allergies  Allergen Reactions   Penicillins Other (See Comments)    Yeast infection     Medications Prior to Admission  Medication Sig Dispense Refill   acetaminophen (TYLENOL) 500 MG tablet Take 1,000 mg by mouth every 6 (six) hours as needed for moderate pain.     albuterol (VENTOLIN HFA) 108 (90 Base) MCG/ACT inhaler Inhale 1-2 puffs into the lungs 4 (four) times daily as needed for shortness of breath or wheezing. 1 each 0   aspirin EC 81 MG tablet Take 81 mg by mouth every other day.     Budeson-Glycopyrrol-Formoterol (BREZTRI AEROSPHERE) 160-9-4.8 MCG/ACT AERO Inhale 2 puffs into the lungs 2 (two) times daily. (Patient taking differently: Inhale 2 puffs into the lungs once.) 10.7 g 11   Cholecalciferol (VITAMIN D3) 125 MCG (5000 UT) CAPS Take 5,000 Units by mouth in the  morning.     clopidogrel (PLAVIX) 75 MG tablet Take 1 tablet (75 mg total) by mouth daily. 30 tablet 11   [employer] TRIPLE MAGNESIUM COMPLEX PO Take 1 capsule by mouth at bedtime.     faricimab-svoa (VABYSMO) 6 MG/0.05ML SOLN intravitreal injection 6 mg by Intravitreal route every 8 (eight) weeks.     gabapentin (NEURONTIN) 300 MG capsule TAKE 1 CAPSULE BY MOUTH THREE TIMES A DAY 270 capsule 3   hydrocortisone 2.5 % cream Apply 1 Application topically daily as needed (rash/irritation). Apply to itchy rash on face qd up to 4 days a week prn flares      Lutein 20 MG TABS Take 20 mg by mouth in the morning.     omeprazole (PRILOSEC) 40 MG capsule Take 1 capsule (40 mg total) by mouth daily. 30 capsule 1   pramipexole (MIRAPEX) 0.5 MG tablet TAKE 1 TABLET BY MOUTH TWICE A DAY 180 tablet 1   rosuvastatin (CRESTOR) 10 MG tablet Take 1 tablet (10 mg total) by mouth daily. 30 tablet 11   venlafaxine XR (EFFEXOR-XR) 37.5 MG 24 hr capsule Take 1 capsule (37.5 mg total) by mouth at bedtime. 90 capsule 1    Results for orders placed or performed during the hospital encounter of 08/06/22 (from the past 48 hour(s))  Sample to Blood Bank     Status: None   Collection Time: 08/06/22 10:30 AM  Result Value Ref Range   Blood Bank Specimen SAMPLE AVAILABLE FOR TESTING    Sample Expiration      08/07/2022,2359 Performed at [facility], [place] [address] [place], [place] [address]    No results found.  Review of Systems  Musculoskeletal:  Positive for arthralgias.  All other systems reviewed and are negative.   Blood pressure (!) 142/65, pulse 82, temperature 97.8 F (36.6 C), temperature source Oral, resp. rate 18, height 5' (1.524 m), weight 89.4 kg, SpO2 96 %. Physical Exam Vitals reviewed.  HENT:     Head: Normocephalic.     Nose: Nose normal.     Mouth/Throat:     Mouth: Mucous membranes are moist.  Eyes:     Pupils: Pupils are equal, round, and reactive to light.  Cardiovascular:     Rate and Rhythm: Normal rate.     Pulses: Normal pulses.  Pulmonary:     Effort: Pulmonary effort is normal.  Abdominal:     General: Abdomen is flat.  Musculoskeletal:     Cervical back: Normal range of motion.  Skin:    General: Skin is warm.     Capillary Refill: Capillary refill takes less than 2 seconds.  Neurological:     General: No focal deficit present.     Mental Status: She is alert.  Psychiatric:        Mood and Affect: Mood normal.    Ortho exam demonstrates weakness to infraspinatus supraspinatus testing on the right.   Axillary nerve is functional.  Subscap strength is 5 out of 5 on the right.  Passive range of motion is approximately 45/90/140.  Actively she is just around 90 degrees of forward flexion.  Radial pulse is intact.  No AC joint tenderness.  No other masses lymphadenopathy or skin changes noted in that shoulder girdle region  Assessment/Plan  : Impression is rotator cuff arthropathy right shoulder.  A long discussion with Dorene about operative and nonoperative treatment options.  In general she cannot live with the pain that she has.  She would  like to get her arterial stenosis in the mid abdominal region addressed prior to shoulder replacement; however, she does want to proceed with shoulder replacement sometime in the early fall.  The stent that she had earlier by Dr. Gwenlyn Saran has worked well.  The risk and benefits of surgical intervention are discussed with [facility] including not limited to infection or vessel damage instability incomplete pain relief as well as incomplete restoration of function.  Patient understands risk benefits and wishes to proceed BMI: Body mass index is 38.47 kg/m.  Lab Results  Component Value Date   ALBUMIN 4.4 01/13/2022   Diabetes: Patient does not have a diagnosis of diabetes.     Smoking Status: Social History   Tobacco Use  Smoking Status Every Day   Packs/day: 1.50   Years: 31.00   Total pack years: 46.50   Types: Cigarettes   Passive exposure: Current  Smokeless Tobacco Never  Tobacco Comments   Smokes a pack and a half a day [facility] 04/02/22   Ready to quit: Not Answered Counseling given: Not Answered Tobacco comments: Smokes a pack and a half a day [facility] 04/02/22  In a cessation program and she was counseled about diminishing her smoking.  In terms of improving her chances for healing       Anderson Malta, MD 08/06/2022, 11:48 AM

## 2022-08-06 NOTE — Progress Notes (Signed)
Pt. Unable to move right hand will hold cpap till pt. Improves pt. Is on oxygen

## 2022-08-06 NOTE — Anesthesia Procedure Notes (Addendum)
Anesthesia Regional Block: Interscalene brachial plexus block   Pre-Anesthetic Checklist: , timeout performed,  Correct Patient, Correct Site, Correct Laterality,  Correct Procedure, Correct Position, site marked,  Risks and benefits discussed,  Surgical consent,  Pre-op evaluation,  At surgeon's request and post-op pain management  Laterality: Right and Upper  Prep: chloraprep       Needles:  Injection technique: Single-shot  Needle Type: Echogenic Needle     Needle Length: 9cm  Needle Gauge: 21     Additional Needles:   Procedures:,,,, ultrasound used (permanent image in chart),,    Narrative:  Start time: 08/06/2022 11:55 AM End time: 08/06/2022 12:01 PM Injection made incrementally with aspirations every 5 mL.  Performed by: Personally  Anesthesiologist: Annye Asa, MD  Additional Notes: Pt identified in Holding room.  Monitors applied. Working IV access confirmed. Sterile prep R clavicle and neck.  #21ga ECHOgenic Arrow block needle to interscalene brachial plexus with US guidance.  10cc 0.5% Bupivacaine 1:200k epi, 10cc Exparel injected incrementally after negative test dose.  Patient asymptomatic, VSS, no heme aspirated, tolerated well.   Jenita Seashore, MD

## 2022-08-07 ENCOUNTER — Encounter (HOSPITAL_COMMUNITY): Payer: Self-pay | Admitting: Orthopedic Surgery

## 2022-08-07 ENCOUNTER — Ambulatory Visit: Payer: PPO

## 2022-08-07 DIAGNOSIS — M12811 Other specific arthropathies, not elsewhere classified, right shoulder: Secondary | ICD-10-CM | POA: Diagnosis not present

## 2022-08-07 LAB — CBC
HCT: 30.3 % — ABNORMAL LOW (ref 36.0–46.0)
Hemoglobin: 9.1 g/dL — ABNORMAL LOW (ref 12.0–15.0)
MCH: 26.5 pg (ref 26.0–34.0)
MCHC: 30 g/dL (ref 30.0–36.0)
MCV: 88.1 fL (ref 80.0–100.0)
Platelets: 227 10*3/uL (ref 150–400)
RBC: 3.44 MIL/uL — ABNORMAL LOW (ref 3.87–5.11)
RDW: 22.8 % — ABNORMAL HIGH (ref 11.5–15.5)
WBC: 10.3 10*3/uL (ref 4.0–10.5)
nRBC: 0 % (ref 0.0–0.2)

## 2022-08-07 MED ORDER — METHOCARBAMOL 500 MG PO TABS: 500.0000 mg | ORAL_TABLET | Freq: Three times a day (TID) | ORAL | 1 refills | Status: DC | PRN

## 2022-08-07 MED ORDER — ASPIRIN EC 81 MG PO TBEC: 81.0000 mg | DELAYED_RELEASE_TABLET | Freq: Every day | ORAL | 0 refills | Status: DC

## 2022-08-07 MED ORDER — CELECOXIB 100 MG PO CAPS: 100.0000 mg | ORAL_CAPSULE | Freq: Two times a day (BID) | ORAL | 0 refills | Status: DC

## 2022-08-07 NOTE — Plan of Care (Signed)

## 2022-08-07 NOTE — Progress Notes (Signed)
  Subjective: Alicia [name] is a 76 y.o. female s/p right RSA.  They are POD 1.  Pt's pain is controlled.  Patient denies any complaints of chest pain, shortness of breath, abdominal pain.  Block is still in effect.  She has been ambulatory with no dizziness or lightheadedness.  She is ready for discharge home.  Objective: Vital signs in last 24 hours: Temp:  [97.4 F (36.3 C)-97.8 F (36.6 C)] 97.7 F (36.5 C) (09/29 0825) Pulse Rate:  [61-78] 68 (09/29 0825) Resp:  [12-18] 13 (09/29 0825) BP: (98-154)/(39-91) 109/50 (09/29 0825) SpO2:  [92 %-98 %] 98 % (09/29 0825)  Intake/Output from previous day: 09/28 0701 - 09/29 0700 In: 600 [I.V.:600] Out: 75 [Blood:75] Intake/Output this shift: No intake/output data recorded.  Exam:  No gross blood or drainage overlying the dressing 2+ radial pulse of the operative extremity Postoperative physical exam somewhat limited by interscalene block but intact EPL, grip strength, finger abduction, bicep flexion.  Axillary nerve intact with deltoid firing.   Labs: Recent Labs    08/07/22 0241  HGB 9.1*   Recent Labs    08/07/22 0241  WBC 10.3  RBC 3.44*  HCT 30.3*  PLT 227   No results for input(s): "NA", "K", "CL", "CO2", "BUN", "CREATININE", "GLUCOSE", "CALCIUM" in the last 72 hours. No results for input(s): "LABPT", "INR" in the last 72 hours.  Assessment/Plan: Pt is POD 1 s/p right RSA    -Plan to discharge to home today  -No lifting with the operative arm  -Stay in sling except for showering/sleeping and using CPM machine at home.  No lifting with the operative arm more than 1 to 2 pounds  -Postop hemoglobin at 9.1 which is down from 9.5.  However it is still higher than her hemoglobin before surgery last week which was 8.5.  She is asymptomatic as far as no dizziness or lightheadedness with ambulation.  Doing well and not think this is a problem for her but did give her recommendations that she should call our office  on-call number if she has any increased symptoms related to anemia.  -Follow-up with Dr. Marlou Sa in clinic 2 weeks postoperatively     [facility] 08/07/2022, 12:19 PM

## 2022-08-07 NOTE — Progress Notes (Signed)
Mobility Specialist Progress Note   08/07/22 1400  Mobility  Activity Ambulated independently in hallway  Level of Assistance Modified independent, requires aide device or extra time  Assistive Device Cane  RUE Weight Bearing NWB  Distance Ambulated (ft) 180 ft  Activity Response Tolerated well  $Mobility charge 1 Mobility   Received pt in chair having no complaints and agreeable to mobility. Pt was asymptomatic throughout ambulation and returned to room w/o fault. Left in chair w/ call bell in reach and all needs met.  Holland Falling Mobility Specialist MS [facility] #:  [phone] Acute Rehab Office:  [phone]

## 2022-08-07 NOTE — Evaluation (Signed)
Occupational Therapy Evaluation Patient Details Name: Alicia [name] MRN: [id number] DOB: [birth year] Today's Date: 08/07/2022   History of Present Illness 76 yo female s/p R shoulder arthropathy on 9/28. PMH including arthritis, COPD, depression, macular degeneration, and lumbar decompression (2022)   Clinical Impression   PTA, pt was independent with ADLs and using a cane for mobility; planning for niece to stay for increased support. Currently, pt requires Min A for UB ADLs, Mod A for sling management, Min-Max A for LB ADLs and Supervision for functional mobility using cane.  Provided education and handout on shoulder precautions (no AROM of shoulder), RUE exercises, UB ADLs, sleep positioning, sling management, LB ADLs, toileting, and tub transfer; pt demonstrated understanding. Answered all pt questions in preparation for possible dc later today. Recommend dc home once medically stable per physician and follow up at OP OT once cleared by MD.     Recommendations for follow up therapy are one component of a multi-disciplinary discharge planning process, led by the attending physician.  Recommendations may be updated based on patient status, additional functional criteria and insurance authorization.   Follow Up Recommendations  Follow physician's recommendations for discharge plan and follow up therapies    Assistance Recommended at Discharge Frequent or constant Supervision/Assistance  Patient can return home with the following      Functional Status Assessment  Patient has had a recent decline in their functional status and demonstrates the ability to make significant improvements in function in a reasonable and predictable amount of time.  Equipment Recommendations  None recommended by OT    Recommendations for Other Services       Precautions / Restrictions Precautions Precautions: Shoulder Type of Shoulder Precautions: No AROM of shoulder. Cleared for 0-30 external  rotation and pendulums. Shoulder Interventions: Shoulder sling/immobilizer;Off for dressing/bathing/exercises Precaution Booklet Issued: Yes (comment) Precaution Comments: Reviewed all precautions, exercises, and compensatory technique Required Braces or Orthoses: Sling Restrictions Weight Bearing Restrictions: Yes RUE Weight Bearing: Non weight bearing      Mobility Bed Mobility               General bed mobility comments: Educating on positioning in sleep with pillows    Transfers Overall transfer level: Needs assistance Equipment used: Straight cane Transfers: Sit to/from Stand Sit to Stand: Min guard                  Balance Overall balance assessment: No apparent balance deficits (not formally assessed)                                         ADL either performed or assessed with clinical judgement   ADL Overall ADL's : Needs assistance/impaired Eating/Feeding: Set up;Sitting   Grooming: Set up;Sitting   Upper Body Bathing: Minimal assistance;Sitting Upper Body Bathing Details (indicate cue type and reason): Educating on dangle of RUE to wash R axillary area. Unable to reach L axillary area but pt rpeorting her neice can assist as needed Lower Body Bathing: Minimal assistance;Sit to/from stand   Upper Body Dressing : Minimal assistance;Sitting;Moderate assistance Upper Body Dressing Details (indicate cue type and reason): Min A for doninng button up shirt. Mod A for positioning of sling. Discussed techniques to make this easier. Lower Body Dressing: Minimal assistance;Sit to/from stand;Maximal assistance Lower Body Dressing Details (indicate cue type and reason): Min A for donning underwear and pants. difficulty coordinating with neuropathy  at feet.Max A for shoe and sock which pt reports famiyl can assist Toilet Transfer: Min guard;Ambulation [facility])     Toileting - Clothing Manipulation Details (indicate cue type and reason): Pt reports  she uses a bidet at home Tub/ Shower Transfer: Min guard;Tub transfer;Ambulation Tub/Shower Transfer Details (indicate cue type and reason): Min guard A for stepping over tub. Pt planning on sponge bathing at dc - however, discussed purchasing a shower seat for future Functional mobility during ADLs: Supervision/safety;Cane General ADL Comments: Providing education and handout on exercises, restrictions, UB ADLs, LB ADLs, and functional transfers.     Vision Baseline Vision/History: 1 Wears glasses (reading)       Perception     Praxis      Pertinent Vitals/Pain Pain Assessment Pain Assessment: No/denies pain     Hand Dominance Right   Extremity/Trunk Assessment Upper Extremity Assessment Upper Extremity Assessment: RUE deficits/detail RUE Deficits / Details: s/p shoulder sx. No AROM. Cleared for pendulums and external rotations 0-30. Nerve block still in place. No active movement of elbow. Difficulty performing supination. Able to complete AROM of wrist and hand.   Lower Extremity Assessment Lower Extremity Assessment: Overall WFL for tasks assessed   Cervical / Trunk Assessment Cervical / Trunk Assessment: Kyphotic   Communication Communication Communication: No difficulties   Cognition Arousal/Alertness: Awake/alert Behavior During Therapy: WFL for tasks assessed/performed Overall Cognitive Status: Within Functional Limits for tasks assessed                                       General Comments  HR 80-115    Exercises Exercises: Shoulder Shoulder Exercises Pendulum Exercise: PROM, Right, 5 reps, Seated (Dangle and hold for 10 sec) Elbow Flexion: AAROM, Right, 10 reps, Seated Elbow Extension: AAROM, Right, 10 reps, Seated Wrist Flexion: AROM, Right, 10 reps, Seated Wrist Extension: AROM, Right, 10 reps, Seated Digit Composite Flexion: AROM, Right, 10 reps, Seated Composite Extension: AROM, Right, 10 reps, Seated   Shoulder Instructions  Shoulder Instructions Donning/doffing shirt without moving shoulder: Minimal assistance Method for sponge bathing under operated UE: Min-guard Donning/doffing sling/immobilizer: Moderate assistance Correct positioning of sling/immobilizer: Moderate assistance Pendulum exercises (written home exercise program): Supervision/safety ROM for elbow, wrist and digits of operated UE: Minimal assistance;Supervision/safety Sling wearing schedule (on at all times/off for ADL's): Independent Proper positioning of operated UE when showering: Supervision/safety    Home Living Family/patient expects to be discharged to:: Private residence Living Arrangements: Other relatives (Neice) Available Help at Discharge: Family;Friend(s);Available 24 hours/day Type of Home: House Home Access: Stairs to enter [employer] of Steps: 1 Entrance Stairs-Rails: None Home Layout: One level     Bathroom Shower/Tub: Teacher, early years/pre: Standard     Home Equipment: Cane - single point          Prior Functioning/Environment Prior Level of Function : Independent/Modified Independent             Mobility Comments: use of cane for mobility ADLs Comments: ADLs and IADLs        OT Problem List: Decreased strength;Decreased range of motion;Decreased activity tolerance;Decreased knowledge of use of DME or AE;Decreased knowledge of precautions;Impaired UE functional use      OT Treatment/Interventions: Self-care/ADL training;Therapeutic exercise;Energy conservation;DME and/or AE instruction;Therapeutic activities;Patient/family education    OT Goals(Current goals can be found in the care plan section) Acute Rehab OT Goals Patient Stated Goal: Go home OT  Goal Formulation: With patient Time For Goal Achievement: 08/21/22 Potential to Achieve Goals: Good  OT Frequency: Min 3X/week    Co-evaluation              AM-PAC OT "6 Clicks" Daily Activity     Outcome Measure Help  from another person eating meals?: None Help from another person taking care of personal grooming?: A Little Help from another person toileting, which includes using toliet, bedpan, or urinal?: A Little Help from another person bathing (including washing, rinsing, drying)?: A Little Help from another person to put on and taking off regular upper body clothing?: A Lot Help from another person to put on and taking off regular lower body clothing?: A Lot 6 Click Score: 17   End of Session Equipment Utilized During Treatment: Other (comment) [facility]) Nurse Communication: Mobility status  Activity Tolerance: Patient tolerated treatment well Patient left: in chair;with call bell/phone within reach  OT Visit Diagnosis: Muscle weakness (generalized) (M62.81);Pain                Time: 8841-6606 OT Time Calculation (min): 49 min Charges:  OT General Charges $OT Visit: 1 Visit OT Evaluation $OT Eval Low Complexity: 1 Low OT Treatments $Self Care/Home Management : 23-37 mins  Delphin Funes MSOT, OTR/L Acute Rehab Office: [place] 08/07/2022, 12:36 PM

## 2022-08-07 NOTE — Progress Notes (Signed)
PT Cancellation Note  Patient Details Name: MALI EPPARD MRN: [id number] DOB: 28-Apr-1946   Cancelled Treatment:    Reason Eval/Treat Not Completed: PT screened, no needs identified, will sign off Per OT, patient utilizes cane at baseline and ambulatory with no LOB s/p R reverse TSA. All education completed by OT. No skilled PT needs identified. PT will sign off.   Elick Aguilera A. Gilford Rile PT, DPT Acute Rehabilitation Services Office [phone]    Linna Hoff 08/07/2022, 10:13 AM

## 2022-08-10 NOTE — Telephone Encounter (Signed)
Patient called in and has questions about nerve block not working tongue still numb talking with lisp, and has questions about PT please advise.

## 2022-08-11 NOTE — Anesthesia Postprocedure Evaluation (Signed)
Anesthesia Post Note  Patient: Alicia [name]  Procedure(s) Performed: RIGHT REVERSE SHOULDER ARTHROPLASTY (Right: Shoulder)     Patient location during evaluation: PACU Anesthesia Type: General and Regional Level of consciousness: awake and alert, patient cooperative and oriented Pain management: pain level controlled Vital Signs Assessment: post-procedure vital signs reviewed and stable Respiratory status: nonlabored ventilation, spontaneous breathing, respiratory function stable and patient connected to nasal cannula oxygen Cardiovascular status: blood pressure returned to baseline and stable Postop Assessment: no apparent nausea or vomiting Anesthetic complications: no Comments: Unable to reach patient by telephone post op, but when eval in PACU immed post op, pt comfortable with block working, VSS, no nausea   No notable events documented.  Last Vitals:  Vitals:   08/07/22 0612 08/07/22 0825  BP: (!) 115/50 (!) 109/50  Pulse: 75 68  Resp: 18 13  Temp: 36.5 C 36.5 C  SpO2: 97% 98%    Last Pain:  Vitals:   08/07/22 0612  TempSrc: Axillary  PainSc: 4                  Lynzy Rawles,E. Marlies Ligman

## 2022-08-11 NOTE — Telephone Encounter (Signed)
I called uncommon side effect but should resolve within 2 weeks according to anesthesia.

## 2022-08-11 NOTE — Telephone Encounter (Signed)
noted 

## 2022-08-12 NOTE — Discharge Summary (Signed)
Physician Discharge Summary      Patient ID: Alicia [name] MRN: [id number] DOB/AGE: [birth year] 76 y.o.  Admit date: 08/06/2022 Discharge date: 08/07/2022  Admission Diagnoses:  Principal Problem:   S/P reverse total shoulder arthroplasty, right   Discharge Diagnoses:  Same  Surgeries: Procedure(s): RIGHT REVERSE SHOULDER ARTHROPLASTY on 08/06/2022   Consultants:   Discharged Condition: Stable  Hospital Course: Alicia [name] is an 76 y.o. 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  They were brought to the operating room on 08/06/2022 and underwent the above named procedures.  Pt awoke from anesthesia without complication and was transferred to the floor. On POD1, patient's pain was controlled.  No red flag symptoms.  Block was still in effect.  Hemoglobin did drop down slightly to 9.1 but it was still higher than her preoperative hemoglobin of 8.5 about 1 to 2 weeks prior to surgery.  She had no symptoms related to anemia.  She was discharged home on POD 1..  Pt will f/u with Dr. Marlou Sa in clinic in ~2 weeks.   Antibiotics given:  Anti-infectives (From admission, onward)    Start     Dose/Rate Route Frequency Ordered Stop   08/06/22 2000  ceFAZolin (ANCEF) IVPB 2g/100 mL premix  Status:  Discontinued        2 g 200 mL/hr over 30 Minutes Intravenous Every 8 hours 08/06/22 1717 08/07/22 2003   08/06/22 1548  vancomycin (VANCOCIN) powder  Status:  Discontinued          As needed 08/06/22 1549 08/06/22 1659   08/06/22 1015  ceFAZolin (ANCEF) IVPB 2g/100 mL premix        2 g 200 mL/hr over 30 Minutes Intravenous On call to O.R. 08/06/22 1004 08/06/22 1422     .  Recent vital signs:  Vitals:   08/07/22 0612 08/07/22 0825  BP: (!) 115/50 (!) 109/50  Pulse: 75 68  Resp: 18 13  Temp: 97.7 F (36.5 C) 97.7 F (36.5 C)  SpO2: 97% 98%    Recent laboratory studies:   Results for orders placed or performed during the hospital encounter of 08/06/22  CBC  Result Value Ref Range   WBC 10.3 4.0 - 10.5 K/uL   RBC 3.44 (L) 3.87 - 5.11 MIL/uL   Hemoglobin 9.1 (L) 12.0 - 15.0 g/dL   HCT 30.3 (L) 36.0 - 46.0 %   MCV 88.1 80.0 - 100.0 fL   MCH 26.5 26.0 - 34.0 pg   MCHC 30.0 30.0 - 36.0 g/dL   RDW 22.8 (H) 11.5 - 15.5 %   Platelets 227 150 - 400 K/uL   nRBC 0.0 0.0 - 0.2 %  Sample to Blood Bank  Result Value Ref Range   Blood Bank Specimen SAMPLE AVAILABLE FOR TESTING    Sample Expiration      08/07/2022,2359 Performed at [facility] [address] [place], [place] [address]     Discharge Medications:   Allergies as of 08/07/2022       Reactions   Penicillins Other (See Comments)   Yeast infection        Medication List     STOP taking these medications    clopidogrel 75 MG tablet Commonly known as: Plavix       TAKE these medications    acetaminophen 500 MG tablet Commonly known as: TYLENOL Take 1,000 mg by mouth every 6 (  six) hours as needed for moderate pain.   albuterol 108 (90 Base) MCG/ACT inhaler Commonly known as: VENTOLIN HFA Inhale 1-2 puffs into the lungs 4 (four) times daily as needed for shortness of breath or wheezing.   aspirin EC 81 MG tablet Take 1 tablet (81 mg total) by mouth daily. What changed: when to take this   Breztri Aerosphere 160-9-4.8 MCG/ACT Aero Generic drug: Budeson-Glycopyrrol-Formoterol Inhale 2 puffs into the lungs 2 (two) times daily. What changed: when to take this   celecoxib 100 MG capsule Commonly known as: CeleBREX Take 1 capsule (100 mg total) by mouth 2 (two) times daily.   [employer] TRIPLE MAGNESIUM COMPLEX PO Take 1 capsule by mouth at bedtime.   gabapentin 300 MG capsule Commonly known as: NEURONTIN TAKE 1 CAPSULE BY MOUTH THREE TIMES A DAY   hydrocortisone 2.5 % cream Apply 1 Application topically daily as needed (rash/irritation). Apply to itchy rash on face qd up  to 4 days a week prn flares   Lutein 20 MG Tabs Take 20 mg by mouth in the morning.   methocarbamol 500 MG tablet Commonly known as: ROBAXIN Take 1 tablet (500 mg total) by mouth every 8 (eight) hours as needed for muscle spasms.   omeprazole 40 MG capsule Commonly known as: PRILOSEC Take 1 capsule (40 mg total) by mouth daily.   pramipexole 0.5 MG tablet Commonly known as: MIRAPEX TAKE 1 TABLET BY MOUTH TWICE A DAY   rosuvastatin 10 MG tablet Commonly known as: Crestor Take 1 tablet (10 mg total) by mouth daily.   Vabysmo 6 MG/0.05ML Soln intravitreal injection Generic drug: faricimab-svoa 6 mg by Intravitreal route every 8 (eight) weeks.   venlafaxine XR 37.5 MG 24 hr capsule Commonly known as: EFFEXOR-XR Take 1 capsule (37.5 mg total) by mouth at bedtime.   Vitamin D3 125 MCG (5000 UT) Caps Take 5,000 Units by mouth in the morning.        Diagnostic Studies: DG Shoulder Right Port  Result Date: 08/06/2022 CLINICAL DATA:  Postop EXAM: RIGHT SHOULDER - 1 VIEW COMPARISON:  MRI 06/02/2022 FINDINGS: Right shoulder replacement with intact hardware and normal alignment. Gas in the soft tissues consistent with recent surgery. IMPRESSION: Right shoulder replacement with expected postsurgical change Electronically Signed   By: Donavan Foil M.D.   On: 08/06/2022 20:22    Disposition: Discharge disposition: 01-Home or Self Care       Discharge Instructions     Call MD / Call 911   Complete by: As directed    If you experience chest pain or shortness of breath, CALL 911 and be transported to the hospital emergency room.  If you develope a fever above 101 F, pus (white drainage) or increased drainage or redness at the wound, or calf pain, call your surgeon's office.   Constipation Prevention   Complete by: As directed    Drink plenty of fluids.  Prune juice may be helpful.  You may use a stool softener, such as Colace (over the counter) 100 mg twice a day.  Use MiraLax  (over the counter) for constipation as needed.   Diet - low sodium heart healthy   Complete by: As directed    Discharge instructions   Complete by: As directed    You may shower, dressing is waterproof.  Do not bathe or soak the operative shoulder in a tub, pool.  Use the CPM machine 3 times a day for one hour each time, increasing the degrees of range  of motion with each session.  No lifting with the operative shoulder. Continue use of the sling.  Follow-up with Dr. Marlou Sa in ~2 weeks on your given appointment date.  We will remove your adhesive bandage at that time.  Call the office on-call number with any concerns  Dental Antibiotics:  In most cases prophylactic antibiotics for Dental procdeures after total joint surgery are not necessary.  Exceptions are as follows:  1. History of prior total joint infection  2. Severely immunocompromised (Organ Transplant, cancer chemotherapy, Rheumatoid biologic meds such as [place])  3. Poorly controlled diabetes (A1C &gt; 8.0, blood glucose over 200)  If you have one of these conditions, contact your surgeon for an antibiotic prescription, prior to your dental procedure.   Increase activity slowly as tolerated   Complete by: As directed    Post-operative opioid taper instructions:   Complete by: As directed    POST-OPERATIVE OPIOID TAPER INSTRUCTIONS: It is important to wean off of your opioid medication as soon as possible. If you do not need pain medication after your surgery it is ok to stop day one. Opioids include: Codeine, Hydrocodone(Norco, Vicodin), Oxycodone(Percocet, oxycontin) and hydromorphone amongst others.  Long term and even short term use of opiods can cause: Increased pain response Dependence Constipation Depression Respiratory depression And more.  Withdrawal symptoms can include Flu like symptoms Nausea, vomiting And more Techniques to manage these symptoms Hydrate well Eat regular healthy meals Stay active Use  relaxation techniques(deep breathing, meditating, yoga) Do Not substitute Alcohol to help with tapering If you have been on opioids for less than two weeks and do not have pain than it is ok to stop all together.  Plan to wean off of opioids This plan should start within one week post op of your joint replacement. Maintain the same interval or time between taking each dose and first decrease the dose.  Cut the total daily intake of opioids by one tablet each day Next start to increase the time between doses. The last dose that should be eliminated is the evening dose.           Follow-up Information     [name], FNP Follow up.   Specialty: Family Medicine Contact information: [place] [address] [phone]         Meredith Pel, MD. Schedule an appointment as soon as possible for a visit.   Specialty: Orthopedic Surgery Why: Follow-up with Dr. Marlou Sa in clinic 2 weeks postoperatively Contact information: [place] [address] [phone]                  Signed: Annie Main 08/12/2022, 7:51 PM

## 2022-08-21 ENCOUNTER — Ambulatory Visit (INDEPENDENT_AMBULATORY_CARE_PROVIDER_SITE_OTHER): Payer: PPO | Admitting: Orthopedic Surgery

## 2022-08-21 ENCOUNTER — Ambulatory Visit (INDEPENDENT_AMBULATORY_CARE_PROVIDER_SITE_OTHER): Payer: PPO

## 2022-08-21 DIAGNOSIS — Z96611 Presence of right artificial shoulder joint: Secondary | ICD-10-CM | POA: Diagnosis not present

## 2022-08-21 NOTE — Telephone Encounter (Signed)
Patient needs a one week follow up per Marlou Sa please advise

## 2022-08-21 NOTE — Telephone Encounter (Signed)
Patient was to return in 1 week a spot needs to be made, please advise

## 2022-08-23 ENCOUNTER — Encounter: Payer: Self-pay | Admitting: Orthopedic Surgery

## 2022-08-23 NOTE — Progress Notes (Signed)
Post-Op Visit Note   Patient: Alicia [name]           Date of Birth: 30-Jun-1946           MRN: [id number] Visit Date: 08/21/2022 PCP: [name], FNP   Assessment & Plan:  Chief Complaint:  Chief Complaint  Patient presents with   Right Shoulder - Routine Post Op    08/06/22 right rsa   Visit Diagnoses:  1. History of arthroplasty of right shoulder     Plan: Alicia [name] is a 76 y.o. female who presents s/p right reverse shoulder arthroplasty on 08/06/2022.  Patient is doing well and pain is overall controlled.  Denies any chest pain, SOB, fevers, chills.  No complaint of any instability symptoms.  Not taking any opioid medication, just taking Tylenol as needed.  Does not really have any pain.  Did report numbness on the right side of her tongue and a lisp with talking since the nerve block but this is steadily improving and almost resolved at this point.  On exam, patient has range of motion 30 degrees external rotation, 90 degrees abduction, 140 degrees forward flexion.  Intact EPL, FPL, finger abduction, finger adduction, pronation/supination, bicep, tricep, deltoid of operative extremity.  Axillary nerve intact with deltoid firing.  Incision is healing well without evidence of infection or dehiscence side from a very small area in the most distal portion of the incision where there is a little bit of serosanguineous drainage that is expressible but no surrounding erythema or purulence noted.  2+ radial pulse of the operative extremity.  Subscapularis with good strength rated 5/5.  Plan is discontinue sling.  Okay to very lightly lift with the operative extremity but no lifting anything heavier than a coffee cup or cell phone.  Start home health physical therapy to focus on passive range of motion and active range of motion with deltoid isometrics.  Do not want to externally rotate past 30 degrees to protect subscapularis repair.  Follow-up in 4 weeks for clinical  recheck.  Patient is welcome to reach out to the office via MyChart or by calling office phone number in the meantime between now and their next appointment..   Follow-up in 1 week for clinical recheck just on the incision  Follow-Up Instructions: No follow-ups on file.   Orders:  Orders Placed This Encounter  Procedures   XR Shoulder Right   No orders of the defined types were placed in this encounter.   Imaging: No results found.  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/2022   Centrilobular emphysema ([facility]) 02/13/2022   Cigarette smoker 02/06/2022   Asthmatic bronchitis , chronic 02/05/2022   Incidental pulmonary nodule, greater than or equal to 49m 01/21/2022   Bilateral carotid artery stenosis 12/26/2021   Iron deficiency anemia 12/26/2021   Mixed hyperlipidemia 12/26/2021   Chronic cough 12/26/2021   Adjustment reaction with anxiety and depression 12/26/2021   Chronic venous insufficiency 02/29/2020   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/05/2019   Arthritis of shoulder 06/08/2018   GERD (gastroesophageal reflux disease) 06/08/2018   Macular degeneration 06/08/2018   Sleep apnea 06/08/2018   Past Medical History:  Diagnosis Date   Anemia    iron deficiency   Arthritis  Asthma    Carotid artery occlusion    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[facility])    has celiac artery stent   COPD with acute exacerbation ([facility]) 12/26/2021   Depression    GERD (gastroesophageal reflux disease)    History of hiatal hernia    Macular degeneration    Positive fecal occult blood test 01/19/2022   Sleep apnea    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   R mid forearm, [facility]    Family History  Problem Relation Age of Onset   Heart  disease Mother    Cerebral aneurysm Father     Past Surgical History:  Procedure Laterality Date   APPENDECTOMY     CHOLECYSTECTOMY  10/2016   EYE SURGERY Bilateral 2017   cataracts removed   LUMBAR LAMINECTOMY/DECOMPRESSION MICRODISCECTOMY Left 07/21/2021   Procedure: Laminectomy and Foraminotomy - L4-L5 - left;  Surgeon: Kary Kos, MD;  Location: [facility];  Service: Neurosurgery;  Laterality: Left;  3C   PAROTID GLAND TUMOR EXCISION Left 2003   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   Procedure: PERIPHERAL VASCULAR INTERVENTION;  Surgeon: Waynetta Sandy, MD;  Location: [facility];  Service: Cardiovascular;;  celiac   TOTAL SHOULDER ARTHROPLASTY Right 08/06/2022   Procedure: RIGHT REVERSE SHOULDER ARTHROPLASTY;  Surgeon: Meredith Pel, MD;  Location: [facility];  Service: Orthopedics;  Laterality: Right;   VISCERAL ANGIOGRAPHY N/A 05/18/2022   Procedure: VISCERAL ANGIOGRAPHY;  Surgeon: Waynetta Sandy, MD;  Location: [facility];  Service: Cardiovascular;  Laterality: N/A;   Social History   Occupational History   Not on file  Tobacco Use   Smoking status: Every Day    Packs/day: 1.50    Years: 31.00    Total pack years: 46.50    Types: Cigarettes    Passive exposure: Current   Smokeless tobacco: Never   Tobacco comments:    Smokes a pack and a half a day [facility] 04/02/22  Vaping Use   Vaping Use: Never used  Substance and Sexual Activity   Alcohol use: Not Currently   Drug use: Never   Sexual activity: Not Currently

## 2022-08-27 ENCOUNTER — Other Ambulatory Visit: Payer: Self-pay | Admitting: Internal Medicine

## 2022-08-27 DIAGNOSIS — K2970 Gastritis, unspecified, without bleeding: Secondary | ICD-10-CM

## 2022-08-28 ENCOUNTER — Ambulatory Visit (INDEPENDENT_AMBULATORY_CARE_PROVIDER_SITE_OTHER): Payer: PPO | Admitting: Surgical

## 2022-08-28 DIAGNOSIS — Z96611 Presence of right artificial shoulder joint: Secondary | ICD-10-CM

## 2022-08-29 ENCOUNTER — Encounter: Payer: Self-pay | Admitting: Orthopedic Surgery

## 2022-08-29 NOTE — Progress Notes (Signed)
Post-Op Visit Note   Patient: Alicia [name]           Date of Birth: 04/12/1946           MRN: [id number] Visit Date: 08/28/2022 PCP: [name], FNP   Assessment & Plan:  Chief Complaint: No chief complaint on file.  Visit Diagnoses:  1. History of arthroplasty of right shoulder     Plan: Patient is a 76 year old female who returns following right reverse shoulder arthroplasty on 08/06/2022.  She is here for incision recheck following her appointment last week.  She has a little bit of superficial gapping of the distal portion of the incision but this is significantly improved today on exam.  There is a small area of eschar noted in the midportion of the incision that is new today compared with last exam.  There is a small Vicryl suture knot that was removed from this area.  Anticipate this will continue to heal without any difficulty.  There is no expressible drainage or any redness.  No sign of infection.  She will continue to monitor these incisions and let us know with any changes.  Otherwise follow-up in 3 weeks .  Follow-Up Instructions: No follow-ups on file.   Orders:  No orders of the defined types were placed in this encounter.  No orders of the defined types were placed in this encounter.   Imaging: No results found.  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/2022   Centrilobular emphysema ([facility]) 02/13/2022   Cigarette smoker 02/06/2022   Asthmatic bronchitis , chronic 02/05/2022   Incidental pulmonary nodule, greater than or equal to 56m 01/21/2022   Bilateral carotid artery stenosis 12/26/2021   Iron deficiency anemia 12/26/2021   Mixed hyperlipidemia 12/26/2021   Chronic cough 12/26/2021   Adjustment  reaction with anxiety and depression 12/26/2021   Chronic venous insufficiency 02/29/2020   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/05/2019   Arthritis of shoulder 06/08/2018   GERD (gastroesophageal reflux disease) 06/08/2018   Macular degeneration 06/08/2018   Sleep apnea 06/08/2018   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   COPD with acute exacerbation ([facility] 12/26/2021   Depression    GERD (gastroesophageal reflux disease)    History of hiatal hernia    Macular degeneration    Positive fecal occult blood test 01/19/2022   Sleep apnea    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   R mid forearm, [facility]    Family History  Problem Relation Age of Onset   Heart disease Mother    Cerebral aneurysm Father     Past Surgical History:  Procedure Laterality Date   APPENDECTOMY     CHOLECYSTECTOMY  10/2016   EYE SURGERY Bilateral 2017   cataracts removed   LUMBAR LAMINECTOMY/DECOMPRESSION MICRODISCECTOMY Left 07/21/2021   Procedure: Laminectomy and Foraminotomy - L4-L5 - left;  Surgeon: CKary Kos MD;  Location: [facility]  Service: Neurosurgery;  Laterality: Left;  3C   PAROTID GLAND TUMOR EXCISION Left 2003   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   Procedure: PERIPHERAL  VASCULAR INTERVENTION;  Surgeon: Waynetta Sandy, MD;  Location: [facility];  Service: Cardiovascular;;  celiac   TOTAL SHOULDER ARTHROPLASTY Right 08/06/2022   Procedure: RIGHT REVERSE SHOULDER ARTHROPLASTY;  Surgeon: Meredith Pel, MD;  Location: [facility];  Service: Orthopedics;  Laterality: Right;   VISCERAL ANGIOGRAPHY N/A 05/18/2022   Procedure: VISCERAL ANGIOGRAPHY;  Surgeon: Waynetta Sandy, MD;  Location: [facility];  Service: Cardiovascular;  Laterality: N/A;   Social History   Occupational History   Not on file  Tobacco  Use   Smoking status: Every Day    Packs/day: 1.50    Years: 31.00    Total pack years: 46.50    Types: Cigarettes    Passive exposure: Current   Smokeless tobacco: Never   Tobacco comments:    Smokes a pack and a half a day [facility] 04/02/22  Vaping Use   Vaping Use: Never used  Substance and Sexual Activity   Alcohol use: Not Currently   Drug use: Never   Sexual activity: Not Currently

## 2022-09-02 ENCOUNTER — Other Ambulatory Visit: Payer: Self-pay | Admitting: Family

## 2022-09-02 DIAGNOSIS — F4323 Adjustment disorder with mixed anxiety and depressed mood: Secondary | ICD-10-CM

## 2022-09-04 ENCOUNTER — Other Ambulatory Visit: Payer: Self-pay | Admitting: Physical Medicine and Rehabilitation

## 2022-09-04 ENCOUNTER — Other Ambulatory Visit: Payer: Self-pay | Admitting: Family

## 2022-09-08 ENCOUNTER — Ambulatory Visit (INDEPENDENT_AMBULATORY_CARE_PROVIDER_SITE_OTHER): Payer: PPO

## 2022-09-08 VITALS — Ht 60.0 in | Wt 197.0 lb

## 2022-09-08 DIAGNOSIS — Z0000 Encounter for general adult medical examination without abnormal findings: Secondary | ICD-10-CM | POA: Diagnosis not present

## 2022-09-08 NOTE — Patient Instructions (Addendum)
Alicia [name] , Thank you for taking time to come for your Medicare Wellness Visit. I appreciate your ongoing commitment to your health goals. Please review the following plan we discussed and let me know if I can assist you in the future.   These are the goals we discussed:  Goals      Maintain Healthy Lifestyle     Stay active Healthy diet Stay hydrated Stop smoking        This is a list of the screening recommended for you and due dates:  Health Maintenance  Topic Date Due   COVID-19 Vaccine (3 - Pfizer risk series) 09/24/2022*   Tetanus Vaccine  12/01/2022*   Zoster (Shingles) Vaccine (1 of 2) 12/09/2022*   Pneumonia Vaccine (1 - PCV) 02/06/2023*   Flu Shot  02/07/2023*   Hepatitis C Screening: USPSTF Recommendation to screen - Ages 18-79 yo.  09/09/2023*   Screening for Lung Cancer  03/03/2023   Medicare Annual Wellness Visit  09/09/2023   Colon Cancer Screening  05/08/2029   DEXA scan (bone density measurement)  Completed   HPV Vaccine  Aged Out  *Topic was postponed. The date shown is not the original due date.    Advanced directives: on file.  Conditions/risks identified: none new.   Next appointment: Follow up in one year for your annual wellness visit.   Preventive Care 77 Years and Older, Female Preventive care refers to lifestyle choices and visits with your health care provider that can promote health and wellness. What does preventive care include? A yearly physical exam. This is also called an annual well check. Dental exams once or twice a year. Routine eye exams. Ask your health care provider how often you should have your eyes checked. Personal lifestyle choices, including: Daily care of your teeth and gums. Regular physical activity. Eating a healthy diet. Avoiding tobacco and drug use. Limiting alcohol use. Practicing safe sex. Taking low-dose aspirin every day. Taking vitamin and mineral supplements as recommended by your health care  provider. What happens during an annual well check? The services and screenings done by your health care provider during your annual well check will depend on your age, overall health, lifestyle risk factors, and family history of disease. Counseling  Your health care provider may ask you questions about your: Alcohol use. Tobacco use. Drug use. Emotional well-being. Home and relationship well-being. Sexual activity. Eating habits. History of falls. Memory and ability to understand (cognition). Work and work Statistician. Reproductive health. Screening  You may have the following tests or measurements: Height, weight, and BMI. Blood pressure. Lipid and cholesterol levels. These may be checked every 5 years, or more frequently if you are over 77 years old. Skin check. Lung cancer screening. You may have this screening every year starting at age 77 if you have a 30-pack-year history of smoking and currently smoke or have quit within the past 15 years. Fecal occult blood test (FOBT) of the stool. You may have this test every year starting at age 77. Flexible sigmoidoscopy or colonoscopy. You may have a sigmoidoscopy every 5 years or a colonoscopy every 10 years starting at age 77. Hepatitis C blood test. Hepatitis B blood test. Sexually transmitted disease (STD) testing. Diabetes screening. This is done by checking your blood sugar (glucose) after you have not eaten for a while (fasting). You may have this done every 1-3 years. Bone density scan. This is done to screen for osteoporosis. You may have this done starting at age 77.  Mammogram. This may be done every 1-2 years. Talk to your health care provider about how often you should have regular mammograms. Talk with your health care provider about your test results, treatment options, and if necessary, the need for more tests. Vaccines  Your health care provider may recommend certain vaccines, such as: Influenza vaccine. This is  recommended every year. Tetanus, diphtheria, and acellular pertussis (Tdap, Td) vaccine. You may need a Td booster every 10 years. Zoster vaccine. You may need this after age 27. Pneumococcal 13-valent conjugate (PCV13) vaccine. One dose is recommended after age 77. Pneumococcal polysaccharide (PPSV23) vaccine. One dose is recommended after age 77. Talk to your health care provider about which screenings and vaccines you need and how often you need them. This information is not intended to replace advice given to you by your health care provider. Make sure you discuss any questions you have with your health care provider. Document Released: 11/22/2015 Document Revised: 07/15/2016 Document Reviewed: 08/27/2015 Elsevier Interactive Patient Education  2017 [place] Prevention in the Home Falls can cause injuries. They can happen to people of all ages. There are many things you can do to make your home safe and to help prevent falls. What can I do on the outside of my home? Regularly fix the edges of walkways and driveways and fix any cracks. Remove anything that might make you trip as you walk through a door, such as a raised step or threshold. Trim any bushes or trees on the path to your home. Use bright outdoor lighting. Clear any walking paths of anything that might make someone trip, such as rocks or tools. Regularly check to see if handrails are loose or broken. Make sure that both sides of any steps have handrails. Any raised decks and porches should have guardrails on the edges. Have any leaves, snow, or ice cleared regularly. Use sand or salt on walking paths during winter. Clean up any spills in your garage right away. This includes oil or grease spills. What can I do in the bathroom? Use night lights. Install grab bars by the toilet and in the tub and shower. Do not use towel bars as grab bars. Use non-skid mats or decals in the tub or shower. If you need to sit down in  the shower, use a plastic, non-slip stool. Keep the floor dry. Clean up any water that spills on the floor as soon as it happens. Remove soap buildup in the tub or shower regularly. Attach bath mats securely with double-sided non-slip rug tape. Do not have throw rugs and other things on the floor that can make you trip. What can I do in the bedroom? Use night lights. Make sure that you have a light by your bed that is easy to reach. Do not use any sheets or blankets that are too big for your bed. They should not hang down onto the floor. Have a firm chair that has side arms. You can use this for support while you get dressed. Do not have throw rugs and other things on the floor that can make you trip. What can I do in the kitchen? Clean up any spills right away. Avoid walking on wet floors. Keep items that you use a lot in easy-to-reach places. If you need to reach something above you, use a strong step stool that has a grab bar. Keep electrical cords out of the way. Do not use floor polish or wax that makes floors slippery. If  you must use wax, use non-skid floor wax. Do not have throw rugs and other things on the floor that can make you trip. What can I do with my stairs? Do not leave any items on the stairs. Make sure that there are handrails on both sides of the stairs and use them. Fix handrails that are broken or loose. Make sure that handrails are as long as the stairways. Check any carpeting to make sure that it is firmly attached to the stairs. Fix any carpet that is loose or worn. Avoid having throw rugs at the top or bottom of the stairs. If you do have throw rugs, attach them to the floor with carpet tape. Make sure that you have a light switch at the top of the stairs and the bottom of the stairs. If you do not have them, ask someone to add them for you. What else can I do to help prevent falls? Wear shoes that: Do not have high heels. Have rubber bottoms. Are comfortable  and fit you well. Are closed at the toe. Do not wear sandals. If you use a stepladder: Make sure that it is fully opened. Do not climb a closed stepladder. Make sure that both sides of the stepladder are locked into place. Ask someone to hold it for you, if possible. Clearly mark and make sure that you can see: Any grab bars or handrails. First and last steps. Where the edge of each step is. Use tools that help you move around (mobility aids) if they are needed. These include: Canes. Walkers. Scooters. Crutches. Turn on the lights when you go into a dark area. Replace any light bulbs as soon as they burn out. Set up your furniture so you have a clear path. Avoid moving your furniture around. If any of your floors are uneven, fix them. If there are any pets around you, be aware of where they are. Review your medicines with your doctor. Some medicines can make you feel dizzy. This can increase your chance of falling. Ask your doctor what other things that you can do to help prevent falls. This information is not intended to replace advice given to you by your health care provider. Make sure you discuss any questions you have with your health care provider. Document Released: 08/22/2009 Document Revised: 04/02/2016 Document Reviewed: 11/30/2014 Elsevier Interactive Patient Education  2017 [employer].

## 2022-09-08 NOTE — Progress Notes (Signed)
Subjective:   Alicia [name] is a 76 y.o. female who presents for an Initial Medicare Annual Wellness Visit.  Review of Systems    No ROS.  Medicare Wellness Virtual Visit.  Visual/audio telehealth visit, UTA vital signs.   See social history for additional risk factors.   Cardiac Risk Factors include: advanced age (>49mn, >>4women)     Objective:    Today's Vitals   09/08/22 1033  Weight: 197 lb (89.4 kg)  Height: 5' (1.524 m)   Body mass index is 38.47 kg/m.     09/08/2022   10:43 AM 08/07/2022    1:00 AM 07/29/2022   11:31 AM 07/21/2022    1:44 PM 05/18/2022    6:09 AM 04/28/2022    1:00 PM 04/21/2022    1:15 PM  Advanced Directives  Does Patient Have a Medical Advance Directive? Yes Yes Yes Yes Yes Yes Yes  Type of AParamedicof [place]ColfaxLiving will [place]CollinwoodLiving will HF[place]Living will Living will;Healthcare Power of [place]restonLiving will  [place]Living will  Does patient want to make changes to medical advance directive? No - Patient declined No - Guardian declined No - Patient declined  No - Patient declined No - Patient declined   Copy of HLa [place] Chart? Yes - validated most recent copy scanned in chart (See row information) Yes - validated most recent copy scanned in chart (See row information) Yes - validated most recent copy scanned in chart (See row information)        Current Medications (verified) Outpatient Encounter Medications as of 09/08/2022  Medication Sig   acetaminophen (TYLENOL) 500 MG tablet Take 1,000 mg by mouth every 6 (six) hours as needed for moderate pain.   albuterol (VENTOLIN HFA) 108 (90 Base) MCG/ACT inhaler Inhale 1-2 puffs into the lungs 4 (four) times daily as needed for shortness of breath or wheezing.   aspirin EC 81 MG tablet Take 1 tablet (81 mg total) by mouth daily.    Budeson-Glycopyrrol-Formoterol (BREZTRI AEROSPHERE) 160-9-4.8 MCG/ACT AERO Inhale 2 puffs into the lungs 2 (two) times daily. (Patient taking differently: Inhale 2 puffs into the lungs once.)   celecoxib (CELEBREX) 100 MG capsule Take 1 capsule (100 mg total) by mouth 2 (two) times daily.   Cholecalciferol (VITAMIN D3) 125 MCG (5000 UT) CAPS Take 5,000 Units by mouth in the morning.   [employer] TRIPLE MAGNESIUM COMPLEX PO Take 1 capsule by mouth at bedtime.   faricimab-svoa (VABYSMO) 6 MG/0.05ML SOLN intravitreal injection 6 mg by Intravitreal route every 8 (eight) weeks.   gabapentin (NEURONTIN) 300 MG capsule TAKE 1 CAPSULE BY MOUTH THREE TIMES A DAY   hydrocortisone 2.5 % cream Apply 1 Application topically daily as needed (rash/irritation). Apply to itchy rash on face qd up to 4 days a week prn flares   Lutein 20 MG TABS Take 20 mg by mouth in the morning.   methocarbamol (ROBAXIN) 500 MG tablet Take 1 tablet (500 mg total) by mouth every 8 (eight) hours as needed for muscle spasms.   omeprazole (PRILOSEC) 40 MG capsule TAKE 1 CAPSULE (40 MG TOTAL) BY MOUTH DAILY.   pramipexole (MIRAPEX) 0.5 MG tablet TAKE 1 TABLET BY MOUTH TWICE A DAY   rosuvastatin (CRESTOR) 10 MG tablet Take 1 tablet (10 mg total) by mouth daily.   venlafaxine XR (EFFEXOR-XR) 37.5 MG 24 hr capsule TAKE 1 CAPSULE (37.5 MG TOTAL) BY  MOUTH AT BEDTIME.   No facility-administered encounter medications on file as of 09/08/2022.    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   R mid  forearm, [facility]   Past Surgical History:  Procedure Laterality Date   APPENDECTOMY     CHOLECYSTECTOMY  10/2016   EYE SURGERY Bilateral 2017   cataracts removed   LUMBAR LAMINECTOMY/DECOMPRESSION MICRODISCECTOMY Left 07/21/2021   Procedure: Laminectomy and Foraminotomy - L4-L5 - left;  Surgeon: Kary Kos, MD;  Location: [facility];  Service: Neurosurgery;  Laterality: Left;  3C   PAROTID GLAND TUMOR EXCISION Left 2003   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   Procedure: PERIPHERAL VASCULAR INTERVENTION;  Surgeon: Waynetta Sandy, MD;  Location: [facility];  Service: Cardiovascular;;  celiac   TOTAL SHOULDER ARTHROPLASTY Right 08/06/2022   Procedure: RIGHT REVERSE SHOULDER ARTHROPLASTY;  Surgeon: Meredith Pel, MD;  Location: [facility];  Service: Orthopedics;  Laterality: Right;   VISCERAL ANGIOGRAPHY N/A 05/18/2022   Procedure: VISCERAL ANGIOGRAPHY;  Surgeon: Waynetta Sandy, MD;  Location: [facility];  Service: Cardiovascular;  Laterality: N/A;   Family History  Problem Relation Age of Onset   Heart disease Mother    Cerebral aneurysm Father    Social History   Socioeconomic History   Marital status: Widowed    Spouse name: Not on file   Number of children: 0   Years of education: Not on file   Highest education level: Not on file  Occupational History   Not on file  Tobacco Use   Smoking status: Every Day    Packs/day: 1.50    Years: 31.00    Total pack years: 46.50    Types: Cigarettes    Passive exposure: Current   Smokeless tobacco: Never   Tobacco comments:    Smokes a pack and a half a day [facility] 04/02/22  Vaping Use   Vaping Use: Never used  Substance and Sexual Activity   Alcohol use: Not Currently   Drug use: Never   Sexual activity: Not Currently  Other Topics Concern   Not on file  Social History Narrative   Had 1 child, but child is deceased   Social Determinants of Health   Financial Resource Strain: Low Risk   (09/08/2022)   Overall Financial Resource Strain (CARDIA)    Difficulty of Paying Living Expenses: Not hard at all  Food Insecurity: No Food Insecurity (09/08/2022)   Hunger Vital Sign    Worried About Running Out of Food in the Last Year: Never true    Ran Out of Food in the Last Year: Never true  Transportation Needs: No Transportation Needs (09/08/2022)   PRAPARE - Hydrologist (Medical): No    Lack of Transportation (Non-Medical): No  Physical Activity: Sufficiently Active (09/08/2022)   Exercise Vital Sign    Days of Exercise per Week: 7 days    Minutes of Exercise per Session:  40 min  Stress: No Stress Concern Present (09/08/2022)   [employer] [place]    Feeling of Stress : Not at all  Social Connections: Unknown (09/08/2022)   Social Connection and Isolation Panel [NHANES]    Frequency of Communication with Friends and Family: More than three times a week    Frequency of Social Gatherings with Friends and Family: More than three times a week    Attends Religious Services: Not on Advertising copywriter or Organizations: Not on file    Attends Archivist Meetings: Not on file    Marital Status: Not on file    Tobacco Counseling Ready to quit: Not Answered Counseling given: Not Answered Tobacco comments: Smokes a pack and a half a day MRC 04/02/22   Clinical Intake:  Pre-visit preparation completed: Yes        Diabetes: No  How often do you need to have someone help you when you read instructions, pamphlets, or other written materials from your doctor or pharmacy?: 2 - Rarely    Interpreter Needed?: No      Activities of Daily Living    09/08/2022   10:46 AM 08/07/2022    1:00 AM  In your present state of health, do you have any difficulty performing the following activities:  Hearing? 0 0  Vision? 0 0  Difficulty concentrating or making decisions? 0 0   Walking or climbing stairs? 1 0  Comment Cane in use when ambulating. Paces self with activity   Dressing or bathing? 0 0  Doing errands, shopping? 0 0  Preparing Food and eating ? N   Using the Toilet? N   In the past six months, have you accidently leaked urine? Y   Comment Managed with daily pad   Do you have problems with loss of bowel control? N   Managing your Medications? N   Managing your Finances? N   Housekeeping or managing your Housekeeping? N     Patient Care Team: [name], FNP as PCP - General (Family Medicine)  Indicate any recent Medical Services you may have received from other than [facility] providers in the past year (date may be approximate).     Assessment:   This is a routine wellness examination for [place]wn.  I connected with  Aarin H Lamping on 09/08/22 by a audio enabled telemedicine application and verified that I am speaking with the correct person using two identifiers.  Patient Location: Home  Provider Location: Office/Clinic  I discussed the limitations of evaluation and management by telemedicine. The patient expressed understanding and agreed to proceed.   Hearing/Vision screen Hearing Screening - Comments:: Patient is able to hear conversational tones without difficulty.  No issues reported.   Vision Screening - Comments:: Followed by [facility] Wears corrective lenses Cataract extraction, bilateral Macular degeneration; injections every 12 weeks They have seen their ophthalmologist in the last 12 months.    Dietary issues and exercise activities discussed: Current Exercise Habits: Home exercise routine, Type of exercise: stretching, Time (Minutes): 40, Frequency (Times/Week): 7, Weekly Exercise (Minutes/Week): 280, Intensity: Mild Regular diet    Goals Addressed             This Visit's Progress    Maintain Healthy Lifestyle       Stay active Healthy diet Stay hydrated Stop smoking       Depression  Screen    09/08/2022   10:42 AM  PHQ 2/9 Scores  PHQ - 2 Score 0    Fall Risk    09/08/2022   10:44 AM 01/19/2022   12:22 PM  Fall Risk   Falls in the past year? 0 0  Number falls in past yr: 0 0  Injury with Fall? 0 0  Risk for fall due to : Impaired balance/gait   Risk for fall due to: Comment Cane in use   Follow up Falls evaluation completed     [place]: Home free of loose throw rugs in walkways, pet beds, electrical cords, etc? Yes  Adequate lighting in your home to reduce risk of falls? Yes   ASSISTIVE DEVICES UTILIZED TO PREVENT FALLS: Life alert? No  Alarm system? Yes Use of a cane, walker or w/c? Yes  Grab bars in the bathroom? Yes  Shower chair or bench in shower? No  Elevated toilet seat or a handicapped toilet? Yes   TIMED UP AND GO: Was the test performed? No .   Cognitive Function:        09/08/2022   10:47 AM  6CIT Screen  What Year? 0 points  What month? 0 points  What time? 0 points  Count back from 20 0 points  Months in reverse 0 points  Repeat phrase 0 points  Total Score 0 points    Immunizations Immunization History  Administered Date(s) Administered   Influenza-Unspecified 09/09/2021   PFIZER(Purple Top)SARS-COV-2 Vaccination 01/19/2020, 02/12/2020    TDAP status: Due, Education has been provided regarding the importance of this vaccine. Advised may receive this vaccine at local pharmacy or Health Dept. Aware to provide a copy of the vaccination record if obtained from local pharmacy or Health Dept. Verbalized acceptance and understanding.  Flu Vaccine status: Due, Education has been provided regarding the importance of this vaccine. Advised may receive this vaccine at local pharmacy or Health Dept. Aware to provide a copy of the vaccination record if obtained from local pharmacy or Health Dept. Verbalized acceptance and understanding.  Covid-19 vaccine status: Completed vaccines x2.  Shingrix  Completed?: No.    Education has been provided regarding the importance of this vaccine. Patient has been advised to call insurance company to determine out of pocket expense if they have not yet received this vaccine. Advised may also receive vaccine at local pharmacy or Health Dept. Verbalized acceptance and understanding.  Screening Tests Health Maintenance  Topic Date Due   COVID-19 Vaccine (3 - Pfizer risk series) 09/24/2022 (Originally 03/11/2020)   TETANUS/TDAP  12/01/2022 (Originally 10/08/1965)   Zoster Vaccines- Shingrix (1 of 2) 12/09/2022 (Originally 10/08/1965)   Pneumonia Vaccine 34+ Years old (1 - PCV) 02/06/2023 (Originally 10/09/1952)   INFLUENZA VACCINE  02/07/2023 (Originally 06/09/2022)   Hepatitis C Screening  09/09/2023 (Originally 10/09/1964)   Lung Cancer Screening  03/03/2023   Medicare Annual Wellness (AWV)  09/09/2023   COLONOSCOPY (Pts 45-23yr Insurance coverage will need to be confirmed)  05/08/2029   DEXA SCAN  Completed   HPV VACCINES  Aged Out    Health Maintenance There are no preventive care reminders to display for this patient.  CT Chest LCS Nodule F/U Low Dose: completed 03/02/22.  Hepatitis C Screening: Completed deferred per patient.   Vision Screening: Recommended annual ophthalmology exams for early detection of glaucoma and other disorders of the eye.  Dental Screening: Recommended annual dental exams for proper oral hygiene.  Community Resource Referral / Chronic Care Management: CRR required this visit?  No  CCM required this visit?  No      Plan:     I have personally reviewed and noted the following in the patient's chart:   Medical and social history Use of alcohol, tobacco or illicit drugs  Current medications and supplements including opioid prescriptions. Patient is not currently taking opioid prescriptions. Functional ability and status Nutritional status Physical activity Advanced directives List of other  physicians Hospitalizations, surgeries, and ER visits in previous 12 months Vitals Screenings to include cognitive, depression, and falls Referrals and appointments  In addition, I have reviewed and discussed with patient certain preventive protocols, quality metrics, and best practice recommendations. A written personalized care plan for preventive services as well as general preventive health recommendations were provided to patient.     Leta Jungling, LPN   02/40/9735

## 2022-09-18 ENCOUNTER — Ambulatory Visit (INDEPENDENT_AMBULATORY_CARE_PROVIDER_SITE_OTHER): Payer: PPO | Admitting: Orthopedic Surgery

## 2022-09-18 DIAGNOSIS — M19012 Primary osteoarthritis, left shoulder: Secondary | ICD-10-CM

## 2022-09-19 ENCOUNTER — Encounter: Payer: Self-pay | Admitting: Orthopedic Surgery

## 2022-09-19 NOTE — Progress Notes (Signed)
Post-Op Visit Note   Patient: Alicia [name]           Date of Birth: 1946/02/16           MRN: [id number] Visit Date: 09/18/2022 PCP: [name], FNP   Assessment & Plan:  Chief Complaint:  Chief Complaint  Patient presents with   Right Shoulder - Routine Post Op    R RSA (surgery date 08-06-22)   Visit Diagnoses:  1. Primary osteoarthritis, left shoulder     Plan: [name] is a 76 year old patient who underwent right reverse shoulder replacement 08/06/2022.  She is doing well.  Does not really report much pain.  On examination of that right shoulder she has passive and active motion of 40/105/160.  Subscap strength intact.  No acromial tenderness.  She has finished physical therapy.  She is having significant left shoulder pain.  She would like to have a reverse replacement performed on this shoulder as well.  On examination of the left shoulder she does have reasonable subscap strength but there is restricted range of motion and coarse grinding consistent with bone-on-bone arthritis.  Plan at this time is patient is doing well following her right reverse shoulder replacement.  Thin cut CT scan for preoperative planning for left shoulder replacement performed.  We will get this set up for her sometime next year.  The risk and benefits of the procedure discussed with the patient include not limited to infection or vessel damage instability incomplete pain relief as well as some loss of function.  Patient understands risk and benefits and our plan would be to match implant size left shoulder with the right shoulder.  Follow-Up Instructions: No follow-ups on file.   Orders:  Orders Placed This Encounter  Procedures   CT SHOULDER LEFT WO CONTRAST   No orders of the defined types were placed in this encounter.   Imaging: No results found.  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/2022   Centrilobular emphysema ([place]) 02/13/2022   Cigarette smoker 02/06/2022   Asthmatic bronchitis , chronic 02/05/2022   Incidental pulmonary nodule, greater than or equal to 97m 01/21/2022   Bilateral carotid artery stenosis 12/26/2021   Iron deficiency anemia 12/26/2021   Mixed hyperlipidemia 12/26/2021   Chronic cough 12/26/2021   Adjustment reaction with anxiety and depression 12/26/2021   Chronic venous insufficiency 02/29/2020   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/05/2019   Arthritis of shoulder 06/08/2018   GERD (gastroesophageal reflux disease) 06/08/2018   Macular degeneration 06/08/2018   Sleep apnea 06/08/2018   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   COPD [place]ation [place] 12/26/2021   Depression    GERD (gastroesophageal reflux disease)    History of hiatal hernia    Macular degeneration    Positive fecal occult blood test 01/19/2022   Sleep apnea    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   R mid forearm, [facility]    Family History  Problem Relation Age of Onset   Heart disease Mother    Cerebral aneurysm Father  Past Surgical History:  Procedure Laterality Date   APPENDECTOMY     CHOLECYSTECTOMY  10/2016   EYE SURGERY Bilateral 2017   cataracts removed   LUMBAR LAMINECTOMY/DECOMPRESSION MICRODISCECTOMY Left 07/21/2021   Procedure: Laminectomy and Foraminotomy - L4-L5 - left;  Surgeon: Kary Kos, MD;  Location: [facility];  Service: Neurosurgery;  Laterality: Left;  3C   PAROTID GLAND TUMOR EXCISION Left 2003   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   Procedure: PERIPHERAL VASCULAR INTERVENTION;  Surgeon: Waynetta Sandy, MD;  Location: [facility];  Service: Cardiovascular;;  celiac   TOTAL SHOULDER ARTHROPLASTY Right 08/06/2022   Procedure: RIGHT REVERSE SHOULDER ARTHROPLASTY;  Surgeon: Meredith Pel, MD;  Location: [facility];  Service: Orthopedics;  Laterality: Right;   VISCERAL ANGIOGRAPHY N/A 05/18/2022   Procedure: VISCERAL ANGIOGRAPHY;  Surgeon: Waynetta Sandy, MD;  Location: [facility];  Service: Cardiovascular;  Laterality: N/A;   Social History   Occupational History   Not on file  Tobacco Use   Smoking status: Every Day    Packs/day: 1.50    Years: 31.00    Total pack years: 46.50    Types: Cigarettes    Passive exposure: Current   Smokeless tobacco: Never   Tobacco comments:    Smokes a pack and a half a day [facility] 04/02/22  Vaping Use   Vaping Use: Never used  Substance and Sexual Activity   Alcohol use: Not Currently   Drug use: Never   Sexual activity: Not Currently

## 2022-09-30 ENCOUNTER — Ambulatory Visit
Admission: RE | Admit: 2022-09-30 | Discharge: 2022-09-30 | Disposition: A | Discharge status: Home / Self Care | Payer: PPO | Source: Ambulatory Visit | Attending: Orthopedic Surgery | Admitting: Orthopedic Surgery

## 2022-09-30 DIAGNOSIS — M19012 Primary osteoarthritis, left shoulder: Secondary | ICD-10-CM

## 2022-10-05 NOTE — Progress Notes (Signed)
Blue sheet done thx

## 2022-10-16 ENCOUNTER — Ambulatory Visit (INDEPENDENT_AMBULATORY_CARE_PROVIDER_SITE_OTHER): Payer: PPO | Admitting: Family

## 2022-10-16 ENCOUNTER — Encounter: Payer: Self-pay | Admitting: Family

## 2022-10-16 VITALS — BP 118/56 | HR 89 | Temp 98.0°F | Resp 16 | Ht 60.0 in | Wt 200.2 lb

## 2022-10-16 DIAGNOSIS — J440 Chronic obstructive pulmonary disease with acute lower respiratory infection: Secondary | ICD-10-CM

## 2022-10-16 DIAGNOSIS — F4323 Adjustment disorder with mixed anxiety and depressed mood: Secondary | ICD-10-CM

## 2022-10-16 DIAGNOSIS — J209 Acute bronchitis, unspecified: Secondary | ICD-10-CM

## 2022-10-16 MED ORDER — DOXYCYCLINE HYCLATE 100 MG PO TABS: 100.0000 mg | ORAL_TABLET | Freq: Two times a day (BID) | ORAL | 0 refills | Status: AC

## 2022-10-16 MED ORDER — VENLAFAXINE HCL ER 75 MG PO CP24: 75.0000 mg | ORAL_CAPSULE | Freq: Every day | ORAL | 0 refills | Status: DC

## 2022-10-16 MED ORDER — PREDNISONE 20 MG PO TABS: ORAL_TABLET | ORAL | 0 refills | Status: DC

## 2022-10-16 NOTE — Patient Instructions (Addendum)
Increase effexor to 75 mcg once daily.   Take antibiotic as prescribed. Increase oral fluids. Pt to f/u if sx worsen and or fail to improve in 2-3 days.   Regards,   [name] FNP-C

## 2022-10-16 NOTE — Progress Notes (Unsigned)
Established Patient Office Visit  Subjective:  Patient ID: Alicia [name], female    DOB: 01-27-1946  Age: 76 y.o. MRN: [id number]  CC:  Chief Complaint  Patient presents with   Sinusitis    X 2 weeks   Fever    Low grade   Fatigue    HPI Alicia [name] is here today with concerns.   Over the last two weeks not feeling well.  Low grade fever, increased fatigue, sinus pressure and headache ongoing.  Shortness of breath, not noticing she is wheezing. More DOE pronouched.  Coughing with productive cough that is clear.  Slight sore throat, slight ear fullness bil.  Gargling with salt water which has helped some.   Not being able to rest which has increased her depression.  Goes to bed early because she doesn't have people she can really talk to she  can't read because of her eyes , and doesn't like to watch tv. She will sleep for about 2-3 hours and then up by ten pm, wakes up every two hours until around 2 am where she can't fall back to sleep at all. She's uses her cpap at night but doesn't feel this is what is exacerbating her symptoms. Tested initially dec 2019.    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   R mid forearm, [facility]    Past Surgical History:  Procedure Laterality Date   APPENDECTOMY     CHOLECYSTECTOMY  10/2016   EYE SURGERY Bilateral 2017   cataracts removed   LUMBAR LAMINECTOMY/DECOMPRESSION MICRODISCECTOMY Left 07/21/2021   Procedure: Laminectomy and Foraminotomy - L4-L5 - left;  Surgeon: Kary Kos, MD;  Location: [facility];  Service: Neurosurgery;   Laterality: Left;  3C   PAROTID GLAND TUMOR EXCISION Left 2003   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   Procedure: PERIPHERAL VASCULAR INTERVENTION;  Surgeon: Waynetta Sandy, MD;  Location: [facility];  Service: Cardiovascular;;  celiac   TOTAL SHOULDER ARTHROPLASTY Right 08/06/2022   Procedure: RIGHT REVERSE SHOULDER ARTHROPLASTY;  Surgeon: Meredith Pel, MD;  Location: [facility];  Service: Orthopedics;  Laterality: Right;   VISCERAL ANGIOGRAPHY N/A 05/18/2022   Procedure: VISCERAL ANGIOGRAPHY;  Surgeon: Waynetta Sandy, MD;  Location: [facility];  Service: Cardiovascular;  Laterality: N/A;    Family History  Problem Relation Age of Onset   Heart disease Mother    Cerebral aneurysm Father     Social History   Socioeconomic History   Marital status: Widowed    Spouse name: Not on file   Number of children: 0   Years of education: Not on file   Highest education level: Not on file  Occupational History   Not on file  Tobacco Use   Smoking status: Every Day    Packs/day: 1.50    Years: 31.00    Total pack years: 46.50    Types: Cigarettes    Passive exposure: Current   Smokeless tobacco: Never  Tobacco comments:    Smokes a pack and a half a day MR[facility] 04/02/22  Vaping Use   Vaping Use: Never used  Substance and Sexual Activity   Alcohol use: Not Currently   Drug use: Never   Sexual activity: Not Currently  Other Topics Concern   Not on file  Social History Narrative   Had 1 child, but child is deceased   Social Determinants of Health   Financial Resource Strain: Low Risk  (09/08/2022)   Overall Financial Resource Strain (CARDIA)    Difficulty of Paying Living Expenses: Not hard at all  Food Insecurity: No Food Insecurity (09/08/2022)   Hunger Vital Sign    Worried About Running Out of Food in the Last Year: Never true    Ran Out of Food in the Last Year: Never true  Transportation Needs: No Transportation Needs  (09/08/2022)   PRAPARE - Hydrologist (Medical): No    Lack of Transportation (Non-Medical): No  Physical Activity: Sufficiently Active (09/08/2022)   Exercise Vital Sign    Days of Exercise per Week: 7 days    Minutes of Exercise per Session: 40 min  Stress: No Stress Concern Present (09/08/2022)   [place]    Feeling of Stress : Not at all  Social Connections: Unknown (09/08/2022)   Social Connection and Isolation Panel [NHANES]    Frequency of Communication with Friends and Family: More than three times a week    Frequency of Social Gatherings with Friends and Family: More than three times a week    Attends Religious Services: Not on file    Active Member of [place] or Organizations: Not on file    Attends Archivist Meetings: Not on file    Marital Status: Not on file  Intimate Partner Violence: Not At Risk (09/08/2022)   Humiliation, Afraid, Rape, and Kick questionnaire    Fear of Current or Ex-Partner: No    Emotionally Abused: No    Physically Abused: No    Sexually Abused: No    Outpatient Medications Prior to Visit  Medication Sig Dispense Refill   acetaminophen (TYLENOL) 500 MG tablet Take 1,000 mg by mouth every 6 (six) hours as needed for moderate pain.     albuterol (VENTOLIN HFA) 108 (90 Base) MCG/ACT inhaler Inhale 1-2 puffs into the lungs 4 (four) times daily as needed for shortness of breath or wheezing. 1 each 0   aspirin EC 81 MG tablet Take 1 tablet (81 mg total) by mouth daily. 14 tablet 0   Budeson-Glycopyrrol-Formoterol (BREZTRI AEROSPHERE) 160-9-4.8 MCG/ACT AERO Inhale 2 puffs into the lungs 2 (two) times daily. (Patient taking differently: Inhale 2 puffs into the lungs once.) 10.7 g 11   Cholecalciferol (VITAMIN D3) 125 MCG (5000 UT) CAPS Take 5,000 Units by mouth in the morning.     [employer] TRIPLE MAGNESIUM COMPLEX PO Take 1 capsule by mouth at bedtime.      faricimab-svoa (VABYSMO) 6 MG/0.05ML SOLN intravitreal injection 6 mg by Intravitreal route every 8 (eight) weeks.     gabapentin (NEURONTIN) 300 MG capsule TAKE 1 CAPSULE BY MOUTH THREE TIMES A DAY 270 capsule 1   hydrocortisone 2.5 % cream Apply 1 Application topically daily as needed (rash/irritation). Apply to itchy rash on face qd up to 4 days a week prn flares     Lutein 20 MG TABS Take 20 mg by mouth in the morning.  omeprazole (PRILOSEC) 40 MG capsule TAKE 1 CAPSULE (40 MG TOTAL) BY MOUTH DAILY. 90 capsule 1   pramipexole (MIRAPEX) 0.5 MG tablet TAKE 1 TABLET BY MOUTH TWICE A DAY 180 tablet 1   rosuvastatin (CRESTOR) 10 MG tablet Take 1 tablet (10 mg total) by mouth daily. 30 tablet 11   venlafaxine XR (EFFEXOR-XR) 37.5 MG 24 hr capsule TAKE 1 CAPSULE (37.5 MG TOTAL) BY MOUTH AT BEDTIME. 90 capsule 1   celecoxib (CELEBREX) 100 MG capsule Take 1 capsule (100 mg total) by mouth 2 (two) times daily. (Patient not taking: Reported on 10/16/2022) 60 capsule 0   methocarbamol (ROBAXIN) 500 MG tablet Take 1 tablet (500 mg total) by mouth every 8 (eight) hours as needed for muscle spasms. 30 tablet 1   No facility-administered medications prior to visit.    Allergies  Allergen Reactions   Penicillins Other (See Comments)    Yeast infection         Objective:    Physical Exam Constitutional:      General: She is not in acute distress.    Appearance: Normal appearance. She is not ill-appearing.  HENT:     Right Ear: Tympanic membrane normal.     Left Ear: Tympanic membrane normal.     Nose: Nose normal. No congestion or rhinorrhea.     Right Turbinates: Not enlarged or swollen.     Left Turbinates: Not enlarged or swollen.     Right Sinus: No maxillary sinus tenderness or frontal sinus tenderness.     Left Sinus: No maxillary sinus tenderness or frontal sinus tenderness.     Mouth/Throat:     Mouth: Mucous membranes are moist.     Pharynx: No pharyngeal swelling, oropharyngeal  exudate or posterior oropharyngeal erythema.     Tonsils: No tonsillar exudate.  Eyes:     Extraocular Movements: Extraocular movements intact.     Conjunctiva/sclera: Conjunctivae normal.     Pupils: Pupils are equal, round, and reactive to light.  Neck:     Thyroid: No thyroid mass.  Cardiovascular:     Rate and Rhythm: Normal rate and regular rhythm.  Pulmonary:     Effort: Pulmonary effort is normal.     Breath sounds: Normal breath sounds.  Lymphadenopathy:     Cervical:     Right cervical: No superficial cervical adenopathy.    Left cervical: No superficial cervical adenopathy.  Neurological:     Mental Status: She is alert.     BP (!) 118/56   Pulse 89   Temp 98 F (36.7 C)   Resp 16   Ht 5' (1.524 m)   Wt 200 lb 4 oz (90.8 kg)   SpO2 98%   BMI 39.11 kg/m  Wt Readings from Last 3 Encounters:  10/16/22 200 lb 4 oz (90.8 kg)  09/08/22 197 lb (89.4 kg)  08/06/22 197 lb (89.4 kg)     Health Maintenance Due  Topic Date Due   DTaP/Tdap/Td (1 - Tdap) Never done   COVID-19 Vaccine (3 - Pfizer risk series) 03/11/2020    There are no preventive care reminders to display for this patient.  Lab Results  Component Value Date   TSH 2.67 02/12/2022   Lab Results  Component Value Date   WBC 10.3 08/07/2022   HGB 9.1 (L) 08/07/2022   HCT 30.3 (L) 08/07/2022   MCV 88.1 08/07/2022   PLT 227 08/07/2022   Lab Results  Component Value Date   NA 140 07/29/2022  K 4.5 07/29/2022   CO2 26 07/29/2022   GLUCOSE 91 07/29/2022   BUN 8 07/29/2022   CREATININE 0.63 07/29/2022   BILITOT 0.4 01/13/2022   ALKPHOS 112 01/13/2022   AST 17 01/13/2022   ALT 10 01/13/2022   PROT 8.2 01/13/2022   ALBUMIN 4.4 01/13/2022   CALCIUM 9.4 07/29/2022   ANIONGAP 6 07/29/2022   GFR 79.97 01/13/2022   No results found for: "HGBA1C"    Assessment & Plan:   Problem List Items Addressed This Visit       Respiratory   Acute bronchitis with COPD ([place])   Relevant Medications    doxycycline (VIBRA-TABS) 100 MG tablet   predniSONE (DELTASONE) 20 MG tablet     Other   Adjustment reaction with anxiety and depression - Primary   Relevant Medications   venlafaxine XR (EFFEXOR XR) 75 MG 24 hr capsule   Other Relevant Orders   Ambulatory referral to Psychology    Meds ordered this encounter  Medications   venlafaxine XR (EFFEXOR XR) 75 MG 24 hr capsule    Sig: Take 1 capsule (75 mg total) by mouth daily with breakfast.    Dispense:  90 capsule    Refill:  0    Order Specific Question:   Supervising Provider    Answer:   BEDSOLE, AMY E [[id number]]   doxycycline (VIBRA-TABS) 100 MG tablet    Sig: Take 1 tablet (100 mg total) by mouth 2 (two) times daily for 10 days.    Dispense:  20 tablet    Refill:  0    Order Specific Question:   Supervising Provider    Answer:   BEDSOLE, AMY E [[id number]]   predniSONE (DELTASONE) 20 MG tablet    Sig: Take two tablets once daily for five days    Dispense:  10 tablet    Refill:  0    Order Specific Question:   Supervising Provider    Answer:   BEDSOLE, AMY E [[id number]]    Follow-up: No follow-ups on file.    [name], FNP

## 2022-10-17 NOTE — Assessment & Plan Note (Signed)
Increase venlafaxine to 75 mg once daily

## 2022-10-17 NOTE — Assessment & Plan Note (Signed)
RX doxycycline 100 mg po bid x 10 days  Rx prednisone 20 mg  Take antibiotic as prescribed. Increase oral fluids. Pt to f/u if sx worsen and or fail to improve in 2-3 days.

## 2022-10-19 ENCOUNTER — Inpatient Hospital Stay: Payer: PPO | Attending: Oncology

## 2022-10-19 ENCOUNTER — Encounter: Payer: Self-pay | Admitting: Family

## 2022-10-19 DIAGNOSIS — D509 Iron deficiency anemia, unspecified: Secondary | ICD-10-CM | POA: Insufficient documentation

## 2022-10-19 LAB — CBC WITH DIFFERENTIAL/PLATELET
Abs Immature Granulocytes: 0.07 10*3/uL (ref 0.00–0.07)
Basophils Absolute: 0 10*3/uL (ref 0.0–0.1)
Basophils Relative: 0 %
Eosinophils Absolute: 0.1 10*3/uL (ref 0.0–0.5)
Eosinophils Relative: 1 %
HCT: 29 % — ABNORMAL LOW (ref 36.0–46.0)
Hemoglobin: 8.7 g/dL — ABNORMAL LOW (ref 12.0–15.0)
Immature Granulocytes: 1 %
Lymphocytes Relative: 12 %
Lymphs Abs: 1.4 10*3/uL (ref 0.7–4.0)
MCH: 22.3 pg — ABNORMAL LOW (ref 26.0–34.0)
MCHC: 30 g/dL (ref 30.0–36.0)
MCV: 74.4 fL — ABNORMAL LOW (ref 80.0–100.0)
Monocytes Absolute: 0.6 10*3/uL (ref 0.1–1.0)
Monocytes Relative: 5 %
Neutro Abs: 9.2 10*3/uL — ABNORMAL HIGH (ref 1.7–7.7)
Neutrophils Relative %: 81 %
Platelets: 268 10*3/uL (ref 150–400)
RBC: 3.9 MIL/uL (ref 3.87–5.11)
RDW: 19 % — ABNORMAL HIGH (ref 11.5–15.5)
WBC: 11.4 10*3/uL — ABNORMAL HIGH (ref 4.0–10.5)
nRBC: 0 % (ref 0.0–0.2)

## 2022-10-19 LAB — IRON AND TIBC
Iron: 17 ug/dL — ABNORMAL LOW (ref 28–170)
Saturation Ratios: 4 % — ABNORMAL LOW (ref 10.4–31.8)
TIBC: 477 ug/dL — ABNORMAL HIGH (ref 250–450)
UIBC: 460 ug/dL

## 2022-10-19 LAB — FERRITIN: Ferritin: 5 ng/mL — ABNORMAL LOW (ref 11–307)

## 2022-10-20 ENCOUNTER — Inpatient Hospital Stay: Payer: PPO | Admitting: Oncology

## 2022-10-20 ENCOUNTER — Inpatient Hospital Stay (HOSPITAL_BASED_OUTPATIENT_CLINIC_OR_DEPARTMENT_OTHER): Payer: PPO | Admitting: Nurse Practitioner

## 2022-10-20 ENCOUNTER — Other Ambulatory Visit: Payer: Self-pay

## 2022-10-20 ENCOUNTER — Encounter: Payer: Self-pay | Admitting: Nurse Practitioner

## 2022-10-20 ENCOUNTER — Telehealth: Payer: Self-pay | Admitting: Internal Medicine

## 2022-10-20 ENCOUNTER — Inpatient Hospital Stay: Payer: PPO

## 2022-10-20 VITALS — BP 139/65 | HR 73 | Temp 99.0°F | Resp 20 | Ht 60.0 in | Wt 203.0 lb

## 2022-10-20 VITALS — BP 151/77 | HR 81

## 2022-10-20 DIAGNOSIS — D509 Iron deficiency anemia, unspecified: Secondary | ICD-10-CM

## 2022-10-20 MED ORDER — SODIUM CHLORIDE 0.9 % IV SOLN
200.0000 mg | Freq: Once | INTRAVENOUS | Status: AC
  Administered 2022-10-20: 200 mg via INTRAVENOUS
  Filled 2022-10-20: qty 200

## 2022-10-20 NOTE — Progress Notes (Signed)
[place]  [phone] [phone]  ID: Alicia [name] OB: August 14, 1946  MR#: [id number]  YJE#:[id number]  Patient Care Team: [name], FNP as PCP - General (Family Medicine)  CHIEF COMPLAINT: Iron deficiency anemia.  INTERVAL HISTORY: Patient returns to clinic today for repeat laboratory work, further evaluation, and consideration of additional IV Venofer. She endorses melena. Her right shoulder no longer hurts. Is undergoing left shoulder surgery in February. She is fatigued, weak. Currently on antibiotics and steroids for URI.   REVIEW OF SYSTEMS:   Review of Systems  Constitutional:  Positive for malaise/fatigue. Negative for fever and weight loss.  Respiratory: Negative.  Negative for cough, hemoptysis and shortness of breath.   Cardiovascular: Negative.  Negative for chest pain and leg swelling.  Gastrointestinal: Negative.  Negative for abdominal pain, blood in stool, constipation, diarrhea, melena and nausea.  Genitourinary: Negative.  Negative for hematuria.  Musculoskeletal:  Positive for joint pain. Negative for back pain.  Skin: Negative.  Negative for rash.  Neurological:  Positive for weakness. Negative for dizziness, focal weakness and headaches.  As per HPI. Otherwise, a complete review of systems is negative.  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   R mid forearm, [facility]    PAST SURGICAL HISTORY: Past Surgical History:  Procedure Laterality Date   APPENDECTOMY     CHOLECYSTECTOMY  10/2016    EYE SURGERY Bilateral 2017   cataracts removed   LUMBAR LAMINECTOMY/DECOMPRESSION MICRODISCECTOMY Left 07/21/2021   Procedure: Laminectomy and Foraminotomy - L4-L5 - left;  Surgeon: Kary Kos, MD;  Location: [facility];  Service: Neurosurgery;  Laterality: Left;  3C   PAROTID GLAND TUMOR EXCISION Left 2003   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   Procedure: PERIPHERAL VASCULAR INTERVENTION;  Surgeon: Waynetta Sandy, MD;  Location: [facility];  Service: Cardiovascular;;  celiac   TOTAL SHOULDER ARTHROPLASTY Right 08/06/2022   Procedure: RIGHT REVERSE SHOULDER ARTHROPLASTY;  Surgeon: Meredith Pel, MD;  Location: [facility];  Service: Orthopedics;  Laterality: Right;   VISCERAL ANGIOGRAPHY N/A 05/18/2022   Procedure: VISCERAL ANGIOGRAPHY;  Surgeon: Waynetta Sandy, MD;  Location: [facility];  Service: Cardiovascular;  Laterality: N/A;    FAMILY HISTORY: Family History  Problem Relation Age of Onset   Heart disease Mother    Cerebral aneurysm Father     ADVANCED DIRECTIVES (Y/N):  N  HEALTH MAINTENANCE: Social History   Tobacco Use   Smoking status: Every Day    Packs/day: 1.50    Years: 31.00    Total pack years: 46.50    Types: Cigarettes    Passive exposure: Current   Smokeless tobacco: Never   Tobacco comments:    Smokes a pack and a half a day [facility] 04/02/22  Vaping Use   Vaping Use: Never used  Substance Use Topics   Alcohol use: Not Currently   Drug use: Never     Colonoscopy:  PAP:  Bone density:  Lipid panel:  Allergies  Allergen Reactions   Ampicillin    Penicillins Other (See Comments)    Yeast infection     Current Outpatient Medications  Medication Sig Dispense Refill   acetaminophen (TYLENOL) 500 MG tablet Take 1,000 mg by mouth every 6 (six) hours as needed for moderate pain.     albuterol (VENTOLIN HFA) 108 (90 Base) MCG/ACT inhaler Inhale 1-2 puffs into the lungs 4 (four) times daily as needed for shortness of  breath or wheezing. 1 each 0   aspirin EC 81 MG tablet Take 1 tablet (81 mg total) by mouth daily. 14 tablet 0   Budeson-Glycopyrrol-Formoterol (BREZTRI AEROSPHERE) 160-9-4.8 MCG/ACT AERO Inhale 2 puffs into the lungs 2 (two) times daily. (Patient taking differently: Inhale 2 puffs into the lungs once.) 10.7 g 11   Cholecalciferol (VITAMIN D3) 125 MCG (5000 UT) CAPS Take 5,000 Units by mouth in the morning.     CVS TRIPLE MAGNESIUM COMPLEX PO Take 1 capsule by mouth at bedtime.     doxycycline (VIBRA-TABS) 100 MG tablet Take 1 tablet (100 mg total) by mouth 2 (two) times daily for 10 days. 20 tablet 0   faricimab-svoa (VABYSMO) 6 MG/0.05ML SOLN intravitreal injection 6 mg by Intravitreal route every 8 (eight) weeks.     gabapentin (NEURONTIN) 300 MG capsule TAKE 1 CAPSULE BY MOUTH THREE TIMES A DAY 270 capsule 1   hydrocortisone 2.5 % cream Apply 1 Application topically daily as needed (rash/irritation). Apply to itchy rash on face qd up to 4 days a week prn flares     Lutein 20 MG TABS Take 20 mg by mouth in the morning.     omeprazole (PRILOSEC) 40 MG capsule TAKE 1 CAPSULE (40 MG TOTAL) BY MOUTH DAILY. 90 capsule 1   pramipexole (MIRAPEX) 0.5 MG tablet TAKE 1 TABLET BY MOUTH TWICE A DAY 180 tablet 1   rosuvastatin (CRESTOR) 10 MG tablet Take 1 tablet (10 mg total) by mouth daily. 30 tablet 11   venlafaxine XR (EFFEXOR XR) 75 MG 24 hr capsule Take 1 capsule (75 mg total) by mouth daily with breakfast. 90 capsule 0   No current facility-administered medications for this visit.    OBJECTIVE: Vitals:   10/20/22 1300  BP: 139/65  Pulse: 73  Resp: 20  Temp: 99 F (37.2 C)     Body mass index is 39.65 kg/m.    ECOG FS:1 - Symptomatic but completely ambulatory  General: Well-developed, well-nourished, no acute distress. Eyes: Pink conjunctiva, anicteric sclera. Lungs: Clear to auscultation bilaterally.  No audible wheezing or coughing Heart: Regular rate and rhythm.  Abdomen: Soft,  nontender, nondistended.  Musculoskeletal: No edema, cyanosis, or clubbing. Neuro: Alert, answering all questions appropriately. Cranial nerves grossly intact. Skin: No rashes or petechiae noted. Psych: Normal affect.   LAB RESULTS:  Lab Results  Component Value Date   NA 140 07/29/2022   K 4.5 07/29/2022   CL 108 07/29/2022   CO2 26 07/29/2022   GLUCOSE 91 07/29/2022   BUN 8 07/29/2022   CREATININE 0.63 07/29/2022   CALCIUM 9.4 07/29/2022   PROT 8.2 01/13/2022   ALBUMIN 4.4 01/13/2022   AST 17 01/13/2022   ALT 10 01/13/2022   ALKPHOS 112 01/13/2022   BILITOT 0.4 01/13/2022   GFRNONAA >60 07/29/2022    Lab Results  Component Value Date   WBC 11.4 (H) 10/19/2022   NEUTROABS 9.2 (H) 10/19/2022   HGB 8.7 (L) 10/19/2022   HCT 29.0 (L) 10/19/2022  MCV 74.4 (L) 10/19/2022   PLT 268 10/19/2022   Lab Results  Component Value Date   IRON 17 (L) 10/19/2022   TIBC 477 (H) 10/19/2022   IRONPCTSAT 4 (L) 10/19/2022   Lab Results  Component Value Date   FERRITIN 5 (L) 10/19/2022     STUDIES: CT SHOULDER LEFT WO CONTRAST  Result Date: 10/01/2022 CLINICAL DATA:  CT left shoulder, preop for shoulder arthroplasty. EXAM: CT OF THE UPPER LEFT EXTREMITY WITHOUT CONTRAST TECHNIQUE: Multidetector CT imaging of the upper left extremity was performed according to the standard protocol. RADIATION DOSE REDUCTION: This exam was performed according to the departmental dose-optimization program which includes automated exposure control, adjustment of the mA and/or kV according to patient size and/or use of iterative reconstruction technique. COMPARISON:  None Available. FINDINGS: Bones/Joint/Cartilage Near complete loss of the articular cartilage with subchondral sclerosis and bulky marginal osteophytes. Subchondral cystic changes of the superior and inferior aspect of the glenoid. Mild acromioclavicular osteoarthritis. No joint effusion. Ligaments Suboptimally assessed by CT. Muscles and  Tendons Rotator cuff muscles are normal in bulk. Deltoid is normal in bulk. No evidence of fatty infiltration. Soft tissues Mild centrilobular and paraseptal emphysematous changes of the partially imaged left lung. No lymphadenopathy. IMPRESSION: 1. Severe glenohumeral osteoarthritis. 2. Mild acromioclavicular osteoarthritis. 3. No evidence of rotator cuff tear. No evidence of rotator cuff muscles or deltoid atrophy. 4. Mild centrilobular and paraseptal emphysematous changes of the partially imaged left lung. Emphysema (ICD10-J43.9). Electronically Signed   By: Keane Police D.O.   On: 10/01/2022 17:03    ASSESSMENT: Iron deficiency anemia.  PLAN:    1.  Iron deficiency anemia: Hemoglobin, ferritin, and iron stores have again decreased. Conssitent with iron deficiency. Plan for venofer x 5 over next 2 weeks. Given that her counts have again dropped, I recommended that she see GI again for follow up. She'll reach out to move appointment up. Will also shorten follow up to follow her more closely.    2.  Constipation/nausea: follow up with GI  3.  Shoulder pain: s/p arthroplasty of right. Awaiting left shoulder.   Disposition: Venofer x 5 over next 2 weeks 2 months- labs (cbc, ferritin, iron studies) Day to week later see me or Dr. Grayland Ormond, +/- venofer- la  Patient expressed understanding and was in agreement with this plan. She also understands that She can call clinic at any time with any questions, concerns, or complaints.   Verlon Au, NP 10/20/2022   CC: Dr. Dayna Barker

## 2022-10-20 NOTE — Telephone Encounter (Signed)
Patient is calling states that she saw her hematologist today and says he is recommending to see doctor Lorenso Courier soon for a procedure. Please advise

## 2022-10-20 NOTE — Patient Instructions (Signed)

## 2022-10-20 NOTE — Telephone Encounter (Signed)
Patient contacted and scheduled.

## 2022-10-23 MED FILL — Iron Sucrose Inj 20 MG/ML (Fe Equiv): INTRAVENOUS | Qty: 10 | Status: AC

## 2022-10-26 ENCOUNTER — Inpatient Hospital Stay: Payer: PPO

## 2022-10-26 VITALS — BP 133/76 | HR 78 | Temp 98.0°F | Resp 18

## 2022-10-26 DIAGNOSIS — D509 Iron deficiency anemia, unspecified: Secondary | ICD-10-CM

## 2022-10-26 MED ORDER — SODIUM CHLORIDE 0.9 % IV SOLN
200.0000 mg | Freq: Once | INTRAVENOUS | Status: AC
  Administered 2022-10-26: 200 mg via INTRAVENOUS
  Filled 2022-10-26: qty 200

## 2022-10-26 MED ORDER — SODIUM CHLORIDE 0.9 % IV SOLN
INTRAVENOUS | Status: DC
  Filled 2022-10-26: qty 250

## 2022-10-26 NOTE — Patient Instructions (Signed)
[facility] AT [place]  Discharge Instructions: Thank you for choosing [facility] to provide your oncology and hematology care.  If you have a lab appointment with the [facility], please go directly to the [facility] and check in at the registration area.  Wear comfortable clothing and clothing appropriate for easy access to any Portacath or PICC line.   We strive to give you quality time with your provider. You may need to reschedule your appointment if you arrive late (15 or more minutes).  Arriving late affects you and other patients whose appointments are after yours.  Also, if you miss three or more appointments without notifying the office, you may be dismissed from the clinic at the provider's discretion.      For prescription refill requests, have your pharmacy contact our office and allow 72 hours for refills to be completed.    Today you received the following chemotherapy and/or immunotherapy agents Venofer      To help prevent nausea and vomiting after your treatment, we encourage you to take your nausea medication as directed.  BELOW ARE SYMPTOMS THAT SHOULD BE REPORTED IMMEDIATELY: *FEVER GREATER THAN 100.4 F (38 C) OR HIGHER *CHILLS OR SWEATING *NAUSEA AND VOMITING THAT IS NOT CONTROLLED WITH YOUR NAUSEA MEDICATION *UNUSUAL SHORTNESS OF BREATH *UNUSUAL BRUISING OR BLEEDING *URINARY PROBLEMS (pain or burning when urinating, or frequent urination) *BOWEL PROBLEMS (unusual diarrhea, constipation, pain near the anus) TENDERNESS IN MOUTH AND THROAT WITH OR WITHOUT PRESENCE OF ULCERS (sore throat, sores in mouth, or a toothache) UNUSUAL RASH, SWELLING OR PAIN  UNUSUAL VAGINAL DISCHARGE OR ITCHING   Items with * indicate a potential emergency and should be followed up as soon as possible or go to the Emergency Department if any problems should occur.  Please show the CHEMOTHERAPY ALERT CARD or IMMUNOTHERAPY ALERT CARD at check-in to the  Emergency Department and triage nurse.  Should you have questions after your visit or need to cancel or reschedule your appointment, please contact [facility] [place] AT [place]  [phone] and follow the prompts.  Office hours are 8:00 a.m. to 4:30 p.m. Monday - Friday. Please note that voicemails left after 4:00 p.m. may not be returned until the following business day.  We are closed weekends and major holidays. You have access to a nurse at all times for urgent questions. Please call the main number to the clinic [phone] and follow the prompts.  For any non-urgent questions, you may also contact your provider using MyChart. We now offer e-Visits for anyone 11 and older to request care online for non-urgent symptoms. For details visit [email or web address].   Also download the MyChart app! Go to the app store, search "MyChart", open the app, select [facility], and log in with your MyChart username and password.  Masks are optional in the cancer centers. If you would like for your care team to wear a mask while they are taking care of you, please let them know. For doctor visits, patients may have with them one support person who is at least 77 years old. At this time, visitors are not allowed in the infusion area.

## 2022-10-30 ENCOUNTER — Inpatient Hospital Stay: Payer: PPO

## 2022-10-30 VITALS — BP 123/61 | HR 79 | Temp 98.0°F | Resp 18

## 2022-10-30 DIAGNOSIS — D509 Iron deficiency anemia, unspecified: Secondary | ICD-10-CM

## 2022-10-30 MED ORDER — SODIUM CHLORIDE 0.9 % IV SOLN
Freq: Once | INTRAVENOUS | Status: AC
  Filled 2022-10-30: qty 250

## 2022-10-30 MED ORDER — SODIUM CHLORIDE 0.9 % IV SOLN
200.0000 mg | Freq: Once | INTRAVENOUS | Status: AC
  Administered 2022-10-30: 200 mg via INTRAVENOUS
  Filled 2022-10-30: qty 200

## 2022-11-04 ENCOUNTER — Inpatient Hospital Stay: Payer: PPO

## 2022-11-04 VITALS — BP 144/70 | HR 82 | Temp 96.8°F | Resp 18

## 2022-11-04 DIAGNOSIS — D509 Iron deficiency anemia, unspecified: Secondary | ICD-10-CM

## 2022-11-04 MED ORDER — SODIUM CHLORIDE 0.9 % IV SOLN
INTRAVENOUS | Status: DC | PRN
  Filled 2022-11-04: qty 250

## 2022-11-04 MED ORDER — SODIUM CHLORIDE 0.9 % IV SOLN
200.0000 mg | Freq: Once | INTRAVENOUS | Status: AC
  Administered 2022-11-04: 200 mg via INTRAVENOUS
  Filled 2022-11-04: qty 200

## 2022-11-04 NOTE — Progress Notes (Signed)
Patient declined to wait the 30 minutes post infusion observation. Patient educated and verbalized understanding.

## 2022-11-05 MED FILL — Iron Sucrose Inj 20 MG/ML (Fe Equiv): INTRAVENOUS | Qty: 10 | Status: AC

## 2022-11-06 ENCOUNTER — Inpatient Hospital Stay: Payer: PPO

## 2022-11-06 VITALS — BP 142/68 | HR 78 | Resp 16

## 2022-11-06 DIAGNOSIS — D509 Iron deficiency anemia, unspecified: Secondary | ICD-10-CM

## 2022-11-06 MED ORDER — SODIUM CHLORIDE 0.9 % IV SOLN
INTRAVENOUS | Status: DC
  Filled 2022-11-06: qty 250

## 2022-11-06 MED ORDER — SODIUM CHLORIDE 0.9 % IV SOLN
200.0000 mg | Freq: Once | INTRAVENOUS | Status: AC
  Administered 2022-11-06: 200 mg via INTRAVENOUS
  Filled 2022-11-06: qty 200

## 2022-11-06 NOTE — Patient Instructions (Signed)

## 2022-11-10 ENCOUNTER — Inpatient Hospital Stay: Payer: PPO

## 2022-11-12 ENCOUNTER — Inpatient Hospital Stay: Payer: PPO | Attending: Oncology

## 2022-11-12 ENCOUNTER — Ambulatory Visit (INDEPENDENT_AMBULATORY_CARE_PROVIDER_SITE_OTHER): Payer: PPO | Admitting: Family Medicine

## 2022-11-12 ENCOUNTER — Encounter: Payer: Self-pay | Admitting: Family Medicine

## 2022-11-12 VITALS — BP 122/64 | HR 85 | Temp 97.6°F | Ht 60.0 in | Wt 194.2 lb

## 2022-11-12 VITALS — BP 133/64 | HR 78 | Temp 98.8°F | Resp 16

## 2022-11-12 DIAGNOSIS — H9202 Otalgia, left ear: Secondary | ICD-10-CM

## 2022-11-12 DIAGNOSIS — D509 Iron deficiency anemia, unspecified: Secondary | ICD-10-CM | POA: Insufficient documentation

## 2022-11-12 DIAGNOSIS — G8929 Other chronic pain: Secondary | ICD-10-CM

## 2022-11-12 MED ORDER — SODIUM CHLORIDE 0.9 % IV SOLN
INTRAVENOUS | Status: DC
  Filled 2022-11-12: qty 250

## 2022-11-12 MED ORDER — SODIUM CHLORIDE 0.9 % IV SOLN
200.0000 mg | Freq: Once | INTRAVENOUS | Status: AC
  Administered 2022-11-12: 200 mg via INTRAVENOUS
  Filled 2022-11-12: qty 200

## 2022-11-12 MED ORDER — NEOMYCIN-POLYMYXIN-HC 3.5-10000-1 OT SOLN: 4.0000 [drp] | Freq: Four times a day (QID) | OTIC | 0 refills | Status: DC

## 2022-11-12 NOTE — Progress Notes (Signed)
Bradley Bostelman T. Makennah Omura, MD, [facility] at [facility] [place], [address]  Phone: [phone]  FAX: [phone]  Alicia [name] - 77 y.o. female  MRN [id number]  Date of Birth: January 07, 1946  Date: 11/12/2022  PCP: [name], FNP  Referral: [name], FNP  Chief Complaint  Patient presents with   Ear Pain    C/o L ear pain. Started about 6 mos ago. Seen by ENT but recently retired. Requests new referral.   Subjective:   Alicia [name] is a 77 y.o. very pleasant female patient with Body mass index is 37.94 kg/m. who presents with the following:  77 year old patient presents with ear pain.  2 years ago went to ENT in [place] -  She had a benign parotid tumor in 2003. At times she feels like there is something crawling in her ear.  Has had an earache off and on for about two years.   Went to [place] in August - and felt like it was red for coughing and with now a near constant earache.  Sweet oil will help some with the pain except at night.   She is basically having left-sided ear pain most of the time.  Right now it is quite severe.  Review of Systems is noted in the HPI, as appropriate  Objective:   BP 122/64   Pulse 85   Temp 97.6 F (36.4 C) (Temporal)   Ht 5' (1.524 m)   Wt 194 lb 4 oz (88.1 kg)   SpO2 94%   BMI 37.94 kg/m   GEN: No acute distress; alert,appropriate. PULM: Breathing comfortably in no respiratory distress PSYCH: Normally interactive.  Right TM is clear. Left TM is somewhat obscured with a scarred appearance.  The ear canal is somewhat swollen in appearance and she has tenderness with palpation of the pinna and tragus.  Laboratory and Imaging Data:  Assessment and Plan:     ICD-10-CM   1. Chronic ear pain, left  H92.02 Ambulatory referral to ENT   G89.29      Acute on chronic with exacerbation: Chronic ear pain of unclear origin.  I think it is reasonable  for her to revisit this with ENT given it has been 2 years.  Ear exam does not appear entirely normal and it is tender to palpation.  This suggests a otitis externa type process at least acutely.  I am going to place her on some Cortisporin.  Medication Management during today's office visit: Meds ordered this encounter  Medications   neomycin-polymyxin-hydrocortisone (CORTISPORIN) OTIC solution    Sig: Place 4 drops into the left ear 4 (four) times daily. For 1 week    Dispense:  10 mL    Refill:  0   There are no discontinued medications.  Orders placed today for conditions managed today: Orders Placed This Encounter  Procedures   Ambulatory referral to ENT    Disposition: No follow-ups on file.  Dragon [employer] One speech-to-text software was used for transcription in this dictation.  Possible transcriptional errors can occur using Editor, commissioning.   Signed,  Maud Deed. Jerriyah Louis, MD   Outpatient Encounter Medications as of 11/12/2022  Medication Sig   acetaminophen (TYLENOL) 500 MG tablet Take 1,000 mg by mouth every 6 (six) hours as needed for moderate pain.   albuterol (VENTOLIN HFA) 108 (90 Base) MCG/ACT inhaler Inhale 1-2 puffs into the lungs 4 (four) times daily as needed for shortness of breath  or wheezing.   aspirin EC 81 MG tablet Take 1 tablet (81 mg total) by mouth daily.   Budeson-Glycopyrrol-Formoterol (BREZTRI AEROSPHERE) 160-9-4.8 MCG/ACT AERO Inhale 2 puffs into the lungs 2 (two) times daily. (Patient taking differently: Inhale 2 puffs into the lungs once.)   Cholecalciferol (VITAMIN D3) 125 MCG (5000 UT) CAPS Take 5,000 Units by mouth in the morning.   CVS TRIPLE MAGNESIUM COMPLEX PO Take 1 capsule by mouth at bedtime.   faricimab-svoa (VABYSMO) 6 MG/0.05ML SOLN intravitreal injection 6 mg by Intravitreal route every 8 (eight) weeks.   gabapentin (NEURONTIN) 300 MG capsule TAKE 1 CAPSULE BY MOUTH THREE TIMES A DAY   hydrocortisone 2.5 % cream Apply 1 Application  topically daily as needed (rash/irritation). Apply to itchy rash on face qd up to 4 days a week prn flares   Lutein 20 MG TABS Take 20 mg by mouth in the morning.   neomycin-polymyxin b-dexamethasone (MAXITROL) 3.5-10000-0.1 OINT SMARTSIG:Sparingly In Eye(s) 4 Times Daily   neomycin-polymyxin-hydrocortisone (CORTISPORIN) OTIC solution Place 4 drops into the left ear 4 (four) times daily. For 1 week   omeprazole (PRILOSEC) 40 MG capsule TAKE 1 CAPSULE (40 MG TOTAL) BY MOUTH DAILY.   pramipexole (MIRAPEX) 0.5 MG tablet TAKE 1 TABLET BY MOUTH TWICE A DAY   rosuvastatin (CRESTOR) 10 MG tablet Take 1 tablet (10 mg total) by mouth daily.   venlafaxine XR (EFFEXOR XR) 75 MG 24 hr capsule Take 1 capsule (75 mg total) by mouth daily with breakfast.   [DISCONTINUED] 0.9 %  sodium chloride infusion    No facility-administered encounter medications on file as of 11/12/2022.

## 2022-11-16 ENCOUNTER — Encounter: Payer: Self-pay | Admitting: *Deleted

## 2022-11-16 ENCOUNTER — Ambulatory Visit (INDEPENDENT_AMBULATORY_CARE_PROVIDER_SITE_OTHER): Payer: PPO | Admitting: Internal Medicine

## 2022-11-16 ENCOUNTER — Encounter: Payer: Self-pay | Admitting: Internal Medicine

## 2022-11-16 DIAGNOSIS — D509 Iron deficiency anemia, unspecified: Secondary | ICD-10-CM

## 2022-11-16 DIAGNOSIS — K5900 Constipation, unspecified: Secondary | ICD-10-CM

## 2022-11-16 DIAGNOSIS — K5520 Angiodysplasia of colon without hemorrhage: Secondary | ICD-10-CM

## 2022-11-16 NOTE — Patient Instructions (Addendum)
If you are age 78 or older, your body mass index should be between 23-30. Your Body mass index is 37.52 kg/m. If this is out of the aforementioned range listed, please consider follow up with your Primary Care Provider. ________________________________________________________  The [facility] providers would like to encourage you to use [facility] to communicate with providers for non-urgent requests or questions.  Due to long hold times on the telephone, sending your provider a message by [facility] may be a faster and more efficient way to get a response.  Please allow 48 business hours for a response.  Please remember that this is for non-urgent requests.  _______________________________________________________  Dennis Bast have been scheduled for an small bowel enteroscopy at [facility].   Please follow written instructions given to you at your visit today. If you use inhalers (even only as needed), please bring them with you on the day of your procedure.  Please purchase the following medications over the counter and take as directed:  Fiber supplement such as Benefiber- use as directed daily Miralax: Take as directed up to 3 times a day to achieve regular bowel movements  Due to recent changes in healthcare laws, you may see the results of your imaging and laboratory studies on MyChart before your provider has had a chance to review them.  We understand that in some cases there may be results that are confusing or concerning to you. Not all laboratory results come back in the same time frame and the provider may be waiting for multiple results in order to interpret others.  Please give [place] 48 hours in order for your provider to thoroughly review all the results before contacting the office for clarification of your results.   Thank you for entrusting me with your care and choosing [facility].  Dr Lorenso Courier

## 2022-11-16 NOTE — Progress Notes (Signed)
Chief Complaint: IDA  HPI : 77 year old female with history of COPD, hiatal hernia, OSA presents for follow up of IDA  Interval History: She does still see black stools. The stools stick to the side of the commode. Her bowels do not move unless she has an excessive amount of gas. She does feel like she does not clear herself out fully. She has fecal incontinence on occasion because she passes gas and ends up passing stool as well. She denies taking oral iron supplements because she is not able to tolerate them. She has been doing her IV iron infusions with hematology. She has on average 2-3 BMs per day. She is currently taking omeprazole 40 mg QD. Denies nausea. She is now off of Plavix therapy. Denies NSAID use.  Wt Readings from Last 3 Encounters:  11/16/22 192 lb 2 oz (87.1 kg)  11/12/22 194 lb 4 oz (88.1 kg)  10/20/22 203 lb (92.1 kg)   Current Outpatient Medications  Medication Sig Dispense Refill   acetaminophen (TYLENOL) 500 MG tablet Take 1,000 mg by mouth every 6 (six) hours as needed for moderate pain.     albuterol (VENTOLIN HFA) 108 (90 Base) MCG/ACT inhaler Inhale 1-2 puffs into the lungs 4 (four) times daily as needed for shortness of breath or wheezing. 1 each 0   aspirin EC 81 MG tablet Take 1 tablet (81 mg total) by mouth daily. 14 tablet 0   Budeson-Glycopyrrol-Formoterol (BREZTRI AEROSPHERE) 160-9-4.8 MCG/ACT AERO Inhale 2 puffs into the lungs 2 (two) times daily. (Patient taking differently: Inhale 2 puffs into the lungs once.) 10.7 g 11   Cholecalciferol (VITAMIN D3) 125 MCG (5000 UT) CAPS Take 5,000 Units by mouth in the morning.     [employer] TRIPLE MAGNESIUM COMPLEX PO Take 1 capsule by mouth at bedtime.     faricimab-svoa (VABYSMO) 6 MG/0.05ML SOLN intravitreal injection 6 mg by Intravitreal route every 8 (eight) weeks.     gabapentin (NEURONTIN) 300 MG capsule TAKE 1 CAPSULE BY MOUTH THREE TIMES A DAY 270 capsule 1   hydrocortisone 2.5 % cream Apply 1 Application  topically daily as needed (rash/irritation). Apply to itchy rash on face qd up to 4 days a week prn flares     Lutein 20 MG TABS Take 20 mg by mouth in the morning.     omeprazole (PRILOSEC) 40 MG capsule TAKE 1 CAPSULE (40 MG TOTAL) BY MOUTH DAILY. 90 capsule 1   pramipexole (MIRAPEX) 0.5 MG tablet TAKE 1 TABLET BY MOUTH TWICE A DAY 180 tablet 1   rosuvastatin (CRESTOR) 10 MG tablet Take 1 tablet (10 mg total) by mouth daily. 30 tablet 11   venlafaxine XR (EFFEXOR XR) 75 MG 24 hr capsule Take 1 capsule (75 mg total) by mouth daily with breakfast. 90 capsule 0   neomycin-polymyxin b-dexamethasone (MAXITROL) 3.5-10000-0.1 OINT SMARTSIG:Sparingly In Eye(s) 4 Times Daily (Patient not taking: Reported on 11/16/2022)     neomycin-polymyxin-hydrocortisone (CORTISPORIN) OTIC solution Place 4 drops into the left ear 4 (four) times daily. For 1 week (Patient not taking: Reported on 11/16/2022) 10 mL 0   No current facility-administered medications for this visit.   Review of Systems: All systems reviewed and negative except where noted in HPI.   Physical Exam: BP 110/70   Pulse 90   Ht 5' (1.524 m)   Wt 192 lb 2 oz (87.1 kg)   BMI 37.52 kg/m  Constitutional: Pleasant,well-developed, female in no acute distress. HEENT: Normocephalic and atraumatic. Conjunctivae are  normal. No scleral icterus. Cardiovascular: Normal rate, regular rhythm.  Pulmonary/chest: Effort normal and breath sounds normal. No wheezing, rales or rhonchi. Abdominal: Soft, nondistended, nontender. Bowel sounds active throughout. There are no masses palpable. No hepatomegaly. Extremities: No edema Neurological: Alert and oriented to person place and time. Skin: Skin is warm and dry. No rashes noted. Psychiatric: Normal mood and affect. Behavior is normal.  Labs 01/2022: Positive hemoccult. Low ferritin of 4.8. CBC with low Hb of 8.1. CMP nml.  Labs 02/2022: C dif negative. Giardia negative. TSH nml. CBC with low Hb of 11. Lipase low  at 8. Amylase low at 24. H pylori IgG negative. Alpha gal panel negative.   Labs 04/2022: CBC with low Hb of 11.2. Low ferritin of 5  Abdominal U/S 02/13/22: IMPRESSION: 1. Unremarkable examination of the liver and spleen. Status post cholecystectomy. 2.  No evidence of nephrolithiasis or hydronephrosis. 3.  Pancreas not clearly visualized due to bowel gas shadowing. 4. 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  CTA A/P w/contrast 03/05/22: IMPRESSION: VASCULAR 1. 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 2. 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 3. Moderate to severe stenosis at the origin of the superior mesenteric artery. NON-VASCULAR 1. Mild distal colonic diverticulosis. 2. Multiple chronic fracture deformities of the pelvis  Peripheral vascular catheterization 05/09/22: Findings: Celiac artery had a very tight 90% stenosis at the takeoff there was a very large GDA that seem to fill the SMA very briskly distally.  I elected to stent the celiac as the SMA is known to have long segment occlusion/stenosis.  At completion there was 0% residual stenosis.  EGD in 04/17/21 that showed irregular Z-line that was biopsied, distal and proximal esophageal biopsies taken for EoE, gastritis that was biopsied, small hiatal hernia.  Colonoscopy 05/01/21: Moderate diverticulosis in the left side of the colon. 3 mm polyp found and removed. Internal hemorrhoids. Path: Hyperplastic polyp  EGD 05/08/22: - White nummular lesions in esophageal mucosa. Biopsied. - Small hiatal hernia. - Multiple gastric polyps. Biopsied. - Gastritis. Biopsied. 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 - Biopsies were taken with a cold forceps for histology in the entire duodenum. Path: 1. Surgical [P], duodenal - DUODENAL MUCOSA WITH NO SPECIFIC  HISTOPATHOLOGIC CHANGES - NEGATIVE FOR INCREASED INTRAEPITHELIAL LYMPHOCYTES OR VILLOUS ARCHITECTURAL CHANGES 2. Surgical [P], gastric - GASTRIC ANTRAL MUCOSA WITH NONSPECIFIC REACTIVE GASTROPATHY - GASTRIC OXYNTIC MUCOSA WITH PARIETAL CELL HYPERPLASIA AS CAN BE SEEN IN HYPERGASTRINEMIC STATES SUCH AS PPI THERAPY. - HELICOBACTER PYLORI-LIKE ORGANISMS ARE NOT IDENTIFIED ON ROUTINE H&E STAIN 3. Surgical [P], gastric polyp - FUNDIC GLAND POLYP(S) - NEGATIVE FOR DYSPLASIA 4. Surgical [P], esophagus - ESOPHAGEAL SQUAMOUS MUCOSA WITH NO SPECIFIC HISTOPATHOLOGIC CHANGES - NEGATIVE FOR INCREASED INTRAEPITHELIAL EOSINOPHILS  Colonoscopy 05/08/22: - Three 3 to 4 mm polyps in the transverse colon, removed with a cold snare. Resected and retrieved. - Diverticulosis in the sigmoid colon. - Non-bleeding internal hemorrhoids. - Biopsies were taken with a cold forceps from the entire colon for evaluation of microscopic colitis. Path: 5. Surgical [P], colon, transverse, polyp (3) - TUBULAR ADENOMA(S) WITHOUT HIGH-GRADE DYSPLASIA OR MALIGNANCY - HYPERPLASTIC POLYP 6. Surgical [P], colon nos, random sites - COLONIC MUCOSA WITH NO SPECIFIC HISTOPATHOLOGIC CHANGES - NEGATIVE FOR ACUTE INFLAMMATION, INCREASED INTRAEPITHELIAL LYMPHOCYTES OR THICKENED SUBEPITHELIAL COLLAGEN TABLE  ASSESSMENT AND PLAN: IDA Melena Constipation Fecal incontinence Duodenal  angioectasias History of colon polyps History of mesenteric ischemia s/p stenting Patient has still has issues with IDA and melena. She has been getting IV iron infusions but her blood counts still remain stably low. It is suspected that she may be losing blood from small bowel angiectasias. Will plan for further evaluation with SBE in the hospital to perform APC of the small bowel angioectasias seen on her last EGD. For her constipation and fecal incontinence will have her start daily Miralax and daily fiber supplement. - Previously gave low FODMAP diet -  Start daily fiber supplement - Start Miralax QD - Cont PPI QD - Will plan for SBE in hospital for treatment - Next colonoscopy due in 04/2029  Christia Reading, MD  I spent 43 minutes of time, including in depth chart review, independent review of results as outlined above, communicating results with the patient directly, face-to-face time with the patient, coordinating care, ordering studies and medications as appropriate, and documentation.

## 2022-12-08 ENCOUNTER — Telehealth: Payer: Self-pay | Admitting: Orthopedic Surgery

## 2022-12-08 NOTE — Telephone Encounter (Signed)
Called patient to discuss future surgery date for left reverse shoulder arthroplasty.  Patient is receiving iron infusions at this time and has an upcoming procedure in earlier February.   She will reach out of our office after the procedure or when she is ready to see Dr. Marlou Sa or schedule her shoulder surgery.

## 2022-12-10 ENCOUNTER — Encounter (HOSPITAL_COMMUNITY): Payer: Self-pay | Admitting: Internal Medicine

## 2022-12-10 NOTE — Progress Notes (Signed)
Attempted to obtain medical history via telephone, unable to reach at this time. HIPAA compliant voicemail message left requesting return call to pre surgical testing department.  

## 2022-12-11 DIAGNOSIS — G4733 Obstructive sleep apnea (adult) (pediatric): Secondary | ICD-10-CM | POA: Diagnosis not present

## 2022-12-15 ENCOUNTER — Encounter: Payer: Self-pay | Admitting: Family

## 2022-12-16 ENCOUNTER — Ambulatory Visit
Admission: EM | Admit: 2022-12-16 | Discharge: 2022-12-16 | Disposition: A | Discharge status: Home / Self Care | Payer: PPO | Attending: Urgent Care | Admitting: Urgent Care

## 2022-12-16 ENCOUNTER — Encounter: Payer: Self-pay | Admitting: Emergency Medicine

## 2022-12-16 ENCOUNTER — Other Ambulatory Visit: Payer: Self-pay

## 2022-12-16 DIAGNOSIS — J019 Acute sinusitis, unspecified: Secondary | ICD-10-CM

## 2022-12-16 DIAGNOSIS — B3731 Acute candidiasis of vulva and vagina: Secondary | ICD-10-CM | POA: Diagnosis not present

## 2022-12-16 MED ORDER — PREDNISONE 20 MG PO TABS: ORAL_TABLET | ORAL | 0 refills | Status: AC

## 2022-12-16 MED ORDER — FLUCONAZOLE 150 MG PO TABS: 150.0000 mg | ORAL_TABLET | Freq: Once | ORAL | 0 refills | Status: AC

## 2022-12-16 MED ORDER — AMOXICILLIN-POT CLAVULANATE 875-125 MG PO TABS: 1.0000 | ORAL_TABLET | Freq: Two times a day (BID) | ORAL | 0 refills | Status: DC

## 2022-12-16 NOTE — ED Triage Notes (Signed)
Patient reports left ear pain, stuffiness since before christmas 2023.  Saw dr Edilia Bo in January.  Started an ear drop with steroid in it.  Patient thinks it is worse after medicine.    Patient reports left lower jaw is swollen, reports soreness and numbness around left ear.  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  ENT appt is scheduled for 3/13

## 2022-12-16 NOTE — Telephone Encounter (Signed)
Unable to reach pt by phone; left v/m for pt to call [place]. Will also my chart pt as well.

## 2022-12-16 NOTE — Telephone Encounter (Signed)
This needs more urgent evaluation. See if we can get her in today. Please triage for swelling in face.

## 2022-12-16 NOTE — ED Provider Notes (Signed)
Alicia [name]    CSN: [id number] Arrival date & time: 12/16/22  1322      History   Chief Complaint Chief Complaint  Patient presents with   ear fullness    HPI [name] is a 77 y.o. female.   HPI  Presents to UC with c/o feeling of L ear fullness since Christmas. She reports being seen by her PCP and treated with cortisporin. Symptoms worse since starting this treatment.  PMH includes hx of COPD (centrilobular emphysema) and chronic cough.  Reports pain since 6 months and evaluated by ENT who retired. New appt scheduled for March.  Presents today after communicating with her PCP office yesterday and informing them that she feels "outer L ear is swollen and swelling is moving across face toward nose". She denies swelling at L eye and denies swelling in mouth, tongue, neck, lips, difficulty breathing.  No drainage from the ear and reports decreased hearing from the L ear.  Pt has endoscopy planned for tomorrow with Alicia [name].  Patient states she was treated for sinus infection back in October with doxycycline and prednisone. She reports current "tons of" sinus drainage.  Reports intolerance of high dosage of amoxicillin (800 mg) gives her yeast infection.  Has tolerated lower doses of amoxicillin without difficulty in the past.  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 06/16/2022   Seborrheic dermatitis 06/16/2022   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   Centrilobular emphysema ([place]) 02/13/2022   Cigarette smoker 02/06/2022   Asthmatic bronchitis , chronic 02/05/2022   Incidental pulmonary nodule, greater than or equal to 67m 01/21/2022   Bilateral carotid artery stenosis 12/26/2021   Iron deficiency anemia 12/26/2021   Mixed hyperlipidemia 12/26/2021   Chronic cough 12/26/2021   Acute bronchitis with COPD ([facility] 12/26/2021   Adjustment reaction with anxiety and depression 12/26/2021   Chronic venous insufficiency 02/29/2020   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/05/2019   Arthritis of shoulder 06/08/2018   GERD (gastroesophageal reflux disease) 06/08/2018   Macular degeneration 06/08/2018   Sleep apnea 06/08/2018    Past Surgical History:  Procedure Laterality Date   APPENDECTOMY     CELIAC ARTERY STENT     CHOLECYSTECTOMY  10/2016   EYE SURGERY Bilateral 2017   cataracts removed   LUMBAR LAMINECTOMY/DECOMPRESSION MICRODISCECTOMY Left 07/21/2021   Procedure: Laminectomy and Foraminotomy - L4-L5 - left;  Surgeon: CKary Kos MD;  [facility]  Service: Neurosurgery;  Laterality: Left;  3C   PAROTID GLAND TUMOR EXCISION Left 2003   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   Procedure: PERIPHERAL VASCULAR INTERVENTION;  Surgeon: CWaynetta Sandy MD;  [facility];  Service: Cardiovascular;;  celiac   TOTAL SHOULDER ARTHROPLASTY Right 08/06/2022   Procedure:  RIGHT REVERSE SHOULDER ARTHROPLASTY;  Surgeon: Meredith Pel, MD;  Location: [facility];  Service: Orthopedics;  Laterality: Right;   VISCERAL ANGIOGRAPHY N/A 05/18/2022   Procedure: VISCERAL ANGIOGRAPHY;  Surgeon: Waynetta Sandy, MD;  Location: [facility];  Service: Cardiovascular;  Laterality: N/A;    OB History   No obstetric history on file.      Home Medications    Prior to Admission  medications   Medication Sig Start Date End Date Taking? Authorizing Provider  acetaminophen (TYLENOL) 500 MG tablet Take 1,000 mg by mouth every 6 (six) hours as needed for moderate pain.    [provider]  albuterol (VENTOLIN HFA) 108 (90 Base) MCG/ACT inhaler Inhale 1-2 puffs into the lungs 4 (four) times daily as needed for shortness of breath or wheezing. 07/30/22   [name], FNP  aspirin EC 81 MG tablet Take 1 tablet (81 mg total) by mouth daily. Patient taking differently: Take 81 mg by mouth every other day. 08/07/22   Alicia, [name], PA-C  Budeson-Glycopyrrol-Formoterol (BREZTRI AEROSPHERE) 160-9-4.8 MCG/ACT AERO Inhale 2 puffs into the lungs 2 (two) times daily. 02/05/22   Tanda Rockers, MD  Cholecalciferol (VITAMIN D3) 125 MCG (5000 UT) CAPS Take 5,000 Units by mouth in the morning.    [provider]  faricimab-svoa (VABYSMO) 6 MG/0.05ML SOLN intravitreal injection 6 mg by Intravitreal route every 8 (eight) weeks.    [provider]  gabapentin (NEURONTIN) 300 MG capsule TAKE 1 CAPSULE BY MOUTH THREE TIMES A DAY 09/04/22   Alicia, Alicia L, PA-C  hydrocortisone 2.5 % cream Apply 1 Application topically daily as needed (rash/irritation). Apply to itchy rash on face qd up to 4 days a week prn flares 06/16/22   [name], FNP  Lutein 20 MG TABS Take 20 mg by mouth in the morning.    [provider]  Magnesium 400 MG TABS Take 400 mg by mouth daily.    [provider]  neomycin-polymyxin-hydrocortisone (CORTISPORIN) OTIC solution Place 4 drops into the left ear 4 (four) times daily. For 1 week Patient taking differently: Place 4 drops into the left ear daily. 11/12/22   Copland, Frederico Hamman, MD  omeprazole (PRILOSEC) 40 MG capsule TAKE 1 CAPSULE (40 MG TOTAL) BY MOUTH DAILY. 08/27/22   Sharyn Creamer, MD  pramipexole (MIRAPEX) 0.5 MG tablet TAKE 1 TABLET BY MOUTH TWICE A DAY 09/04/22   [name], FNP  rosuvastatin (CRESTOR) 10 MG  tablet Take 1 tablet (10 mg total) by mouth daily. 05/18/22 05/18/23  Waynetta Sandy, MD  venlafaxine XR (EFFEXOR XR) 75 MG 24 hr capsule Take 1 capsule (75 mg total) by mouth daily with breakfast. 10/16/22   [name], FNP    Family History Family History  Problem Relation Age of Onset   Heart disease Mother    Cerebral aneurysm Father     Social History Social History   Tobacco Use   Smoking status: Every Day    Packs/day: 1.50    Years: 31.00    Total pack years: 46.50    Types: Cigarettes    Passive exposure: Current   Smokeless tobacco: Never   Tobacco comments:    Smokes a pack and a half a day [facility] 04/02/22  Vaping Use   Vaping Use: Never used  Substance Use Topics   Alcohol use: Not Currently   Drug use: Never     Allergies   Amoxicillin   Review of Systems Review of  Systems   Physical Exam Triage Vital Signs ED Triage Vitals  Enc Vitals Group     BP      Pulse      Resp      Temp      Temp src      SpO2      Weight      Height      Head Circumference      Peak Flow      Pain Score      Pain Loc      Pain Edu?      Excl. in [place]?    No data found.  Updated Vital Signs There were no vitals taken for this visit.  Visual Acuity Right Eye Distance:   Left Eye Distance:   Bilateral Distance:    Right Eye Near:   Left Eye Near:    Bilateral Near:     Physical Exam Vitals reviewed.  Constitutional:      Appearance: Normal appearance.  HENT:     Left Ear: A middle ear effusion is present. There is no impacted cerumen. Tympanic membrane is not erythematous.  Cardiovascular:     Rate and Rhythm: Normal rate and regular rhythm.     Pulses: Normal pulses.     Heart sounds: Normal heart sounds.  Pulmonary:     Effort: Pulmonary effort is normal.     Breath sounds: Normal breath sounds.  Neurological:     Mental Status: She is alert.      UC Treatments / Results  Labs (all labs ordered are listed, but only abnormal  results are displayed) Labs Reviewed - No data to display  EKG   Radiology No results found.  Procedures Procedures (including critical care time)  Medications Ordered in UC Medications - No data to display  Initial Impression / Assessment and Plan / UC Course  I have reviewed the triage vital signs and the nursing notes.  Pertinent labs & imaging results that were available during my care of the patient were reviewed by me and considered in my medical decision making (see chart for details).   Patient is afebrile here without recent antipyretics. Satting well on room air. Overall is well appearing, well hydrated, without respiratory distress. Pulmonary exam is unremarkable.  Lungs CTAB without wheezing, rhonchi, rales.  Left TM is not erythematous.  There is middle ear effusion visible through the TM.  There is no EAC abnormalities.  No edema or erythema is noted.  I suspect sinusitis as the cause of her symptoms, possibly left eustachian tube disorder.  Given her ongoing sinus drainage, possibly bacterial infection given the duration of symptoms.  Will treat with Augmentin 875 along with a course of prednisone.  Discussed possible side effects of prednisone which the patient is familiar with from previous courses.  Will also give her Diflucan in the event she develops a yeast infection as a result of antibiotic use.  Final Clinical Impressions(s) / UC Diagnoses   Final diagnoses:  None   Discharge Instructions   None    ED Prescriptions   None    PDMP not reviewed this encounter.   Rose Phi, [place] 12/16/22 [phone]

## 2022-12-16 NOTE — Telephone Encounter (Signed)
I spoke with pt; pt said has had earache since Christmas and starting 12/12/22 pts outer lt ear is swollen and swelling is moving across face toward nose.no swelling at lt eye and no swelling in mouth, tongue, throat, neck or lips. Pt is not having any difficulty in breathing. Pt said no drainage from ear. Pt said has consistent dull ache in lt ear and due to swelling has decreased hearing in lt ear. Pt cannot get appt with ENT until 01/20/23.pt is not sure if fever or not. Pt said is out of sweet oil which pt put in lt ear and that did help pt sleep at night pt said needs to be seen today because has procedure at hospital on 12/17/22. No available appts at [facility] or [facility]. I scheduled pt an appt with [facility] with UC & ED precautions and pt voiced understanding and appreciative. Sending note to Red Christians FNP.

## 2022-12-16 NOTE — Telephone Encounter (Signed)
Left message to return call to our office.  

## 2022-12-16 NOTE — Telephone Encounter (Signed)
Noted  

## 2022-12-16 NOTE — Anesthesia Preprocedure Evaluation (Signed)
Anesthesia Evaluation  Patient identified by MRN, date of birth, ID band  Reviewed: Allergy & Precautions, NPO status , Patient's Chart, lab work & pertinent test results  History of Anesthesia Complications Negative for: history of anesthetic complications  Airway Mallampati: II  TM Distance: >3 FB Neck ROM: Full    Dental no notable dental hx. (+) Edentulous Upper, Missing, Dental Advisory Given,    Pulmonary sleep apnea and Continuous Positive Airway Pressure Ventilation , COPD,  COPD inhaler, Current Smoker and Patient abstained from smoking.   Pulmonary exam normal breath sounds clear to auscultation       Cardiovascular (-) angina + Peripheral Vascular Disease  Normal cardiovascular exam Rhythm:Regular Rate:Normal  05/2021 ECHO: EF 55-60%, normal LVF, normal RVF, no significant valvular abnormalities   Neuro/Psych    Depression       GI/Hepatic Neg liver ROS,GERD  Medicated and Controlled,,  Endo/Other    Morbid obesityBMI 38  Renal/GU negative Renal ROS     Musculoskeletal  (+) Arthritis ,    Abdominal  (+) + obese  Peds  Hematology  (+) Blood dyscrasia (Hb 9.5), anemia plavix   Anesthesia Other Findings All: Amoxicillin  Reproductive/Obstetrics                             Anesthesia Physical Anesthesia Plan  ASA: 3  Anesthesia Plan: MAC   Post-op Pain Management: Minimal or no pain anticipated   Induction: Intravenous  PONV Risk Score and Plan: 2 and Treatment may vary due to age or medical condition and Propofol infusion  Airway Management Planned: Natural Airway and Nasal Cannula  Additional Equipment: None  Intra-op Plan:   Post-operative Plan:   Informed Consent: I have reviewed the patients History and Physical, chart, labs and discussed the procedure including the risks, benefits and alternatives for the proposed anesthesia with the patient or authorized  representative who has indicated his/her understanding and acceptance.     Dental advisory given  Plan Discussed with: CRNA, Anesthesiologist and Surgeon  Anesthesia Plan Comments: (Fe Deficiency anemia hx of polyps)        Anesthesia Quick Evaluation

## 2022-12-16 NOTE — Discharge Instructions (Signed)
Follow up here or with your primary care provider if your symptoms are worsening or not improving with treatment.     

## 2022-12-16 NOTE — Telephone Encounter (Signed)
Agree with precautions given to pt  Agree with nurse assessment in plan.  Thank you for speaking with them. 

## 2022-12-17 ENCOUNTER — Other Ambulatory Visit: Payer: Self-pay

## 2022-12-17 ENCOUNTER — Encounter (HOSPITAL_COMMUNITY): Payer: Self-pay | Admitting: Internal Medicine

## 2022-12-17 ENCOUNTER — Encounter (HOSPITAL_COMMUNITY)
Admission: RE | Disposition: A | Discharge status: Home / Self Care | Payer: Self-pay | Source: Ambulatory Visit | Attending: Internal Medicine

## 2022-12-17 ENCOUNTER — Ambulatory Visit (HOSPITAL_COMMUNITY)
Admission: RE | Admit: 2022-12-17 | Discharge: 2022-12-17 | Disposition: A | Discharge status: Home / Self Care | Payer: PPO | Source: Ambulatory Visit | Attending: Internal Medicine | Admitting: Internal Medicine

## 2022-12-17 ENCOUNTER — Ambulatory Visit: Payer: PPO | Admitting: Family

## 2022-12-17 ENCOUNTER — Ambulatory Visit (HOSPITAL_BASED_OUTPATIENT_CLINIC_OR_DEPARTMENT_OTHER): Payer: PPO | Admitting: Anesthesiology

## 2022-12-17 ENCOUNTER — Ambulatory Visit (HOSPITAL_COMMUNITY): Payer: PPO | Admitting: Anesthesiology

## 2022-12-17 DIAGNOSIS — K31819 Angiodysplasia of stomach and duodenum without bleeding: Secondary | ICD-10-CM | POA: Diagnosis not present

## 2022-12-17 DIAGNOSIS — K3189 Other diseases of stomach and duodenum: Secondary | ICD-10-CM | POA: Diagnosis not present

## 2022-12-17 DIAGNOSIS — F32A Depression, unspecified: Secondary | ICD-10-CM | POA: Insufficient documentation

## 2022-12-17 DIAGNOSIS — Z85828 Personal history of other malignant neoplasm of skin: Secondary | ICD-10-CM | POA: Insufficient documentation

## 2022-12-17 DIAGNOSIS — K449 Diaphragmatic hernia without obstruction or gangrene: Secondary | ICD-10-CM

## 2022-12-17 DIAGNOSIS — K317 Polyp of stomach and duodenum: Secondary | ICD-10-CM | POA: Diagnosis not present

## 2022-12-17 DIAGNOSIS — Z9989 Dependence on other enabling machines and devices: Secondary | ICD-10-CM

## 2022-12-17 DIAGNOSIS — Z9049 Acquired absence of other specified parts of digestive tract: Secondary | ICD-10-CM | POA: Insufficient documentation

## 2022-12-17 DIAGNOSIS — D509 Iron deficiency anemia, unspecified: Secondary | ICD-10-CM | POA: Diagnosis not present

## 2022-12-17 DIAGNOSIS — G4730 Sleep apnea, unspecified: Secondary | ICD-10-CM | POA: Insufficient documentation

## 2022-12-17 DIAGNOSIS — F17210 Nicotine dependence, cigarettes, uncomplicated: Secondary | ICD-10-CM | POA: Diagnosis not present

## 2022-12-17 DIAGNOSIS — G4733 Obstructive sleep apnea (adult) (pediatric): Secondary | ICD-10-CM

## 2022-12-17 DIAGNOSIS — K219 Gastro-esophageal reflux disease without esophagitis: Secondary | ICD-10-CM | POA: Insufficient documentation

## 2022-12-17 DIAGNOSIS — K5900 Constipation, unspecified: Secondary | ICD-10-CM

## 2022-12-17 DIAGNOSIS — K921 Melena: Secondary | ICD-10-CM | POA: Diagnosis not present

## 2022-12-17 DIAGNOSIS — K2971 Gastritis, unspecified, with bleeding: Secondary | ICD-10-CM | POA: Diagnosis not present

## 2022-12-17 DIAGNOSIS — K5520 Angiodysplasia of colon without hemorrhage: Secondary | ICD-10-CM | POA: Diagnosis not present

## 2022-12-17 DIAGNOSIS — K31811 Angiodysplasia of stomach and duodenum with bleeding: Secondary | ICD-10-CM | POA: Insufficient documentation

## 2022-12-17 HISTORY — PX: HOT HEMOSTASIS: SHX5433

## 2022-12-17 HISTORY — PX: ENTEROSCOPY: SHX5533

## 2022-12-17 SURGERY — ENTEROSCOPY
Anesthesia: Monitor Anesthesia Care

## 2022-12-17 MED ORDER — PROPOFOL 500 MG/50ML IV EMUL
INTRAVENOUS | Status: DC | PRN
  Administered 2022-12-17: 80 ug/kg/min via INTRAVENOUS

## 2022-12-17 MED ORDER — LACTATED RINGERS IV SOLN: INTRAVENOUS | Status: DC

## 2022-12-17 MED ORDER — LIDOCAINE 2% (20 MG/ML) 5 ML SYRINGE
INTRAMUSCULAR | Status: DC | PRN
  Administered 2022-12-17: 100 mg via INTRAVENOUS

## 2022-12-17 MED ORDER — SODIUM CHLORIDE 0.9 % IV SOLN: INTRAVENOUS | Status: DC

## 2022-12-17 MED ORDER — PROPOFOL 10 MG/ML IV BOLUS
INTRAVENOUS | Status: DC | PRN
  Administered 2022-12-17: 20 mg via INTRAVENOUS
  Administered 2022-12-17: 10 mg via INTRAVENOUS
  Administered 2022-12-17: 20 mg via INTRAVENOUS
  Administered 2022-12-17: 40 mg via INTRAVENOUS

## 2022-12-17 MED ORDER — GLYCOPYRROLATE 0.2 MG/ML IJ SOLN
INTRAMUSCULAR | Status: DC | PRN
  Administered 2022-12-17: .2 mg via INTRAVENOUS

## 2022-12-17 MED ORDER — PROPOFOL 500 MG/50ML IV EMUL
INTRAVENOUS | Status: AC
  Filled 2022-12-17: qty 50

## 2022-12-17 NOTE — Op Note (Signed)
[facility] Patient Name: Alicia [name] Procedure Date: 12/17/2022 MRN: [id number] Attending MD: Georgian Co , , [id number] Date of Birth: 1946-09-05 CSN: [id number] Age: 77 Admit Type: Outpatient Procedure:                Small bowel enteroscopy Indications:              Iron deficiency anemia, Melena, For therapy of                            angioectasia Providers:                Adline Mango" Camillo Flaming, RN, Burtis Junes, RN, Cherylynn Ridges, Technician, Cleda Daub,                            CRNA Referring MD:             [name] Medicines:                Monitored Anesthesia Care Complications:            No immediate complications. Estimated Blood Loss:     Estimated blood loss was minimal. Procedure:                Pre-Anesthesia Assessment:                           - Prior to the procedure, a History and Physical                            was performed, and patient medications and                            allergies were reviewed. The patient's tolerance of                            previous anesthesia was also reviewed. The risks                            and benefits of the procedure and the sedation                            options and risks were discussed with the patient.                            All questions were answered, and informed consent                            was obtained. Prior Anticoagulants: The patient has                            taken no anticoagulant or antiplatelet agents. ASA  Grade Assessment: III - A patient with severe                            systemic disease. After reviewing the risks and                            benefits, the patient was deemed in satisfactory                            condition to undergo the procedure.                           After obtaining informed consent, the endoscope was                            passed  under direct vision. Throughout the                            procedure, the patient's blood pressure, pulse, and                            oxygen saturations were monitored continuously. The                            [id number]-HQ190L ([id number]) Olympus colonoscope was                            introduced through the mouth and advanced to the                            proximal jejunum. The small bowel enteroscopy was                            accomplished without difficulty. The patient                            tolerated the procedure well. Scope In: Scope Out: Findings:      The examined esophagus was normal.      A hiatal hernia was present.      Multiple sessile polyps with no bleeding and no stigmata of recent       bleeding were found in the gastric body.      Localized erythematous mucosa without bleeding was found in the gastric       antrum.      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 Coagulation for       tissue destruction using argon plasma at 1 liter/minute and 20 watts was       successful.      Four angioectasias with no bleeding were found in the proximal jejunum.       Coagulation for tissue destruction using argon plasma at 1 liter/minute  and 20 watts was successful. Impression:               - Normal esophagus.                           - Hiatal hernia.                           - Erythematous mucosa in the antrum.                           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                            Treated with argon plasma coagulation (APC).                           - Four non-bleeding angioectasias in the jejunum.                            Treated with argon plasma coagulation (APC).                           - No specimens collected. Recommendation:           -  Discharge patient to home (with escort).                           - Return to GI clinic in 4 weeks.                           - The findings and recommendations were discussed                            with the patient. Procedure Code(s):        --- Professional ---                           [phone], Small intestinal endoscopy, enteroscopy                            beyond second portion of duodenum, not including                            ileum; with ablation of tumor(s), polyp(s), or                            other lesion(s) not amenable to removal by hot                            biopsy forceps, bipolar cautery or snare technique Diagnosis Code(s):        --- Professional ---                           K44.9, Diaphragmatic hernia without obstruction or  gangrene                           K31.89, Other diseases of stomach and duodenum                           K31.819, Angiodysplasia of stomach and duodenum                            without bleeding                           K55.20, Angiodysplasia of colon without hemorrhage                           D50.9, Iron deficiency anemia, unspecified                           K92.1, Melena (includes Hematochezia) CPT copyright 2022 American Medical Association. All rights reserved. The codes documented in this report are preliminary and upon coder review may  be revised to meet current compliance requirements. Dr Georgian Co "[name]" Lorenso Courier,  12/17/2022 1:05:03 PM Number of Addenda: 0

## 2022-12-17 NOTE — Anesthesia Postprocedure Evaluation (Signed)
Anesthesia Post Note  Patient: Alicia [name]  Procedure(s) Performed: ENTEROSCOPY HOT HEMOSTASIS (ARGON PLASMA COAGULATION/BICAP)     Patient location during evaluation: Endoscopy Anesthesia Type: MAC Level of consciousness: awake and alert Pain management: pain level controlled Vital Signs Assessment: post-procedure vital signs reviewed and stable Respiratory status: spontaneous breathing, nonlabored ventilation, respiratory function stable and patient connected to nasal cannula oxygen Cardiovascular status: blood pressure returned to baseline and stable Postop Assessment: no apparent nausea or vomiting Anesthetic complications: no  No notable events documented.  Last Vitals:  Vitals:   12/17/22 1311 12/17/22 1320  BP: (!) 158/77 (!) 170/89  Pulse: 98 90  Resp: 20 17  Temp:    SpO2: 95% 93%    Last Pain:  Vitals:   12/17/22 1311  TempSrc:   PainSc: 0-No pain                 Barnet Glasgow

## 2022-12-17 NOTE — H&P (Signed)
GASTROENTEROLOGY PROCEDURE H&P NOTE   Primary Care Physician: [name], FNP    Reason for Procedure:   IDA, melena, duodenal angioectasias  Plan:    SBE  Patient is appropriate for endoscopic procedure(s) in the hospital setting.  The nature of the procedure, as well as the risks, benefits, and alternatives were carefully and thoroughly reviewed with the patient. Ample time for discussion and questions allowed. The patient understood, was satisfied, and agreed to proceed.     HPI: Alicia [name] is a 77 y.o. female who presents for SBE for evaluation of IDA, melena, and duodenal angioectasias .  Patient was most recently seen in the Gastroenterology Clinic on 11/16/22.  No interval change in medical history since that appointment. Please refer to that note for full details regarding GI history and clinical presentation.   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   R mid forearm, [facility]    Past Surgical History:  Procedure Laterality Date   APPENDECTOMY     CELIAC ARTERY STENT     CHOLECYSTECTOMY  10/2016   EYE SURGERY Bilateral 2017   cataracts removed   LUMBAR LAMINECTOMY/DECOMPRESSION MICRODISCECTOMY Left 07/21/2021   Procedure: Laminectomy and Foraminotomy - L4-L5 - left;  Surgeon: Kary Kos, MD;  Location: [place];  Service: Neurosurgery;  Laterality: Left;  3C   PAROTID GLAND TUMOR EXCISION Left 2003   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   Procedure: PERIPHERAL VASCULAR INTERVENTION;  Surgeon: Waynetta Sandy, MD;  Location: [place] [facility];   Service: Cardiovascular;;  celiac   TOTAL SHOULDER ARTHROPLASTY Right 08/06/2022   Procedure: RIGHT REVERSE SHOULDER ARTHROPLASTY;  Surgeon: Meredith Pel, MD;  Location: [place];  Service: Orthopedics;  Laterality: Right;   VISCERAL ANGIOGRAPHY N/A 05/18/2022   Procedure: VISCERAL ANGIOGRAPHY;  Surgeon: Waynetta Sandy, MD;  Location: [place] [facility];  Service: Cardiovascular;  Laterality: N/A;    Prior to Admission medications   Medication Sig Start Date End Date Taking? Authorizing Provider  acetaminophen (TYLENOL) 500 MG tablet Take 1,000 mg by mouth every 6 (six) hours as needed for moderate pain.   Yes [provider]  albuterol (VENTOLIN HFA) 108 (90 Base) MCG/ACT inhaler Inhale 1-2 puffs into the lungs 4 (four) times daily as needed for shortness of breath or wheezing. 07/30/22  Yes Dugal, Tabitha, FNP  amoxicillin-clavulanate (AUGMENTIN) 875-125 MG tablet Take 1 tablet by mouth every 12 (twelve) hours. 12/16/22  Yes Immordino, Annie Main, FNP  aspirin EC 81 MG tablet Take 1 tablet (81 mg total) by mouth daily. Patient taking differently: Take 81 mg by mouth every other day. 08/07/22  Yes [name], [name], PA-C  Budeson-Glycopyrrol-Formoterol (BREZTRI AEROSPHERE) 160-9-4.8 MCG/ACT AERO Inhale 2 puffs into the lungs 2 (two) times daily. 02/05/22  Yes Tanda Rockers, MD  Cholecalciferol (VITAMIN D3) 125 MCG (5000 UT) CAPS Take 5,000 Units by mouth in the morning.   Yes [provider]  faricimab-svoa (VABYSMO) 6 MG/0.05ML SOLN intravitreal injection 6 mg  by Intravitreal route every 8 (eight) weeks.   Yes [provider]  gabapentin (NEURONTIN) 300 MG capsule TAKE 1 CAPSULE BY MOUTH THREE TIMES A DAY 09/04/22  Yes [name], Charles L, PA-C  hydrocortisone 2.5 % cream Apply 1 Application topically daily as needed (rash/irritation). Apply to itchy rash on face qd up to 4 days a week prn flares 06/16/22  Yes Dugal, Tabitha, FNP  Lutein 20 MG TABS Take 20 mg  by mouth in the morning.   Yes [provider]  Magnesium 400 MG TABS Take 400 mg by mouth daily.   Yes [provider]  neomycin-polymyxin-hydrocortisone (CORTISPORIN) OTIC solution Place 4 drops into the left ear 4 (four) times daily. For 1 week Patient taking differently: Place 4 drops into the left ear daily. 11/12/22  Yes Copland, Frederico Hamman, MD  omeprazole (PRILOSEC) 40 MG capsule TAKE 1 CAPSULE (40 MG TOTAL) BY MOUTH DAILY. 08/27/22  Yes Sharyn Creamer, MD  pramipexole (MIRAPEX) 0.5 MG tablet TAKE 1 TABLET BY MOUTH TWICE A DAY 09/04/22  Yes Dugal, Tabitha, FNP  rosuvastatin (CRESTOR) 10 MG tablet Take 1 tablet (10 mg total) by mouth daily. 05/18/22 05/18/23 Yes Waynetta Sandy, MD  venlafaxine XR (EFFEXOR XR) 75 MG 24 hr capsule Take 1 capsule (75 mg total) by mouth daily with breakfast. 10/16/22  Yes Dugal, Tabitha, FNP  predniSONE (DELTASONE) 20 MG tablet Take 3 tablets (60 mg total) by mouth daily with breakfast for 2 days, THEN 2 tablets (40 mg total) daily with breakfast for 2 days, THEN 1 tablet (20 mg total) daily with breakfast for 2 days. 12/16/22 12/21/22  Immordino, Annie Main, FNP    Current Facility-Administered Medications  Medication Dose Route Frequency Provider Last Rate Last Admin   0.9 %  sodium chloride infusion   Intravenous Continuous Sharyn Creamer, MD       lactated ringers infusion   Intravenous Continuous Sharyn Creamer, MD 10 mL/hr at 12/17/22 1059 New Bag at 12/17/22 1059    Allergies as of 11/16/2022 - Review Complete 11/16/2022  Allergen Reaction Noted   Ampicillin  10/20/2022   Penicillins Other (See Comments) 09/30/2016    Family History  Problem Relation Age of Onset   Heart disease Mother    Cerebral aneurysm Father     Social History   Socioeconomic History   Marital status: Widowed    Spouse name: Not on file   Number of children: 0   Years of education: Not on file   Highest education level: Not on file  Occupational  History   Not on file  Tobacco Use   Smoking status: Every Day    Packs/day: 1.50    Years: 31.00    Total pack years: 46.50    Types: Cigarettes    Passive exposure: Current   Smokeless tobacco: Never   Tobacco comments:    Smokes a pack and a half a day [facility] 04/02/22  Vaping Use   Vaping Use: Never used  Substance and Sexual Activity   Alcohol use: Not Currently   Drug use: Never   Sexual activity: Not Currently  Other Topics Concern   Not on file  Social History Narrative   Had 1 child, but child is deceased   Social Determinants of Health   Financial Resource Strain: [place]  (09/08/2022)   Overall Financial Resource Strain (CARDIA)    Difficulty of Paying Living Expenses: Not hard at all  Food Insecurity: No Food Insecurity (09/08/2022)  Hunger Vital Sign    Worried About Running Out of Food in the Last Year: Never true    Ran Out of Food in the Last Year: Never true  Transportation Needs: No Transportation Needs (09/08/2022)   PRAPARE - Hydrologist (Medical): No    Lack of Transportation (Non-Medical): No  Physical Activity: Sufficiently Active (09/08/2022)   Exercise Vital Sign    Days of Exercise per Week: 7 days    Minutes of Exercise per Session: 40 min  Stress: No Stress Concern Present (09/08/2022)   [place]    Feeling of Stress : Not at all  Social Connections: Unknown (09/08/2022)   Social Connection and Isolation Panel [NHANES]    Frequency of Communication with Friends and Family: More than three times a week    Frequency of Social Gatherings with Friends and Family: More than three times a week    Attends Religious Services: Not on file    Active Member of [place] or Organizations: Not on file    Attends Archivist Meetings: Not on file    Marital Status: Not on file  Intimate Partner Violence: Not At Risk (09/08/2022)   Humiliation, Afraid,  Rape, and Kick questionnaire    Fear of Current or Ex-Partner: No    Emotionally Abused: No    Physically Abused: No    Sexually Abused: No    Physical Exam: Vital signs in last 24 hours: BP 126/65   Pulse 76   Temp 97.6 F (36.4 C) (Temporal)   Resp 18   Ht 5' (1.524 m)   Wt 87.1 kg   SpO2 96%   BMI 37.50 kg/m  GEN: NAD EYE: Sclerae anicteric ENT: MMM CV: Non-tachycardic Pulm: No increased WOB GI: Soft NEURO:  Alert & Oriented   Christia Reading, MD [facility]   12/17/2022 12:13 PM

## 2022-12-17 NOTE — Discharge Instructions (Signed)
YOU HAD AN ENDOSCOPIC PROCEDURE TODAY: Refer to the procedure report and other information in the discharge instructions given to you for any specific questions about what was found during the examination. If this information does not answer your questions, please call [place] office at [phone] to clarify.  ° °YOU SHOULD EXPECT: Some feelings of bloating in the abdomen. Passage of more gas than usual. Walking can help get rid of the air that was put into your GI tract during the procedure and reduce the bloating. If you had a lower endoscopy (such as a colonoscopy or flexible sigmoidoscopy) you may notice spotting of blood in your stool or on the toilet paper. Some abdominal soreness may be present for a day or two, also. ° °DIET: Your first meal following the procedure should be a light meal and then it is ok to progress to your normal diet. A half-sandwich or bowl of soup is an example of a good first meal. Heavy or fried foods are harder to digest and may make you feel nauseous or bloated. Drink plenty of fluids but you should avoid alcoholic beverages for 24 hours. If you had a esophageal dilation, please see attached instructions for diet.   ° °ACTIVITY: Your care partner should take you home directly after the procedure. You should plan to take it easy, moving slowly for the rest of the day. You can resume normal activity the day after the procedure however YOU SHOULD NOT DRIVE, use power tools, machinery or perform tasks that involve climbing or major physical exertion for 24 hours (because of the sedation medicines used during the test).  ° °SYMPTOMS TO REPORT IMMEDIATELY: °A gastroenterologist can be reached at any hour. Please call [phone]  for any of the following symptoms:  °Following lower endoscopy (colonoscopy, flexible sigmoidoscopy) °Excessive amounts of blood in the stool  °Significant tenderness, worsening of abdominal pains  °Swelling of the abdomen that is new, acute  °Fever of 100° or  higher  °Following upper endoscopy (EGD, EUS, ERCP, esophageal dilation) °Vomiting of blood or coffee ground material  °New, significant abdominal pain  °New, significant chest pain or pain under the shoulder blades  °Painful or persistently difficult swallowing  °New shortness of breath  °Black, tarry-looking or red, bloody stools ° °FOLLOW UP:  °If any biopsies were taken you will be contacted by phone or by letter within the next 1-3 weeks. Call [phone]  if you have not heard about the biopsies in 3 weeks.  °Please also call with any specific questions about appointments or follow up tests. ° °

## 2022-12-17 NOTE — Transfer of Care (Signed)
Immediate Anesthesia Transfer of Care Note  Patient: Alicia [name]  Procedure(s) Performed: ENTEROSCOPY HOT HEMOSTASIS (ARGON PLASMA COAGULATION/BICAP)  Patient Location: PACU  Anesthesia Type:MAC  Level of Consciousness: awake, alert , oriented, and patient cooperative  Airway & Oxygen Therapy: Patient Spontanous Breathing and Patient connected to face mask oxygen  Post-op Assessment: Report given to RN and Post -op Vital signs reviewed and stable  Post vital signs: Reviewed and stable  Last Vitals:  Vitals Value Taken Time  BP 169/77 12/17/22 1301  Temp 36.3 C 12/17/22 1301  Pulse 104 12/17/22 1302  Resp 22 12/17/22 1302  SpO2 99 % 12/17/22 1302  Vitals shown include unvalidated device data.  Last Pain:  Vitals:   12/17/22 1301  TempSrc: Tympanic  PainSc: Asleep         Complications: No notable events documented.

## 2022-12-18 ENCOUNTER — Other Ambulatory Visit: Payer: Self-pay | Admitting: *Deleted

## 2022-12-18 DIAGNOSIS — D509 Iron deficiency anemia, unspecified: Secondary | ICD-10-CM

## 2022-12-21 ENCOUNTER — Encounter (HOSPITAL_COMMUNITY): Payer: Self-pay | Admitting: Internal Medicine

## 2022-12-21 ENCOUNTER — Inpatient Hospital Stay: Payer: PPO | Attending: Oncology

## 2022-12-21 DIAGNOSIS — D509 Iron deficiency anemia, unspecified: Secondary | ICD-10-CM | POA: Insufficient documentation

## 2022-12-21 LAB — CBC WITH DIFFERENTIAL/PLATELET
Abs Immature Granulocytes: 0.02 10*3/uL (ref 0.00–0.07)
Basophils Absolute: 0 10*3/uL (ref 0.0–0.1)
Basophils Relative: 0 %
Eosinophils Absolute: 0.1 10*3/uL (ref 0.0–0.5)
Eosinophils Relative: 1 %
HCT: 37.8 % (ref 36.0–46.0)
Hemoglobin: 11.7 g/dL — ABNORMAL LOW (ref 12.0–15.0)
Immature Granulocytes: 0 %
Lymphocytes Relative: 11 %
Lymphs Abs: 1 10*3/uL (ref 0.7–4.0)
MCH: 26.4 pg (ref 26.0–34.0)
MCHC: 31 g/dL (ref 30.0–36.0)
MCV: 85.3 fL (ref 80.0–100.0)
Monocytes Absolute: 0.4 10*3/uL (ref 0.1–1.0)
Monocytes Relative: 4 %
Neutro Abs: 7.9 10*3/uL — ABNORMAL HIGH (ref 1.7–7.7)
Neutrophils Relative %: 84 %
Platelets: 199 10*3/uL (ref 150–400)
RBC: 4.43 MIL/uL (ref 3.87–5.11)
RDW: 22.2 % — ABNORMAL HIGH (ref 11.5–15.5)
WBC: 9.4 10*3/uL (ref 4.0–10.5)
nRBC: 0 % (ref 0.0–0.2)

## 2022-12-21 LAB — IRON AND TIBC
Iron: 25 ug/dL — ABNORMAL LOW (ref 28–170)
Saturation Ratios: 6 % — ABNORMAL LOW (ref 10.4–31.8)
TIBC: 445 ug/dL (ref 250–450)
UIBC: 420 ug/dL

## 2022-12-21 LAB — FERRITIN: Ferritin: 10 ng/mL — ABNORMAL LOW (ref 11–307)

## 2022-12-24 ENCOUNTER — Inpatient Hospital Stay: Payer: PPO

## 2022-12-24 ENCOUNTER — Encounter: Payer: Self-pay | Admitting: Oncology

## 2022-12-24 ENCOUNTER — Inpatient Hospital Stay (HOSPITAL_BASED_OUTPATIENT_CLINIC_OR_DEPARTMENT_OTHER): Payer: PPO | Admitting: Oncology

## 2022-12-24 VITALS — BP 115/68 | HR 63 | Temp 98.5°F | Resp 16 | Ht 60.0 in | Wt 190.0 lb

## 2022-12-24 VITALS — BP 125/69 | HR 83

## 2022-12-24 DIAGNOSIS — D509 Iron deficiency anemia, unspecified: Secondary | ICD-10-CM

## 2022-12-24 MED ORDER — SODIUM CHLORIDE 0.9 % IV SOLN
INTRAVENOUS | Status: DC
  Filled 2022-12-24: qty 250

## 2022-12-24 MED ORDER — SODIUM CHLORIDE 0.9 % IV SOLN
200.0000 mg | Freq: Once | INTRAVENOUS | Status: AC
  Administered 2022-12-24: 200 mg via INTRAVENOUS
  Filled 2022-12-24: qty 200

## 2022-12-24 NOTE — Progress Notes (Signed)
[place]  [phone] [phone]  ID: Alicia [name] OB: 1946/11/01  MR#: [id number]  [id number]  Patient Care Team: [name], FNP as PCP - General (Family Medicine)  CHIEF COMPLAINT: Iron deficiency anemia.  INTERVAL HISTORY: Patient returns to clinic today for repeat laboratory work, further evaluation, and consideration of additional IV Venofer.  She recently underwent capsule endoscopy that revealed approximately 20 nonbleeding angioectasias.  She has noted no melena or hematochezia.  Since receiving IV iron, she has felt significantly improved, but not back to her baseline.  She has no neurologic complaints.  She denies any recent fevers or illnesses.  She has a good appetite and denies weight loss.  She has no chest pain, shortness of breath, cough, or hemoptysis.  She denies any nausea, vomiting, constipation, or diarrhea.   She has no urinary complaints. Patient offers no further specific complaints today.  REVIEW OF SYSTEMS:   Review of Systems  Constitutional:  Positive for malaise/fatigue. Negative for fever and weight loss.  Respiratory: Negative.  Negative for cough, hemoptysis and shortness of breath.   Cardiovascular: Negative.  Negative for chest pain and leg swelling.  Gastrointestinal: Negative.  Negative for abdominal pain, blood in stool, constipation, diarrhea, melena and nausea.  Genitourinary: Negative.  Negative for hematuria.  Musculoskeletal:  Positive for joint pain. Negative for back pain.  Skin: Negative.  Negative for rash.  Neurological:  Positive for weakness. Negative for dizziness, focal weakness and headaches.    As per HPI. Otherwise, a complete review of systems is negative.  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   R mid forearm, [facility]    PAST SURGICAL HISTORY: Past Surgical History:  Procedure Laterality Date   APPENDECTOMY     CELIAC ARTERY STENT     CHOLECYSTECTOMY  10/2016   ENTEROSCOPY N/A 12/17/2022   Procedure: ENTEROSCOPY;  Surgeon: Sharyn Creamer, MD;  Location: [facility];  Service: Gastroenterology;  Laterality: N/A;  small bowel enteroscopy   EYE SURGERY Bilateral 2017   cataracts removed   HOT HEMOSTASIS  12/17/2022   Procedure: HOT HEMOSTASIS (ARGON PLASMA COAGULATION/BICAP);  Surgeon: Sharyn Creamer, MD;  Location: [facility];  Service: Gastroenterology;;   LUMBAR LAMINECTOMY/DECOMPRESSION MICRODISCECTOMY Left 07/21/2021   Procedure: Laminectomy and Foraminotomy - L4-L5 - left;  Surgeon: Kary Kos, MD;  Location: [facility];  Service: Neurosurgery;  Laterality: Left;  3C   PAROTID GLAND TUMOR EXCISION Left 2003   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   Procedure: PERIPHERAL VASCULAR INTERVENTION;  Surgeon: Waynetta Sandy, MD;  Location: [facility];  Service: Cardiovascular;;  celiac   TOTAL SHOULDER ARTHROPLASTY Right 08/06/2022   Procedure: RIGHT REVERSE SHOULDER ARTHROPLASTY;  Surgeon: Meredith Pel, MD;  Location: [facility];  Service: Orthopedics;  Laterality: Right;   VISCERAL ANGIOGRAPHY N/A 05/18/2022   Procedure: VISCERAL ANGIOGRAPHY;  Surgeon: Waynetta Sandy, MD;  Location: [facility];  Service: Cardiovascular;  Laterality: N/A;  FAMILY HISTORY: Family History  Problem Relation Age of Onset   Heart disease Mother    Cerebral aneurysm Father     ADVANCED DIRECTIVES (Y/N):  N  HEALTH MAINTENANCE: Social History   Tobacco Use   Smoking status: Every Day    Packs/day:  1.50    Years: 31.00    Total pack years: 46.50    Types: Cigarettes    Passive exposure: Current   Smokeless tobacco: Never   Tobacco comments:    Smokes a pack and a half a day [facility] 04/02/22  Vaping Use   Vaping Use: Never used  Substance Use Topics   Alcohol use: Not Currently   Drug use: Never     Colonoscopy:  PAP:  Bone density:  Lipid panel:  Allergies  Allergen Reactions   Amoxicillin     Yeast infections    Current Outpatient Medications  Medication Sig Dispense Refill   acetaminophen (TYLENOL) 500 MG tablet Take 1,000 mg by mouth every 6 (six) hours as needed for moderate pain.     albuterol (VENTOLIN HFA) 108 (90 Base) MCG/ACT inhaler Inhale 1-2 puffs into the lungs 4 (four) times daily as needed for shortness of breath or wheezing. 1 each 0   amoxicillin-clavulanate (AUGMENTIN) 875-125 MG tablet Take 1 tablet by mouth every 12 (twelve) hours. 14 tablet 0   aspirin EC 81 MG tablet Take 1 tablet (81 mg total) by mouth daily. (Patient taking differently: Take 81 mg by mouth every other day.) 14 tablet 0   Budeson-Glycopyrrol-Formoterol (BREZTRI AEROSPHERE) 160-9-4.8 MCG/ACT AERO Inhale 2 puffs into the lungs 2 (two) times daily. 10.7 g 11   Cholecalciferol (VITAMIN D3) 125 MCG (5000 UT) CAPS Take 5,000 Units by mouth in the morning.     faricimab-svoa (VABYSMO) 6 MG/0.05ML SOLN intravitreal injection 6 mg by Intravitreal route every 8 (eight) weeks.     fluconazole (DIFLUCAN) 150 MG tablet Take 150 mg by mouth once.     gabapentin (NEURONTIN) 300 MG capsule TAKE 1 CAPSULE BY MOUTH THREE TIMES A DAY 270 capsule 1   hydrocortisone 2.5 % cream Apply 1 Application topically daily as needed (rash/irritation). Apply to itchy rash on face qd up to 4 days a week prn flares     Lutein 20 MG TABS Take 20 mg by mouth in the morning.     Magnesium 400 MG TABS Take 400 mg by mouth daily.     omeprazole (PRILOSEC) 40 MG capsule TAKE 1 CAPSULE (40 MG TOTAL) BY MOUTH DAILY. 90 capsule  1   pramipexole (MIRAPEX) 0.5 MG tablet TAKE 1 TABLET BY MOUTH TWICE A DAY 180 tablet 1   rosuvastatin (CRESTOR) 10 MG tablet Take 1 tablet (10 mg total) by mouth daily. 30 tablet 11   venlafaxine XR (EFFEXOR XR) 75 MG 24 hr capsule Take 1 capsule (75 mg total) by mouth daily with breakfast. 90 capsule 0   No current facility-administered medications for this visit.    OBJECTIVE: Vitals:   12/24/22 1350  BP: 115/68  Pulse: 63  Resp: 16  Temp: 98.5 F (36.9 C)  SpO2: 99%     Body mass index is 37.11 kg/m.    ECOG FS:1 - Symptomatic but completely ambulatory  General: Well-developed, well-nourished, no acute distress. Eyes: Pink conjunctiva, anicteric sclera. HEENT: Normocephalic, moist mucous membranes. Lungs: No audible wheezing or coughing. Heart: Regular rate and rhythm. Abdomen: Soft, nontender, no obvious distention. Musculoskeletal: No edema, cyanosis, or clubbing. Neuro: Alert, answering all questions  appropriately. Cranial nerves grossly intact. Skin: No rashes or petechiae noted. Psych: Normal affect.  LAB RESULTS:  Lab Results  Component Value Date   NA 140 07/29/2022   K 4.5 07/29/2022   CL 108 07/29/2022   CO2 26 07/29/2022   GLUCOSE 91 07/29/2022   BUN 8 07/29/2022   CREATININE 0.63 07/29/2022   CALCIUM 9.4 07/29/2022   PROT 8.2 01/13/2022   ALBUMIN 4.4 01/13/2022   AST 17 01/13/2022   ALT 10 01/13/2022   ALKPHOS 112 01/13/2022   BILITOT 0.4 01/13/2022   GFRNONAA >60 07/29/2022    Lab Results  Component Value Date   WBC 9.4 12/21/2022   NEUTROABS 7.9 (H) 12/21/2022   HGB 11.7 (L) 12/21/2022   HCT 37.8 12/21/2022   MCV 85.3 12/21/2022   PLT 199 12/21/2022   Lab Results  Component Value Date   IRON 25 (L) 12/21/2022   TIBC 445 12/21/2022   IRONPCTSAT 6 (L) 12/21/2022   Lab Results  Component Value Date   FERRITIN 10 (L) 12/21/2022     STUDIES: No results found.  ASSESSMENT: Iron deficiency anemia.  PLAN:    1.  Iron deficiency  anemia: Possibly secondary to nonbleeding angioectasias noted in her small intestine.  Patient's hemoglobin has significantly improved to 11.7, but her iron stores remain decreased.  Proceed with IV Venofer today.  Patient will also receive 2 additional doses next week.  Return to clinic in 4 months with repeat laboratory work, further evaluation, and continuation of treatment if needed.  2.  Constipation/nausea: Improved.  Continue evaluation and treatment per GI. 3.  Shoulder pain: Patient recently underwent right shoulder replacement and now reports she may need left shoulder replacement.  I spent a total of 30 minutes reviewing chart data, face-to-face evaluation with the patient, counseling and coordination of care as detailed above.   Patient expressed understanding and was in agreement with this plan. She also understands that She can call clinic at any time with any questions, concerns, or complaints.    Lloyd Huger, MD   12/25/2022 9:56 AM

## 2022-12-24 NOTE — Patient Instructions (Signed)

## 2022-12-25 ENCOUNTER — Encounter: Payer: Self-pay | Admitting: Oncology

## 2022-12-28 ENCOUNTER — Inpatient Hospital Stay: Payer: PPO

## 2022-12-28 ENCOUNTER — Encounter: Payer: Self-pay | Admitting: Family

## 2022-12-28 ENCOUNTER — Ambulatory Visit (INDEPENDENT_AMBULATORY_CARE_PROVIDER_SITE_OTHER): Payer: PPO | Admitting: Family

## 2022-12-28 VITALS — BP 125/69 | HR 80 | Temp 97.9°F | Resp 16

## 2022-12-28 VITALS — BP 130/78 | HR 70 | Temp 97.7°F | Ht 60.0 in | Wt 190.4 lb

## 2022-12-28 DIAGNOSIS — D509 Iron deficiency anemia, unspecified: Secondary | ICD-10-CM

## 2022-12-28 DIAGNOSIS — F17210 Nicotine dependence, cigarettes, uncomplicated: Secondary | ICD-10-CM

## 2022-12-28 DIAGNOSIS — Z79899 Other long term (current) drug therapy: Secondary | ICD-10-CM | POA: Diagnosis not present

## 2022-12-28 DIAGNOSIS — R634 Abnormal weight loss: Secondary | ICD-10-CM | POA: Diagnosis not present

## 2022-12-28 DIAGNOSIS — E782 Mixed hyperlipidemia: Secondary | ICD-10-CM | POA: Diagnosis not present

## 2022-12-28 DIAGNOSIS — Z6837 Body mass index (BMI) 37.0-37.9, adult: Secondary | ICD-10-CM | POA: Diagnosis not present

## 2022-12-28 DIAGNOSIS — F4323 Adjustment disorder with mixed anxiety and depressed mood: Secondary | ICD-10-CM

## 2022-12-28 LAB — COMPREHENSIVE METABOLIC PANEL
ALT: 9 U/L (ref 0–35)
AST: 13 U/L (ref 0–37)
Albumin: 4 g/dL (ref 3.5–5.2)
Alkaline Phosphatase: 86 U/L (ref 39–117)
BUN: 10 mg/dL (ref 6–23)
CO2: 26 mEq/L (ref 19–32)
Calcium: 9.5 mg/dL (ref 8.4–10.5)
Chloride: 104 mEq/L (ref 96–112)
Creatinine, Ser: 0.7 mg/dL (ref 0.40–1.20)
GFR: 83.54 mL/min (ref >60.00)
Glucose, Bld: 87 mg/dL (ref 70–99)
Potassium: 3.9 mEq/L (ref 3.5–5.1)
Sodium: 139 mEq/L (ref 135–145)
Total Bilirubin: 0.3 mg/dL (ref 0.2–1.2)
Total Protein: 7 g/dL (ref 6.0–8.3)

## 2022-12-28 LAB — LIPID PANEL
Cholesterol: 130 mg/dL (ref 0–200)
HDL: 58.7 mg/dL (ref >39.00)
LDL Cholesterol: 55 mg/dL (ref 0–99)
NonHDL: 70.94
Total CHOL/HDL Ratio: 2
Triglycerides: 80 mg/dL (ref 0.0–149.0)
VLDL: 16 mg/dL (ref 0.0–40.0)

## 2022-12-28 LAB — TSH: TSH: 3.55 u[IU]/mL (ref 0.35–5.50)

## 2022-12-28 LAB — T4, FREE: Free T4: 0.7 ng/dL (ref 0.60–1.60)

## 2022-12-28 MED ORDER — SODIUM CHLORIDE 0.9 % IV SOLN
INTRAVENOUS | Status: DC
  Filled 2022-12-28: qty 250

## 2022-12-28 MED ORDER — SODIUM CHLORIDE 0.9 % IV SOLN
200.0000 mg | Freq: Once | INTRAVENOUS | Status: AC
  Administered 2022-12-28: 200 mg via INTRAVENOUS
  Filled 2022-12-28: qty 200

## 2022-12-28 NOTE — Assessment & Plan Note (Signed)
Smoking cessation instruction/counseling given:  counseled patient on the dangers of tobacco use, advised patient to stop smoking, and reviewed strategies to maximize success 

## 2022-12-28 NOTE — Patient Instructions (Addendum)
 ------------------------------------   Recommend daily flonase and also claritin at night for allergies.   ------------------------------------  Stop by the lab prior to leaving today. I will notify you of your results once received.   Regards,   [name] FNP-C

## 2022-12-28 NOTE — Assessment & Plan Note (Signed)
Improving Continue f/u with hematology as scheduled

## 2022-12-28 NOTE — Assessment & Plan Note (Signed)
Improving with increase in venlafaxine Continue 75 mg once daily dose.  Phq9 and gad 7 reviewed.

## 2022-12-28 NOTE — Progress Notes (Signed)
Established Patient Office Visit  Subjective:      CC:  Chief Complaint  Patient presents with   Medical Management of Chronic Issues    HPI: Alicia [name] is a 77 y.o. female presenting on 12/28/2022 for Medical Management of Chronic Issues . Anxiety depression: last visit 12/8 increased venlafaxine to 75 mg once daily. Pt states feeling much better with increase in venlafaxine. 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 Iron still low. Seeing hematology, has a few more iron infusions this week and then will f/u in four months.  Lab Results  Component Value Date   IRON 25 (L) 12/21/2022   TIBC 445 12/21/2022   FERRITIN 10 (L) 12/21/2022   Diarrhea: still with loose stools, no longer getting up to go to the bathroom for stool. Seeing GI regularly still.  Lab Results  Component Value Date   WBC 9.4 12/21/2022   HGB 11.7 (L) 12/21/2022   HCT 37.8 12/21/2022   MCV 85.3 12/21/2022   PLT 199 12/21/2022    New complaints: Sinusitis, went to urgent care , has appt with ENT in march. Was given augmentin and also prednisone which cleared her sinuses up well. She is going to keep her appt with ENT as scheduled for chronic sinusitis.    Wt Readings from Last 3 Encounters:  12/28/22 190 lb 6.4 oz (86.4 kg)  12/24/22 190 lb (86.2 kg)  12/17/22 192 lb 0.3 oz (87.1 kg)     Social history:  Relevant past medical, surgical, family and social history reviewed and updated as indicated. Interim medical history since our last visit reviewed.  Allergies and medications reviewed and updated.  DATA REVIEWED: CHART IN EPIC     ROS: Negative unless specifically indicated above in HPI.    Current Outpatient Medications:    acetaminophen (TYLENOL) 500 MG tablet, Take 1,000 mg by mouth every 6 (six) hours as needed for moderate pain., Disp: , Rfl:     Budeson-Glycopyrrol-Formoterol (BREZTRI AEROSPHERE) 160-9-4.8 MCG/ACT AERO, Inhale 2 puffs into the lungs 2 (two) times daily., Disp: 10.7 g, Rfl: 11   Cholecalciferol (VITAMIN D3) 125 MCG (5000 UT) CAPS, Take 5,000 Units by mouth in the morning., Disp: , Rfl:    faricimab-svoa (VABYSMO) 6 MG/0.05ML SOLN intravitreal injection, 6 mg by Intravitreal route every 8 (eight) weeks., Disp: , Rfl:    gabapentin (NEURONTIN) 300 MG capsule, TAKE 1 CAPSULE BY MOUTH THREE TIMES A DAY, Disp: 270 capsule, Rfl: 1   hydrocortisone 2.5 % cream, Apply 1 Application topically daily as needed (rash/irritation). Apply to itchy rash on face qd up to 4 days a week prn flares, Disp: , Rfl:    Lutein 20 MG TABS, Take 20 mg by mouth in the morning., Disp: , Rfl:    Magnesium 400 MG TABS, Take 400 mg by mouth daily., Disp: , Rfl:    omeprazole (PRILOSEC) 40 MG capsule, TAKE 1 CAPSULE (40 MG TOTAL) BY MOUTH DAILY., Disp: 90 capsule, Rfl: 1   pramipexole (MIRAPEX) 0.5 MG tablet, TAKE 1 TABLET BY MOUTH TWICE A DAY, Disp: 180 tablet, Rfl: 1   rosuvastatin (CRESTOR) 10 MG tablet, Take 1 tablet (10 mg total) by mouth daily., Disp: 30 tablet, Rfl: 11   venlafaxine XR (EFFEXOR XR) 75 MG 24 hr capsule, Take 1 capsule (75 mg total) by mouth daily with breakfast., Disp: 90 capsule, Rfl:  0      Objective:    BP 130/78   Pulse 70   Temp 97.7 F (36.5 C) (Temporal)   Ht 5' (1.524 m)   Wt 190 lb 6.4 oz (86.4 kg)   SpO2 98%   BMI 37.18 kg/m   Wt Readings from Last 3 Encounters:  12/28/22 190 lb 6.4 oz (86.4 kg)  12/24/22 190 lb (86.2 kg)  12/17/22 192 lb 0.3 oz (87.1 kg)    Physical Exam Constitutional:      General: She is not in acute distress.    Appearance: Normal appearance. She is obese. She is not ill-appearing, toxic-appearing or diaphoretic.  HENT:     Head: Normocephalic.     Right Ear: Hearing, ear canal and external ear normal. No middle ear effusion.     Left Ear: Hearing, ear canal and external ear normal.  A middle ear effusion (clear) is present. Tympanic membrane is not erythematous.     Nose:     Right Turbinates: Enlarged and pale.     Left Turbinates: Enlarged and pale.     Mouth/Throat:     Mouth: Lacerations present.     Pharynx: Posterior oropharyngeal erythema (cobblestoning of throat) present.  Cardiovascular:     Rate and Rhythm: Normal rate.  Pulmonary:     Effort: Pulmonary effort is normal.  Musculoskeletal:        General: Normal range of motion.  Neurological:     General: No focal deficit present.     Mental Status: She is alert and oriented to person, place, and time. Mental status is at baseline.  Psychiatric:        Mood and Affect: Mood normal.        Behavior: Behavior normal.        Thought Content: Thought content normal.        Judgment: Judgment normal.           Assessment & Plan:  Class 2 severe obesity due to excess calories with serious comorbidity and body mass index (BMI) of 37.0 to 37.9 in adult [facility])  On statin therapy -     Comprehensive metabolic panel  Mixed hyperlipidemia Assessment & Plan: Ordered lipid panel, pending results. Work on low cholesterol diet and exercise as tolerated Continue crestor as prescribed  Orders: -     Lipid panel -     Comprehensive metabolic panel  Iron deficiency anemia, unspecified iron deficiency anemia type Assessment & Plan: Improving Continue f/u with hematology as scheduled   Abnormal weight loss -     TSH -     T4, free  Cigarette smoker Assessment & Plan: Smoking cessation instruction/counseling given:  counseled patient on the dangers of tobacco use, advised patient to stop smoking, and reviewed strategies to maximize success    Adjustment reaction with anxiety and depression Assessment & Plan: Improving with increase in venlafaxine Continue 75 mg once daily dose.  Phq9 and gad 7 reviewed.       Return in about 6 months (around 06/28/2023) for f/u CPE.  [name], MSN,  APRN, FNP-C [place]

## 2022-12-28 NOTE — Assessment & Plan Note (Signed)
Ordered lipid panel, pending results. Work on low cholesterol diet and exercise as tolerated Continue crestor as prescribed

## 2022-12-29 ENCOUNTER — Inpatient Hospital Stay: Payer: PPO

## 2022-12-30 ENCOUNTER — Ambulatory Visit (INDEPENDENT_AMBULATORY_CARE_PROVIDER_SITE_OTHER): Payer: PPO | Admitting: Physician Assistant

## 2022-12-30 ENCOUNTER — Ambulatory Visit (HOSPITAL_COMMUNITY)
Admission: RE | Admit: 2022-12-30 | Discharge: 2022-12-30 | Disposition: A | Discharge status: Home / Self Care | Payer: PPO | Source: Ambulatory Visit | Attending: Vascular Surgery | Admitting: Vascular Surgery

## 2022-12-30 VITALS — BP 169/85 | HR 73 | Temp 97.8°F | Ht 60.0 in | Wt 189.0 lb

## 2022-12-30 DIAGNOSIS — K551 Chronic vascular disorders of intestine: Secondary | ICD-10-CM | POA: Insufficient documentation

## 2022-12-30 NOTE — Progress Notes (Signed)
Office Note     CC:  follow up Requesting Provider:  [name], FNP  HPI: Alicia [name] is a 77 y.o. 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 She is s/p Aortogram, celiac artery stenting by Dr. Donzetta Matters on 05/18/22.She had resolution of her symptoms post intervention. She was initially managed on Aspirin, Plavix and statin. At time of her last visit with Dr. Donzetta Matters in August she was overall doing well. She was transitioned off of her Plavix to just Aspirin and statin alone. Her duplex at the time did show celiac and SMA stenosis but with lack of symptoms no intervention was indicated  Today she reports no abdominal pain or back pain. She does not have any loss of appetite, food fear, post prandial pain, unintended weight loss. She says she still is having trouble with getting her bowels movements regular but she is managed by GI for this. Overall she says she is doing well. She does not have any pain in her lower extremities on ambulation or rest. No tissue loss. She reports cramping in her left leg behind the knee about 3 nights / week. She gets up and walks around and it resolves. No swelling or symptoms from her varicose veins/ spider veins. Says she has had them for years  The pt is on a statin for cholesterol management.  The pt is on a daily aspirin.  Other AC:  none The pt is not on medication for hypertension.   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   R mid forearm, [facility]     Past Surgical History:  Procedure Laterality Date   APPENDECTOMY     CELIAC ARTERY STENT     CHOLECYSTECTOMY  10/2016   ENTEROSCOPY N/A 12/17/2022   Procedure: ENTEROSCOPY;  Surgeon: Sharyn Creamer, MD;  Location: [facility];  Service: Gastroenterology;  Laterality: N/A;  small bowel enteroscopy   EYE SURGERY Bilateral 2017   cataracts removed   HOT HEMOSTASIS  12/17/2022   Procedure: HOT HEMOSTASIS (ARGON PLASMA COAGULATION/BICAP);  Surgeon: Sharyn Creamer, MD;  Location: [facility];  Service: Gastroenterology;;   LUMBAR LAMINECTOMY/DECOMPRESSION MICRODISCECTOMY Left 07/21/2021   Procedure: Laminectomy and Foraminotomy - L4-L5 - left;  Surgeon: Kary Kos, MD;  Location: [facility];  Service: Neurosurgery;  Laterality: Left;  3C   PAROTID GLAND TUMOR EXCISION Left 2003   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   Procedure: PERIPHERAL VASCULAR INTERVENTION;  Surgeon: Waynetta Sandy, MD;  Location: [facility];  Service: Cardiovascular;;  celiac   TOTAL SHOULDER ARTHROPLASTY Right 08/06/2022   Procedure: RIGHT REVERSE SHOULDER ARTHROPLASTY;  Surgeon: Meredith Pel, MD;  Location: [facility];  Service: Orthopedics;  Laterality: Right;   VISCERAL ANGIOGRAPHY N/A 05/18/2022   Procedure: VISCERAL ANGIOGRAPHY;  Surgeon: Waynetta Sandy, MD;  Location: [facility];  Service: Cardiovascular;  Laterality: N/A;    Social  History   Socioeconomic History   Marital status: Widowed    Spouse name: Not on file   Number of children: 0   Years of education: Not on file   Highest education level: Not on file  Occupational History   Not on file  Tobacco Use   Smoking status: Every Day    Packs/day: 1.00    Years: 31.00    Total pack years: 31.00    Types: Cigarettes    Passive exposure: Current   Smokeless tobacco: Never   Tobacco comments:    Smokes a pack and a half a day [facility] 04/02/22  Vaping Use   Vaping Use: Never used  Substance and Sexual  Activity   Alcohol use: Not Currently   Drug use: Never   Sexual activity: Not Currently  Other Topics Concern   Not on file  Social History Narrative   Had 1 child, but child is deceased   Social Determinants of Health   Financial Resource Strain: Low Risk  (09/08/2022)   Overall Financial Resource Strain (CARDIA)    Difficulty of Paying Living Expenses: Not hard at all  Food Insecurity: No [place] (09/08/2022)   Hunger Vital Sign    Worried About Running Out of Food in the Last Year: Never true    Ran Out of Food in the Last Year: Never true  Transportation Needs: No Transportation Needs (09/08/2022)   PRAPARE - Hydrologist (Medical): No    Lack of Transportation (Non-Medical): No  Physical Activity: Sufficiently Active (09/08/2022)   Exercise Vital Sign    Days of Exercise per Week: 7 days    Minutes of Exercise per Session: 40 min  Stress: No Stress Concern Present (09/08/2022)   [place]    Feeling of Stress : Not at all  Social Connections: Unknown (09/08/2022)   Social Connection and Isolation Panel [NHANES]    Frequency of Communication with Friends and Family: More than three times a week    Frequency of Social Gatherings with Friends and Family: More than three times a week    Attends Religious Services: Not on file    Active Member of [place] or Organizations: Not on file    Attends Archivist Meetings: Not on file    Marital Status: Not on file  Intimate Partner Violence: Not At Risk (09/08/2022)   Humiliation, Afraid, Rape, and Kick questionnaire    Fear of Current or Ex-Partner: No    Emotionally Abused: No    Physically Abused: No    Sexually Abused: No    Family History  Problem Relation Age of Onset   Heart disease Mother    Cerebral aneurysm Father     Current Outpatient Medications  Medication Sig Dispense Refill   acetaminophen  (TYLENOL) 500 MG tablet Take 1,000 mg by mouth every 6 (six) hours as needed for moderate pain.     aspirin EC 81 MG tablet Take 81 mg by mouth every other day. Swallow whole.     Budeson-Glycopyrrol-Formoterol (BREZTRI AEROSPHERE) 160-9-4.8 MCG/ACT AERO Inhale 2 puffs into the lungs 2 (two) times daily. 10.7 g 11   Cholecalciferol (VITAMIN D3) 125 MCG (5000 UT) CAPS Take 5,000 Units by mouth in the morning.     faricimab-svoa (VABYSMO) 6 MG/0.05ML SOLN intravitreal injection 6 mg by Intravitreal route every 8 (eight) weeks.     gabapentin (NEURONTIN) 300 MG capsule TAKE 1 CAPSULE BY  MOUTH THREE TIMES A DAY 270 capsule 1   hydrocortisone 2.5 % cream Apply 1 Application topically daily as needed (rash/irritation). Apply to itchy rash on face qd up to 4 days a week prn flares     Lutein 20 MG TABS Take 20 mg by mouth in the morning.     Magnesium 400 MG TABS Take 400 mg by mouth daily.     omeprazole (PRILOSEC) 40 MG capsule TAKE 1 CAPSULE (40 MG TOTAL) BY MOUTH DAILY. 90 capsule 1   pramipexole (MIRAPEX) 0.5 MG tablet TAKE 1 TABLET BY MOUTH TWICE A DAY 180 tablet 1   rosuvastatin (CRESTOR) 10 MG tablet Take 1 tablet (10 mg total) by mouth daily. 30 tablet 11   venlafaxine XR (EFFEXOR XR) 75 MG 24 hr capsule Take 1 capsule (75 mg total) by mouth daily with breakfast. 90 capsule 0   No current facility-administered medications for this visit.    Allergies  Allergen Reactions   Amoxicillin     Yeast infections     REVIEW OF SYSTEMS:  [X]$  denotes positive finding, [ ]$  denotes negative finding Cardiac  Comments:  Chest pain or chest pressure:    Shortness of breath upon exertion:    Short of breath when lying flat:    Irregular heart rhythm:        Vascular    Pain in calf, thigh, or hip brought on by ambulation:    Pain in feet at night that wakes you up from your sleep:     Blood clot in your veins:    Leg swelling:         Pulmonary    Oxygen at home:    Productive cough:      Wheezing:         Neurologic    Sudden weakness in arms or legs:     Sudden numbness in arms or legs:     Sudden onset of difficulty speaking or slurred speech:    Temporary loss of vision in one eye:     Problems with dizziness:         Gastrointestinal    Blood in stool:     Vomited blood:         Genitourinary    Burning when urinating:     Blood in urine:        Psychiatric    Major depression:         Hematologic    Bleeding problems:    Problems with blood clotting too easily:        Skin    Rashes or ulcers:        Constitutional    Fever or chills:      PHYSICAL EXAMINATION:  Vitals:   12/30/22 0842  BP: (!) 169/85  Pulse: 73  Temp: 97.8 F (36.6 C)  TempSrc: Temporal  SpO2: 95%  Weight: 189 lb (85.7 kg)  Height: 5' (1.524 m)    General:  WDWN in NAD; vital signs documented above Gait: Normal, uses cane HENT: WNL, normocephalic Pulmonary: normal non-labored breathing without wheezing Cardiac: regular HR, without  Murmurs without carotid bruits Abdomen: soft, NT, no masses. Normal bowel sounds Vascular Exam/Pulses:  Right Left  Radial 2+ (normal) 2+ (normal)  DP 1+ (weak) 1+ (weak)  PT absent absent   Extremities: without ischemic changes, without Gangrene , without cellulitis; without open wounds;  Musculoskeletal: no muscle wasting or atrophy  Neurologic: A&O X 3;  No focal weakness  or paresthesias are detected Psychiatric:  The pt has Normal affect.   Non-Invasive Vascular Imaging:   Mesenteric Limited Summary:  Mesenteric: Celiac artery stent is patent with 70 to 99% stenosis in the celiac  artery, however could not clearly visualize stent walls.    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    Unable to adequately visualize SMA and IMA secondary to overlying bowel gas.    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    ASSESSMENT/PLAN:: 77 y.o.  female here for follow up for mesenteric stenosis. She has known celiac and SMA stenosis s/p Celiac artery stenting.  Her duplex is essentially unchanged from her prior duplex with continued elevated velocities in the celiac artery. Some difficulty with visualization of SMA and IMA secondary to bowel gas. Some increased velocities noted in CHA, which is only new finding from her prior study. She is not having any associated symptoms related to her mesenteric stenosis -Continue Aspirin and statin - she knows to call for earlier follow up if she has any new or concerning symptoms - She will follow up again in 6 months with repeat mesenteric duplex  Karoline Caldwell, PA-C [facility] [phone]  Clinic MD:   [place]/ Donzetta Matters

## 2022-12-31 ENCOUNTER — Inpatient Hospital Stay: Payer: PPO

## 2022-12-31 VITALS — BP 125/62 | HR 78 | Temp 98.2°F | Resp 16

## 2022-12-31 DIAGNOSIS — D509 Iron deficiency anemia, unspecified: Secondary | ICD-10-CM | POA: Diagnosis not present

## 2022-12-31 MED ORDER — SODIUM CHLORIDE 0.9 % IV SOLN
Freq: Once | INTRAVENOUS | Status: AC
  Filled 2022-12-31: qty 250

## 2022-12-31 MED ORDER — SODIUM CHLORIDE 0.9 % IV SOLN
200.0000 mg | Freq: Once | INTRAVENOUS | Status: AC
  Administered 2022-12-31: 200 mg via INTRAVENOUS
  Filled 2022-12-31: qty 200

## 2023-01-01 ENCOUNTER — Other Ambulatory Visit: Payer: Self-pay

## 2023-01-01 DIAGNOSIS — K551 Chronic vascular disorders of intestine: Secondary | ICD-10-CM

## 2023-01-05 ENCOUNTER — Other Ambulatory Visit: Payer: Self-pay | Admitting: Family

## 2023-01-05 NOTE — Telephone Encounter (Signed)
Patient advised at appt on 12/28/22, that she no longer was taking. LV- 12/28/22 LR- 07/30/22 NV- not scheduled Ok to fill med?

## 2023-01-07 NOTE — Telephone Encounter (Signed)
Error

## 2023-01-10 ENCOUNTER — Other Ambulatory Visit: Payer: Self-pay | Admitting: Family

## 2023-01-10 DIAGNOSIS — F4323 Adjustment disorder with mixed anxiety and depressed mood: Secondary | ICD-10-CM

## 2023-01-18 ENCOUNTER — Other Ambulatory Visit: Payer: Self-pay | Admitting: Internal Medicine

## 2023-01-18 DIAGNOSIS — K2970 Gastritis, unspecified, without bleeding: Secondary | ICD-10-CM

## 2023-01-19 ENCOUNTER — Ambulatory Visit (INDEPENDENT_AMBULATORY_CARE_PROVIDER_SITE_OTHER): Payer: PPO | Admitting: Dermatology

## 2023-01-19 VITALS — BP 125/75 | HR 77

## 2023-01-19 DIAGNOSIS — Z85828 Personal history of other malignant neoplasm of skin: Secondary | ICD-10-CM

## 2023-01-19 DIAGNOSIS — L821 Other seborrheic keratosis: Secondary | ICD-10-CM | POA: Diagnosis not present

## 2023-01-19 DIAGNOSIS — L578 Other skin changes due to chronic exposure to nonionizing radiation: Secondary | ICD-10-CM | POA: Diagnosis not present

## 2023-01-19 DIAGNOSIS — L219 Seborrheic dermatitis, unspecified: Secondary | ICD-10-CM | POA: Diagnosis not present

## 2023-01-19 DIAGNOSIS — Z1283 Encounter for screening for malignant neoplasm of skin: Secondary | ICD-10-CM

## 2023-01-19 DIAGNOSIS — L304 Erythema intertrigo: Secondary | ICD-10-CM | POA: Diagnosis not present

## 2023-01-19 DIAGNOSIS — D692 Other nonthrombocytopenic purpura: Secondary | ICD-10-CM

## 2023-01-19 DIAGNOSIS — L814 Other melanin hyperpigmentation: Secondary | ICD-10-CM | POA: Diagnosis not present

## 2023-01-19 DIAGNOSIS — D229 Melanocytic nevi, unspecified: Secondary | ICD-10-CM

## 2023-01-19 DIAGNOSIS — L570 Actinic keratosis: Secondary | ICD-10-CM | POA: Diagnosis not present

## 2023-01-19 DIAGNOSIS — D1801 Hemangioma of skin and subcutaneous tissue: Secondary | ICD-10-CM

## 2023-01-19 MED ORDER — KETOCONAZOLE 2 % EX CREA: TOPICAL_CREAM | CUTANEOUS | 2 refills | Status: DC

## 2023-01-19 MED ORDER — HYDROCORTISONE 2.5 % EX CREA: TOPICAL_CREAM | CUTANEOUS | 2 refills | Status: AC

## 2023-01-19 NOTE — Patient Instructions (Addendum)
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 If improved, decrease to hydrocortisone and ketaconazole mixed once a day. If still clear, decrease to ketaconazole only.  Cryotherapy Aftercare  Wash gently with soap and water everyday.   Apply Vaseline and Band-Aid daily until healed.    Melanoma ABCDEs  Melanoma is the most dangerous type of skin cancer, and is the leading cause of death from skin disease.  You are more likely to develop melanoma if you: Have light-colored skin, light-colored eyes, or red or blond hair Spend a lot of time in the sun Tan regularly, either outdoors or in a tanning bed Have had blistering sunburns, especially during childhood Have a close family member who has had a melanoma Have atypical moles or large birthmarks  Early detection of melanoma is key since treatment is typically straightforward and cure rates are extremely high if we catch it early.   The first sign of melanoma is often a change in a mole or a new dark spot.  The ABCDE system is a way of remembering the signs of melanoma.  A for asymmetry:  The two halves do not match. B for border:  The edges of the growth are irregular. C for color:  A mixture of colors are present instead of an even brown color. D for diameter:  Melanomas are usually (but not always) greater than 79m - the size of a pencil eraser. E for evolution:  The spot keeps changing in size, shape, and color.  Please check your skin once per month between visits. You can use a small mirror in front and a large mirror behind you to keep an eye on the back side or your body.   If you see any new or changing lesions before your next follow-up, please call to schedule a visit.  Please continue daily skin protection including broad spectrum sunscreen SPF 30+ to sun-exposed areas, reapplying every 2 hours as needed when you're outdoors.   Staying in the  shade or wearing long sleeves, sun glasses (UVA+UVB protection) and wide brim hats (4-inch brim around the entire circumference of the hat) are also recommended for sun protection.     Due to recent changes in healthcare laws, you may see results of your pathology and/or laboratory studies on MyChart before the doctors have had a chance to review them. We understand that in some cases there may be results that are confusing or concerning to you. Please understand that not all results are received at the same time and often the doctors may need to interpret multiple results in order to provide you with the best plan of care or course of treatment. Therefore, we ask that you please give u[place]2 business days to thoroughly review all your results before contacting the office for clarification. Should we see a critical lab result, you will be contacted sooner.   If You Need Anything After Your Visit  If you have any questions or concerns for your doctor, please call our main line at [phone] press option 4 to reach your doctor's medical assistant. If no one answers, please leave a voicemail as directed and we will return your call as soon as possible. Messages left after 4 pm will be answered the following business day.   You may also send [place] message via [facility] We typically respond to MyChart messages within 1-2 business days.  For prescription refills, please ask your  pharmacy to contact our office. Our fax number is [phone].  If you have an urgent issue when the clinic is closed that cannot wait until the next business day, you can page your doctor at the number below.    Please note that while we do our best to be available for urgent issues outside of office hours, we are not available 24/7.   If you have an urgent issue and are unable to reach [place], you may choose to seek medical care at your doctor's office, retail clinic, urgent care center, or emergency room.  If you have a  medical emergency, please immediately call 911 or go to the emergency department.  Pager Numbers  - Dr. Nehemiah Massed: [phone]  - Dr. Laurence Ferrari: [phone]  - Dr. Nicole Kindred: [phone]  In the event of inclement weather, please call our main line at [phone] for an update on the status of any delays or closures.  Dermatology Medication Tips: Please keep the boxes that topical medications come in in order to help keep track of the instructions about where and how to use these. Pharmacies typically print the medication instructions only on the boxes and not directly on the medication tubes.   If your medication is too expensive, please contact our office at [phone] option 4 or send [place] a message through [place].   We are unable to tell what your co-pay for medications will be in advance as this is different depending on your insurance coverage. However, we may be able to find a substitute medication at lower cost or fill out paperwork to get insurance to cover a needed medication.   If a prior authorization is required to get your medication covered by your insurance company, please allow [place] 1-2 business days to complete this process.  Drug prices often vary depending on where the prescription is filled and some pharmacies may offer cheaper prices.  The website www.goodrx.com contains coupons for medications through different pharmacies. The prices here do not account for what the cost may be with help from insurance (it may be cheaper with your insurance), but the website can give you the price if you did not use any insurance.  - You can print the associated coupon and take it with your prescription to the pharmacy.  - You may also stop by our office during regular business hours and pick up a GoodRx coupon card.  - If you need your prescription sent electronically to a different pharmacy, notify our office through [facility] or by phone at [phone] option 4.     Si  Usted Necesita Algo Despus de Su Visita  Tambin puede enviarnos un mensaje a travs de Pharmacist, community. Por lo general respondemos a los mensajes de MyChart en el transcurso de 1 a 2 das hbiles.  Para renovar recetas, por favor pida a su farmacia que se ponga en contacto con nuestra oficina. Harland Dingwall de fax es Secretary [phone].  Si tiene un asunto urgente cuando la clnica est cerrada y que no puede esperar hasta el siguiente da hbil, puede llamar/localizar a su doctor(a) al nmero que aparece a continuacin.   Por favor, tenga en cuenta que aunque hacemos todo lo posible para estar disponibles para asuntos urgentes fuera del horario de [place], no estamos disponibles las 24 horas del da, los 7 das de la [place].   Si tiene un problema urgente y no puede comunicarse con nosotros, puede optar por buscar atencin mdica  en el consultorio de su doctor(a), en  una clnica privada, en un centro de atencin urgente o en una sala de emergencias.  Si tiene Engineering geologist, por favor llame inmediatamente al 911 o vaya a la sala de emergencias.  Nmeros de bper  - Dr. Nehemiah Massed: [phone]  - Dra. Moye: [phone]  - Dra. Nicole Kindred: [phone]  En caso de inclemencias del [place], por favor llame a Johnsie Kindred principal al [phone] para una actualizacin sobre el [place] de cualquier retraso o cierre.  Consejos para la medicacin en dermatologa: Por favor, guarde las cajas en las que vienen los medicamentos de uso tpico para ayudarle a seguir las instrucciones sobre dnde y cmo usarlos. Las farmacias generalmente imprimen las instrucciones del medicamento slo en las cajas y no directamente en los tubos del [place].   Si su medicamento es muy caro, por favor, pngase en contacto con Zigmund Daniel llamando al [phone] y presione la opcin 4 o envenos un mensaje a travs de Pharmacist, community.   No podemos decirle cul ser su copago por los medicamentos por adelantado ya que  esto es diferente dependiendo de la cobertura de su seguro. Sin embargo, es posible que podamos encontrar un medicamento sustituto a Electrical engineer un formulario para que el seguro cubra el medicamento que se considera necesario.   Si se requiere una autorizacin previa para que su compaa de seguros [place] su medicamento, por favor permtanos de 1 a 2 das hbiles para completar este proceso.  Los precios de los medicamentos varan con frecuencia dependiendo del Environmental consultant de dnde se surte la receta y alguna farmacias pueden ofrecer precios ms baratos.  El sitio web www.goodrx.com tiene cupones para medicamentos de Airline pilot. Los precios aqu no tienen en cuenta lo que podra costar con la ayuda del seguro (puede ser ms barato con su seguro), pero el sitio web puede darle el precio si no utiliz Research scientist (physical sciences).  - Puede imprimir el cupn correspondiente y llevarlo con su receta a la farmacia.  - Tambin puede pasar por nuestra oficina durante el horario de atencin regular y Charity fundraiser una tarjeta de cupones de GoodRx.  - Si necesita que su receta se enve electrnicamente a una farmacia diferente, informe a nuestra oficina a travs de MyChart de [place] o por telfono llamando al [phone] y presione la opcin 4.

## 2023-01-19 NOTE — Progress Notes (Signed)
Follow-Up Visit   Subjective  Alicia [name] is a 77 y.o. female who presents for the following: TBSE.  The patient presents for Total-Body Skin Exam (TBSE) for skin cancer screening and mole check.  The patient has spots, moles and lesions to be evaluated, some may be new or changing. She has a history of SCC of the right mid forearm.  She has a scaly  spot on her hand, she also gets a rash under her L breast.  The following portions of the chart were reviewed this encounter and updated as appropriate:       Review of Systems:  No other skin or systemic complaints except as noted in HPI or Assessment and Plan.  Objective  Well appearing patient in no apparent distress; mood and affect are within normal limits.  A full examination was performed including scalp, head, eyes, ears, nose, lips, neck, chest, axillae, abdomen, back, buttocks, bilateral upper extremities, bilateral lower extremities, hands, feet, fingers, toes, fingernails, and toenails. All findings within normal limits unless otherwise noted below.  Left Lateral Mid Back 2.5 mm med dark brown macule    Right Mid Back 4.0 mm speckled brown macule  Right mid forearm Well healed scar with no evidence of recurrence.   Left hand dorsum Keratotic papule  Left Chest 8.0 x 5.0 mm med dark brown waxy macule, has been there a long time, no changes       Left Inframammary Fold Erythema of the left inframammary.  BL nasal alar crease Mild erythema    Assessment & Plan  Skin cancer screening performed today.  Actinic Damage - chronic, secondary to cumulative UV radiation exposure/sun exposure over time - diffuse scaly erythematous macules with underlying dyspigmentation - Recommend daily broad spectrum sunscreen SPF 30+ to sun-exposed areas, reapply every 2 hours as needed.  - Recommend staying in the shade or wearing long sleeves, sun glasses (UVA+UVB protection) and wide brim hats (4-inch brim around  the entire circumference of the hat). - Call for new or changing lesions.  Lentigines - Scattered tan macules - Due to sun exposure - Benign-appearing, observe - Recommend daily broad spectrum sunscreen SPF 30+ to sun-exposed areas, reapply every 2 hours as needed. - Call for any changes  Seborrheic Keratoses - Stuck-on, waxy, tan-brown papules and/or plaques  - Benign-appearing - Discussed benign etiology and prognosis. - Observe - Call for any changes  Purpura - Chronic; persistent and recurrent.  Treatable, but not curable. - Violaceous macules and patches - Benign - Related to trauma, age, sun damage and/or use of blood thinners, chronic use of topical and/or oral steroids - Observe - Can use OTC arnica containing moisturizer such as Dermend Bruise Formula if desired - Call for worsening or other concerns  Melanocytic Nevi - Tan-brown and/or pink-flesh-colored symmetric macules and papules - Benign appearing on exam today - Observation - Call clinic for new or changing moles - Recommend daily use of broad spectrum spf 30+ sunscreen to sun-exposed areas.   Hemangiomas - Red papules - Discussed benign nature - Observe - Call for any changes  Nevus (2) Left Lateral Mid Back; Right Mid Back  Benign-appearing.  Observation.  Call clinic for new or changing moles.  Recommend daily use of broad spectrum spf 30+ sunscreen to sun-exposed areas.   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 Observe for recurrence. Call clinic for new or changing lesions.  Recommend regular skin exams, daily broad-spectrum spf 30+  sunscreen use, and photoprotection.    Hypertrophic actinic keratosis Left hand dorsum  Actinic keratoses are precancerous spots that appear secondary to cumulative UV radiation exposure/sun exposure over time. They are chronic with expected duration over 1 year. A portion of actinic keratoses will progress to squamous cell  carcinoma of the skin. It is not possible to reliably predict which spots will progress to skin cancer and so treatment is recommended to prevent development of skin cancer.  Recommend daily broad spectrum sunscreen SPF 30+ to sun-exposed areas, reapply every 2 hours as needed.  Recommend staying in the shade or wearing long sleeves, sun glasses (UVA+UVB protection) and wide brim hats (4-inch brim around the entire circumference of the hat). Call for new or changing lesions.  Destruction of lesion - Left hand dorsum  Destruction method: cryotherapy   Informed consent: discussed and consent obtained   Lesion destroyed using liquid nitrogen: Yes   Region frozen until ice ball extended beyond lesion: Yes   Outcome: patient tolerated procedure well with no complications   Post-procedure details: wound care instructions given   Additional details:  Prior to procedure, discussed risks of blister formation, small wound, skin dyspigmentation, or rare scar following cryotherapy. Recommend Vaseline ointment to treated areas while healing.   Seborrheic keratosis Left Chest  Recheck on f/u.  Reassured benign age-related growth.  Recommend observation.  Discussed cryotherapy if spot(s) become irritated or inflamed.  Erythema intertrigo Left Inframammary Fold  Chronic and persistent condition with duration or expected duration over one year. Condition is symptomatic/ bothersome to patient. Not currently at goal.   Intertrigo is a chronic recurrent rash that occurs in skin fold areas that may be associated with friction; heat; moisture; yeast; fungus; and bacteria.  It is exacerbated by increased movement / activity; sweating; and higher atmospheric temperature.  Mix hydrocortisone with ketaconazole 2% twice a day. If improved, decrease to hydrocortisone and ketaconazole mixed once a day. If still clear, decrease to ketaconazole only.  Seborrheic dermatitis BL nasal alar crease  Chronic  condition with duration or expected duration over one year. Currently well-controlled.   Seborrheic Dermatitis  -  is a chronic persistent rash characterized by pinkness and scaling most commonly of the mid face but also can occur on the scalp (dandruff), ears; mid chest, mid back and groin.  It tends to be exacerbated by stress and cooler weather.  People who have neurologic disease may experience new onset or exacerbation of existing seborrheic dermatitis.  The condition is not curable but treatable and can be controlled.  Mix hydrocortisone with ketaconazole 2% twice a day. If improved, decrease to hydrocortisone and ketaconazole mixed once a day. If still clear, decrease to ketaconazole only.  hydrocortisone 2.5 % cream - BL nasal alar crease Apply to rash on face and under breasts once to twice daily as directed.  ketoconazole (NIZORAL) 2 % cream - BL nasal alar crease Apply to rash on face and under breasts once to twice daily as directed.   Return in about 6 months (around 07/22/2023) for recheck marked moles and SK, AK.  I, Jamesetta Orleans, CMA, am acting as scribe for Brendolyn Patty, MD .  Documentation: I have reviewed the above documentation for accuracy and completeness, and I agree with the above.  Brendolyn Patty MD

## 2023-01-21 DIAGNOSIS — H9202 Otalgia, left ear: Secondary | ICD-10-CM | POA: Diagnosis not present

## 2023-01-21 DIAGNOSIS — M26622 Arthralgia of left temporomandibular joint: Secondary | ICD-10-CM | POA: Diagnosis not present

## 2023-01-28 ENCOUNTER — Other Ambulatory Visit: Payer: Self-pay | Admitting: Family

## 2023-01-28 ENCOUNTER — Ambulatory Visit: Payer: PPO | Attending: Internal Medicine

## 2023-01-28 DIAGNOSIS — J4489 Other specified chronic obstructive pulmonary disease: Secondary | ICD-10-CM | POA: Diagnosis not present

## 2023-01-28 DIAGNOSIS — J439 Emphysema, unspecified: Secondary | ICD-10-CM | POA: Insufficient documentation

## 2023-01-28 DIAGNOSIS — F17210 Nicotine dependence, cigarettes, uncomplicated: Secondary | ICD-10-CM | POA: Diagnosis not present

## 2023-01-28 LAB — PULMONARY FUNCTION TEST ARMC ONLY
DL/VA % pred: 41 %
DL/VA: 1.73 ml/min/mmHg/L
DLCO unc % pred: 47 %
DLCO unc: 7.83 ml/min/mmHg
FEF 25-75 Post: 2.43 L/sec
FEF 25-75 Pre: 1.48 L/sec
FEF2575-%Change-Post: 63 %
FEF2575-%Pred-Post: 181 %
FEF2575-%Pred-Pre: 110 %
FEV1-%Change-Post: 16 %
FEV1-%Pred-Post: 120 %
FEV1-%Pred-Pre: 103 %
FEV1-Post: 2.02 L
FEV1-Pre: 1.73 L
FEV1FVC-%Change-Post: -1 %
FEV1FVC-%Pred-Pre: 104 %
FEV6-%Change-Post: 18 %
FEV6-%Pred-Post: 123 %
FEV6-%Pred-Pre: 103 %
FEV6-Post: 2.62 L
FEV6-Pre: 2.22 L
FEV6FVC-%Change-Post: 0 %
FEV6FVC-%Pred-Post: 105 %
FEV6FVC-%Pred-Pre: 105 %
FVC-%Change-Post: 18 %
FVC-%Pred-Post: 116 %
FVC-%Pred-Pre: 98 %
FVC-Post: 2.62 L
FVC-Pre: 2.22 L
Post FEV1/FVC ratio: 77 %
Post FEV6/FVC ratio: 100 %
Pre FEV1/FVC ratio: 78 %
Pre FEV6/FVC Ratio: 100 %
RV % pred: 115 %
RV: 2.45 L
TLC % pred: 116 %
TLC: 5.19 L

## 2023-01-28 MED ORDER — ALBUTEROL SULFATE (2.5 MG/3ML) 0.083% IN NEBU
2.5000 mg | INHALATION_SOLUTION | Freq: Once | RESPIRATORY_TRACT | Status: AC
  Administered 2023-01-28: 2.5 mg via RESPIRATORY_TRACT

## 2023-01-28 NOTE — Telephone Encounter (Signed)
Left message to return call to our office.  

## 2023-01-28 NOTE — Telephone Encounter (Signed)
Refill request for albuterol (VENTOLIN HFA) 108 (90 Base) MCG/ACT inhaler   LV- 12/28/22 LR- 01/05/23 ( 1/ no refills) NV- not scheduled

## 2023-01-28 NOTE — Progress Notes (Signed)
Alicia [name], female    DOB: [birth year]    MRN: [id number]   Brief patient profile:  64 yowf  active smoker with croup / whooping cough and pna last around 10-11  referred to pulmonary clinic in [facility]  02/05/2022 by  [name] NP  for ? Copd s/p hairdressor @ [facility] stopped in 2012    and moved to [place] and downhill since with breathing  and needing daily inhaler since 2021.  [facility] eval around 2021  "lung function great"   History of Present Illness  02/05/2022  Pulmonary/ 1st office eval/ Aleksander Edmiston / [facility]  Chief Complaint  Patient presents with   pulmonary consult    Hx of covid 10/2021--c/o sob with exertion, prod cough with clear sputum and wheezing.   Dyspnea:  walks slowly with cane / takes care of yard / iron rx has helped her breathing of late Cough: comes and goes/ min mucoid  Sleep: flat bed one pillow/ cpap x 2003 [place]  SABA use: avg 3 daily  Rec Plan A = Automatic = Always=    Breztri Take 2 puffs first thing in am and then another 2 puffs about 12 hours later.   Work on inhaler technique:  Plan B = Backup (to supplement plan A, not to replace it) Only use your albuterol inhaler as a rescue medicatin Ok to try albuterol 15 min before an activity (on alternating days)  that you know would usually make you short of breath The key is to stop smoking completely before smoking completely stops you!   Please schedule a follow up office visit in 6 weeks, call sooner if needed with all medications /inhalers/ solutions in hand     04/02/2022  f/u ov/Dontee Jaso/ [facility] re: AB   maint on Breztri  one bid  / still smoking/ needs sma/celiac bypass may try stenting  Chief Complaint  Patient presents with   Follow-up    Patient feels like she is doing good, Judithann Sauger has really helped. No concerns   Dyspnea:  back doing yardwork, walks to [place] and stops sometimes half way  Cough: no am flares / min am rattle  Sleeping: flat bed on  cpap SABA use: none  02: none  Covid status:  3 vax  and xmas omicron Rec No change in medications  - ok to try taking both Breztri doses  in am if needed for yardwork Also Ok to try albuterol 15 min before an activity (on alternating days)  that you know would usually make you short of breath   Please schedule a follow up visit in 6 months but call sooner if needed with pfts in meantime    01/29/2023  f/u ov/Dawsyn Ramsaran/ [facility] re: AB   maint on breztri 2 in am / breztri one in am  and prn saba Chief Complaint  Patient presents with   Follow-up    SOB with exertion. Wheezing. Cough with white sputum.   Dyspnea:  yard work x last 8 weeks prior to OV  using  avg amt of albuterol to get thru it but is not typically pre challenging or rechallengning to see if it really helps as rec  Cough: white/ min in am   Sleeping: on cpap / autoset full mask full position gets 5 h / night due to niece   02: none  Lung cancer sreening  q April    No obvious day to day or daytime variability or  assoc  purulent sputum or mucus plugs or hemoptysis or cp or chest tightness, subjective wheeze or overt sinus or hb symptoms.   Sleeping as above  without nocturnal  or early am exacerbation  of respiratory  c/o's or need for noct saba. Also denies any obvious fluctuation of symptoms with weather or environmental changes or other aggravating or alleviating factors except as outlined above   No unusual exposure hx or h/o childhood pna/ asthma or knowledge of premature birth.  Current Allergies, Complete Past Medical History, Past Surgical History, Family History, and Social History were reviewed in [employer] record.  ROS  The following are not active complaints unless bolded Hoarseness, sore throat, dysphagia, dental problems, itching, sneezing,  nasal congestion or discharge of excess mucus or purulent secretions, ear ache,   fever, chills, sweats, unintended wt loss or wt gain,  classically pleuritic or exertional cp,  orthopnea pnd or arm/hand swelling  or leg swelling, presyncope, palpitations, abdominal pain, anorexia, nausea, vomiting, diarrhea  or change in bowel habits or change in bladder habits, change in stools or change in urine, dysuria, hematuria,  rash, arthralgias, visual complaints, headache, numbness, weakness or ataxia or problems with walking or coordination,  change in mood or  memory.        Current Meds  Medication Sig   acetaminophen (TYLENOL) 500 MG tablet Take 1,000 mg by mouth every 6 (six) hours as needed for moderate pain.   albuterol (VENTOLIN HFA) 108 (90 Base) MCG/ACT inhaler INHALE 1-2 PUFFS INTO THE LUNGS 4 (FOUR) TIMES DAILY AS NEEDED FOR SHORTNESS OF BREATH OR WHEEZING.   aspirin EC 81 MG tablet Take 81 mg by mouth every other day. Swallow whole.   Budeson-Glycopyrrol-Formoterol (BREZTRI AEROSPHERE) 160-9-4.8 MCG/ACT AERO Inhale 2 puffs into the lungs 2 (two) times daily.   Cholecalciferol (VITAMIN D3) 125 MCG (5000 UT) CAPS Take 5,000 Units by mouth in the morning.   faricimab-svoa (VABYSMO) 6 MG/0.05ML SOLN intravitreal injection 6 mg by Intravitreal route every 8 (eight) weeks.   gabapentin (NEURONTIN) 300 MG capsule TAKE 1 CAPSULE BY MOUTH THREE TIMES A DAY   hydrocortisone 2.5 % cream Apply to rash on face and under breasts once to twice daily as directed.   ketoconazole (NIZORAL) 2 % cream Apply to rash on face and under breasts once to twice daily as directed.   Lutein 20 MG TABS Take 20 mg by mouth in the morning.   Magnesium 400 MG TABS Take 400 mg by mouth daily.   omeprazole (PRILOSEC) 40 MG capsule TAKE 1 CAPSULE (40 MG TOTAL) BY MOUTH DAILY.   pramipexole (MIRAPEX) 0.5 MG tablet TAKE 1 TABLET BY MOUTH TWICE A DAY   rosuvastatin (CRESTOR) 10 MG tablet Take 1 tablet (10 mg total) by mouth daily.   venlafaxine XR (EFFEXOR-XR) 75 MG 24 hr capsule TAKE 1 CAPSULE BY MOUTH DAILY WITH BREAKFAST.             Past Medical  History:  Diagnosis Date   Carotid artery occlusion    History of hiatal hernia    Macular degeneration    Sleep apnea          Objective:    Wts   01/29/2023       192    04/02/22 206 lb 6.4 oz (93.6 kg)  03/25/22 202 lb 11.2 oz (91.9 kg)  03/18/22 206 lb (93.4 kg)    Vital signs reviewed  01/29/2023  - Note at rest 02 sats  95% on RA   General appearance:    Amb wf slt smoker's rattle    HEENT : Oropharynx  clear     NECK :  without  apparent JVD/ palpable Nodes/TM    LUNGS: no acc muscle use,  Min barrel  contour chest wall with bilateral  slightly decreased bs s audible wheeze and  without cough on insp or exp maneuvers and min  Hyperresonant  to  percussion bilaterally    CV:  RRR  no s3 or murmur or increase in P2, and no edema   ABD:  soft and nontender with pos end  insp Hoover's  in the supine position.  No bruits or organomegaly appreciated   MS:  Nl gait/ ext warm without deformities Or obvious joint restrictions  calf tenderness, cyanosis or clubbing    SKIN: warm and dry without lesions    NEURO:  alert, approp, nl sensorium with  no motor or cerebellar deficits apparent.                    I personally reviewed images and agree with radiology impression as follows:   Chest LDSCT    03/02/22 1. Lung-RADS 2, benign appearance or behavior. Continue annual screening with low-dose chest CT without contrast in 12 months. 2. Coronary artery calcifications. 3. Aortic Atherosclerosis (ICD10-I70.0) and Emphysema (ICD10-J43.9)       Assessment

## 2023-01-28 NOTE — Telephone Encounter (Signed)
Please ask pt how often she is using albuterol inhaler, we only just filled this 3 weeks ago.

## 2023-01-29 ENCOUNTER — Ambulatory Visit (INDEPENDENT_AMBULATORY_CARE_PROVIDER_SITE_OTHER): Payer: PPO | Admitting: Internal Medicine

## 2023-01-29 ENCOUNTER — Encounter: Payer: Self-pay | Admitting: Internal Medicine

## 2023-01-29 VITALS — BP 130/82 | HR 64 | Temp 98.0°F | Ht 60.0 in | Wt 192.0 lb

## 2023-01-29 DIAGNOSIS — J4489 Other specified chronic obstructive pulmonary disease: Secondary | ICD-10-CM | POA: Diagnosis not present

## 2023-01-29 DIAGNOSIS — F17210 Nicotine dependence, cigarettes, uncomplicated: Secondary | ICD-10-CM | POA: Diagnosis not present

## 2023-01-29 NOTE — Assessment & Plan Note (Addendum)
Active smoker - 02/05/2022    try breztri 2bid - 04/02/2022  After extensive coaching inhaler device,  effectiveness =   80% (short Ti)  - PFT's  01/28/23  FEV1 2.02 (120 % ) ratio 0.77  p 16 % improvement from saba p 0 prior to study with    and FV curve very min concavity with variable truncation of insp loop   She does not meet criteria for copd but has done great on breztr 2bid which more than adequate for the reversible component of her AB so no changes needed for now  I did repeat: Also  Ok to try albuterol 15 min before an activity (on alternating days)  that you know would usually make you short of breath and see if it makes any difference and if makes none then don't take albuterol after activity unless you can't catch your breath as this means it's the resting that helps, not the albuterol.

## 2023-01-29 NOTE — Assessment & Plan Note (Addendum)
Counseled re importance of smoking cessation but did not meet time criteria for separate billing      Each maintenance medication was reviewed in detail including emphasizing most importantly the difference between maintenance and prns and under what circumstances the prns are to be triggered using an action plan format where appropriate.  Total time for H and P, chart review, counseling, reviewing hfa device(s) and generating customized AVS unique to this office visit / same day charting = 35 mn

## 2023-01-29 NOTE — Patient Instructions (Signed)
Breztri Take 2 puffs first thing in am and then  the pm 2 puffs are both optional   Only use your albuterol as a rescue medication to be used if you can't catch your breath by resting or doing a relaxed purse lip breathing pattern.  - The less you use it, the better it will work when you need it. - Ok to use up to 2 puffs  every 4 hours if you must but call for immediate appointment if use goes up over your usual need - Don't leave home without it !!  (think of it like the spare tire for your car)   Also  Ok to try albuterol 15 min before an activity (on alternating days)  that you know would usually make you short of breath and see if it makes any difference and if makes none then don't take albuterol after activity unless you can't catch your breath as this means it's the resting that helps, not the albuterol.       Please schedule a follow up visit in 6  months but call sooner if needed

## 2023-02-02 ENCOUNTER — Other Ambulatory Visit: Payer: Self-pay | Admitting: Family

## 2023-02-02 DIAGNOSIS — Z1231 Encounter for screening mammogram for malignant neoplasm of breast: Secondary | ICD-10-CM

## 2023-02-03 NOTE — Telephone Encounter (Signed)
Spoke with the patient and was advised that she was doing 1-2 puffs 4 times per day because she was wheezing and having SOB from doing yard work. She recently seen her pulmonologist last week and was advised to cut back to 1-2 puffs twice daily.

## 2023-02-06 ENCOUNTER — Other Ambulatory Visit: Payer: Self-pay | Admitting: Surgical

## 2023-02-12 ENCOUNTER — Telehealth: Payer: Self-pay | Admitting: Family

## 2023-02-12 NOTE — Telephone Encounter (Signed)
Type of forms received:Parking Placaed  Routed ZL:[id number] Pool  Paperwork received by : Lesly Rubenstein   Individual made aware of 3-5 business day turn around (Y/N): Y  Form completed and patient made aware of charges(Y/N): Y   Faxed to :   Form location: Place in PCP folder

## 2023-02-12 NOTE — Telephone Encounter (Signed)
Form received and placed in Tabitha's box in her office.

## 2023-02-16 NOTE — Telephone Encounter (Signed)
Left message to return call to our office.  

## 2023-02-16 NOTE — Telephone Encounter (Signed)
Patient called in returning a call

## 2023-02-16 NOTE — Telephone Encounter (Signed)
Spoke with patient and advised the form is ready for pick up at the front desk.

## 2023-02-16 NOTE — Telephone Encounter (Signed)
This is completed and in outbox.

## 2023-02-26 DIAGNOSIS — H353232 Exudative age-related macular degeneration, bilateral, with inactive choroidal neovascularization: Secondary | ICD-10-CM | POA: Diagnosis not present

## 2023-02-26 DIAGNOSIS — H43813 Vitreous degeneration, bilateral: Secondary | ICD-10-CM | POA: Diagnosis not present

## 2023-02-26 DIAGNOSIS — H35423 Microcystoid degeneration of retina, bilateral: Secondary | ICD-10-CM | POA: Diagnosis not present

## 2023-03-02 ENCOUNTER — Encounter: Payer: Self-pay | Admitting: Orthopedic Surgery

## 2023-03-02 ENCOUNTER — Other Ambulatory Visit (INDEPENDENT_AMBULATORY_CARE_PROVIDER_SITE_OTHER): Payer: PPO

## 2023-03-02 ENCOUNTER — Ambulatory Visit (INDEPENDENT_AMBULATORY_CARE_PROVIDER_SITE_OTHER): Payer: PPO | Admitting: Orthopedic Surgery

## 2023-03-02 DIAGNOSIS — M25512 Pain in left shoulder: Secondary | ICD-10-CM | POA: Diagnosis not present

## 2023-03-02 DIAGNOSIS — M545 Low back pain, unspecified: Secondary | ICD-10-CM | POA: Diagnosis not present

## 2023-03-02 DIAGNOSIS — M25511 Pain in right shoulder: Secondary | ICD-10-CM

## 2023-03-02 DIAGNOSIS — M25551 Pain in right hip: Secondary | ICD-10-CM

## 2023-03-02 DIAGNOSIS — M25552 Pain in left hip: Secondary | ICD-10-CM | POA: Diagnosis not present

## 2023-03-02 NOTE — Progress Notes (Signed)
Office Visit Note   Patient: Alicia [name]           Date of Birth: 1946-07-27           MRN: [id number] Visit Date: 03/02/2023 Requested by: Mort Sawyers, FNP [address] [place],  [place] [address] PCP: Mort Sawyers, FNP  Subjective: Chief Complaint  Patient presents with   Other    Fall on 02/25/23 Bilateral hip/back and bilateral shoulder pain s/p fall     HPI: Alicia [name] is a 77 y.o. female who presents to the office reporting multiple joint complaints following a fall.  She reports bilateral shoulder pain bilateral hip pain some lumbar back pain as well as some left ankle pain.  She fell while doing yard work on 02/25/2023.  Describes some pain with ambulation but she is using a cane.  She states "I am sore all over".  Taking Tylenol for symptoms.  Also takes aspirin every other day..                ROS: All systems reviewed are negative as they relate to the chief complaint within the history of present illness.  Patient denies fevers or chills.  Assessment & Plan: Visit Diagnoses:  1. Bilateral shoulder pain, unspecified chronicity   2. Bilateral hip pain   3. Low back pain, unspecified back pain laterality, unspecified chronicity, unspecified whether sciatica present     Plan: Impression is bilateral shoulder pain with no fracture and arthritis in the left shoulder and located reverse replacement on the right.  No intervention required for the shoulders.  She may still continue with left reverse replacement in July.  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  No acute fracture.  Lumbar spine may show a little bit of compression deformity at L2-3 and.  This is compared to prior radiographs from 2022.  Need MRI scan to evaluate new L2-3 compression fracture and back brace.  Follow-up after that study.  Also complaining of ankle pain but she has a history of fracture in the ankle and that  is moving reasonably well at this time.  Follow-Up Instructions: No follow-ups on file.   Orders:  Orders Placed This Encounter  Procedures   XR Shoulder Left   XR Shoulder Right   XR Lumbar Spine 2-3 Views   XR HIPS BILAT W OR W/O PELVIS 3-4 VIEWS   No orders of the defined types were placed in this encounter.     Procedures: No procedures performed   Clinical Data: No additional findings.  Objective: Vital Signs: There were no vitals taken for this visit.  Physical Exam:  Constitutional: Patient appears well-developed HEENT:  Head: Normocephalic Eyes:EOM are normal Neck: Normal range of motion Cardiovascular: Normal rate Pulmonary/chest: Effort normal Neurologic: Patient is alert Skin: Skin is warm Psychiatric: Patient has normal mood and affect  Ortho Exam: Ortho exam demonstrates excellent range of motion of the right shoulder with forward flexion and abduction both above 90 degrees.  No tenderness over the acromion.  Left shoulder demonstrates diminished shoulder range of motion but the shoulder is located.  Has known history of arthritis.  No bruising or ecchymosis in that left shoulder girdle region.  No AC joint tenderness.  No groin pain on the left or right-hand side with internal/external rotation of either hip.  Has excellent hip flexion strength and no nerve root tension signs.  Left ankle demonstrates  palpable intact nontender anterior to posterior to peroneal Achilles tendons with some chronic swelling but no acute bruising or ecchymosis.  Does also report some mild low back pain tenderness right above the iliac crest bilaterally.  Specialty Comments:  No specialty comments available.  Imaging: No results found.   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   Centrilobular emphysema 02/13/2022   Cigarette smoker 02/06/2022   Asthmatic bronchitis , chronic 02/05/2022   Incidental pulmonary nodule, greater than or equal to 8mm 01/21/2022   Bilateral carotid artery stenosis 12/26/2021   Iron deficiency anemia 12/26/2021   Mixed hyperlipidemia 12/26/2021   Chronic cough 12/26/2021   Adjustment reaction with anxiety and depression 12/26/2021   Chronic venous insufficiency 02/29/2020   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/05/2019   Arthritis of shoulder 06/08/2018   GERD (gastroesophageal reflux disease) 06/08/2018   Macular degeneration 06/08/2018   Sleep apnea 06/08/2018   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   COPD with acute exacerbation (HCC) 12/26/2021   Depression    GERD (gastroesophageal reflux disease)    History of hiatal hernia    Macular degeneration    Positive fecal occult blood test 01/19/2022   Sleep apnea    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   R mid forearm, [facility]    Family History  Problem Relation Age of Onset   Heart disease Mother    Cerebral aneurysm Father     Past Surgical History:  Procedure Laterality Date   APPENDECTOMY     CELIAC ARTERY STENT     CHOLECYSTECTOMY  10/2016   ENTEROSCOPY N/A 12/17/2022   Procedure: ENTEROSCOPY;  Surgeon: Imogene Burn, MD;  Location: [facility];  Service: Gastroenterology;  Laterality: N/A;  small bowel enteroscopy   EYE SURGERY Bilateral 2017   cataracts removed   HOT HEMOSTASIS  12/17/2022   Procedure: HOT HEMOSTASIS (ARGON PLASMA COAGULATION/BICAP);  Surgeon: Imogene Burn, MD;  Location: [facility];  Service: Gastroenterology;;   LUMBAR LAMINECTOMY/DECOMPRESSION MICRODISCECTOMY Left 07/21/2021   Procedure: Laminectomy and Foraminotomy - L4-L5 - left;  Surgeon: Donalee Citrin, MD;  Location: [facility] OR;  Service: Neurosurgery;  Laterality: Left;  3C   PAROTID GLAND TUMOR EXCISION Left 2003   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   Procedure: PERIPHERAL VASCULAR INTERVENTION;  Surgeon: Maeola Harman, MD;  Location: [facility] INVASIVE CV LAB;  Service: Cardiovascular;;  celiac   TOTAL SHOULDER ARTHROPLASTY Right 08/06/2022   Procedure: RIGHT REVERSE SHOULDER ARTHROPLASTY;  Surgeon: Cammy Copa, MD;  Location: [facility] OR;  Service: Orthopedics;  Laterality: Right;   VISCERAL ANGIOGRAPHY N/A 05/18/2022   Procedure: VISCERAL ANGIOGRAPHY;  Surgeon: Maeola Harman, MD;  Location: [facility] INVASIVE CV LAB;  Service: Cardiovascular;  Laterality: N/A;   Social History   Occupational History   Not on file  Tobacco Use   Smoking status: Every Day  Packs/day: 1.50    Years: 62.00    Additional pack years: 0.00    Total pack years: 93.00    Types: Cigarettes    Passive exposure: Current   Smokeless tobacco: Never   Tobacco comments:    Smokes 1- 1.5 PPD 01/29/2023  Vaping Use   Vaping Use: Never used  Substance and Sexual Activity   Alcohol use: Not Currently   Drug use: Never   Sexual activity: Not Currently

## 2023-03-04 ENCOUNTER — Telehealth: Payer: Self-pay | Admitting: Orthopedic Surgery

## 2023-03-04 NOTE — Telephone Encounter (Signed)
Pt called stating that the back brace script for pt Dr August Saucer gives to her no longer carries over the waist braces. Pt states she need script to new facility that give L3 compression back brace. Please call pt at [phone].

## 2023-03-05 ENCOUNTER — Other Ambulatory Visit: Payer: Self-pay | Admitting: Internal Medicine

## 2023-03-05 NOTE — Telephone Encounter (Signed)
I spoke with patient. [facility] no longer has back brace. She tried her nieces brace one day this week (she had previous compression fracture as well) and states that she could not stand that on her abdomen. She has a hiatal hernia and some abdominal problems and does not know that she can wear that type of brace. Is there something else that you recommend?

## 2023-03-08 ENCOUNTER — Telehealth: Payer: Self-pay | Admitting: Orthopedic Surgery

## 2023-03-08 NOTE — Telephone Encounter (Signed)
Pt called requesting a call back about brace. Also states Dr August Saucer was to send referral for MRI. Please call pt about this matter at [phone].

## 2023-03-09 NOTE — Telephone Encounter (Signed)
Patient called in again about Back Brace and MRI referral because she is still hurting in her hips please advise Tamela Oddi already spoke with Patient per previous notes but no answer from [facility] yet

## 2023-03-10 ENCOUNTER — Ambulatory Visit: Payer: PPO | Admitting: Orthopedic Surgery

## 2023-03-11 NOTE — Telephone Encounter (Signed)
Pt is scheduled for MRI on 05/12

## 2023-03-15 ENCOUNTER — Encounter: Payer: Self-pay | Admitting: Orthopedic Surgery

## 2023-03-15 DIAGNOSIS — M25551 Pain in right hip: Secondary | ICD-10-CM

## 2023-03-17 NOTE — Telephone Encounter (Signed)
Lauren- I see that patient has been spoken to a few other times since this message was originally sent.  Do I still need to speak with her regarding back brace? I will be glad to call her.

## 2023-03-17 NOTE — Telephone Encounter (Signed)
Patient has been notified

## 2023-03-17 NOTE — Telephone Encounter (Signed)
If she cannot wear the brace then there is really nothing else to recommend.  Thanks

## 2023-03-17 NOTE — Telephone Encounter (Signed)
Mri ordered 

## 2023-03-19 ENCOUNTER — Ambulatory Visit
Admission: RE | Admit: 2023-03-19 | Discharge: 2023-03-19 | Disposition: A | Discharge status: Home / Self Care | Payer: PPO | Source: Ambulatory Visit | Attending: Family | Admitting: Family

## 2023-03-19 DIAGNOSIS — Z1231 Encounter for screening mammogram for malignant neoplasm of breast: Secondary | ICD-10-CM | POA: Diagnosis not present

## 2023-03-21 ENCOUNTER — Ambulatory Visit
Admission: RE | Admit: 2023-03-21 | Discharge: 2023-03-21 | Disposition: A | Discharge status: Home / Self Care | Payer: PPO | Source: Ambulatory Visit | Attending: Orthopedic Surgery | Admitting: Orthopedic Surgery

## 2023-03-21 DIAGNOSIS — M9963 Osseous and subluxation stenosis of intervertebral foramina of lumbar region: Secondary | ICD-10-CM | POA: Diagnosis not present

## 2023-03-21 DIAGNOSIS — M545 Low back pain, unspecified: Secondary | ICD-10-CM

## 2023-03-21 DIAGNOSIS — M25559 Pain in unspecified hip: Secondary | ICD-10-CM | POA: Diagnosis not present

## 2023-03-21 DIAGNOSIS — M25551 Pain in right hip: Secondary | ICD-10-CM

## 2023-03-21 DIAGNOSIS — M7600 Gluteal tendinitis, unspecified hip: Secondary | ICD-10-CM | POA: Diagnosis not present

## 2023-03-24 ENCOUNTER — Telehealth: Payer: Self-pay | Admitting: Vascular Surgery

## 2023-03-25 ENCOUNTER — Ambulatory Visit (INDEPENDENT_AMBULATORY_CARE_PROVIDER_SITE_OTHER): Payer: PPO | Admitting: Surgical

## 2023-03-25 ENCOUNTER — Other Ambulatory Visit: Payer: Self-pay

## 2023-03-25 DIAGNOSIS — M1611 Unilateral primary osteoarthritis, right hip: Secondary | ICD-10-CM

## 2023-03-25 DIAGNOSIS — M545 Low back pain, unspecified: Secondary | ICD-10-CM

## 2023-03-25 MED ORDER — CALCITONIN (SALMON) 200 UNIT/ACT NA SOLN: 1.0000 | Freq: Every day | NASAL | 0 refills | Status: DC

## 2023-03-28 ENCOUNTER — Encounter: Payer: Self-pay | Admitting: Orthopedic Surgery

## 2023-03-28 MED ORDER — METHYLPREDNISOLONE ACETATE 40 MG/ML IJ SUSP
40.0000 mg | INTRAMUSCULAR | Status: AC | PRN
Start: 2023-03-25 — End: 2023-03-25
  Administered 2023-03-25: 40 mg via INTRA_ARTICULAR

## 2023-03-28 MED ORDER — BUPIVACAINE HCL 0.25 % IJ SOLN
4.0000 mL | INTRAMUSCULAR | Status: AC | PRN
Start: 2023-03-25 — End: 2023-03-25
  Administered 2023-03-25: 4 mL via INTRA_ARTICULAR

## 2023-03-28 MED ORDER — LIDOCAINE HCL 1 % IJ SOLN
5.0000 mL | INTRAMUSCULAR | Status: AC | PRN
Start: 2023-03-25 — End: 2023-03-25
  Administered 2023-03-25: 5 mL

## 2023-03-28 NOTE — Progress Notes (Signed)
Office Visit Note   Patient: Alicia [name]           Date of Birth: 1946/04/07           MRN: [id number] Visit Date: 03/25/2023 Requested by: Mort Sawyers, FNP [address] [place],  [place] [address] PCP: Mort Sawyers, FNP  Subjective: Chief Complaint  Patient presents with   Other     Scan review    HPI: Alicia [name] is a 77 y.o. female who presents to the office for MRI review. Patient denies any changes in symptoms.  Continues to complain mainly of right groin pain with pain that radiates down to the right knee.  Also has left and right buttock pain with no side significantly worse than the other.  Ambulating with a cane.  Notes a popping sensation in the right knee when she walks at times.  Does have history of left L4-L5 laminectomy and foraminotomies by Dr. Wynetta Emery back in September 2022.  MRI results revealed: MR Lumbar Spine w/o contrast  Result Date: 03/25/2023 CLINICAL DATA:  L2-L3 compression fracture suspected EXAM: MRI LUMBAR SPINE WITHOUT CONTRAST TECHNIQUE: Multiplanar, multisequence MR imaging of the lumbar spine was performed. No intravenous contrast was administered. COMPARISON:  MRI lumbar spine 02/23/2021, correlation is also made with lumbar spine radiographs 03/02/2023 FINDINGS: Segmentation: Partial sacralization of L5. The most inferior disc space is labeled L5-S1, consistent with the prior exams. Alignment: Scoliosis, with overall levoscoliosis of the lumbar spine. Straightening of the normal lumbar lordosis. Trace retrolisthesis of L1 on L2. Trace anterolisthesis of L5 on S1. previously noted trace anterolisthesis of L3 on L4 is no longer appreciated. Vertebrae: No acute or subacute fracture, evidence of discitis, or suspicious osseous lesion. Vertebral body heights appear unchanged compared to 02/23/2021 endplate degenerative changes at L2-L3 and L3-L4, eccentric to the right and L4-L5, eccentric to the left, likely related to the scoliosis.  Conus medullaris and cauda equina: Conus extends to the L1 level. Conus and cauda equina appear normal. 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 No lymphadenopathy. Atrophy of the inferior paraspinous muscles. Disc levels: T11-T12: Seen only on the sagittal images. Minimal disc bulge. No spinal canal stenosis or neural foraminal narrowing. T12-L1: Minimal disc bulge. Mild facet arthropathy. No spinal canal stenosis or neural foraminal narrowing. L1-L2: Trace retrolisthesis and mild disc bulge. Mild facet arthropathy. No spinal canal stenosis. Mild right neural foraminal narrowing, which is new from the prior exam. L2-L3: Disc height loss with likely lateral listhesis at this level, with right eccentric disc osteophyte complex. Left foraminal disc protrusion. Moderate right and mild left facet arthropathy. Narrowing of the right lateral recess. No spinal canal stenosis. Severe right neural foraminal narrowing, which has progressed from the prior exam. L3-L4: Mild disc bulge, eccentric to the right. Moderate right and mild left facet arthropathy. Narrowing of the lateral recesses. No spinal canal stenosis. Moderate to severe right neural foraminal narrowing, which has progressed from prior exam. L4-L5: Disc height loss, with left eccentric disc bulge. Moderate facet arthropathy. Narrowing of the left lateral recess. No spinal canal stenosis. Severe left and mild right neural foraminal narrowing, similar to the prior exam. L5-S1: Grade 1 anterolisthesis with disc unroofing. Severe facet arthropathy. No spinal canal stenosis. Severe left and moderate right neural foraminal narrowing, similar to prior. IMPRESSION: 1. No acute or subacute fracture. 2. L2-L3 severe right neural foraminal narrowing. 3. L3-L4 moderate to  severe right neural foraminal narrowing. 4. L4-L5 severe left and mild right neural foraminal narrowing. 5. L5-S1 severe  left and moderate right neural foraminal narrowing. 6. Narrowing of the lateral recesses on the right at L2-L3, bilaterally at L3-L4, and on the left at L4-L5, which could affect the descending L3, L4, and L5 nerve roots, respectively. Electronically Signed   By: Wiliam Ke M.D.   On: 03/25/2023 14:54   MR Pelvis w/o contrast  Result Date: 03/25/2023 CLINICAL DATA:  Hip pain, chronic. Patient fell 5 years ago with reported bilateral hip fractures. Patient fell again approximately 4 weeks ago. No previous relevant surgery. EXAM: MRI PELVIS WITHOUT CONTRAST TECHNIQUE: Multiplanar multisequence MR imaging of the pelvis was performed. No intravenous contrast was administered. COMPARISON:  Pelvic CT 03/05/2022. Pelvic and hip radiographs 03/02/2023. FINDINGS: Bones/Joint/Cartilage As correlated with previous CT, there are chronic fractures of the sacrum bilaterally and the right superior and inferior pubic rami. The pubic rami fractures demonstrate chronic nonunion, without associated marrow or surrounding soft tissue edema. There is prominent marrow edema throughout the left sacral ala which may reflect a superimposed acute insufficiency fracture. Moderately advanced sacroiliac degenerative changes are present bilaterally. Asymmetric endplate edema on the left at L4-5; lumbar spine findings are dictated separately. No evidence of acute hip fracture. There is mild asymmetric right hip arthropathy and a small left hip joint effusion. Ligaments No significant ligamentous abnormalities are seen. Muscles and Tendons Bilateral gluteus and common hamstring tendinosis. There is chronic atrophy of the gluteus and tensor fascia lata musculature bilaterally. Chronic atrophy of the left piriformis muscle. Soft tissue Minimal asymmetric fluid in the right greater trochanteric bursa. No other focal periarticular fluid collections. The visualized internal pelvic contents appear unremarkable. IMPRESSION: 1. Prominent marrow  edema throughout the left sacral ala may reflect a superimposed acute insufficiency fracture. 2. Chronic fractures of the sacrum bilaterally and the right superior and inferior pubic rami with chronic nonunion. No other acute osseous findings. 3. Moderately advanced sacroiliac degenerative changes bilaterally. 4. Lumbar spine findings are dictated separately. 5. Muscular atrophy and gluteus and common hamstring tendinosis as described. Electronically Signed   By: Carey Bullocks M.D.   On: 03/25/2023 14:46                 ROS: All systems reviewed are negative as they relate to the chief complaint within the history of present illness.  Patient denies fevers or chills.  Assessment & Plan: Visit Diagnoses:  1. Arthritis of right hip   2. Low back pain, unspecified back pain laterality, unspecified chronicity, unspecified whether sciatica present     Plan: Alicia [name] is a 77 y.o. female who presents to the office for review of MRI scans of the pelvis and lumbar spine.  MRI of the lumbar spine demonstrates severe foraminal narrowing at multiple levels bilaterally that could be contributing to some of her hip and knee pain.  MRI of the pelvis demonstrates what looks to be insufficiency fracture of the sacrum which may be responsible for her left-sided buttock pain but she does not really have any increased left buttock pain compared with the right buttock pain she has so could also have some contribution from either the low back or the advance SI joint arthritis that she has.  She is taking vitamin D daily so doubt her vitamin D is low.  We can try calcitonin to see if this will help with her pain from the insufficiency fracture.  Also seems that  her right hip joint is the most symptomatic problem for her today based on her exam.  Will try right hip injection under ultrasound which she tolerated well.  Also plan to refer patient to Dr. Alvester Morin for lumbar spine ESI to see if this will help with any  residual right leg pain she has and to see if this will help with some of her buttock/back pain.  Follow-up after ESI.  Follow-Up Instructions: No follow-ups on file.   Orders:  Orders Placed This Encounter  Procedures   US Guided Needle Placement - No Linked Charges   Ambulatory referral to Physical Medicine Rehab   Meds ordered this encounter  Medications   calcitonin, salmon, (MIACALCIN) 200 UNIT/ACT nasal spray    Sig: Place 1 spray into alternate nostrils daily. Use for one week    Dispense:  3.7 mL    Refill:  0      Procedures: Large Joint Inj: R hip joint on 03/25/2023 11:23 AM Indications: pain and diagnostic evaluation Details: 18 G 3.5 in needle, ultrasound-guided anterior approach  Arthrogram: No  Medications: 5 mL lidocaine 1 %; 4 mL bupivacaine 0.25 %; 40 mg methylPREDNISolone acetate 40 MG/ML Outcome: tolerated well, no immediate complications Procedure, treatment alternatives, risks and benefits explained, specific risks discussed. Consent was given by the patient. Immediately prior to procedure a time out was called to verify the correct patient, procedure, equipment, support staff and site/side marked as required. Patient was prepped and draped in the usual sterile fashion.       Clinical Data: No additional findings.  Objective: Vital Signs: There were no vitals taken for this visit.  Physical Exam:  Constitutional: Patient appears well-developed HEENT:  Head: Normocephalic Eyes:EOM are normal Neck: Normal range of motion Cardiovascular: Normal rate Pulmonary/chest: Effort normal Neurologic: Patient is alert Skin: Skin is warm Psychiatric: Patient has normal mood and affect  Ortho Exam: Ortho exam demonstrates increased pain localized to the right groin with FADIR sign and positive Stinchfield sign.  No calf tenderness.  Negative Homans' sign.  She has intact ankle dorsiflexion, plantarflexion, inversion, quadricep strength, hamstring strength,  hip flexion, EHL without any significant weakness of bilateral lower extremities.  Motor strength rated 5/5.  She does have tenderness mildly throughout the axial lumbar spine and moderately throughout the left and right buttock near the SI joints bilaterally.  Specialty Comments:  No specialty comments available.  Imaging: No results found.   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   Centrilobular emphysema (HCC) 02/13/2022   Cigarette smoker 02/06/2022   Asthmatic bronchitis , chronic 02/05/2022   Incidental pulmonary nodule, greater than or equal to 8mm 01/21/2022   Bilateral carotid artery stenosis 12/26/2021   Iron deficiency anemia 12/26/2021   Mixed hyperlipidemia 12/26/2021   Chronic cough 12/26/2021   Adjustment reaction with anxiety and depression 12/26/2021   Chronic venous insufficiency 02/29/2020   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/05/2019   Arthritis of shoulder 06/08/2018   GERD (gastroesophageal reflux disease) 06/08/2018   Macular degeneration 06/08/2018   Sleep apnea 06/08/2018   Past  Medical History:  Diagnosis Date   Anemia    iron deficiency   Arthritis    Asthma    Carotid artery occlusion    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HCC)    has celiac artery stent   COPD with acute exacerbation (HCC) 12/26/2021   Depression    GERD (gastroesophageal reflux disease)    History of hiatal hernia    Macular degeneration    Positive fecal occult blood test 01/19/2022   Sleep apnea    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   R mid  forearm, [facility]    Family History  Problem Relation Age of Onset   Heart disease Mother    Cerebral aneurysm Father     Past Surgical History:  Procedure Laterality Date   APPENDECTOMY     CELIAC ARTERY STENT     CHOLECYSTECTOMY  10/2016   ENTEROSCOPY N/A 12/17/2022   Procedure: ENTEROSCOPY;  Surgeon: Imogene Burn, MD;  Location: [facility];  Service: Gastroenterology;  Laterality: N/A;  small bowel enteroscopy   EYE SURGERY Bilateral 2017   cataracts removed   HOT HEMOSTASIS  12/17/2022   Procedure: HOT HEMOSTASIS (ARGON PLASMA COAGULATION/BICAP);  Surgeon: Imogene Burn, MD;  Location: [facility];  Service: Gastroenterology;;   LUMBAR LAMINECTOMY/DECOMPRESSION MICRODISCECTOMY Left 07/21/2021   Procedure: Laminectomy and Foraminotomy - L4-L5 - left;  Surgeon: Donalee Citrin, MD;  Location: [facility] OR;  Service: Neurosurgery;  Laterality: Left;  3C   PAROTID GLAND TUMOR EXCISION Left 2003   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   Procedure: PERIPHERAL VASCULAR INTERVENTION;  Surgeon: Maeola Harman, MD;  Location: [facility] INVASIVE CV LAB;  Service: Cardiovascular;;  celiac   TOTAL SHOULDER ARTHROPLASTY Right 08/06/2022   Procedure: RIGHT REVERSE SHOULDER ARTHROPLASTY;  Surgeon: Cammy Copa, MD;  Location: [facility] OR;  Service: Orthopedics;  Laterality: Right;   VISCERAL ANGIOGRAPHY N/A 05/18/2022   Procedure: VISCERAL ANGIOGRAPHY;  Surgeon: Maeola Harman, MD;  Location: [facility] INVASIVE CV LAB;  Service: Cardiovascular;  Laterality: N/A;   Social History   Occupational History   Not on file  Tobacco Use   Smoking status: Every Day    Packs/day: 1.50    Years: 62.00    Additional pack years: 0.00    Total pack years: 93.00    Types: Cigarettes    Passive exposure: Current   Smokeless tobacco: Never   Tobacco comments:    Smokes 1- 1.5 PPD 01/29/2023  Vaping Use   Vaping Use: Never used  Substance and Sexual Activity   Alcohol use: Not Currently   Drug use:  Never   Sexual activity: Not Currently

## 2023-03-31 ENCOUNTER — Other Ambulatory Visit: Payer: Self-pay | Admitting: Family

## 2023-03-31 NOTE — Telephone Encounter (Signed)
How often is pt using her albuterol emergency inhaler? Seems we have been filling monthly over the last few months. If this sob and or with Doe we need to evaluate this further.

## 2023-04-01 NOTE — Telephone Encounter (Signed)
Spoke with the patient and she states she takes once a day, but will call to schedule an appt if she begins to have SOB. She did not want to schedule right now.

## 2023-04-07 ENCOUNTER — Other Ambulatory Visit: Payer: Self-pay | Admitting: Family

## 2023-04-07 ENCOUNTER — Other Ambulatory Visit: Payer: Self-pay | Admitting: Vascular Surgery

## 2023-04-07 DIAGNOSIS — F4323 Adjustment disorder with mixed anxiety and depressed mood: Secondary | ICD-10-CM

## 2023-04-07 NOTE — Telephone Encounter (Signed)
Appt has been r/s

## 2023-04-09 ENCOUNTER — Other Ambulatory Visit: Payer: Self-pay | Admitting: Surgical

## 2023-04-12 ENCOUNTER — Ambulatory Visit (INDEPENDENT_AMBULATORY_CARE_PROVIDER_SITE_OTHER): Payer: PPO | Admitting: Physical Medicine and Rehabilitation

## 2023-04-12 ENCOUNTER — Encounter: Payer: Self-pay | Admitting: Physical Medicine and Rehabilitation

## 2023-04-12 DIAGNOSIS — G894 Chronic pain syndrome: Secondary | ICD-10-CM | POA: Diagnosis not present

## 2023-04-12 DIAGNOSIS — M5441 Lumbago with sciatica, right side: Secondary | ICD-10-CM | POA: Diagnosis not present

## 2023-04-12 DIAGNOSIS — G8929 Other chronic pain: Secondary | ICD-10-CM

## 2023-04-12 DIAGNOSIS — M5416 Radiculopathy, lumbar region: Secondary | ICD-10-CM | POA: Diagnosis not present

## 2023-04-12 DIAGNOSIS — M7918 Myalgia, other site: Secondary | ICD-10-CM

## 2023-04-12 DIAGNOSIS — M5442 Lumbago with sciatica, left side: Secondary | ICD-10-CM

## 2023-04-12 DIAGNOSIS — R269 Unspecified abnormalities of gait and mobility: Secondary | ICD-10-CM

## 2023-04-12 NOTE — Progress Notes (Signed)
   04/12/23 1052  Fall Screening  Falls in the past year? 1  Number of falls in past year 0  Was there an injury with Fall? 1  Fall Risk Category Calculator 2  Fall Risk  Patient at Risk for Falls Due to Impaired balance/gait;Impaired mobility;Orthopedic patient  Fall risk Follow up Falls evaluation completed;Education provided;Falls prevention discussed

## 2023-04-12 NOTE — Progress Notes (Unsigned)
Alicia [name] - 77 y.o. female MRN [id number]  Date of birth: [birth year]  Office Visit Note: Visit Date: 04/12/2023 PCP: Mort Sawyers, FNP Referred by: Mort Sawyers, FNP  Subjective: Chief Complaint  Patient presents with   Lower Back - Pain   HPI: Alicia [name] is a 77 y.o. female who comes in today for evaluation of chronic, worsening and severe bilateral lower back pain radiating to buttocks, hips and down anterior thighs to knees. Patient last seen in 2022. Pain ongoing for several years, became severe about 6 months ago. Pain worsens with movement and activity. She describes pain as stabbing and shooting sensation, reports severe pain with prolonged sitting. Some relief of pain with rest and use of medications. History of multiple regimens of formal physical therapy over the years, states these treatments caused increased pain. Recent lumbar MRI imaging exhibits multi level foraminal stenosis, severe on the left at L4-L5 and L5-S1. There is also multi level lateral recess stenosis and severe facet arthropathy at L5-S1, moderate at L4-L5. No high grade spinal canal stenosis noted. History of left L4-L5 laminectomy foraminotomies with Dr. Donalee Citrin in September of 2022. Patient underwent left L4-L5 interlaminar epidural steroid injection in our office in February of 2022, she reports significant relief of pain with this procedure, greater than 50% for over 3 months. Did undergo bilateral L5-S1 facet joint injections in 2021, minimal relief of pain for only a few days. At one point, Dr. Alvester Morin was considering radiofrequency ablation, however pain pattern seems to be more radicular in nature. She also reports increased functional ability post injection. History of peripheral neuropathy and restless leg syndrome. She does take Gabapentin 300 mg TID. Currently ambulates with cane. Patient denies focal weakness.  History of multiple orthopedic issues and frequent falls, managed from  orthopedic standpoint by Dr Lorin Picket Dean/Luke. Prior right reverse shoulder arthroplasty in 2023, scheduled for left this coming July. She has history of chronic sacral insufficiency fractures, she reports recent fall in April of this year, new pelvic MRI imaging suggests prominent marrow edema which could be indicative of more acute sacral fractures. Recently evaluated by Franky Macho whom performed right sided ultrasound guided hip injection, some improvement of pain with this procedure. Patient currently using cane to ambulate.   Review of Systems  Musculoskeletal:  Positive for back pain.  Neurological:  Positive for tingling. Negative for focal weakness and weakness.  All other systems reviewed and are negative.  Otherwise per HPI.  Assessment & Plan: Visit Diagnoses:    ICD-10-CM   1. Lumbar radiculopathy  M54.16 Ambulatory referral to Physical Medicine Rehab    2. Chronic bilateral low back pain with bilateral sciatica  M54.42 Ambulatory referral to Physical Medicine Rehab   M54.41    G89.29     3. Chronic buttock pain  M79.18 Ambulatory referral to Physical Medicine Rehab   G89.29     4. Gait abnormality  R26.9 Ambulatory referral to Physical Medicine Rehab    5. Chronic pain syndrome  G89.4 Ambulatory referral to Physical Medicine Rehab       Plan: Findings:  Chronic, worsening and severe bilateral lower back pain radiating to buttocks, hips and down anterior thighs to knees. I discussed recent lumbar MRI imaging with her today. Patient continues to have severe pain despite good conservative therapies such as formal physical therapy, home exercise regimen, rest and use of medications. Patients clinical presentation are complex, she does have chronic pain associated with sacral insufficiency fractures and right hip  issues. Her pain distribution seems to be most consistent with L3 pattern. Next step is to perform diagnostic and hopefully therapeutic midline L3-L4 interlaminar epidural steroid  injection under fluoroscopic guidance. She is not currently taking anticoagulants. If good relief of pain with injection we can repeat this procedure infrequency as needed. I discussed injection procedure with patient in detail today, she has no questions at this time. Could also look at re-grouping with our formal physical therapy team. Patient is to continue with current medication regimen. I would like to have her follow up post injection to re-evaluate. No red flag symptoms noted upon exam today.     Meds & Orders: No orders of the defined types were placed in this encounter.   Orders Placed This Encounter  Procedures   Ambulatory referral to Physical Medicine Rehab    Follow-up: Return for Midline L3-L4 interlaminar epidural steroid injection.   Procedures: No procedures performed      Clinical History: EXAM: MRI LUMBAR SPINE WITHOUT CONTRAST   TECHNIQUE: Multiplanar, multisequence MR imaging of the lumbar spine was performed. No intravenous contrast was administered.   COMPARISON:  MRI lumbar spine 02/23/2021, correlation is also made with lumbar spine radiographs 03/02/2023   FINDINGS: Segmentation: Partial sacralization of L5. The most inferior disc space is labeled L5-S1, consistent with the prior exams.   Alignment: Scoliosis, with overall levoscoliosis of the lumbar spine. Straightening of the normal lumbar lordosis. Trace retrolisthesis of L1 on L2. Trace anterolisthesis of L5 on S1. previously noted trace anterolisthesis of L3 on L4 is no longer appreciated.   Vertebrae: No acute or subacute fracture, evidence of discitis, or suspicious osseous lesion. Vertebral body heights appear unchanged compared to 02/23/2021 endplate degenerative changes at L2-L3 and L3-L4, eccentric to the right and L4-L5, eccentric to the left, likely related to the scoliosis.   Conus medullaris and cauda equina: Conus extends to the L1 level. Conus and cauda equina appear normal.    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 No lymphadenopathy. Atrophy of the inferior paraspinous muscles.   Disc levels:   T11-T12: Seen only on the sagittal images. Minimal disc bulge. No spinal canal stenosis or neural foraminal narrowing.   T12-L1: Minimal disc bulge. Mild facet arthropathy. No spinal canal stenosis or neural foraminal narrowing.   L1-L2: Trace retrolisthesis and mild disc bulge. Mild facet arthropathy. No spinal canal stenosis. Mild right neural foraminal narrowing, which is new from the prior exam.   L2-L3: Disc height loss with likely lateral listhesis at this level, with right eccentric disc osteophyte complex. Left foraminal disc protrusion. Moderate right and mild left facet arthropathy. Narrowing of the right lateral recess. No spinal canal stenosis. Severe right neural foraminal narrowing, which has progressed from the prior exam.   L3-L4: Mild disc bulge, eccentric to the right. Moderate right and mild left facet arthropathy. Narrowing of the lateral recesses. No spinal canal stenosis. Moderate to severe right neural foraminal narrowing, which has progressed from prior exam.   L4-L5: Disc height loss, with left eccentric disc bulge. Moderate facet arthropathy. Narrowing of the left lateral recess. No spinal canal stenosis. Severe left and mild right neural foraminal narrowing, similar to the prior exam.   L5-S1: Grade 1 anterolisthesis with disc unroofing. Severe facet arthropathy. No spinal canal stenosis. Severe left and moderate right neural foraminal narrowing, similar to prior.   IMPRESSION: 1. No acute or subacute fracture. 2. L2-L3 severe right  neural foraminal narrowing. 3. L3-L4 moderate to severe right neural foraminal narrowing. 4. L4-L5 severe left and mild right neural foraminal narrowing. 5. L5-S1 severe left and moderate right neural foraminal  narrowing. 6. Narrowing of the lateral recesses on the right at L2-L3, bilaterally at L3-L4, and on the left at L4-L5, which could affect the descending L3, L4, and L5 nerve roots, respectively.     Electronically Signed   By: Wiliam Ke M.D.   On: 03/25/2023 14:54   She reports that she has been smoking cigarettes. She has a 93.00 pack-year smoking history. She has been exposed to tobacco smoke. She has never used smokeless tobacco. No results for input(s): "HGBA1C", "LABURIC" in the last 8760 hours.  Objective:  VS:  HT:    WT:   BMI:     BP:   HR: bpm  TEMP: ( )  RESP:  Physical Exam Vitals and nursing note reviewed.  HENT:     Head: Normocephalic and atraumatic.     Right Ear: External ear normal.     Left Ear: External ear normal.     Nose: Nose normal.     Mouth/Throat:     Mouth: Mucous membranes are moist.  Eyes:     Extraocular Movements: Extraocular movements intact.  Cardiovascular:     Rate and Rhythm: Normal rate.     Pulses: Normal pulses.  Pulmonary:     Effort: Pulmonary effort is normal.  Abdominal:     General: Abdomen is flat. There is no distension.  Musculoskeletal:        General: Tenderness present.     Cervical back: Normal range of motion.     Comments: Patient rises from seated position to standing without difficulty. Good lumbar range of motion. No pain noted with facet loading. 5/5 strength noted with bilateral hip flexion, knee flexion/extension, ankle dorsiflexion/plantarflexion and EHL. No clonus noted bilaterally.  No pain upon palpation of greater trochanters. Pain with internal/external rotation of right hip. Sensation intact bilaterally. Dysesthesias noted to bilateral L3 dermatomes. Tenderness noted to bilateral lumbar paraspinal regions, tenderness also noted upon palpation of buttocks. Negative slump test bilaterally. Ambulates with cane, gait slow and unsteady.     Skin:    General: Skin is warm and dry.     Capillary Refill:  Capillary refill takes less than 2 seconds.  Neurological:     Mental Status: She is alert and oriented to person, place, and time.     Gait: Gait abnormal.  Psychiatric:        Mood and Affect: Mood normal.        Behavior: Behavior normal.     Ortho Exam  Imaging: No results found.  Past Medical/Family/Surgical/Social History: Medications & Allergies reviewed per EMR, new medications updated. 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   Centrilobular emphysema (HCC) 02/13/2022   Cigarette smoker 02/06/2022   Asthmatic bronchitis , chronic 02/05/2022   Incidental pulmonary nodule, greater than or equal to 8mm 01/21/2022   Bilateral carotid artery stenosis 12/26/2021   Iron deficiency anemia 12/26/2021   Mixed hyperlipidemia  12/26/2021   Chronic cough 12/26/2021   Adjustment reaction with anxiety and depression 12/26/2021   Chronic venous insufficiency 02/29/2020   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/05/2019   Arthritis of shoulder 06/08/2018   GERD (gastroesophageal reflux disease) 06/08/2018   Macular degeneration 06/08/2018   Sleep apnea 06/08/2018   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   COPD with acute exacerbation (HCC) 12/26/2021   Depression    GERD (gastroesophageal reflux disease)    History of hiatal hernia    Macular degeneration    Positive fecal occult blood test 01/19/2022   Sleep apnea    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   R mid forearm, [facility]   Family History  Problem Relation Age of Onset   Heart disease Mother    Cerebral aneurysm Father    Past Surgical History:  Procedure Laterality Date   APPENDECTOMY     CELIAC ARTERY STENT     CHOLECYSTECTOMY  10/2016   ENTEROSCOPY N/A 12/17/2022   Procedure: ENTEROSCOPY;  Surgeon: Imogene Burn, MD;  Location: [facility];  Service: Gastroenterology;  Laterality: N/A;  small bowel enteroscopy   EYE SURGERY Bilateral 2017   cataracts removed   HOT HEMOSTASIS  12/17/2022   Procedure: HOT HEMOSTASIS (ARGON PLASMA COAGULATION/BICAP);  Surgeon: Imogene Burn, MD;  Location: [facility];  Service: Gastroenterology;;   LUMBAR LAMINECTOMY/DECOMPRESSION MICRODISCECTOMY Left 07/21/2021   Procedure: Laminectomy and Foraminotomy - L4-L5 - left;  Surgeon: Donalee Citrin, MD;  Location: [facility] OR;  Service: Neurosurgery;  Laterality: Left;  3C   PAROTID GLAND TUMOR EXCISION Left 2003   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   Procedure: PERIPHERAL VASCULAR INTERVENTION;  Surgeon: Maeola Harman, MD;  Location: [facility] INVASIVE CV LAB;  Service: Cardiovascular;;  celiac   TOTAL SHOULDER ARTHROPLASTY Right 08/06/2022   Procedure: RIGHT REVERSE SHOULDER ARTHROPLASTY;  Surgeon: Cammy Copa, MD;  Location: [facility] OR;  Service: Orthopedics;  Laterality: Right;   VISCERAL ANGIOGRAPHY N/A 05/18/2022   Procedure: VISCERAL ANGIOGRAPHY;  Surgeon: Maeola Harman, MD;  Location: [facility] INVASIVE CV LAB;  Service: Cardiovascular;  Laterality: N/A;   Social History   Occupational History   Not on file  Tobacco Use   Smoking status: Every Day    Packs/day: 1.50    Years: 62.00    Additional pack years: 0.00    Total pack years: 93.00    Types: Cigarettes    Passive exposure: Current   Smokeless tobacco: Never   Tobacco comments:    Smokes 1- 1.5 PPD 01/29/2023  Vaping Use   Vaping Use: Never used  Substance and Sexual Activity    Alcohol use: Not Currently   Drug use: Never   Sexual activity: Not Currently

## 2023-04-19 ENCOUNTER — Telehealth: Payer: Self-pay | Admitting: Physical Medicine and Rehabilitation

## 2023-04-19 NOTE — Telephone Encounter (Signed)
Spoke with patient and scheduled injection for 04/27/23. Patient aware driver needed Follow up scheduled 05/25/23

## 2023-04-19 NOTE — Telephone Encounter (Signed)
Patient called asked for a call back concerning scheduling an appointment with Dr. Alvester Morin. The number to contact patient is [phone]

## 2023-04-20 ENCOUNTER — Other Ambulatory Visit: Payer: Self-pay

## 2023-04-20 DIAGNOSIS — M19012 Primary osteoarthritis, left shoulder: Secondary | ICD-10-CM

## 2023-04-22 NOTE — Progress Notes (Signed)
Pt scheduled for 06/17

## 2023-04-26 ENCOUNTER — Ambulatory Visit
Admission: RE | Admit: 2023-04-26 | Discharge: 2023-04-26 | Disposition: A | Discharge status: Home / Self Care | Payer: PPO | Source: Ambulatory Visit | Attending: Surgical | Admitting: Surgical

## 2023-04-26 DIAGNOSIS — M19012 Primary osteoarthritis, left shoulder: Secondary | ICD-10-CM

## 2023-04-26 DIAGNOSIS — M25512 Pain in left shoulder: Secondary | ICD-10-CM | POA: Diagnosis not present

## 2023-04-27 ENCOUNTER — Ambulatory Visit (INDEPENDENT_AMBULATORY_CARE_PROVIDER_SITE_OTHER): Payer: PPO | Admitting: Physical Medicine and Rehabilitation

## 2023-04-27 ENCOUNTER — Other Ambulatory Visit: Payer: Self-pay

## 2023-04-27 VITALS — BP 127/73 | HR 83

## 2023-04-27 DIAGNOSIS — M5416 Radiculopathy, lumbar region: Secondary | ICD-10-CM

## 2023-04-27 MED ORDER — METHYLPREDNISOLONE ACETATE 80 MG/ML IJ SUSP
80.0000 mg | Freq: Once | INTRAMUSCULAR | Status: AC
Start: 2023-04-27 — End: 2023-04-27
  Administered 2023-04-27: 80 mg

## 2023-04-27 NOTE — Progress Notes (Signed)
Functional Pain Scale - descriptive words and definitions  Distracting (5)    Aware of pain/able to complete some ADL's but limited by pain/sleep is affected and active distractions are only slightly useful. Moderate range order  Average Pain 7   +Driver, -BT, -Dye Allergies.  Lower back pain in the middle that radiates into both legs. Pain increases with sitting and standing too long. Tylenol helps pain

## 2023-04-27 NOTE — Patient Instructions (Signed)

## 2023-04-28 ENCOUNTER — Inpatient Hospital Stay: Payer: PPO | Attending: Oncology

## 2023-04-28 DIAGNOSIS — F17210 Nicotine dependence, cigarettes, uncomplicated: Secondary | ICD-10-CM | POA: Diagnosis not present

## 2023-04-28 DIAGNOSIS — D509 Iron deficiency anemia, unspecified: Secondary | ICD-10-CM | POA: Diagnosis not present

## 2023-04-28 LAB — CBC WITH DIFFERENTIAL/PLATELET
Abs Immature Granulocytes: 0.02 10*3/uL (ref 0.00–0.07)
Basophils Absolute: 0 10*3/uL (ref 0.0–0.1)
Basophils Relative: 1 %
Eosinophils Absolute: 0.2 10*3/uL (ref 0.0–0.5)
Eosinophils Relative: 3 %
HCT: 40.6 % (ref 36.0–46.0)
Hemoglobin: 13 g/dL (ref 12.0–15.0)
Immature Granulocytes: 0 %
Lymphocytes Relative: 22 %
Lymphs Abs: 1.8 10*3/uL (ref 0.7–4.0)
MCH: 28.3 pg (ref 26.0–34.0)
MCHC: 32 g/dL (ref 30.0–36.0)
MCV: 88.5 fL (ref 80.0–100.0)
Monocytes Absolute: 0.6 10*3/uL (ref 0.1–1.0)
Monocytes Relative: 7 %
Neutro Abs: 5.4 10*3/uL (ref 1.7–7.7)
Neutrophils Relative %: 67 %
Platelets: 209 10*3/uL (ref 150–400)
RBC: 4.59 MIL/uL (ref 3.87–5.11)
RDW: 15.6 % — ABNORMAL HIGH (ref 11.5–15.5)
WBC: 8.1 10*3/uL (ref 4.0–10.5)
nRBC: 0 % (ref 0.0–0.2)

## 2023-04-28 LAB — IRON AND TIBC
Iron: 40 ug/dL (ref 28–170)
Saturation Ratios: 9 % — ABNORMAL LOW (ref 10.4–31.8)
TIBC: 468 ug/dL — ABNORMAL HIGH (ref 250–450)
UIBC: 428 ug/dL

## 2023-04-28 LAB — FERRITIN: Ferritin: 9 ng/mL — ABNORMAL LOW (ref 11–307)

## 2023-04-29 ENCOUNTER — Inpatient Hospital Stay: Payer: PPO

## 2023-04-29 ENCOUNTER — Inpatient Hospital Stay (HOSPITAL_BASED_OUTPATIENT_CLINIC_OR_DEPARTMENT_OTHER): Payer: PPO | Admitting: Oncology

## 2023-04-29 ENCOUNTER — Encounter: Payer: Self-pay | Admitting: Oncology

## 2023-04-29 VITALS — BP 107/77 | HR 76 | Temp 98.2°F | Resp 18 | Ht 60.0 in | Wt 196.0 lb

## 2023-04-29 DIAGNOSIS — D509 Iron deficiency anemia, unspecified: Secondary | ICD-10-CM

## 2023-04-29 MED ORDER — SODIUM CHLORIDE 0.9 % IV SOLN
INTRAVENOUS | Status: DC
  Filled 2023-04-29: qty 250

## 2023-04-29 MED ORDER — SODIUM CHLORIDE 0.9 % IV SOLN
200.0000 mg | Freq: Once | INTRAVENOUS | Status: AC
  Administered 2023-04-29: 200 mg via INTRAVENOUS
  Filled 2023-04-29: qty 200

## 2023-04-29 NOTE — Patient Instructions (Signed)

## 2023-04-29 NOTE — Progress Notes (Signed)
[facility]  [phone] [phone]  ID: IRIA Alicia [name] OB: [birth year]  MR#: [id number]  QIO#:[id number]  Patient Care Team: Mort Sawyers, FNP as PCP - General (Family Medicine)  CHIEF COMPLAINT: Iron deficiency anemia.  INTERVAL HISTORY: Patient returns to clinic today for repeat laboratory work, further evaluation, consideration of additional IV Venofer.  She continues to have fatigue, but otherwise feels well.  She has no neurologic complaints.  She denies any recent fevers or illnesses.  She has a good appetite and denies weight loss.  She has no chest pain, shortness of breath, cough, or hemoptysis.  She denies any nausea, vomiting, constipation, or diarrhea.  She has no melena or hematochezia.   She has no urinary complaints.  Patient offers no further specific complaints today.  REVIEW OF SYSTEMS:   Review of Systems  Constitutional:  Positive for malaise/fatigue. Negative for fever and weight loss.  Respiratory: Negative.  Negative for cough, hemoptysis and shortness of breath.   Cardiovascular: Negative.  Negative for chest pain and leg swelling.  Gastrointestinal: Negative.  Negative for abdominal pain, blood in stool, constipation, diarrhea, melena and nausea.  Genitourinary: Negative.  Negative for hematuria.  Musculoskeletal:  Positive for joint pain. Negative for back pain.  Skin: Negative.  Negative for rash.  Neurological: Negative.  Negative for dizziness, focal weakness, weakness and headaches.    As per HPI. Otherwise, a complete review of systems is negative.  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   R mid forearm, [facility]    PAST SURGICAL HISTORY: Past Surgical History:  Procedure Laterality Date   APPENDECTOMY     CELIAC ARTERY STENT     CHOLECYSTECTOMY  10/2016   ENTEROSCOPY N/A 12/17/2022   Procedure: ENTEROSCOPY;  Surgeon: Imogene Burn, MD;  Location: [facility];  Service: Gastroenterology;  Laterality: N/A;  small bowel enteroscopy   EYE SURGERY Bilateral 2017   cataracts removed   HOT HEMOSTASIS  12/17/2022   Procedure: HOT HEMOSTASIS (ARGON PLASMA COAGULATION/BICAP);  Surgeon: Imogene Burn, MD;  Location: [facility];  Service: Gastroenterology;;   LUMBAR LAMINECTOMY/DECOMPRESSION MICRODISCECTOMY Left 07/21/2021   Procedure: Laminectomy and Foraminotomy - L4-L5 - left;  Surgeon: Donalee Citrin, MD;  Location: [facility] OR;  Service: Neurosurgery;  Laterality: Left;  3C   PAROTID GLAND TUMOR EXCISION Left 2003   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   Procedure: PERIPHERAL VASCULAR INTERVENTION;  Surgeon: Maeola Harman, MD;  Location: [facility] INVASIVE CV LAB;  Service: Cardiovascular;;  celiac   TOTAL SHOULDER ARTHROPLASTY Right 08/06/2022   Procedure: RIGHT REVERSE SHOULDER ARTHROPLASTY;  Surgeon: Cammy Copa, MD;  Location: [facility];  Service: Orthopedics;  Laterality: Right;   VISCERAL ANGIOGRAPHY N/A 05/18/2022   Procedure: VISCERAL ANGIOGRAPHY;  Surgeon: Maeola Harman, MD;  Location: [facility] INVASIVE CV LAB;  Service: Cardiovascular;  Laterality: N/A;    FAMILY HISTORY: Family History  Problem Relation Age of Onset   Heart disease Mother  Cerebral aneurysm Father     ADVANCED DIRECTIVES (Y/N):  N  HEALTH MAINTENANCE: Social History   Tobacco Use   Smoking status: Every Day    Packs/day: 1.50    Years: 62.00    Additional pack years: 0.00    Total pack years: 93.00    Types: Cigarettes    Passive exposure: Current   Smokeless  tobacco: Never   Tobacco comments:    Smokes 1- 1.5 PPD 01/29/2023  Vaping Use   Vaping Use: Never used  Substance Use Topics   Alcohol use: Not Currently   Drug use: Never     Colonoscopy:  PAP:  Bone density:  Lipid panel:  Allergies  Allergen Reactions   Amoxicillin     Yeast infections    Current Outpatient Medications  Medication Sig Dispense Refill   acetaminophen (TYLENOL) 500 MG tablet Take 1,000 mg by mouth every 6 (six) hours as needed for moderate pain.     albuterol (VENTOLIN HFA) 108 (90 Base) MCG/ACT inhaler INHALE 1-2 PUFFS INTO THE LUNGS 4 (FOUR) TIMES DAILY AS NEEDED FOR SHORTNESS OF BREATH OR WHEEZING. 8.5 each 0   aspirin EC 81 MG tablet Take 81 mg by mouth every other day. Swallow whole.     BREZTRI AEROSPHERE 160-9-4.8 MCG/ACT AERO INHALE 2 PUFFS INTO THE LUNGS TWICE DAILY. 10.7 each 11   Cholecalciferol (VITAMIN D3) 125 MCG (5000 UT) CAPS Take 5,000 Units by mouth in the morning.     faricimab-svoa (VABYSMO) 6 MG/0.05ML SOLN intravitreal injection 6 mg by Intravitreal route every 8 (eight) weeks.     gabapentin (NEURONTIN) 300 MG capsule TAKE 1 CAPSULE BY MOUTH THREE TIMES A DAY 270 capsule 1   hydrocortisone 2.5 % cream Apply to rash on face and under breasts once to twice daily as directed. 30 g 2   Lutein 20 MG TABS Take 20 mg by mouth in the morning.     Magnesium 400 MG TABS Take 400 mg by mouth daily.     omeprazole (PRILOSEC) 40 MG capsule TAKE 1 CAPSULE (40 MG TOTAL) BY MOUTH DAILY. 90 capsule 1   pramipexole (MIRAPEX) 0.5 MG tablet TAKE 1 TABLET BY MOUTH TWICE A DAY 180 tablet 1   rosuvastatin (CRESTOR) 10 MG tablet TAKE 1 TABLET BY MOUTH EVERY DAY 30 tablet 0   venlafaxine XR (EFFEXOR-XR) 75 MG 24 hr capsule TAKE 1 CAPSULE BY MOUTH DAILY WITH BREAKFAST. 90 capsule 0   Current Facility-Administered Medications  Medication Dose Route Frequency Provider Last Rate Last Admin   methylPREDNISolone acetate (DEPO-MEDROL) injection 80 mg  80 mg Other Once  Tyrell Antonio, MD       Facility-Administered Medications Ordered in Other Visits  Medication Dose Route Frequency Provider Last Rate Last Admin   0.9 %  sodium chloride infusion   Intravenous Continuous Jeralyn Ruths, MD 20 mL/hr at 04/29/23 1446 New Bag at 04/29/23 1446    OBJECTIVE: Vitals:   04/29/23 1402  BP: 107/77  Pulse: 76  Resp: 18  Temp: 98.2 F (36.8 C)  SpO2: 96%     Body mass index is 38.28 kg/m.    ECOG FS:1 - Symptomatic but completely ambulatory  General: Well-developed, well-nourished, no acute distress. Eyes: Pink conjunctiva, anicteric sclera. HEENT: Normocephalic, moist mucous membranes. Lungs: No audible wheezing or coughing. Heart: Regular rate and rhythm. Abdomen: Soft, nontender, no obvious distention. Musculoskeletal: No edema, cyanosis, or clubbing. Neuro: Alert, answering all questions appropriately. Cranial nerves grossly intact. Skin: No rashes  or petechiae noted. Psych: Normal affect.  LAB RESULTS:  Lab Results  Component Value Date   NA 139 12/28/2022   K 3.9 12/28/2022   CL 104 12/28/2022   CO2 26 12/28/2022   GLUCOSE 87 12/28/2022   BUN 10 12/28/2022   CREATININE 0.70 12/28/2022   CALCIUM 9.5 12/28/2022   PROT 7.0 12/28/2022   ALBUMIN 4.0 12/28/2022   AST 13 12/28/2022   ALT 9 12/28/2022   ALKPHOS 86 12/28/2022   BILITOT 0.3 12/28/2022   GFRNONAA >60 07/29/2022    Lab Results  Component Value Date   WBC 8.1 04/28/2023   NEUTROABS 5.4 04/28/2023   HGB 13.0 04/28/2023   HCT 40.6 04/28/2023   MCV 88.5 04/28/2023   PLT 209 04/28/2023   Lab Results  Component Value Date   IRON 40 04/28/2023   TIBC 468 (H) 04/28/2023   IRONPCTSAT 9 (L) 04/28/2023   Lab Results  Component Value Date   FERRITIN 9 (L) 04/28/2023     STUDIES: XR C-ARM NO REPORT  Result Date: 04/27/2023 Please see Notes tab for imaging impression.  CT SHOULDER LEFT WO CONTRAST  Result Date: 04/26/2023 CLINICAL DATA:  Left shoulder pain,  preop planning.  Osteoarthritis. EXAM: CT OF THE UPPER LEFT EXTREMITY WITHOUT CONTRAST TECHNIQUE: Multidetector CT imaging of the upper left extremity was performed according to the standard protocol. RADIATION DOSE REDUCTION: This exam was performed according to the departmental dose-optimization program which includes automated exposure control, adjustment of the mA and/or kV according to patient size and/or use of iterative reconstruction technique. COMPARISON:  Radiographs dated March 02, 2023 FINDINGS: Bones/Joint/Cartilage Near complete loss of the articular cartilage with bone-on-bone articulation and subchondral cystic changes. Bulky marginal osteophytes. No appreciable joint effusion. Moderate acromioclavicular osteoarthritis. Ligaments Suboptimally assessed by CT. Muscles and Tendons Rotator cuff tendons appear intact. No evidence of tendon tear. No muscle atrophy. Soft tissues Skin and subcutaneous soft tissues are within normal limits. IMPRESSION: 1. Severe osteoarthritis of the left glenohumeral joint with bone-on-bone articulation and subchondral cystic changes. 2. Moderate acromioclavicular osteoarthritis. 3. No evidence of rotator cuff tendon tear. Electronically Signed   By: Larose Hires D.O.   On: 04/26/2023 10:27    ASSESSMENT: Iron deficiency anemia.  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  Her most recent endoscopic was in February 2024.  Patient's hemoglobin is now within normal limits at 13.0, but her iron stores remain decreased and she is mildly symptomatic.  Proceed with IV Venofer today.  Return to clinic twice next week for additional doses.  Patient then return to clinic in 3 months with repeat laboratory work, further evaluation, and consideration of additional Venofer.   Constipation/nausea: Patient does not complain of this today.  Continue evaluation and treatment per GI.  I spent a total of 30 minutes  reviewing chart data, face-to-face evaluation with the patient, counseling and coordination of care as detailed above.   Patient expressed understanding and was in agreement with this plan. She also understands that She can call clinic at any time with any questions, concerns, or complaints.    Jeralyn Ruths, MD   04/29/2023 3:09 PM

## 2023-05-03 ENCOUNTER — Inpatient Hospital Stay: Payer: PPO

## 2023-05-03 VITALS — BP 124/61 | HR 72 | Temp 98.5°F | Resp 18

## 2023-05-03 DIAGNOSIS — D509 Iron deficiency anemia, unspecified: Secondary | ICD-10-CM

## 2023-05-03 MED ORDER — SODIUM CHLORIDE 0.9 % IV SOLN
INTRAVENOUS | Status: DC
  Filled 2023-05-03: qty 250

## 2023-05-03 MED ORDER — SODIUM CHLORIDE 0.9 % IV SOLN
200.0000 mg | Freq: Once | INTRAVENOUS | Status: AC
  Administered 2023-05-03: 200 mg via INTRAVENOUS
  Filled 2023-05-03: qty 200

## 2023-05-05 ENCOUNTER — Telehealth: Payer: Self-pay | Admitting: Physical Medicine and Rehabilitation

## 2023-05-05 NOTE — Progress Notes (Signed)
Alicia [name] - 77 y.o. female MRN [id number]  Date of birth: 1946/01/01  Office Visit Note: Visit Date: 04/27/2023 PCP: Mort Sawyers, FNP Referred by: Mort Sawyers, FNP  Subjective: Chief Complaint  Patient presents with   Lower Back - Pain   HPI:  Alicia [name] is a 77 y.o. female who comes in today at the request of Ellin Goodie, FNP for planned Midline L3-4 Lumbar Interlaminar epidural steroid injection with fluoroscopic guidance.  The patient has failed conservative care including home exercise, medications, time and activity modification.  This injection will be diagnostic and hopefully therapeutic.  Please see requesting physician notes for further details and justification.   ROS Otherwise per HPI.  Assessment & Plan: Visit Diagnoses:    ICD-10-CM   1. Lumbar radiculopathy  M54.16 XR C-ARM NO REPORT    Epidural Steroid injection    methylPREDNISolone acetate (DEPO-MEDROL) injection 80 mg      Plan: No additional findings.   Meds & Orders:  Meds ordered this encounter  Medications   methylPREDNISolone acetate (DEPO-MEDROL) injection 80 mg    Orders Placed This Encounter  Procedures   XR C-ARM NO REPORT   Epidural Steroid injection    Follow-up: Return for visit to requesting provider as needed.   Procedures: No procedures performed  Lumbar Epidural Steroid Injection - Interlaminar Approach with Fluoroscopic Guidance  Patient: Alicia [name]      Date of Birth: 10-27-1946 MRN: [id number] PCP: Mort Sawyers, FNP      Visit Date: 04/27/2023   Universal Protocol:     Consent Given By: the patient  Position: PRONE  Additional Comments: Vital signs were monitored before and after the procedure. Patient was prepped and draped in the usual sterile fashion. The correct patient, procedure, and site was verified.   Injection Procedure Details:   Procedure diagnoses: Lumbar radiculopathy [M54.16]   Meds Administered:  Meds ordered  this encounter  Medications   methylPREDNISolone acetate (DEPO-MEDROL) injection 80 mg     Laterality: Midline  Location/Site:  L3-4  Needle: 4.5 in., 20 ga. Tuohy  Needle Placement: Paramedian epidural  Findings:   -Comments: Excellent flow of contrast into the epidural space.  Procedure Details: Using a paramedian approach from the side mentioned above, the region overlying the inferior lamina was localized under fluoroscopic visualization and the soft tissues overlying this structure were infiltrated with 4 ml. of 1% Lidocaine without Epinephrine. The Tuohy needle was inserted into the epidural space using a paramedian approach.   The epidural space was localized using loss of resistance along with counter oblique bi-planar fluoroscopic views.  After negative aspirate for air, blood, and CSF, a 2 ml. volume of Isovue-250 was injected into the epidural space and the flow of contrast was observed. Radiographs were obtained for documentation purposes.    The injectate was administered into the level noted above.   Additional Comments:  The patient tolerated the procedure well Dressing: 2 x 2 sterile gauze and Band-Aid    Post-procedure details: Patient was observed during the procedure. Post-procedure instructions were reviewed.  Patient left the clinic in stable condition.   Clinical History: EXAM: MRI LUMBAR SPINE WITHOUT CONTRAST   TECHNIQUE: Multiplanar, multisequence MR imaging of the lumbar spine was performed. No intravenous contrast was administered.   COMPARISON:  MRI lumbar spine 02/23/2021, correlation is also made with lumbar spine radiographs 03/02/2023   FINDINGS: Segmentation: Partial sacralization of L5. The most inferior disc space is labeled L5-S1, consistent with the prior  exams.   Alignment: Scoliosis, with overall levoscoliosis of the lumbar spine. Straightening of the normal lumbar lordosis. Trace retrolisthesis of L1 on L2. Trace  anterolisthesis of L5 on S1. previously noted trace anterolisthesis of L3 on L4 is no longer appreciated.   Vertebrae: No acute or subacute fracture, evidence of discitis, or suspicious osseous lesion. Vertebral body heights appear unchanged compared to 02/23/2021 endplate degenerative changes at L2-L3 and L3-L4, eccentric to the right and L4-L5, eccentric to the left, likely related to the scoliosis.   Conus medullaris and cauda equina: Conus extends to the L1 level. Conus and cauda equina appear normal.   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 No lymphadenopathy. Atrophy of the inferior paraspinous muscles.   Disc levels:   T11-T12: Seen only on the sagittal images. Minimal disc bulge. No spinal canal stenosis or neural foraminal narrowing.   T12-L1: Minimal disc bulge. Mild facet arthropathy. No spinal canal stenosis or neural foraminal narrowing.   L1-L2: Trace retrolisthesis and mild disc bulge. Mild facet arthropathy. No spinal canal stenosis. Mild right neural foraminal narrowing, which is new from the prior exam.   L2-L3: Disc height loss with likely lateral listhesis at this level, with right eccentric disc osteophyte complex. Left foraminal disc protrusion. Moderate right and mild left facet arthropathy. Narrowing of the right lateral recess. No spinal canal stenosis. Severe right neural foraminal narrowing, which has progressed from the prior exam.   L3-L4: Mild disc bulge, eccentric to the right. Moderate right and mild left facet arthropathy. Narrowing of the lateral recesses. No spinal canal stenosis. Moderate to severe right neural foraminal narrowing, which has progressed from prior exam.   L4-L5: Disc height loss, with left eccentric disc bulge. Moderate facet arthropathy. Narrowing of the left lateral recess. No spinal canal stenosis. Severe left and mild right neural  foraminal narrowing, similar to the prior exam.   L5-S1: Grade 1 anterolisthesis with disc unroofing. Severe facet arthropathy. No spinal canal stenosis. Severe left and moderate right neural foraminal narrowing, similar to prior.   IMPRESSION: 1. No acute or subacute fracture. 2. L2-L3 severe right neural foraminal narrowing. 3. L3-L4 moderate to severe right neural foraminal narrowing. 4. L4-L5 severe left and mild right neural foraminal narrowing. 5. L5-S1 severe left and moderate right neural foraminal narrowing. 6. Narrowing of the lateral recesses on the right at L2-L3, bilaterally at L3-L4, and on the left at L4-L5, which could affect the descending L3, L4, and L5 nerve roots, respectively.     Electronically Signed   By: Wiliam Ke M.D.   On: 03/25/2023 14:54     Objective:  VS:  HT:    WT:   BMI:     BP:127/73  HR:83bpm  TEMP: ( )  RESP:  Physical Exam Vitals and nursing note reviewed.  Constitutional:      General: She is not in acute distress.    Appearance: Normal appearance. She is obese. She is not ill-appearing.  HENT:     Head: Normocephalic and atraumatic.     Right Ear: External ear normal.     Left Ear: External ear normal.  Eyes:     Extraocular Movements: Extraocular movements intact.  Cardiovascular:     Rate and Rhythm: Normal rate.     Pulses: Normal pulses.  Pulmonary:     Effort: Pulmonary effort is normal. No respiratory distress.  Abdominal:     General: There is  no distension.     Palpations: Abdomen is soft.  Musculoskeletal:        General: Tenderness present.     Cervical back: Neck supple.     Right lower leg: No edema.     Left lower leg: No edema.     Comments: Patient has good distal strength with no pain over the greater trochanters.  No clonus or focal weakness.  Skin:    Findings: No erythema, lesion or rash.  Neurological:     General: No focal deficit present.     Mental Status: She is alert and oriented to  person, place, and time.     Sensory: No sensory deficit.     Motor: No weakness or abnormal muscle tone.     Coordination: Coordination normal.  Psychiatric:        Mood and Affect: Mood normal.        Behavior: Behavior normal.      Imaging: No results found.

## 2023-05-05 NOTE — Procedures (Signed)
Lumbar Epidural Steroid Injection - Interlaminar Approach with Fluoroscopic Guidance  Patient: Alicia [name]      Date of Birth: 01-09-1946 MRN: [id number] PCP: Mort Sawyers, FNP      Visit Date: 04/27/2023   Universal Protocol:     Consent Given By: the patient  Position: PRONE  Additional Comments: Vital signs were monitored before and after the procedure. Patient was prepped and draped in the usual sterile fashion. The correct patient, procedure, and site was verified.   Injection Procedure Details:   Procedure diagnoses: Lumbar radiculopathy [M54.16]   Meds Administered:  Meds ordered this encounter  Medications   methylPREDNISolone acetate (DEPO-MEDROL) injection 80 mg     Laterality: Midline  Location/Site:  L3-4  Needle: 4.5 in., 20 ga. Tuohy  Needle Placement: Paramedian epidural  Findings:   -Comments: Excellent flow of contrast into the epidural space.  Procedure Details: Using a paramedian approach from the side mentioned above, the region overlying the inferior lamina was localized under fluoroscopic visualization and the soft tissues overlying this structure were infiltrated with 4 ml. of 1% Lidocaine without Epinephrine. The Tuohy needle was inserted into the epidural space using a paramedian approach.   The epidural space was localized using loss of resistance along with counter oblique bi-planar fluoroscopic views.  After negative aspirate for air, blood, and CSF, a 2 ml. volume of Isovue-250 was injected into the epidural space and the flow of contrast was observed. Radiographs were obtained for documentation purposes.    The injectate was administered into the level noted above.   Additional Comments:  The patient tolerated the procedure well Dressing: 2 x 2 sterile gauze and Band-Aid    Post-procedure details: Patient was observed during the procedure. Post-procedure instructions were reviewed.  Patient left the clinic in stable  condition.

## 2023-05-05 NOTE — Pre-Procedure Instructions (Signed)
Surgical Instructions    Your procedure is scheduled on July 9,2024.  Report to [facility] Main Entrance "A" at 5:30 A.M., then check in with the Admitting office.  Call this number if you have problems the morning of surgery:  [phone]  If you have any questions prior to your surgery date call [phone]: Open Monday-Friday 8am-4pm If you experience any cold or flu symptoms such as cough, fever, chills, shortness of breath, etc. between now and your scheduled surgery, please notify us at the above number.     Remember:  Do not eat after midnight the night before your surgery  You may drink clear liquids until 4:30 A. M. the morning of your surgery.   Clear liquids allowed are: Water, Non-Citrus Juices (without pulp), Carbonated Beverages, Clear Tea, Black Coffee Only (NO MILK, CREAM OR POWDERED CREAMER of any kind), and Gatorade.  Patient Instructions  The night before surgery:  No food after midnight. ONLY clear liquids after midnight  The day of surgery (if you do NOT have diabetes):  Drink ONE (1) Pre-Surgery Clear Ensure by 4:30 AM the morning of surgery. Drink in one sitting. Do not sip.  This drink was given to you during your hospital  pre-op appointment visit.  Nothing else to drink after completing the  Pre-Surgery Clear Ensure.         If you have questions, please contact your surgeon's office.      Take these medicines the morning of surgery with A SIP OF WATER   BREZTRI AEROSPHER  gabapentin (NEURONTIN)   omeprazole (PRILOSEC)   pramipexole (MIRAPEX              rosuvastatin (CRESTOR)   venlafaxine XR (EFFEXOR-XR) 75     IF NEEDED:  acetaminophen (TYLENOL)   albuterol (VENTOLIN HFA) 108 (90 Base) MCG/ACT inhaler    Follow your surgeon's instructions on when to stop Aspirin.  If no instructions were given by your surgeon then you will need to call the office to get those instructions.     As of today, STOP taking any Aleve, Naproxen, Ibuprofen,  Motrin, Advil, Goody's, BC's, all herbal medications, fish oil, and all vitamins.                     Do NOT Smoke (Tobacco/Vaping) for 24 hours prior to your procedure.  If you use a CPAP at night, you may bring your mask/headgear for your overnight stay.   Contacts, glasses, piercing's, hearing aid's, dentures or partials may not be worn into surgery, please bring cases for these belongings.    For patients admitted to the hospital, discharge time will be determined by your treatment team.   Patients discharged the day of surgery will not be allowed to drive home, and someone needs to stay with them for 24 hours.  SURGICAL WAITING ROOM VISITATION Patients having surgery or a procedure may have no more than 2 support people in the waiting area - these visitors may rotate.   Children under the age of 84 must have an adult with them who is not the patient. If the patient needs to stay at the hospital during part of their recovery, the visitor guidelines for inpatient rooms apply. Pre-op nurse will coordinate an appropriate time for 1 support person to accompany patient in pre-op.  This support person may not rotate.   Please refer to the [facility] website for the visitor guidelines for Inpatients (after your surgery is over and you are  in a regular room).    If you received a COVID test during your pre-op visit  it is requested that you wear a mask when out in public, stay away from anyone that may not be feeling well and notify your surgeon if you develop symptoms. If you have been in contact with anyone that has tested positive in the last 10 days please notify you surgeon.      Pre-operative 5 CHG Bath Instructions   You can play a key role in reducing the risk of infection after surgery. Your skin needs to be as free of germs as possible. You can reduce the number of germs on your skin by washing with CHG (chlorhexidine gluconate) soap before surgery. CHG is an antiseptic soap that  kills germs and continues to kill germs even after washing.   DO NOT use if you have an allergy to chlorhexidine/CHG or antibacterial soaps. If your skin becomes reddened or irritated, stop using the CHG and notify one of our RNs at [phone].   Please shower with the CHG soap starting 4 days before surgery using the following schedule:     Please keep in mind the following:  DO NOT shave, including legs and underarms, starting the day of your first shower.   You may shave your face at any point before/day of surgery.   Place clean sheets on your bed the day you start using CHG soap. Use a clean washcloth (not used since being washed) for each shower. DO NOT sleep with pets once you start using the CHG.   CHG Shower Instructions:  If you choose to wash your hair and private area, wash first with your normal shampoo/soap.  After you use shampoo/soap, rinse your hair and body thoroughly to remove shampoo/soap residue.  Turn the water OFF and apply about 3 tablespoons (45 ml) of CHG soap to a CLEAN washcloth.  Apply CHG soap ONLY FROM YOUR NECK DOWN TO YOUR TOES (washing for 3-5 minutes)  DO NOT use CHG soap on face, private areas, open wounds, or sores.  Pay special attention to the area where your surgery is being performed.  If you are having back surgery, having someone wash your back for you may be helpful. Wait 2 minutes after CHG soap is applied, then you may rinse off the CHG soap.  Pat dry with a clean towel  Put on clean clothes/pajamas   If you choose to wear lotion, please use ONLY the CHG-compatible lotions on the back of this paper.     Additional instructions for the day of surgery: DO NOT APPLY any lotions, deodorants, cologne, or perfumes.   Do not wear jewelry or makeup Do not wear lotions, powders, perfumes/colognes, or deodorant. Do not wear nail polish, gel polish, artificial nails, or any other type of covering on natural nails (fingers and toes) Do not bring  valuables to the hospital.  [facility] is not responsible for any belongings or valuables. Put on clean/comfortable clothes.  Brush your teeth.  Ask your nurse before applying any prescription medications to the skin.      CHG Compatible Lotions   Aveeno Moisturizing lotion  Cetaphil Moisturizing Cream  Cetaphil Moisturizing Lotion  Clairol Herbal Essence Moisturizing Lotion, Dry Skin  Clairol Herbal Essence Moisturizing Lotion, Extra Dry Skin  Clairol Herbal Essence Moisturizing Lotion, Normal Skin  Curel Age Defying Therapeutic Moisturizing Lotion with Alpha Hydroxy  Curel Extreme Care Body Lotion  Curel Soothing Hands Moisturizing Hand Lotion  Curel  Therapeutic Moisturizing Cream, Fragrance-Free  Curel Therapeutic Moisturizing Lotion, Fragrance-Free  Curel Therapeutic Moisturizing Lotion, Original Formula  Eucerin Daily Replenishing Lotion  Eucerin Dry Skin Therapy Plus Alpha Hydroxy Crme  Eucerin Dry Skin Therapy Plus Alpha Hydroxy Lotion  Eucerin Original Crme  Eucerin Original Lotion  Eucerin Plus Crme Eucerin Plus Lotion  Eucerin TriLipid Replenishing Lotion  Keri Anti-Bacterial Hand Lotion  Keri Deep Conditioning Original Lotion Dry Skin Formula Softly Scented  Keri Deep Conditioning Original Lotion, Fragrance Free Sensitive Skin Formula  Keri Lotion Fast Absorbing Fragrance Free Sensitive Skin Formula  Keri Lotion Fast Absorbing Softly Scented Dry Skin Formula  Keri Original Lotion  Keri Skin Renewal Lotion Keri Silky Smooth Lotion  Keri Silky Smooth Sensitive Skin Lotion  Nivea Body Creamy Conditioning Oil  Nivea Body Extra Enriched Lotion  Nivea Body Original Lotion  Nivea Body Sheer Moisturizing Lotion Nivea Crme  Nivea Skin Firming Lotion  NutraDerm 30 Skin Lotion  NutraDerm Skin Lotion  NutraDerm Therapeutic Skin Cream  NutraDerm Therapeutic Skin Lotion  ProShield Protective Hand Cream  Provon moisturizing lotion     Please read over the  following fact sheets that you were given.

## 2023-05-05 NOTE — Telephone Encounter (Signed)
Patient called advised the injection has completely stopped working. Patient asked if [place] would call her back. The number to contact patient is [phone]

## 2023-05-05 NOTE — Progress Notes (Signed)
Surgical Instructions    Your procedure is scheduled on July 9,2024.  Report to [facility] Main Entrance "A" at 5:30 A.M., then check in with the Admitting office.  Call this number if you have problems the morning of surgery:  [phone]  If you have any questions prior to your surgery date call [phone]: Open Monday-Friday 8am-4pm If you experience any cold or flu symptoms such as cough, fever, chills, shortness of breath, etc. between now and your scheduled surgery, please notify us at the above number.     Remember:  Do not eat after midnight the night before your surgery  You may drink clear liquids until 4:30 A. M. the morning of your surgery.   Clear liquids allowed are: Water, Non-Citrus Juices (without pulp), Carbonated Beverages, Clear Tea, Black Coffee Only (NO MILK, CREAM OR POWDERED CREAMER of any kind), and Gatorade.     Take these medicines the morning of surgery with A SIP OF WATER   BREZTRI AEROSPHER  gabapentin (NEURONTIN)   omeprazole (PRILOSEC)   pramipexole (MIRAPEX              rosuvastatin (CRESTOR)   venlafaxine XR (EFFEXOR-XR) 75     IF NEEDED:  acetaminophen (TYLENOL)   albuterol (VENTOLIN HFA) 108 (90 Base) MCG/ACT inhaler    As of today, STOP taking any Aleve, Naproxen, Ibuprofen, Motrin, Advil, Goody's, BC's, all herbal medications, fish oil, and all vitamins.                     Do NOT Smoke (Tobacco/Vaping) for 24 hours prior to your procedure.  If you use a CPAP at night, you may bring your mask/headgear for your overnight stay.   Contacts, glasses, piercing's, hearing aid's, dentures or partials may not be worn into surgery, please bring cases for these belongings.    For patients admitted to the hospital, discharge time will be determined by your treatment team.   Patients discharged the day of surgery will not be allowed to drive home, and someone needs to stay with them for 24 hours.  SURGICAL WAITING ROOM VISITATION Patients  having surgery or a procedure may have no more than 2 support people in the waiting area - these visitors may rotate.   Children under the age of 16 must have an adult with them who is not the patient. If the patient needs to stay at the hospital during part of their recovery, the visitor guidelines for inpatient rooms apply. Pre-op nurse will coordinate an appropriate time for 1 support person to accompany patient in pre-op.  This support person may not rotate.   Please refer to the [facility] website for the visitor guidelines for Inpatients (after your surgery is over and you are in a regular room).    Special instructions:      Pre-operative 5 CHG Bath Instructions   You can play a key role in reducing the risk of infection after surgery. Your skin needs to be as free of germs as possible. You can reduce the number of germs on your skin by washing with CHG (chlorhexidine gluconate) soap before surgery. CHG is an antiseptic soap that kills germs and continues to kill germs even after washing.   DO NOT use if you have an allergy to chlorhexidine/CHG or antibacterial soaps. If your skin becomes reddened or irritated, stop using the CHG and notify one of our RNs at [phone].   Please shower with the CHG soap starting 4 days before surgery  using the following schedule:     Please keep in mind the following:  DO NOT shave, including legs and underarms, starting the day of your first shower.   You may shave your face at any point before/day of surgery.   Place clean sheets on your bed the day you start using CHG soap. Use a clean washcloth (not used since being washed) for each shower. DO NOT sleep with pets once you start using the CHG.   CHG Shower Instructions:  If you choose to wash your hair and private area, wash first with your normal shampoo/soap.  After you use shampoo/soap, rinse your hair and body thoroughly to remove shampoo/soap residue.  Turn the water OFF and apply  about 3 tablespoons (45 ml) of CHG soap to a CLEAN washcloth.  Apply CHG soap ONLY FROM YOUR NECK DOWN TO YOUR TOES (washing for 3-5 minutes)  DO NOT use CHG soap on face, private areas, open wounds, or sores.  Pay special attention to the area where your surgery is being performed.  If you are having back surgery, having someone wash your back for you may be helpful. Wait 2 minutes after CHG soap is applied, then you may rinse off the CHG soap.  Pat dry with a clean towel  Put on clean clothes/pajamas   If you choose to wear lotion, please use ONLY the CHG-compatible lotions on the back of this paper.     Additional instructions for the day of surgery: DO NOT APPLY any lotions, deodorants, cologne, or perfumes.   Do not wear jewelry or makeup Do not wear lotions, powders, perfumes/colognes, or deodorant. Do not wear nail polish, gel polish, artificial nails, or any other type of covering on natural nails (fingers and toes) Do not bring valuables to the hospital.  [facility] is not responsible for any belongings or valuables. Put on clean/comfortable clothes.  Brush your teeth.  Ask your nurse before applying any prescription medications to the skin.      CHG Compatible Lotions   Aveeno Moisturizing lotion  Cetaphil Moisturizing Cream  Cetaphil Moisturizing Lotion  Clairol Herbal Essence Moisturizing Lotion, Dry Skin  Clairol Herbal Essence Moisturizing Lotion, Extra Dry Skin  Clairol Herbal Essence Moisturizing Lotion, Normal Skin  Curel Age Defying Therapeutic Moisturizing Lotion with Alpha Hydroxy  Curel Extreme Care Body Lotion  Curel Soothing Hands Moisturizing Hand Lotion  Curel Therapeutic Moisturizing Cream, Fragrance-Free  Curel Therapeutic Moisturizing Lotion, Fragrance-Free  Curel Therapeutic Moisturizing Lotion, Original Formula  Eucerin Daily Replenishing Lotion  Eucerin Dry Skin Therapy Plus Alpha Hydroxy Crme  Eucerin Dry Skin Therapy Plus Alpha Hydroxy  Lotion  Eucerin Original Crme  Eucerin Original Lotion  Eucerin Plus Crme Eucerin Plus Lotion  Eucerin TriLipid Replenishing Lotion  Keri Anti-Bacterial Hand Lotion  Keri Deep Conditioning Original Lotion Dry Skin Formula Softly Scented  Keri Deep Conditioning Original Lotion, Fragrance Free Sensitive Skin Formula  Keri Lotion Fast Absorbing Fragrance Free Sensitive Skin Formula  Keri Lotion Fast Absorbing Softly Scented Dry Skin Formula  Keri Original Lotion  Keri Skin Renewal Lotion Keri Silky Smooth Lotion  Keri Silky Smooth Sensitive Skin Lotion  Nivea Body Creamy Conditioning Oil  Nivea Body Extra Enriched Teacher, adult education Moisturizing Lotion Nivea Crme  Nivea Skin Firming Lotion  NutraDerm 30 Skin Lotion  NutraDerm Skin Lotion  NutraDerm Therapeutic Skin Cream  NutraDerm Therapeutic Skin Lotion  ProShield Protective Hand Cream  Provon moisturizing lotion  [place]-  Preparing For Surgery  Before surgery, you can play an important role. Because skin is not sterile, your skin needs to be as free of germs as possible. You can reduce the number of germs on your skin by washing with CHG (chlorahexidine gluconate) Soap before surgery.  CHG is an antiseptic cleaner which kills germs and bonds with the skin to continue killing germs even after washing.    Oral Hygiene is also important to reduce your risk of infection.  Remember - BRUSH YOUR TEETH THE MORNING OF SURGERY WITH YOUR REGULAR TOOTHPASTE  Please do not use if you have an allergy to CHG or antibacterial soaps. If your skin becomes reddened/irritated stop using the CHG.  Do not shave (including legs and underarms) for at least 48 hours prior to first CHG shower. It is OK to shave your face.  Please follow these instructions carefully.   Shower the NIGHT BEFORE SURGERY and the MORNING OF SURGERY  If you chose to wash your hair, wash your hair first as usual with your normal  shampoo.  After you shampoo, rinse your hair and body thoroughly to remove the shampoo.  Use CHG Soap as you would any other liquid soap. You can apply CHG directly to the skin and wash gently with a scrungie or a clean washcloth.   Apply the CHG Soap to your body ONLY FROM THE NECK DOWN.  Do not use on open wounds or open sores. Avoid contact with your eyes, ears, mouth and genitals (private parts). Wash Face and genitals (private parts)  with your normal soap.   Wash thoroughly, paying special attention to the area where your surgery will be performed.  Thoroughly rinse your body with warm water from the neck down.  DO NOT shower/wash with your normal soap after using and rinsing off the CHG Soap.  Pat yourself dry with a CLEAN TOWEL.  Wear CLEAN PAJAMAS to bed the night before surgery  Place CLEAN SHEETS on your bed the night before your surgery  DO NOT SLEEP WITH PETS.    Please read over the following fact sheets that you were given.    If you received a COVID test during your pre-op visit  it is requested that you wear a mask when out in public, stay away from anyone that may not be feeling well and notify your surgeon if you develop symptoms. If you have been in contact with anyone that has tested positive in the last 10 days please notify you surgeon.

## 2023-05-06 ENCOUNTER — Other Ambulatory Visit: Payer: Self-pay

## 2023-05-06 ENCOUNTER — Encounter (HOSPITAL_COMMUNITY)
Admission: RE | Admit: 2023-05-06 | Discharge: 2023-05-06 | Disposition: A | Discharge status: Home / Self Care | Payer: PPO | Source: Ambulatory Visit | Attending: Orthopedic Surgery | Admitting: Orthopedic Surgery

## 2023-05-06 ENCOUNTER — Encounter (HOSPITAL_COMMUNITY): Payer: Self-pay

## 2023-05-06 VITALS — BP 121/62 | HR 72 | Temp 98.4°F | Resp 18 | Ht 60.0 in | Wt 198.0 lb

## 2023-05-06 DIAGNOSIS — F32A Depression, unspecified: Secondary | ICD-10-CM | POA: Diagnosis not present

## 2023-05-06 DIAGNOSIS — I739 Peripheral vascular disease, unspecified: Secondary | ICD-10-CM | POA: Insufficient documentation

## 2023-05-06 DIAGNOSIS — K5730 Diverticulosis of large intestine without perforation or abscess without bleeding: Secondary | ICD-10-CM | POA: Diagnosis not present

## 2023-05-06 DIAGNOSIS — G4733 Obstructive sleep apnea (adult) (pediatric): Secondary | ICD-10-CM | POA: Diagnosis not present

## 2023-05-06 DIAGNOSIS — K648 Other hemorrhoids: Secondary | ICD-10-CM | POA: Diagnosis not present

## 2023-05-06 DIAGNOSIS — I700 Atherosclerosis of aorta: Secondary | ICD-10-CM | POA: Insufficient documentation

## 2023-05-06 DIAGNOSIS — R9431 Abnormal electrocardiogram [ECG] [EKG]: Secondary | ICD-10-CM | POA: Diagnosis not present

## 2023-05-06 DIAGNOSIS — K449 Diaphragmatic hernia without obstruction or gangrene: Secondary | ICD-10-CM | POA: Diagnosis not present

## 2023-05-06 DIAGNOSIS — Z01818 Encounter for other preprocedural examination: Secondary | ICD-10-CM | POA: Insufficient documentation

## 2023-05-06 DIAGNOSIS — I6523 Occlusion and stenosis of bilateral carotid arteries: Secondary | ICD-10-CM | POA: Diagnosis not present

## 2023-05-06 DIAGNOSIS — G629 Polyneuropathy, unspecified: Secondary | ICD-10-CM | POA: Insufficient documentation

## 2023-05-06 DIAGNOSIS — Z85828 Personal history of other malignant neoplasm of skin: Secondary | ICD-10-CM | POA: Insufficient documentation

## 2023-05-06 DIAGNOSIS — K635 Polyp of colon: Secondary | ICD-10-CM | POA: Insufficient documentation

## 2023-05-06 DIAGNOSIS — I714 Abdominal aortic aneurysm, without rupture, unspecified: Secondary | ICD-10-CM | POA: Diagnosis not present

## 2023-05-06 DIAGNOSIS — K219 Gastro-esophageal reflux disease without esophagitis: Secondary | ICD-10-CM | POA: Insufficient documentation

## 2023-05-06 DIAGNOSIS — I2510 Atherosclerotic heart disease of native coronary artery without angina pectoris: Secondary | ICD-10-CM | POA: Insufficient documentation

## 2023-05-06 DIAGNOSIS — K31819 Angiodysplasia of stomach and duodenum without bleeding: Secondary | ICD-10-CM | POA: Diagnosis not present

## 2023-05-06 DIAGNOSIS — R609 Edema, unspecified: Secondary | ICD-10-CM | POA: Diagnosis not present

## 2023-05-06 DIAGNOSIS — Z87891 Personal history of nicotine dependence: Secondary | ICD-10-CM | POA: Insufficient documentation

## 2023-05-06 DIAGNOSIS — M19012 Primary osteoarthritis, left shoulder: Secondary | ICD-10-CM | POA: Diagnosis not present

## 2023-05-06 DIAGNOSIS — D509 Iron deficiency anemia, unspecified: Secondary | ICD-10-CM | POA: Diagnosis not present

## 2023-05-06 HISTORY — DX: Peripheral vascular disease, unspecified: I73.9

## 2023-05-06 HISTORY — DX: Pneumonia, unspecified organism: J18.9

## 2023-05-06 HISTORY — DX: Polyneuropathy, unspecified: G62.9

## 2023-05-06 LAB — URINALYSIS, W/ REFLEX TO CULTURE (INFECTION SUSPECTED)
Bacteria, UA: NONE SEEN
Bilirubin Urine: NEGATIVE
Glucose, UA: NEGATIVE mg/dL
Hgb urine dipstick: NEGATIVE
Ketones, ur: NEGATIVE mg/dL
Leukocytes,Ua: NEGATIVE
Nitrite: NEGATIVE
Protein, ur: NEGATIVE mg/dL
Specific Gravity, Urine: 1.006 (ref 1.005–1.030)
pH: 6 (ref 5.0–8.0)

## 2023-05-06 LAB — BASIC METABOLIC PANEL
Anion gap: 10 (ref 5–15)
BUN: 15 mg/dL (ref 8–23)
CO2: 25 mmol/L (ref 22–32)
Calcium: 9.5 mg/dL (ref 8.9–10.3)
Chloride: 103 mmol/L (ref 98–111)
Creatinine, Ser: 0.72 mg/dL (ref 0.44–1.00)
GFR, Estimated: >60 mL/min (ref >60)
Glucose, Bld: 93 mg/dL (ref 70–99)
Potassium: 4.6 mmol/L (ref 3.5–5.1)
Sodium: 138 mmol/L (ref 135–145)

## 2023-05-06 LAB — CBC
HCT: 40.9 % (ref 36.0–46.0)
Hemoglobin: 13 g/dL (ref 12.0–15.0)
MCH: 28.6 pg (ref 26.0–34.0)
MCHC: 31.8 g/dL (ref 30.0–36.0)
MCV: 89.9 fL (ref 80.0–100.0)
Platelets: 199 10*3/uL (ref 150–400)
RBC: 4.55 MIL/uL (ref 3.87–5.11)
RDW: 16.1 % — ABNORMAL HIGH (ref 11.5–15.5)
WBC: 8.4 10*3/uL (ref 4.0–10.5)
nRBC: 0 % (ref 0.0–0.2)

## 2023-05-06 LAB — SURGICAL PCR SCREEN
MRSA, PCR: NEGATIVE
Staphylococcus aureus: NEGATIVE

## 2023-05-06 NOTE — Progress Notes (Signed)
PCP - Mort Sawyers, FNP Cardiologist - Denies Pulmonologist - Dr. 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 Not aware of pressure settings.  No DM  Last dose of GLP1 agonist- n/a GLP1 instructions: n/a  Blood Thinner Instructions: n/a Aspirin Instructions: Pt will hold ASA 7 days prior to surgery. Last dose will be July 1st.  ERAS Protcol - Clear liquids until 0430 morning of surgery PRE-SURGERY Ensure or G2- Ensure given to pt with instructions  COVID TEST- n/a   Anesthesia review: Yes. PVD with Mesenteric Stent placed 2023 by Dr. Randie Heinz. 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 Last CT Abdomen April 2023. Abnormal EKG review.   Patient denies shortness of breath, fever, cough and chest pain at PAT appointment. Pt denies any respiratory illness/infection in the last two months.   All instructions explained to the patient, with a verbal understanding of the material. Patient agrees to go over the instructions while at home for a better understanding. Patient also instructed to self quarantine after being tested for COVID-19. The opportunity to ask questions was provided.

## 2023-05-06 NOTE — Telephone Encounter (Signed)
LVM to return call to clinic.

## 2023-05-07 ENCOUNTER — Other Ambulatory Visit: Payer: Self-pay | Admitting: Family

## 2023-05-07 ENCOUNTER — Inpatient Hospital Stay: Payer: PPO

## 2023-05-07 VITALS — BP 122/58 | HR 70 | Temp 98.7°F | Resp 18

## 2023-05-07 DIAGNOSIS — D509 Iron deficiency anemia, unspecified: Secondary | ICD-10-CM | POA: Diagnosis not present

## 2023-05-07 MED ORDER — SODIUM CHLORIDE 0.9 % IV SOLN
INTRAVENOUS | Status: DC
  Filled 2023-05-07: qty 250

## 2023-05-07 MED ORDER — SODIUM CHLORIDE 0.9 % IV SOLN
200.0000 mg | Freq: Once | INTRAVENOUS | Status: AC
  Administered 2023-05-07: 200 mg via INTRAVENOUS
  Filled 2023-05-07: qty 200

## 2023-05-07 NOTE — Progress Notes (Signed)
Anesthesia Chart Review:  Case: [id number] Date/Time: 05/18/23 0715   Procedures:      LEFT REVERSE SHOULDER ARTHROPLASTY (Left: Shoulder)     BICEPS TENODESIS (Left: Shoulder)   Anesthesia type: General   Pre-op diagnosis: left shoulder rotator cuff arthropathy   [facility]: [facility] ROOM 06 / [facility]   Surgeons: Cammy Copa, MD       DISCUSSION: Patient is a 77 year old female scheduled for the above procedure.    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  She last saw hematologist Dr. Orlie Dakin on 04/29/23 for iron deficiency anemia follow-up. IDA possibly secondary to non-bleeding angiectasias noted in her small intestines, s/p ATP 12/17/22 by GI Dr. Leonides Schanz. HGB has normalized, but iron stores remained decreased. S/p iron infusion 05/07/23. H/H 13.0/40.9 on 05/06/23.   Last visit with vascular surgeon Dr. Randie Heinz was on 06/24/22, s/p celiac artery stent 05/18/22. He recommended DAPT through 07/30/22 then could remain on ASA and statin therapy for mesenteric disease.  She has a 3.2 AAA by 03/05/22 CTA with 3 year follow-up recommended.  She is not routinely followed by cardiology, but she had an echo in 06/2021 as part of a preoperative cardiology evaluation by Dr. Clelia Croft at [facility] prior to her 07/2021 back surgery.   Reported her last ASA is scheduled for 05/10/23. Anesthesia team to evaluate on the day of surgery.   VS: BP 121/62   Pulse 72   Temp 36.9 C   Resp 18   Ht 5' (1.524 m)   Wt 89.8 kg   SpO2 95%   BMI 38.67 kg/m    PROVIDERS: Mort Sawyers, FNP is PCP  - Sandrea Hughs, MD is pulmonologist.  Last visit 01/29/23. He wrote that she did not meet criteria for COPD, but had done great on Breztri, so would continue. Ok to try albuterol 15 minutes before activity (on alternating days). Six month follow-up planned.  Lemar Livings, MD is vascular surgeon - Norwood Levo, MD is GI - Gerarda Fraction, MD is HEM. Last visit 04/29/23 for IDA follow-up.  - She is not routinely followed by a cardiologist, but she had  a preoperative cardiology evaluation by Martha Clan, MD with [facility] ~ 06/2021 prior to 07/21/22 spinal surgery.      LABS: Labs reviewed: Acceptable for surgery. (all labs ordered are listed, but only abnormal results are displayed)  Labs Reviewed  URINALYSIS, W/ REFLEX TO CULTURE (INFECTION SUSPECTED) - Abnormal; Notable for the following components:      Result Value   Color, Urine STRAW (*)    All other components within normal limits  CBC - Abnormal; Notable for the following components:   RDW 16.1 (*)    All other components within normal limits  SURGICAL PCR SCREEN  URINE CULTURE  BASIC METABOLIC PANEL    OTHER: PFT's  01/28/23:  "FEV1 2.02 (120 % ) ratio 0.77  p 16 % improvement from saba p 0 prior to study with    and FV curve very min concavity with variable truncation of insp loop"   EGD 12/17/22: IMPRESSION: - Normal esophagus. - Hiatal hernia. - Erythematous mucosa in the antrum. 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  Treated with argon plasma coagulation (APC). - Four non-bleeding angioectasias in the jejunum. Treated with argon plasma coagulation (APC). - No specimens collected.  Colonoscopy 05/08/22: IMPRESSION: - Three 3 to 4 mm polyps in the transverse colon, removed with a cold snare. Resected and retrieved. [Tubular adenomas without high grade dysplasia or malignancy, hyperplastic polyp] - Diverticulosis in the sigmoid colon. - Non-bleeding internal hemorrhoids. - Biopsies were taken with a cold forceps from the entire colon for  evaluation of microscopic colitis. [No specific histopathologic changes, negative of acute inflammation, increased intraepithelial lymphocytes or thickened subepithelial collagen table]  Splint Night Sleep Study 10/09/20 [facility]): IMPRESSION: Moderate obstructive sleep apnea hypopnea syndrome with an elevated AHI of 25.9 (normal < 5).  2. Hypoxemia, from apneas and hypopneas.  Normal sinus rhythm. Periodic limb movements of sleep.     IMAGES: CT Left Shoulder 04/26/23: IMPRESSION: 1. Severe osteoarthritis of the left glenohumeral joint with bone-on-bone articulation and subchondral cystic changes. 2. Moderate acromioclavicular osteoarthritis. 3. No evidence of rotator cuff tendon tear.   MRI Pelvis 03/21/23: IMPRESSION: 1. Prominent marrow edema throughout the left sacral ala may reflect a superimposed acute insufficiency fracture. 2. Chronic fractures of the sacrum bilaterally and the right superior and inferior pubic rami with chronic nonunion. No other acute osseous findings. 3. Moderately advanced sacroiliac degenerative changes bilaterally. 4. Lumbar spine findings are dictated separately. 5. Muscular atrophy and gluteus and common hamstring tendinosis as described.   MRI L-spine 03/21/23: IMPRESSION: 1. No acute or subacute fracture. 2. L2-L3 severe right neural foraminal narrowing. 3. L3-L4 moderate to severe right neural foraminal narrowing. 4. L4-L5 severe left and mild right neural foraminal narrowing. 5. L5-S1 severe left and moderate right neural foraminal narrowing. 6. Narrowing of the lateral recesses on the right at L2-L3, bilaterally at L3-L4, and on the left at L4-L5, which could affect the descending L3, L4, and L5 nerve roots, respectively.  [place] Mesenteric 12/30/22: Summary:  Mesenteric:  - Celiac artery stent is patent with 70 to 99% stenosis in the celiac artery, however could not clearly visualize stent walls.  - Elevated velocities suggest  >70% common hepatic artery stenosis.  - Unable to adequately visualize SMA and IMA secondary to overlying bowel gas.  - Incidental finding: 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     CT Chest LCS 03/02/22: IMPRESSION: 1. Lung-RADS 2, benign appearance or behavior. Continue annual screening with low-dose chest CT without contrast in 12 months. 2. Coronary artery calcifications. 3. Aortic Atherosclerosis (ICD10-I70.0) and Emphysema (ICD10-J43.9).     EKG: 05/06/23: Normal sinus rhythm Inferior infarct , age undetermined Anteroseptal infarct , age undetermined Abnormal ECG When compared with ECG of 21-Jul-2021 07:14, PREVIOUS ECG IS PRESENT No significant change since last tracing Confirmed by Arvilla Meres ([id number]) on 05/06/2023 10:43:14 PM    CV: Echo 06/02/2021 [facility]; Media tab, Correspondence 07/22/21): 1.  LV size normal.  Mild concentric LVH.  Overall LV systolic function normal with a EF 55-60% 2.  LA normal in size and function  3.  RV normal in size and function. 4.  RA normal in size and function 5.  Mild aortic valve sclerosis without stenosis 6.  Mitral valve thickened with nodular degeneration.  No mitral regurgitation. 7.  Tricuspid valve appears structurally normal with normal function 8.  No pericardial effusion 9.  Aortic root, ascending aorta, and aortic arch are normal.     Carotid duplex 04/25/21:  - Right Carotid: Velocities  in the right ICA are consistent with a 1-39% stenosis.  - Left Carotid: Velocities in the left ICA are consistent with a 1-39% stenosis.  - Vertebrals:  Bilateral vertebral arteries demonstrate antegrade flow.  - Subclavians: Normal flow hemodynamics were seen in bilateral subclavian arteries.      Reported a stress test > 10 years ago.    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   R mid forearm, [facility]    Past Surgical History:  Procedure Laterality Date   APPENDECTOMY  10/2016   BREAST BIOPSY Right 2022   CELIAC ARTERY STENT     CHOLECYSTECTOMY  10/2016   COLONOSCOPY     ENTEROSCOPY N/A 12/17/2022   Procedure: ENTEROSCOPY;  Surgeon: Imogene Burn, MD;  Location: [facility];  Service: Gastroenterology;  Laterality: N/A;  small bowel enteroscopy   EYE SURGERY Bilateral 2017   cataracts removed   HOT HEMOSTASIS  12/17/2022   Procedure: HOT HEMOSTASIS (ARGON PLASMA COAGULATION/BICAP);  Surgeon: Imogene Burn, MD;  Location: [facility];  Service: Gastroenterology;;   LUMBAR LAMINECTOMY/DECOMPRESSION MICRODISCECTOMY Left 07/21/2021   Procedure: Laminectomy and Foraminotomy - L4-L5 - left;  Surgeon: Donalee Citrin, MD;  Location: [facility] OR;  Service: Neurosurgery;  Laterality: Left;  3C   PAROTID GLAND TUMOR EXCISION Left 2003   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   Procedure: PERIPHERAL VASCULAR INTERVENTION;  Surgeon: Maeola Harman, MD;  Location: [facility] INVASIVE CV LAB;  Service: Cardiovascular;;  celiac   TOTAL SHOULDER ARTHROPLASTY Right 08/06/2022   Procedure: RIGHT REVERSE SHOULDER ARTHROPLASTY;  Surgeon: Cammy Copa, MD;  Location: [facility] OR;  Service: Orthopedics;  Laterality: Right;   VISCERAL ANGIOGRAPHY N/A 05/18/2022   Procedure: VISCERAL ANGIOGRAPHY;  Surgeon: Maeola Harman, MD;  Location: [facility] INVASIVE CV LAB;  Service: Cardiovascular;  Laterality: N/A;    MEDICATIONS:  acetaminophen (TYLENOL) 500 MG tablet   albuterol (VENTOLIN HFA)  108 (90 Base) MCG/ACT inhaler   aspirin EC 81 MG tablet   BREZTRI AEROSPHERE 160-9-4.8 MCG/ACT AERO   Cholecalciferol (VITAMIN D3) 125 MCG (5000 UT) CAPS   faricimab-svoa (VABYSMO) 6 MG/0.05ML SOLN intravitreal injection   gabapentin (NEURONTIN) 300 MG capsule   hydrocortisone 2.5 % cream   Lutein 20 MG TABS   Magnesium 400 MG TABS   omeprazole (PRILOSEC) 40 MG capsule   Polyvinyl Alcohol-Povidone (REFRESH OP)   pramipexole (MIRAPEX) 0.5 MG tablet   rosuvastatin (CRESTOR) 10 MG tablet   venlafaxine XR (EFFEXOR-XR) 75 MG 24 hr capsule   No current facility-administered medications for this encounter.    0.9 %  sodium chloride infusion   iron sucrose (VENOFER) 200 mg in sodium chloride 0.9 % 100 mL IVPB    Shonna Chock, PA-C Surgical Short Stay/Anesthesiology [facility] Phone [phone] [facility] Phone [phone] 05/07/2023 4:13 PM

## 2023-05-07 NOTE — Progress Notes (Signed)
Pt has been educated and understands. Pt declined to stay 30 mins after iron infusion. VSS.  

## 2023-05-07 NOTE — Anesthesia Preprocedure Evaluation (Addendum)
Anesthesia Evaluation  Patient identified by MRN, date of birth, ID band Patient awake    Reviewed: Allergy & Precautions, H&P , NPO status , Patient's Chart, lab work & pertinent test results  Airway Mallampati: II  TM Distance: >3 FB Neck ROM: Full    Dental no notable dental hx. (+) Edentulous Upper, Partial Lower, Dental Advisory Given   Pulmonary asthma , sleep apnea and Continuous Positive Airway Pressure Ventilation , COPD,  COPD inhaler, Current SmokerPatient did not abstain from smoking.   Pulmonary exam normal breath sounds clear to auscultation       Cardiovascular Exercise Tolerance: Good + Peripheral Vascular Disease  negative cardio ROS  Rhythm:Regular Rate:Normal     Neuro/Psych    Depression    negative neurological ROS     GI/Hepatic Neg liver ROS, hiatal hernia,GERD  Medicated,,  Endo/Other  negative endocrine ROS    Renal/GU negative Renal ROS  negative genitourinary   Musculoskeletal  (+) Arthritis , Osteoarthritis,    Abdominal   Peds  Hematology negative hematology ROS (+) Blood dyscrasia, anemia   Anesthesia Other Findings   Reproductive/Obstetrics negative OB ROS                             Anesthesia Physical Anesthesia Plan  ASA: 3  Anesthesia Plan: General   Post-op Pain Management: Regional block* and Tylenol PO (pre-op)*   Induction: Intravenous  PONV Risk Score and Plan: 3 and Ondansetron, Dexamethasone and Treatment may vary due to age or medical condition  Airway Management Planned: Oral ETT  Additional Equipment:   Intra-op Plan:   Post-operative Plan: Extubation in OR  Informed Consent: I have reviewed the patients History and Physical, chart, labs and discussed the procedure including the risks, benefits and alternatives for the proposed anesthesia with the patient or authorized representative who has indicated his/her understanding and  acceptance.     Dental advisory given  Plan Discussed with: CRNA  Anesthesia Plan Comments: (PAT note written 05/07/2023 by Shonna Chock, PA-C.  )       Anesthesia Quick Evaluation

## 2023-05-09 LAB — URINE CULTURE: Culture: <10000 — AB

## 2023-05-10 ENCOUNTER — Telehealth: Payer: Self-pay | Admitting: Orthopedic Surgery

## 2023-05-10 NOTE — Telephone Encounter (Signed)
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 Patient asked for a call back as soon as possible. The number to contact patient is  [phone]

## 2023-05-11 NOTE — Telephone Encounter (Signed)
lmom 

## 2023-05-17 ENCOUNTER — Telehealth: Payer: Self-pay | Admitting: *Deleted

## 2023-05-17 NOTE — Care Plan (Signed)
[facility] RNCM call to patient and discussed her upcoming Left Reverse shoulder arthroplasty with Dr. August Saucer on 05/18/23. She is an ortho bundle patient through [facility] and is agreeable to case management. She has a niece that will be assisting at home after discharge. She did decline her shoulder brace from [employer] stating she really didn't use it much after her last shoulder surgery. Updated Dr. Diamantina Providence staff. She requested having HHPT instead of going to OPPT and has requested [facility]- specifically the therapist that treated her after her other shoulder replacement. Will make referral for this to start at 1 week post op. Reviewed post op care instructions. SANE question answered by patient of 30%. Will continue to follow for needs.

## 2023-05-17 NOTE — Telephone Encounter (Signed)
Ortho bundle pre-op call completed. 

## 2023-05-18 ENCOUNTER — Other Ambulatory Visit: Payer: Self-pay

## 2023-05-18 ENCOUNTER — Observation Stay (HOSPITAL_COMMUNITY)
Admission: RE | Admit: 2023-05-18 | Discharge: 2023-05-18 | Disposition: A | Discharge status: Home / Self Care | Payer: PPO | Attending: Orthopedic Surgery | Admitting: Orthopedic Surgery

## 2023-05-18 ENCOUNTER — Observation Stay (HOSPITAL_COMMUNITY): Payer: PPO

## 2023-05-18 ENCOUNTER — Ambulatory Visit (HOSPITAL_COMMUNITY): Payer: PPO | Admitting: Vascular Surgery

## 2023-05-18 ENCOUNTER — Ambulatory Visit (HOSPITAL_BASED_OUTPATIENT_CLINIC_OR_DEPARTMENT_OTHER): Payer: PPO | Admitting: Anesthesiology

## 2023-05-18 ENCOUNTER — Encounter (HOSPITAL_COMMUNITY): Payer: Self-pay | Admitting: Orthopedic Surgery

## 2023-05-18 ENCOUNTER — Encounter (HOSPITAL_COMMUNITY)
Admission: RE | Disposition: A | Discharge status: Home / Self Care | Payer: Self-pay | Source: Home / Self Care | Attending: Orthopedic Surgery

## 2023-05-18 DIAGNOSIS — Z96611 Presence of right artificial shoulder joint: Secondary | ICD-10-CM | POA: Diagnosis not present

## 2023-05-18 DIAGNOSIS — G4730 Sleep apnea, unspecified: Secondary | ICD-10-CM

## 2023-05-18 DIAGNOSIS — M75102 Unspecified rotator cuff tear or rupture of left shoulder, not specified as traumatic: Secondary | ICD-10-CM

## 2023-05-18 DIAGNOSIS — Z471 Aftercare following joint replacement surgery: Secondary | ICD-10-CM | POA: Diagnosis not present

## 2023-05-18 DIAGNOSIS — Z85828 Personal history of other malignant neoplasm of skin: Secondary | ICD-10-CM | POA: Insufficient documentation

## 2023-05-18 DIAGNOSIS — M7520 Bicipital tendinitis, unspecified shoulder: Secondary | ICD-10-CM | POA: Diagnosis not present

## 2023-05-18 DIAGNOSIS — F17210 Nicotine dependence, cigarettes, uncomplicated: Secondary | ICD-10-CM | POA: Insufficient documentation

## 2023-05-18 DIAGNOSIS — M19212 Secondary osteoarthritis, left shoulder: Secondary | ICD-10-CM | POA: Diagnosis present

## 2023-05-18 DIAGNOSIS — J45909 Unspecified asthma, uncomplicated: Secondary | ICD-10-CM | POA: Diagnosis not present

## 2023-05-18 DIAGNOSIS — Z01818 Encounter for other preprocedural examination: Secondary | ICD-10-CM

## 2023-05-18 DIAGNOSIS — D509 Iron deficiency anemia, unspecified: Secondary | ICD-10-CM | POA: Diagnosis not present

## 2023-05-18 DIAGNOSIS — Z85819 Personal history of malignant neoplasm of unspecified site of lip, oral cavity, and pharynx: Secondary | ICD-10-CM | POA: Diagnosis not present

## 2023-05-18 DIAGNOSIS — M7522 Bicipital tendinitis, left shoulder: Secondary | ICD-10-CM

## 2023-05-18 DIAGNOSIS — Z79899 Other long term (current) drug therapy: Secondary | ICD-10-CM | POA: Diagnosis not present

## 2023-05-18 DIAGNOSIS — J449 Chronic obstructive pulmonary disease, unspecified: Secondary | ICD-10-CM

## 2023-05-18 DIAGNOSIS — Z7982 Long term (current) use of aspirin: Secondary | ICD-10-CM | POA: Diagnosis not present

## 2023-05-18 DIAGNOSIS — M19012 Primary osteoarthritis, left shoulder: Secondary | ICD-10-CM | POA: Diagnosis not present

## 2023-05-18 DIAGNOSIS — G8918 Other acute postprocedural pain: Secondary | ICD-10-CM | POA: Diagnosis not present

## 2023-05-18 DIAGNOSIS — Z96612 Presence of left artificial shoulder joint: Secondary | ICD-10-CM | POA: Diagnosis not present

## 2023-05-18 DIAGNOSIS — M19019 Primary osteoarthritis, unspecified shoulder: Secondary | ICD-10-CM | POA: Diagnosis present

## 2023-05-18 DIAGNOSIS — E782 Mixed hyperlipidemia: Secondary | ICD-10-CM | POA: Diagnosis not present

## 2023-05-18 HISTORY — DX: Malignant neoplasm of mouth, unspecified: C06.9

## 2023-05-18 HISTORY — PX: BICEPT TENODESIS: SHX5116

## 2023-05-18 HISTORY — PX: REVERSE SHOULDER ARTHROPLASTY: SHX5054

## 2023-05-18 SURGERY — ARTHROPLASTY, SHOULDER, TOTAL, REVERSE
Anesthesia: General | Site: Shoulder | Laterality: Left

## 2023-05-18 MED ORDER — OXYCODONE HCL 5 MG PO TABS: 5.0000 mg | ORAL_TABLET | ORAL | 0 refills | Status: DC | PRN

## 2023-05-18 MED ORDER — ROCURONIUM BROMIDE 10 MG/ML (PF) SYRINGE
PREFILLED_SYRINGE | INTRAVENOUS | Status: AC
  Filled 2023-05-18: qty 10

## 2023-05-18 MED ORDER — EPHEDRINE SULFATE-NACL 50-0.9 MG/10ML-% IV SOSY
PREFILLED_SYRINGE | INTRAVENOUS | Status: DC | PRN
  Administered 2023-05-18 (×4): 5 mg via INTRAVENOUS

## 2023-05-18 MED ORDER — LACTATED RINGERS IV SOLN: INTRAVENOUS | Status: DC

## 2023-05-18 MED ORDER — FENTANYL CITRATE (PF) 100 MCG/2ML IJ SOLN: 25.0000 ug | INTRAMUSCULAR | Status: DC | PRN

## 2023-05-18 MED ORDER — PHENYLEPHRINE HCL-NACL 20-0.9 MG/250ML-% IV SOLN
INTRAVENOUS | Status: DC | PRN
  Administered 2023-05-18: 25 ug/min via INTRAVENOUS

## 2023-05-18 MED ORDER — 0.9 % SODIUM CHLORIDE (POUR BTL) OPTIME
TOPICAL | Status: DC | PRN
  Administered 2023-05-18: 1000 mL

## 2023-05-18 MED ORDER — POVIDONE-IODINE 7.5 % EX SOLN
Freq: Once | CUTANEOUS | Status: DC
  Filled 2023-05-18: qty 118

## 2023-05-18 MED ORDER — ROCURONIUM BROMIDE 10 MG/ML (PF) SYRINGE
PREFILLED_SYRINGE | INTRAVENOUS | Status: DC | PRN
  Administered 2023-05-18: 50 mg via INTRAVENOUS

## 2023-05-18 MED ORDER — CEFAZOLIN SODIUM-DEXTROSE 2-4 GM/100ML-% IV SOLN
2.0000 g | Freq: Once | INTRAVENOUS | Status: AC
  Administered 2023-05-18: 2 g via INTRAVENOUS
  Filled 2023-05-18: qty 100

## 2023-05-18 MED ORDER — IRRISEPT - 450ML BOTTLE WITH 0.05% CHG IN STERILE WATER, USP 99.95% OPTIME
TOPICAL | Status: DC | PRN
  Administered 2023-05-18: 450 mL via TOPICAL

## 2023-05-18 MED ORDER — LIDOCAINE 2% (20 MG/ML) 5 ML SYRINGE
INTRAMUSCULAR | Status: DC | PRN
  Administered 2023-05-18: 60 mg via INTRAVENOUS

## 2023-05-18 MED ORDER — TRANEXAMIC ACID-NACL 1000-0.7 MG/100ML-% IV SOLN
1000.0000 mg | INTRAVENOUS | Status: AC
  Administered 2023-05-18: 1000 mg via INTRAVENOUS
  Filled 2023-05-18: qty 100

## 2023-05-18 MED ORDER — EPHEDRINE 5 MG/ML INJ
INTRAVENOUS | Status: AC
  Filled 2023-05-18: qty 5

## 2023-05-18 MED ORDER — LIDOCAINE 2% (20 MG/ML) 5 ML SYRINGE
INTRAMUSCULAR | Status: AC
  Filled 2023-05-18: qty 5

## 2023-05-18 MED ORDER — PROPOFOL 10 MG/ML IV BOLUS
INTRAVENOUS | Status: AC
  Filled 2023-05-18: qty 20

## 2023-05-18 MED ORDER — IPRATROPIUM-ALBUTEROL 0.5-2.5 (3) MG/3ML IN SOLN
RESPIRATORY_TRACT | Status: AC
  Filled 2023-05-18: qty 3

## 2023-05-18 MED ORDER — PROPOFOL 10 MG/ML IV BOLUS
INTRAVENOUS | Status: DC | PRN
  Administered 2023-05-18: 20 mg via INTRAVENOUS
  Administered 2023-05-18 (×2): 30 mg via INTRAVENOUS
  Administered 2023-05-18: 120 mg via INTRAVENOUS

## 2023-05-18 MED ORDER — VANCOMYCIN HCL 1000 MG IV SOLR
INTRAVENOUS | Status: AC
  Filled 2023-05-18: qty 20

## 2023-05-18 MED ORDER — BUPIVACAINE LIPOSOME 1.3 % IJ SUSP
INTRAMUSCULAR | Status: DC | PRN
  Administered 2023-05-18: 10 mL via PERINEURAL

## 2023-05-18 MED ORDER — VANCOMYCIN HCL 1000 MG IV SOLR
INTRAVENOUS | Status: DC | PRN
  Administered 2023-05-18: 1000 mg via TOPICAL

## 2023-05-18 MED ORDER — SUGAMMADEX SODIUM 200 MG/2ML IV SOLN
INTRAVENOUS | Status: DC | PRN
  Administered 2023-05-18: 200 mg via INTRAVENOUS

## 2023-05-18 MED ORDER — POVIDONE-IODINE 10 % EX SWAB
2.0000 | Freq: Once | CUTANEOUS | Status: AC
  Administered 2023-05-18: 2 via TOPICAL

## 2023-05-18 MED ORDER — CEFAZOLIN SODIUM-DEXTROSE 2-4 GM/100ML-% IV SOLN
2.0000 g | INTRAVENOUS | Status: AC
  Administered 2023-05-18: 2 g via INTRAVENOUS
  Filled 2023-05-18: qty 100

## 2023-05-18 MED ORDER — BUPIVACAINE-EPINEPHRINE (PF) 0.5% -1:200000 IJ SOLN
INTRAMUSCULAR | Status: DC | PRN
  Administered 2023-05-18: 15 mL via PERINEURAL

## 2023-05-18 MED ORDER — DEXAMETHASONE SODIUM PHOSPHATE 10 MG/ML IJ SOLN
INTRAMUSCULAR | Status: AC
  Filled 2023-05-18: qty 1

## 2023-05-18 MED ORDER — ONDANSETRON HCL 4 MG/2ML IJ SOLN
INTRAMUSCULAR | Status: AC
  Filled 2023-05-18: qty 2

## 2023-05-18 MED ORDER — DEXAMETHASONE SODIUM PHOSPHATE 10 MG/ML IJ SOLN
INTRAMUSCULAR | Status: DC | PRN
  Administered 2023-05-18: 5 mg via INTRAVENOUS

## 2023-05-18 MED ORDER — FENTANYL CITRATE (PF) 250 MCG/5ML IJ SOLN
INTRAMUSCULAR | Status: DC | PRN
  Administered 2023-05-18: 50 ug via INTRAVENOUS

## 2023-05-18 MED ORDER — ONDANSETRON HCL 4 MG/2ML IJ SOLN
INTRAMUSCULAR | Status: DC | PRN
  Administered 2023-05-18: 4 mg via INTRAVENOUS

## 2023-05-18 MED ORDER — ACETAMINOPHEN 500 MG PO TABS
1000.0000 mg | ORAL_TABLET | Freq: Once | ORAL | Status: AC
  Administered 2023-05-18: 1000 mg via ORAL
  Filled 2023-05-18: qty 2

## 2023-05-18 MED ORDER — IPRATROPIUM-ALBUTEROL 0.5-2.5 (3) MG/3ML IN SOLN
3.0000 mL | Freq: Once | RESPIRATORY_TRACT | Status: AC
  Administered 2023-05-18: 3 mL via RESPIRATORY_TRACT

## 2023-05-18 MED ORDER — MIDAZOLAM HCL 2 MG/2ML IJ SOLN: INTRAMUSCULAR | Status: DC | PRN

## 2023-05-18 MED ORDER — ALBUMIN HUMAN 5 % IV SOLN: INTRAVENOUS | Status: DC | PRN

## 2023-05-18 MED ORDER — CEPHALEXIN 500 MG PO CAPS: ORAL_CAPSULE | ORAL | 0 refills | Status: DC

## 2023-05-18 MED ORDER — CHLORHEXIDINE GLUCONATE 0.12 % MT SOLN
15.0000 mL | Freq: Once | OROMUCOSAL | Status: AC
  Administered 2023-05-18: 15 mL via OROMUCOSAL
  Filled 2023-05-18: qty 15

## 2023-05-18 MED ORDER — FENTANYL CITRATE (PF) 250 MCG/5ML IJ SOLN
INTRAMUSCULAR | Status: AC
  Filled 2023-05-18: qty 5

## 2023-05-18 MED ORDER — ORAL CARE MOUTH RINSE: 15.0000 mL | Freq: Once | OROMUCOSAL | Status: AC

## 2023-05-18 MED ORDER — MIDAZOLAM HCL 2 MG/2ML IJ SOLN
INTRAMUSCULAR | Status: AC
  Filled 2023-05-18: qty 2

## 2023-05-18 SURGICAL SUPPLY — 77 items
ADAPTER TRIAL RVS TAPER 25 SU (ORTHOPEDIC DISPOSABLE SUPPLIES) IMPLANT
ADPTR TRIAL RVS TAPER 25 SU (ORTHOPEDIC DISPOSABLE SUPPLIES) ×1
AID PSTN UNV HD RSTRNT DISP (MISCELLANEOUS) ×1
ALCOHOL 70% 16 OZ (MISCELLANEOUS) ×1 IMPLANT
APL PRP STRL LF DISP 70% ISPRP (MISCELLANEOUS) ×1
AUG COMP REV MI TAPER ADAPTER (Joint) ×1 IMPLANT
AUGMENT COMP REV MI TAPR ADPTR (Joint) IMPLANT
BAG COUNTER SPONGE SURGICOUNT (BAG) ×1 IMPLANT
BAG SPNG CNTER NS LX DISP (BAG) ×1
BEARING HUMERAL SHLDER 36M STD (Shoulder) IMPLANT
BIT DRILL 2.7 W/STOP DISP (BIT) IMPLANT
BIT DRILL QUICK REL 1/8 2PK SL (BIT) IMPLANT
BIT DRILL TWIST 2.7 (BIT) IMPLANT
BLADE SAW SGTL 13X75X1.27 (BLADE) ×1 IMPLANT
BRNG HUM STD 36 RVRS SHLDR (Shoulder) ×1 IMPLANT
BSPLAT GLND SM AUG TPR ADPR (Joint) ×1 IMPLANT
CHLORAPREP W/TINT 26 (MISCELLANEOUS) ×1 IMPLANT
COOLER ICEMAN CLASSIC (MISCELLANEOUS) ×1 IMPLANT
COVER SURGICAL LIGHT HANDLE (MISCELLANEOUS) ×1 IMPLANT
DRAPE INCISE IOBAN 66X45 STRL (DRAPES) ×1 IMPLANT
DRAPE U-SHAPE 47X51 STRL (DRAPES) ×2 IMPLANT
DRSG AQUACEL AG ADV 3.5X10 (GAUZE/BANDAGES/DRESSINGS) ×1 IMPLANT
ELECT BLADE 4.0 EZ CLEAN MEGAD (MISCELLANEOUS) ×1
ELECT REM PT RETURN 9FT ADLT (ELECTROSURGICAL) ×1
ELECTRODE BLDE 4.0 EZ CLN MEGD (MISCELLANEOUS) ×1 IMPLANT
ELECTRODE REM PT RTRN 9FT ADLT (ELECTROSURGICAL) ×1 IMPLANT
GAUZE SPONGE 4X4 12PLY STRL LF (GAUZE/BANDAGES/DRESSINGS) ×1 IMPLANT
GLENOID SPHERE 36+6 (Joint) IMPLANT
GLOVE BIOGEL PI IND STRL 6.5 (GLOVE) ×1 IMPLANT
GLOVE BIOGEL PI IND STRL 8 (GLOVE) ×1 IMPLANT
GLOVE ECLIPSE 6.5 STRL STRAW (GLOVE) ×1 IMPLANT
GLOVE ECLIPSE 8.0 STRL XLNG CF (GLOVE) ×1 IMPLANT
GOWN STRL REUS W/ TWL LRG LVL3 (GOWN DISPOSABLE) ×1 IMPLANT
GOWN STRL REUS W/ TWL XL LVL3 (GOWN DISPOSABLE) ×1 IMPLANT
GOWN STRL REUS W/TWL LRG LVL3 (GOWN DISPOSABLE) ×1
GOWN STRL REUS W/TWL XL LVL3 (GOWN DISPOSABLE) ×2
GUIDE RSA SHLD BM ROT L SU (ORTHOPEDIC DISPOSABLE SUPPLIES) IMPLANT
HYDROGEN PEROXIDE 16OZ (MISCELLANEOUS) ×1 IMPLANT
JET LAVAGE IRRISEPT WOUND (IRRIGATION / IRRIGATOR) ×1
KIT BASIN OR (CUSTOM PROCEDURE TRAY) ×1 IMPLANT
KIT TURNOVER KIT B (KITS) ×1 IMPLANT
LAVAGE JET IRRISEPT WOUND (IRRIGATION / IRRIGATOR) ×1 IMPLANT
MANIFOLD NEPTUNE II (INSTRUMENTS) ×1 IMPLANT
NDL SUT 6 .5 CRC .975X.05 MAYO (NEEDLE) IMPLANT
NEEDLE MAYO TAPER (NEEDLE) ×1
NS IRRIG 1000ML POUR BTL (IV SOLUTION) ×1 IMPLANT
PACK SHOULDER (CUSTOM PROCEDURE TRAY) ×1 IMPLANT
PAD COLD SHLDR WRAP-ON (PAD) ×1 IMPLANT
PIN THREADED REVERSE (PIN) IMPLANT
REAMER GUIDE BUSHING SURG DISP (MISCELLANEOUS) IMPLANT
REAMER GUIDE W/SCREW AUG (MISCELLANEOUS) IMPLANT
RESTRAINT HEAD UNIVERSAL NS (MISCELLANEOUS) ×1 IMPLANT
RETRIEVER SUT HEWSON (MISCELLANEOUS) ×1 IMPLANT
SCREW BONE LOCKING 4.75X35X3.5 (Screw) IMPLANT
SCREW BONE STRL 6.5MMX25MM (Screw) IMPLANT
SCREW LOCKING 4.75MMX15MM (Screw) IMPLANT
SHOULDER HUMERAL BEAR 36M STD (Shoulder) ×1 IMPLANT
SLING ARM IMMOBILIZER LRG (SOFTGOODS) ×1 IMPLANT
SOL PREP POV-IOD 4OZ 10% (MISCELLANEOUS) ×1 IMPLANT
SPONGE T-LAP 18X18 ~~LOC~~+RFID (SPONGE) ×1 IMPLANT
STEM HUMERAL STRL 12MMX83MM (Stem) IMPLANT
STRIP CLOSURE SKIN 1/2X4 (GAUZE/BANDAGES/DRESSINGS) ×1 IMPLANT
SUCTION TUBE FRAZIER 10FR DISP (SUCTIONS) ×1 IMPLANT
SUT BROADBAND TAPE 2PK 1.5 (SUTURE) IMPLANT
SUT MNCRL AB 3-0 PS2 18 (SUTURE) ×1 IMPLANT
SUT SILK 2 0 TIES 10X30 (SUTURE) ×1 IMPLANT
SUT VIC AB 0 CT1 27 (SUTURE) ×5
SUT VIC AB 0 CT1 27XBRD ANBCTR (SUTURE) ×4 IMPLANT
SUT VIC AB 1 CT1 27 (SUTURE) ×2
SUT VIC AB 1 CT1 27XBRD ANBCTR (SUTURE) ×2 IMPLANT
SUT VIC AB 1 CT1 36 (SUTURE) IMPLANT
SUT VIC AB 2-0 CT1 27 (SUTURE) ×3
SUT VIC AB 2-0 CT1 TAPERPNT 27 (SUTURE) ×3 IMPLANT
SUT VICRYL 0 UR6 27IN ABS (SUTURE) ×2 IMPLANT
TOWEL GREEN STERILE (TOWEL DISPOSABLE) ×1 IMPLANT
TRAY HUM REV SHOULDER STD +6 (Shoulder) IMPLANT
WATER STERILE IRR 1000ML POUR (IV SOLUTION) ×1 IMPLANT

## 2023-05-18 NOTE — Anesthesia Postprocedure Evaluation (Signed)
Anesthesia Post Note  Patient: Alicia [name]  Procedure(s) Performed: LEFT REVERSE SHOULDER ARTHROPLASTY (Left: Shoulder) BICEPS TENODESIS (Left: Shoulder)     Patient location during evaluation: PACU Anesthesia Type: General and Regional Level of consciousness: awake and alert Pain management: pain level controlled Vital Signs Assessment: post-procedure vital signs reviewed and stable Respiratory status: spontaneous breathing, nonlabored ventilation, respiratory function stable and patient connected to nasal cannula oxygen Cardiovascular status: blood pressure returned to baseline and stable Postop Assessment: no apparent nausea or vomiting Anesthetic complications: no  No notable events documented.  Last Vitals:  Vitals:   05/18/23 1130 05/18/23 1145  BP: 101/62 104/61  Pulse: 72 71  Resp: 15 14  Temp:    SpO2: 93% 92%    Last Pain:  Vitals:   05/18/23 0604  TempSrc:   PainSc: 5                  Kayle Correa,W. EDMOND

## 2023-05-18 NOTE — Transfer of Care (Signed)
Immediate Anesthesia Transfer of Care Note  Patient: Alicia [name]  Procedure(s) Performed: LEFT REVERSE SHOULDER ARTHROPLASTY (Left: Shoulder) BICEPS TENODESIS (Left: Shoulder)  Patient Location: PACU  Anesthesia Type:GA combined with regional for post-op pain  Level of Consciousness: awake, alert , patient cooperative, and responds to stimulation  Airway & Oxygen Therapy: Patient Spontanous Breathing and Patient connected to face mask oxygen  Post-op Assessment: Report given to RN and Post -op Vital signs reviewed and stable  Post vital signs: Reviewed and stable  Last Vitals:  Vitals Value Taken Time  BP 123/63 05/18/23 1056  Temp    Pulse 78 05/18/23 1057  Resp    SpO2 96 % 05/18/23 1057  Vitals shown include unvalidated device data.  Last Pain:  Vitals:   05/18/23 0604  TempSrc:   PainSc: 5          Complications: No notable events documented.

## 2023-05-18 NOTE — H&P (Signed)
Alicia [name] is an 77 y.o. female.   Chief Complaint: left shoulder pain HPI: Alicia [name] is a 77 year old patient with left shoulder pain.  She has done well with right reverse shoulder replacement performed last year.  She has had some medical issues since that surgery which have now been resolved.  She describes night pain rest pain as well as limitation of motion and strength which affects her activities of daily living because of her left shoulder arthritis.  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, will need biopsy- 05/2023   Neuropathy    bilateral feet/legs   Peripheral vascular disease (HCC)    Mesenteric Artery Stent Placed 2023   Pneumonia    Positive fecal occult blood test 01/19/2022   Sleep apnea    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   R mid forearm, [facility]    Past Surgical History:  Procedure Laterality Date   APPENDECTOMY  10/2016   BREAST BIOPSY Right 2022   CELIAC ARTERY STENT     CHOLECYSTECTOMY  10/2016   COLONOSCOPY     ENTEROSCOPY N/A 12/17/2022   Procedure: ENTEROSCOPY;  Surgeon: Imogene Burn, MD;  Location: [facility];  Service: Gastroenterology;  Laterality: N/A;  small bowel enteroscopy   EYE SURGERY Bilateral 2017   cataracts removed   HOT HEMOSTASIS  12/17/2022   Procedure: HOT HEMOSTASIS (ARGON PLASMA COAGULATION/BICAP);  Surgeon: Imogene Burn, MD;  Location: [facility];  Service: Gastroenterology;;   LUMBAR LAMINECTOMY/DECOMPRESSION MICRODISCECTOMY Left 07/21/2021   Procedure: Laminectomy and Foraminotomy - L4-L5 - left;  Surgeon: Donalee Citrin, MD;  Location: [facility] OR;  Service:  Neurosurgery;  Laterality: Left;  3C   PAROTID GLAND TUMOR EXCISION Left 2003   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   Procedure: PERIPHERAL VASCULAR INTERVENTION;  Surgeon: Maeola Harman, MD;  Location: [facility] INVASIVE CV LAB;  Service: Cardiovascular;;  celiac   TOTAL SHOULDER ARTHROPLASTY Right 08/06/2022   Procedure: RIGHT REVERSE SHOULDER ARTHROPLASTY;  Surgeon: Cammy Copa, MD;  Location: [facility] OR;  Service: Orthopedics;  Laterality: Right;   VISCERAL ANGIOGRAPHY N/A 05/18/2022   Procedure: VISCERAL ANGIOGRAPHY;  Surgeon: Maeola Harman, MD;  Location: [facility] INVASIVE CV LAB;  Service: Cardiovascular;  Laterality: N/A;    Family History  Problem Relation Age of Onset   Heart disease Mother    Cerebral aneurysm Father    Social History:  reports that she has been smoking cigarettes. She has a 62.00 pack-year smoking history. She has been exposed to tobacco smoke. She has never used smokeless tobacco. She reports that she does not currently use alcohol. She reports that she does not use drugs.  Allergies:  Allergies  Allergen Reactions   Amoxicillin Other (See Comments)    Yeast infections    Medications Prior to Admission  Medication Sig Dispense Refill   acetaminophen (TYLENOL) 500 MG tablet Take 1,500 mg by mouth in the morning and at bedtime.     albuterol (VENTOLIN HFA) 108 (90 Base) MCG/ACT inhaler INHALE 1-2 PUFFS INTO THE LUNGS  4 (FOUR) TIMES DAILY AS NEEDED FOR SHORTNESS OF BREATH OR WHEEZING. 8.5 each 0   aspirin EC 81 MG tablet Take 81 mg by mouth every other day. Swallow whole.     BREZTRI AEROSPHERE 160-9-4.8 MCG/ACT AERO INHALE 2 PUFFS INTO THE LUNGS TWICE DAILY. 10.7 each 11   Cholecalciferol (VITAMIN D3) 125 MCG (5000 UT) CAPS Take 5,000 Units by mouth in the morning.     gabapentin (NEURONTIN) 300 MG capsule TAKE 1 CAPSULE BY MOUTH THREE TIMES A DAY (Patient taking differently: Take 300 mg by mouth 3 (three) times daily.) 270 capsule 1    Lutein 20 MG TABS Take 20 mg by mouth in the morning.     Magnesium 400 MG TABS Take 400 mg by mouth at bedtime.     omeprazole (PRILOSEC) 40 MG capsule TAKE 1 CAPSULE (40 MG TOTAL) BY MOUTH DAILY. 90 capsule 1   Polyvinyl Alcohol-Povidone (REFRESH OP) Place 1 drop into both eyes daily as needed (dry eye).     pramipexole (MIRAPEX) 0.5 MG tablet TAKE 1 TABLET BY MOUTH TWICE A DAY (Patient taking differently: Take 0.5 mg by mouth in the morning and at bedtime.) 180 tablet 1   rosuvastatin (CRESTOR) 10 MG tablet TAKE 1 TABLET BY MOUTH EVERY DAY 30 tablet 0   venlafaxine XR (EFFEXOR-XR) 75 MG 24 hr capsule TAKE 1 CAPSULE BY MOUTH DAILY WITH BREAKFAST. (Patient taking differently: Take 75 mg by mouth daily.) 90 capsule 0   faricimab-svoa (VABYSMO) 6 MG/0.05ML SOLN intravitreal injection 6 mg by Intravitreal route every 8 (eight) weeks.     hydrocortisone 2.5 % cream Apply to rash on face and under breasts once to twice daily as directed. (Patient taking differently: Apply 1 Application topically 3 (three) times a week.) 30 g 2    No results found for this or any previous visit (from the past 48 hour(s)). No results found.  Review of Systems  Musculoskeletal:  Positive for arthralgias.  All other systems reviewed and are negative.   Blood pressure 126/61, pulse 76, temperature 97.9 F (36.6 C), temperature source Oral, resp. rate 18, height 5' (1.524 m), weight 90.7 kg, SpO2 95 %. Physical Exam Vitals reviewed.  HENT:     Head: Normocephalic.     Nose: Nose normal.     Mouth/Throat:     Mouth: Mucous membranes are moist.  Cardiovascular:     Rate and Rhythm: Normal rate.  Pulmonary:     Effort: Pulmonary effort is normal.  Abdominal:     General: Abdomen is flat.  Musculoskeletal:     Cervical back: Normal range of motion.  Skin:    General: Skin is warm.     Capillary Refill: Capillary refill takes less than 2 seconds.  Neurological:     General: No focal deficit present.      Mental Status: She is alert.  Psychiatric:        Mood and Affect: Mood normal.   Examination of the left shoulder demonstrates functional deltoid.  Patient does not have forward flexion abduction above 90 degrees.  Subscap strength intact.  Patient does have some weakness to external rotation testing.  Radial pulse intact.  Assessment/Plan Impression is left shoulder arthritis with limitation of motion and loss of function.  Plan is left reverse shoulder replacement and biceps tenodesis.  Risk and benefits are discussed with the patient including not limited to infection nerve and vessel damage instability incomplete pain relief as well as perioperative medical complications associated  with her numerous medical comorbidities.  All questions answered.  Burnard Bunting, MD 05/18/2023, 7:04 AM

## 2023-05-18 NOTE — Anesthesia Procedure Notes (Signed)
Procedure Name: Intubation Date/Time: 05/18/2023 8:01 AM  Performed by: Shary Decamp, CRNAPre-anesthesia Checklist: Patient identified, Patient being monitored, Timeout performed, Emergency Drugs available and Suction available Patient Re-evaluated:Patient Re-evaluated prior to induction Oxygen Delivery Method: Circle System Utilized Preoxygenation: Pre-oxygenation with 100% oxygen Induction Type: IV induction Ventilation: Oral airway inserted - appropriate to patient size and Two handed mask ventilation required Laryngoscope Size: Miller and 2 Grade View: Grade I Tube type: Oral Tube size: 7.0 mm Number of attempts: 1 Airway Equipment and Method: Stylet Placement Confirmation: ETT inserted through vocal cords under direct vision, positive ETCO2 and breath sounds checked- equal and bilateral Secured at: 21 cm Tube secured with: Tape Dental Injury: Teeth and Oropharynx as per pre-operative assessment

## 2023-05-18 NOTE — Anesthesia Procedure Notes (Signed)
Anesthesia Regional Block: Interscalene brachial plexus block   Pre-Anesthetic Checklist: , timeout performed,  Correct Patient, Correct Site, Correct Laterality,  Correct Procedure, Correct Position, site marked,  Risks and benefits discussed,  Pre-op evaluation,  At surgeon's request and post-op pain management  Laterality: Left  Prep: Maximum Sterile Barrier Precautions used, chloraprep       Needles:  Injection technique: Single-shot  Needle Type: Echogenic Stimulator Needle     Needle Length: 5cm  Needle Gauge: 22     Additional Needles:   Procedures:,,,, ultrasound used (permanent image in chart),,    Narrative:  Start time: 05/18/2023 7:26 AM End time: 05/18/2023 7:36 AM Injection made incrementally with aspirations every 5 mL.  Performed by: Personally  Anesthesiologist: Gaynelle Adu, MD

## 2023-05-18 NOTE — Op Note (Signed)
NAME: Alicia [name], Alicia [name] MEDICAL RECORD NO: [id number] ACCOUNT NO: [id number] DATE OF BIRTH: 03-23-1946 FACILITY: [facility] LOCATION: [facility] PHYSICIAN: Graylin Shiver. August Saucer, MD  Operative Report   DATE OF PROCEDURE: 05/18/2023  PREOPERATIVE DIAGNOSIS:  Left shoulder arthritis and biceps tendinitis.  POSTOPERATIVE DIAGNOSIS:  Left shoulder arthritis and biceps tendinitis.  PROCEDURE:  Left shoulder replacement using Biomet reverse shoulder replacement components including small augment glenosphere with 1 central compression screw and four peripheral locking screws and 36+6 glenosphere with size 12 mini humeral stem and +6  offset 40 mm humeral tray with standard 36 mm bearing.  SURGEON:  Graylin Shiver. August Saucer, MD  ASSISTANT:  Karenann Cai, PA.  INDICATIONS:  This is a 77 year old patient with end-stage left shoulder arthritis and biceps tendinitis who presents for operative management after explanation of risks and benefits.  She has done well with her right reverse shoulder replacement.  DESCRIPTION OF PROCEDURE:  The patient was brought to the operating room where general endotracheal anesthesia was induced.  Preoperative antibiotics administered.  Timeout was called.  The patient was placed in the beach chair position with the head in  neutral position.  Left shoulder, arm and hand prescrubbed with hydrogen peroxide first followed by alcohol and Betadine, which was allowed to air dry, then prepped with ChloraPrep solution and draped in sterile manner.  Ioban used to seal the operative  field and cover the axilla and then cover the operative field.  After calling a timeout, deltopectoral approach was made.  Skin and subcutaneous tissue were sharply divided.  IrriSept solution utilized at this time.  Bleeding points encountered  controlled using electrocautery.  Cephalic vein mobilized laterally.  The pectoralis tendon was released about 1.5 cm from its proximal attachment. Biceps tendon was then  tenodesed to the pec tendon using 5-0 Vicryl sutures.  The biceps tendon proximal  end was then taken out of the bicipital groove and the transverse humeral ligament was incised and the rotator interval was opened up to the base of the coracoid.  Next, the circumflex vessels were ligated.  Manual dissection was performed to elevate the  anterior portion of the deltoid off its attachment anteriorly as well as to free up the subacromial space.  Axillary nerve palpated at this time and protected at all times during the case.  Next the subscap was detached from the lesser tuberosity using  a 15 blade.  A completion of the detachment was performed with electrocautery, which included detaching the capsule from the inferior 2 cm of the humeral neck and then taking that around to the 7 o'clock position.  The humeral head was dislocated.   Significant osteophyte formation was present.  Next, the humeral head was cut in approximately 30 degrees of retroversion, which matched the patient's native version.  Broaching was then performed up to a size 12.  In-cap was placed.  Superior rotator  cuff appeared intact.  The posterior retractor was placed.  The labrum was circumferentially excised using electrocautery, taking care to avoid injury to any surrounding neurovascular structures.  Next retractors were placed and the patient-specific  guide was placed.  The glenoid was then reamed in approximately 5 degrees of inferior tilt to a depth of 6 mm in accordance with preoperative templating.  Next, the reaming for the small augment was performed.  At this time, good bony reaming was  achieved and a trial placed, was flushed with the cut bony surfaces.  These surfaces were then irrigated and the true glenoid baseplate was  placed with one central compression screw and four peripheral locking screws.  Next, we trialled with multiple  combinations.  The most stable combination was the +6 glenosphere and a +6 offset humeral tray  with standard bearing.  This gave excellent stability with extension, adduction and superior force along with internal and external rotation.  This was also  "two fingers tight."  Difficult to reduce and difficult to re-dislocate.  The trial stem was removed and 6 suture tapes were placed in lesser tuberosity for subscap repair. Next, we placed IrriSept solution, which had been used throughout the case, but  into the humeral canal.  Next, we placed the +6 glenosphere into the [facility] taper.  Good fixation was achieved there.  Good fit was obtained.  At this time, we placed the stem after removing the IrriSept and placing vancomycin powder in the canal. Stem  fit nicely.  Same stability parameters were maintained with trial reduction and then we put in the true components which was a +6 offset humeral tray and a 36 standard bearing.  This gave excellent stability.  A 3 liters of pouring irrigation was  utilized at this time.  Next, the arm was externally rotated about 30 degrees and subscap was repaired back to the lesser tuberosity using the suture tapes.  IrriSept solution and vancomycin powder was then placed onto the prosthesis.  The rotator  interval was then closed with the arm in external rotation using #1 Vicryl suture.  Axillary nerve again palpated and found to be intact.  The deltopectoral interval was then closed using a #1 Vicryl suture followed by interrupted inverted 0 Vicryl  suture, 2-0 Vicryl suture, and 3-0 Monocryl with Steri-Strips and Aquacel dressing applied along with the shoulder immobilizer.  Luke's assistance was required at all times for retraction, opening, closing, mobilization of tissue.  His assistance was a  medical necessity.   PUS D: 05/18/2023 11:23:11 am T: 05/18/2023 11:50:00 am  JOB: [id number]/ [id number]

## 2023-05-18 NOTE — Brief Op Note (Signed)
    05/18/2023  11:15 AM  PATIENT:  Alicia [name]  77 y.o. female  PRE-OPERATIVE DIAGNOSIS:  left shoulder rotator cuff arthropathy  POST-OPERATIVE DIAGNOSIS:  left shoulder rotator cuff arthropathy  PROCEDURE:  Procedure(s): LEFT REVERSE SHOULDER ARTHROPLASTY BICEPS TENODESIS  SURGEON:  Surgeon(s): Cammy Copa, MD  ASSISTANT: magnant pa  ANESTHESIA:   general  EBL: 200 ml    Total I/O In: 1450 [I.V.:1000; IV Piggyback:450] Out: 200 [Blood:200]  BLOOD ADMINISTERED: none  DRAINS: none   LOCAL MEDICATIONS USED:  none  SPECIMEN:  No Specimen  COUNTS:  YES  TOURNIQUET:  * No tourniquets in log *  DICTATION: .Other Dictation: Dictation Number [id number]  PLAN OF CARE: Discharge to home after PACU  PATIENT DISPOSITION:  PACU - hemodynamically stable

## 2023-05-20 ENCOUNTER — Encounter (HOSPITAL_COMMUNITY): Payer: Self-pay | Admitting: Orthopedic Surgery

## 2023-05-20 ENCOUNTER — Telehealth: Payer: Self-pay | Admitting: *Deleted

## 2023-05-20 DIAGNOSIS — K1379 Other lesions of oral mucosa: Secondary | ICD-10-CM | POA: Diagnosis not present

## 2023-05-20 NOTE — Telephone Encounter (Signed)
Call to patient who is doing extremely well after discharge. States she is having minimal pain and only taking Tylenol as needed. 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 Will be having surgery next Thursday for removal and biopsy of this area. He sent in antibiotics for that procedure. Just FYI.

## 2023-05-21 DIAGNOSIS — I774 Celiac artery compression syndrome: Secondary | ICD-10-CM | POA: Diagnosis not present

## 2023-05-21 DIAGNOSIS — Z85828 Personal history of other malignant neoplasm of skin: Secondary | ICD-10-CM | POA: Diagnosis not present

## 2023-05-21 DIAGNOSIS — Z96611 Presence of right artificial shoulder joint: Secondary | ICD-10-CM | POA: Diagnosis not present

## 2023-05-21 DIAGNOSIS — G629 Polyneuropathy, unspecified: Secondary | ICD-10-CM | POA: Diagnosis not present

## 2023-05-21 DIAGNOSIS — Z96612 Presence of left artificial shoulder joint: Secondary | ICD-10-CM | POA: Diagnosis not present

## 2023-05-21 DIAGNOSIS — Z8701 Personal history of pneumonia (recurrent): Secondary | ICD-10-CM | POA: Diagnosis not present

## 2023-05-21 DIAGNOSIS — G4730 Sleep apnea, unspecified: Secondary | ICD-10-CM | POA: Diagnosis not present

## 2023-05-21 DIAGNOSIS — K449 Diaphragmatic hernia without obstruction or gangrene: Secondary | ICD-10-CM | POA: Diagnosis not present

## 2023-05-21 DIAGNOSIS — R32 Unspecified urinary incontinence: Secondary | ICD-10-CM | POA: Diagnosis not present

## 2023-05-21 DIAGNOSIS — G8929 Other chronic pain: Secondary | ICD-10-CM | POA: Diagnosis not present

## 2023-05-21 DIAGNOSIS — F32A Depression, unspecified: Secondary | ICD-10-CM | POA: Diagnosis not present

## 2023-05-21 DIAGNOSIS — K219 Gastro-esophageal reflux disease without esophagitis: Secondary | ICD-10-CM | POA: Diagnosis not present

## 2023-05-21 DIAGNOSIS — Z471 Aftercare following joint replacement surgery: Secondary | ICD-10-CM | POA: Diagnosis not present

## 2023-05-21 DIAGNOSIS — Z87891 Personal history of nicotine dependence: Secondary | ICD-10-CM | POA: Diagnosis not present

## 2023-05-21 DIAGNOSIS — D509 Iron deficiency anemia, unspecified: Secondary | ICD-10-CM | POA: Diagnosis not present

## 2023-05-21 DIAGNOSIS — I739 Peripheral vascular disease, unspecified: Secondary | ICD-10-CM | POA: Diagnosis not present

## 2023-05-21 DIAGNOSIS — Z85819 Personal history of malignant neoplasm of unspecified site of lip, oral cavity, and pharynx: Secondary | ICD-10-CM | POA: Diagnosis not present

## 2023-05-21 DIAGNOSIS — H3530 Unspecified macular degeneration: Secondary | ICD-10-CM | POA: Diagnosis not present

## 2023-05-21 DIAGNOSIS — Z7982 Long term (current) use of aspirin: Secondary | ICD-10-CM | POA: Diagnosis not present

## 2023-05-21 DIAGNOSIS — J4489 Other specified chronic obstructive pulmonary disease: Secondary | ICD-10-CM | POA: Diagnosis not present

## 2023-05-21 DIAGNOSIS — I6529 Occlusion and stenosis of unspecified carotid artery: Secondary | ICD-10-CM | POA: Diagnosis not present

## 2023-05-21 NOTE — Telephone Encounter (Signed)
Thx I called yesterday and got vm

## 2023-05-21 NOTE — Discharge Summary (Signed)
Physician Discharge Summary      Patient ID: Alicia [name] MRN: [id number] DOB/AGE: [birth year] 77 y.o.  Admit date: 05/18/2023 Discharge date: 05/18/2023  Admission Diagnoses:  Principal Problem:   OA (osteoarthritis) of shoulder   Discharge Diagnoses:  Same  Surgeries: Procedure(s): LEFT REVERSE SHOULDER ARTHROPLASTY BICEPS TENODESIS on 05/18/2023   Consultants:   Discharged Condition: Stable  Hospital Course: Alicia [name] is an 77 y.o. female who was admitted 05/18/2023 with a chief complaint of left shoulder pain, and found to have a diagnosis of left shoulder osteoarthritis.  They were brought to the operating room on 05/18/2023 and underwent the above named procedures.  Pt awoke from anesthesia without complication and was transferred to the recovery room/PACU.  She had no issue recovering from anesthesia in PACU and she opted for discharge home rather than overnight observation.  She had no red flag signs or symptoms and postoperative area when monitored by nursing staff.  She was discharged home on POD 0.  Follow-up with Dr. August Saucer or Franky Macho PA-C 2 weeks postoperatively.  Antibiotics given:  Anti-infectives (From admission, onward)    Start     Dose/Rate Route Frequency Ordered Stop   05/18/23 1400  ceFAZolin (ANCEF) IVPB 2g/100 mL premix        2 g 200 mL/hr over 30 Minutes Intravenous  Once 05/18/23 1116 05/18/23 1428   05/18/23 0739  vancomycin (VANCOCIN) powder  Status:  Discontinued          As needed 05/18/23 0739 05/18/23 1052   05/18/23 0600  ceFAZolin (ANCEF) IVPB 2g/100 mL premix        2 g 200 mL/hr over 30 Minutes Intravenous On call to O.R. 05/18/23 0539 05/18/23 0835   05/18/23 0000  cephALEXin (KEFLEX) 500 MG capsule           05/18/23 1120       .  Recent vital signs:  Vitals:   05/18/23 1330 05/18/23 1345  BP: 123/69 (!) 117/58  Pulse: 72 70  Resp: 15 12  Temp:  98 F (36.7 C)  SpO2: 96% 98%    Recent laboratory studies:  Results for  orders placed or performed during the hospital encounter of 05/06/23  Surgical pcr screen   Specimen: Nasal Mucosa; Nasal Swab  Result Value Ref Range   MRSA, PCR NEGATIVE NEGATIVE   Staphylococcus aureus NEGATIVE NEGATIVE  Urine Culture (for pregnant, neutropenic or urologic patients or patients with an indwelling urinary catheter)   Specimen: Urine, Clean Catch  Result Value Ref Range   Specimen Description URINE, CLEAN CATCH    Special Requests NONE    Culture (A)     <10,000 COLONIES/mL INSIGNIFICANT GROWTH Performed at [facility], [address], [place], [place] [address]    Report Status 05/09/2023 FINAL   Urinalysis, w/ Reflex to Culture (Infection Suspected) -Urine, Clean Catch  Result Value Ref Range   Specimen Source URINE, CLEAN CATCH    Color, Urine STRAW (A) YELLOW   APPearance CLEAR CLEAR   Specific Gravity, Urine 1.006 1.005 - 1.030   pH 6.0 5.0 - 8.0   Glucose, UA NEGATIVE NEGATIVE mg/dL   Hgb urine dipstick NEGATIVE NEGATIVE   Bilirubin Urine NEGATIVE NEGATIVE   Ketones, ur NEGATIVE NEGATIVE mg/dL   Protein, ur NEGATIVE NEGATIVE mg/dL   Nitrite NEGATIVE NEGATIVE   Leukocytes,Ua NEGATIVE NEGATIVE   RBC / HPF 0-5 0 - 5 RBC/hpf   WBC, UA 0-5 0 - 5 WBC/hpf  Bacteria, UA NONE SEEN NONE SEEN   Squamous Epithelial / HPF 0-5 0 - 5 /HPF  CBC  Result Value Ref Range   WBC 8.4 4.0 - 10.5 K/uL   RBC 4.55 3.87 - 5.11 MIL/uL   Hemoglobin 13.0 12.0 - 15.0 g/dL   HCT 62.9 52.8 - 41.3 %   MCV 89.9 80.0 - 100.0 fL   MCH 28.6 26.0 - 34.0 pg   MCHC 31.8 30.0 - 36.0 g/dL   RDW 24.4 (H) 01.0 - 27.2 %   Platelets 199 150 - 400 K/uL   nRBC 0.0 0.0 - 0.2 %  Basic metabolic panel  Result Value Ref Range   Sodium 138 135 - 145 mmol/L   Potassium 4.6 3.5 - 5.1 mmol/L   Chloride 103 98 - 111 mmol/L   CO2 25 22 - 32 mmol/L   Glucose, Bld 93 70 - 99 mg/dL   BUN 15 8 - 23 mg/dL   Creatinine, Ser 5.36 0.44 - 1.00 mg/dL   Calcium 9.5 8.9 - 64.4 mg/dL   GFR, Estimated  >03 >47 mL/min   Anion gap 10 5 - 15    Discharge Medications:   Allergies as of 05/18/2023       Reactions   Amoxicillin Other (See Comments)   Yeast infections        Medication List     TAKE these medications    acetaminophen 500 MG tablet Commonly known as: TYLENOL Take 1,500 mg by mouth in the morning and at bedtime.   albuterol 108 (90 Base) MCG/ACT inhaler Commonly known as: VENTOLIN HFA INHALE 1-2 PUFFS INTO THE LUNGS 4 (FOUR) TIMES DAILY AS NEEDED FOR SHORTNESS OF BREATH OR WHEEZING.   aspirin EC 81 MG tablet Take 81 mg by mouth every other day. Swallow whole.   Breztri Aerosphere 160-9-4.8 MCG/ACT Aero Generic drug: Budeson-Glycopyrrol-Formoterol INHALE 2 PUFFS INTO THE LUNGS TWICE DAILY.   cephALEXin 500 MG capsule Commonly known as: KEFLEX Take 1,000 mg (2 capsules) by mouth with food this evening around dinner-time.  Take last dose (2 capsules) tomorrow morning with breakfast   gabapentin 300 MG capsule Commonly known as: NEURONTIN TAKE 1 CAPSULE BY MOUTH THREE TIMES A DAY What changed: See the new instructions.   hydrocortisone 2.5 % cream Apply to rash on face and under breasts once to twice daily as directed. What changed:  how much to take how to take this when to take this additional instructions   Lutein 20 MG Tabs Take 20 mg by mouth in the morning.   Magnesium 400 MG Tabs Take 400 mg by mouth at bedtime.   omeprazole 40 MG capsule Commonly known as: PRILOSEC TAKE 1 CAPSULE (40 MG TOTAL) BY MOUTH DAILY.   oxyCODONE 5 MG immediate release tablet Commonly known as: Roxicodone Take 1 tablet (5 mg total) by mouth every 4 (four) hours as needed for severe pain.   pramipexole 0.5 MG tablet Commonly known as: MIRAPEX TAKE 1 TABLET BY MOUTH TWICE A DAY What changed: when to take this   REFRESH OP Place 1 drop into both eyes daily as needed (dry eye).   rosuvastatin 10 MG tablet Commonly known as: CRESTOR TAKE 1 TABLET BY MOUTH EVERY  DAY   Vabysmo 6 MG/0.05ML Soln intravitreal injection Generic drug: faricimab-svoa 6 mg by Intravitreal route every 8 (eight) weeks.   venlafaxine XR 75 MG 24 hr capsule Commonly known as: EFFEXOR-XR TAKE 1 CAPSULE BY MOUTH DAILY WITH BREAKFAST. What changed: when to  take this   Vitamin D3 125 MCG (5000 UT) Caps Take 5,000 Units by mouth in the morning.        Diagnostic Studies: DG Shoulder Left Port  Result Date: 05/18/2023 CLINICAL DATA:  Status post reverse total shoulder arthroplasty. EXAM: LEFT SHOULDER COMPARISON:  Preoperative imaging FINDINGS: Reverse left shoulder arthroplasty in expected alignment. No periprosthetic lucency or fracture. Recent postsurgical change includes air and edema in the soft tissues. IMPRESSION: Reverse left shoulder arthroplasty without immediate postoperative complication. Electronically Signed   By: Narda Rutherford M.D.   On: 05/18/2023 13:02   XR C-ARM NO REPORT  Result Date: 04/27/2023 Please see Notes tab for imaging impression.  Epidural Steroid injection  Result Date: 04/27/2023 Tyrell Antonio, MD     05/05/2023  6:47 AM Lumbar Epidural Steroid Injection - Interlaminar Approach with Fluoroscopic Guidance Patient: Alicia [name]     Date of Birth: [birth year] MRN: [id number] PCP: Mort Sawyers, FNP     Visit Date: 04/27/2023  Universal Protocol:   Consent Given By: the patient Position: PRONE Additional Comments: Vital signs were monitored before and after the procedure. Patient was prepped and draped in the usual sterile fashion. The correct patient, procedure, and site was verified. Injection Procedure Details: Procedure diagnoses: Lumbar radiculopathy [M54.16] Meds Administered: Meds ordered this encounter Medications  methylPREDNISolone acetate (DEPO-MEDROL) injection 80 mg  Laterality: Midline Location/Site:  L3-4 Needle: 4.5 in., 20 ga. Tuohy Needle Placement: Paramedian epidural Findings:  -Comments: Excellent flow of contrast into the  epidural space. Procedure Details: Using a paramedian approach from the side mentioned above, the region overlying the inferior lamina was localized under fluoroscopic visualization and the soft tissues overlying this structure were infiltrated with 4 ml. of 1% Lidocaine without Epinephrine. The Tuohy needle was inserted into the epidural space using a paramedian approach. The epidural space was localized using loss of resistance along with counter oblique bi-planar fluoroscopic views.  After negative aspirate for air, blood, and CSF, a 2 ml. volume of Isovue-250 was injected into the epidural space and the flow of contrast was observed. Radiographs were obtained for documentation purposes.  The injectate was administered into the level noted above. Additional Comments: The patient tolerated the procedure well Dressing: 2 x 2 sterile gauze and Band-Aid  Post-procedure details: Patient was observed during the procedure. Post-procedure instructions were reviewed. Patient left the clinic in stable condition.  CT SHOULDER LEFT WO CONTRAST  Result Date: 04/26/2023 CLINICAL DATA:  Left shoulder pain, preop planning.  Osteoarthritis. EXAM: CT OF THE UPPER LEFT EXTREMITY WITHOUT CONTRAST TECHNIQUE: Multidetector CT imaging of the upper left extremity was performed according to the standard protocol. RADIATION DOSE REDUCTION: This exam was performed according to the departmental dose-optimization program which includes automated exposure control, adjustment of the mA and/or kV according to patient size and/or use of iterative reconstruction technique. COMPARISON:  Radiographs dated March 02, 2023 FINDINGS: Bones/Joint/Cartilage Near complete loss of the articular cartilage with bone-on-bone articulation and subchondral cystic changes. Bulky marginal osteophytes. No appreciable joint effusion. Moderate acromioclavicular osteoarthritis. Ligaments Suboptimally assessed by CT. Muscles and Tendons Rotator cuff tendons appear  intact. No evidence of tendon tear. No muscle atrophy. Soft tissues Skin and subcutaneous soft tissues are within normal limits. IMPRESSION: 1. Severe osteoarthritis of the left glenohumeral joint with bone-on-bone articulation and subchondral cystic changes. 2. Moderate acromioclavicular osteoarthritis. 3. No evidence of rotator cuff tendon tear. Electronically Signed   By: Larose Hires D.O.   On: 04/26/2023 10:27    Disposition:  Discharge disposition: 01-Home or Self Care       Discharge Instructions     Call MD / Call 911   Complete by: As directed    If you experience chest pain or shortness of breath, CALL 911 and be transported to the hospital emergency room.  If you develope a fever above 101 F, pus (white drainage) or increased drainage or redness at the wound, or calf pain, call your surgeon's office.   Constipation Prevention   Complete by: As directed    Drink plenty of fluids.  Prune juice may be helpful.  You may use a stool softener, such as Colace (over the counter) 100 mg twice a day.  Use MiraLax (over the counter) for constipation as needed.   Diet - low sodium heart healthy   Complete by: As directed    Discharge instructions   Complete by: As directed    You may shower, dressing is waterproof.  Do not bathe or soak the operative shoulder in a tub, pool.  Use the CPM machine 3 times a day for one hour each time, increase the degrees of range of motion with each session.  No lifting with the operative shoulder. Continue use of the sling.  Follow-up with Dr. August Saucer or Drema Pry in ~2 weeks on your given appointment date.  We will remove your adhesive bandage at that time.    Dental Antibiotics:  In most cases prophylactic antibiotics for Dental procdeures after total joint surgery are not necessary.  Exceptions are as follows:  1. History of prior total joint infection  2. Severely immunocompromised (Organ Transplant, cancer chemotherapy, Rheumatoid biologic meds such  as Humera)  3. Poorly controlled diabetes (A1C &gt; 8.0, blood glucose over 200)  If you have one of these conditions, contact your surgeon for an antibiotic prescription, prior to your dental procedure.   Increase activity slowly as tolerated   Complete by: As directed    Post-operative opioid taper instructions:   Complete by: As directed    POST-OPERATIVE OPIOID TAPER INSTRUCTIONS: It is important to wean off of your opioid medication as soon as possible. If you do not need pain medication after your surgery it is ok to stop day one. Opioids include: Codeine, Hydrocodone(Norco, Vicodin), Oxycodone(Percocet, oxycontin) and hydromorphone amongst others.  Long term and even short term use of opiods can cause: Increased pain response Dependence Constipation Depression Respiratory depression And more.  Withdrawal symptoms can include Flu like symptoms Nausea, vomiting And more Techniques to manage these symptoms Hydrate well Eat regular healthy meals Stay active Use relaxation techniques(deep breathing, meditating, yoga) Do Not substitute Alcohol to help with tapering If you have been on opioids for less than two weeks and do not have pain than it is ok to stop all together.  Plan to wean off of opioids This plan should start within one week post op of your joint replacement. Maintain the same interval or time between taking each dose and first decrease the dose.  Cut the total daily intake of opioids by one tablet each day Next start to increase the time between doses. The last dose that should be eliminated is the evening dose.             SignedKarenann Cai 05/21/2023, 11:31 AM

## 2023-05-24 ENCOUNTER — Telehealth: Payer: Self-pay | Admitting: Surgical

## 2023-05-24 NOTE — Telephone Encounter (Signed)
 IC verbal given.  

## 2023-05-24 NOTE — Telephone Encounter (Signed)
Received call from Carmelina Paddock (PT) with [facility] needing verbal orders for HHPT 1 Wk 1, 3 Wk 3 and twice a week for 1 Wk. The number to contact Byrd Hesselbach is [phone]  Byrd Hesselbach advised phone is secure

## 2023-05-25 ENCOUNTER — Telehealth: Payer: Self-pay | Admitting: Family

## 2023-05-25 ENCOUNTER — Ambulatory Visit: Payer: PPO | Admitting: Physical Medicine and Rehabilitation

## 2023-05-25 NOTE — Telephone Encounter (Signed)
 LMTCB

## 2023-05-25 NOTE — Telephone Encounter (Signed)
Patient called to share the following information:  Had an abnormal urinalysis late June. Was told to follow up with her PCP.  Surgery under the tongue on Thursday 7.18.24, taking four amoxicillan prior to surgery previously had been seen here for ear pain, patient states that she read pain in ear can result in cancer  Patient is willing to speak with provider or representative over the phone, not in person,  not well enough to sit up at the computer

## 2023-05-26 ENCOUNTER — Telehealth: Payer: Self-pay | Admitting: *Deleted

## 2023-05-26 NOTE — Telephone Encounter (Signed)
 LMTCB

## 2023-05-26 NOTE — Telephone Encounter (Signed)
 Ortho bundle 7 day call completed. 

## 2023-05-26 NOTE — Telephone Encounter (Signed)
Patient called in returning call she received. 

## 2023-05-27 DIAGNOSIS — K1329 Other disturbances of oral epithelium, including tongue: Secondary | ICD-10-CM | POA: Diagnosis not present

## 2023-05-28 NOTE — Telephone Encounter (Signed)
I am not sure of this, have not heard this however does she see ENT?  Who did the tongue biopsy?

## 2023-05-28 NOTE — Telephone Encounter (Signed)
Spoke with Ms. [place].  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  Dr. Leonie Man did the biopsy yesterday.  He is a Transport planner.  She will request they send records to [place].

## 2023-05-28 NOTE — Telephone Encounter (Signed)
FYI - pt wanted [place] to know that she has done some research and ear pain can be a symptom of tongue cancer.  Pt had a biopsy yesterday on a suspicious spot under her tongue.    On pt's initial call she was told that she needed to follow up with PCP concerning an abnormal urinalysis done in late June.  Pt denies any pain, burning, cloudiness or odor of her urine.  She reported feeling the best that she has in a while.  Encouraged her to call back if she begins to experience any UTI symptoms.  Pt verbalized agreement with plan and understanding.

## 2023-05-29 ENCOUNTER — Other Ambulatory Visit: Payer: Self-pay | Admitting: Vascular Surgery

## 2023-05-31 NOTE — Telephone Encounter (Signed)
Pt will need to await tongue biopsy report to even see if this is the case or not. Hopefully benign.

## 2023-06-01 DIAGNOSIS — H35423 Microcystoid degeneration of retina, bilateral: Secondary | ICD-10-CM | POA: Diagnosis not present

## 2023-06-01 DIAGNOSIS — H353114 Nonexudative age-related macular degeneration, right eye, advanced atrophic with subfoveal involvement: Secondary | ICD-10-CM | POA: Diagnosis not present

## 2023-06-01 DIAGNOSIS — H353123 Nonexudative age-related macular degeneration, left eye, advanced atrophic without subfoveal involvement: Secondary | ICD-10-CM | POA: Diagnosis not present

## 2023-06-01 DIAGNOSIS — H353232 Exudative age-related macular degeneration, bilateral, with inactive choroidal neovascularization: Secondary | ICD-10-CM | POA: Diagnosis not present

## 2023-06-01 DIAGNOSIS — H43813 Vitreous degeneration, bilateral: Secondary | ICD-10-CM | POA: Diagnosis not present

## 2023-06-02 ENCOUNTER — Ambulatory Visit (INDEPENDENT_AMBULATORY_CARE_PROVIDER_SITE_OTHER): Payer: PPO | Admitting: Surgical

## 2023-06-02 ENCOUNTER — Other Ambulatory Visit: Payer: Self-pay | Admitting: Vascular Surgery

## 2023-06-02 ENCOUNTER — Other Ambulatory Visit (INDEPENDENT_AMBULATORY_CARE_PROVIDER_SITE_OTHER): Payer: PPO

## 2023-06-02 DIAGNOSIS — M19012 Primary osteoarthritis, left shoulder: Secondary | ICD-10-CM | POA: Diagnosis not present

## 2023-06-04 ENCOUNTER — Encounter: Payer: Self-pay | Admitting: Surgical

## 2023-06-04 NOTE — Progress Notes (Signed)
Post-Op Visit Note   Patient: Alicia [name]           Date of Birth: February 09, 1946           MRN: [id number] Visit Date: 06/02/2023 PCP: Mort Sawyers, FNP   Assessment & Plan:  Chief Complaint:  Chief Complaint  Patient presents with   Left Shoulder - Routine Post Op   Visit Diagnoses:  1. Primary osteoarthritis, left shoulder     Plan: Alicia [name] is a 77 y.o. female who presents s/p left reverse shoulder arthroplasty on 05/18/2023.  Patient is doing well and pain is overall controlled.  Not using CPM machine and just doing home exercises.  Denies any chest pain, SOB, fevers, chills.  No complaint of any instability symptoms.  Not taking any pain medication.  On exam, patient has range of motion 15 degrees X rotation, 90 degrees abduction, 135 degrees forward elevation passively.  Intact EPL, FPL, finger abduction, finger adduction, pronation/supination, bicep, tricep, deltoid of operative extremity.  Axillary nerve intact with deltoid firing.  Incision is healing well without evidence of infection or dehiscence.  Incision was made sure to be covered with Steri-Strips from the proximal to distal aspect of the length of the incision.  2+ radial pulse of the operative extremity  Plan is discontinue sling.  Okay to very lightly lift with the operative extremity but no lifting anything heavier than a coffee cup or cell phone.  Start physical therapy to focus on passive range of motion and active range of motion with deltoid isometrics.  Do not want to externally rotate past 30 degrees to protect subscapularis repair.  Follow-up in 4 weeks for clinical recheck.    Patient also reports significant hip discomfort and is ambulating with Trendelenburg gait.  She describes right hip groin pain as well as buttock pain.  She has had prior hip injections with the most recent injection in early May that gave her about 1 week of good relief and then injection wore off.  She does have recent  pelvis MRI from May 2024 demonstrating some mild hip arthritis with chronic atrophy of gluteal musculature bilaterally.  The combination of these pathologies is likely contributing to her Trendelenburg gait.  Hold off on any intervention for now but patient would like to discuss options further at her next appointment in 4 weeks.  Follow-Up Instructions: No follow-ups on file.   Orders:  Orders Placed This Encounter  Procedures   XR Shoulder Left   No orders of the defined types were placed in this encounter.   Imaging: No results found.  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 06/16/2022   Seborrheic dermatitis 06/16/2022   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/2022   Centrilobular emphysema (HCC) 02/13/2022   Cigarette smoker 02/06/2022   Asthmatic bronchitis , chronic 02/05/2022   Incidental pulmonary nodule, greater than or equal to 8mm 01/21/2022   Bilateral carotid artery stenosis 12/26/2021   Iron deficiency anemia 12/26/2021   Mixed hyperlipidemia 12/26/2021   Chronic cough 12/26/2021   Adjustment reaction with anxiety and depression 12/26/2021   Chronic venous insufficiency 02/29/2020  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/05/2019   Arthritis of shoulder 06/08/2018   GERD (gastroesophageal reflux disease) 06/08/2018   Macular degeneration 06/08/2018   Sleep apnea 06/08/2018   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   Depression    GERD  (gastroesophageal reflux disease)    History of hiatal hernia    Macular degeneration    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, will need biopsy- 05/2023   Neuropathy    bilateral feet/legs   Peripheral vascular disease (HCC)    Mesenteric Artery Stent Placed 2023   Pneumonia    Positive fecal occult blood test 01/19/2022   Sleep apnea    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   R mid forearm, [facility]    Family History  Problem Relation Age of Onset   Heart disease Mother    Cerebral aneurysm Father     Past Surgical History:  Procedure Laterality Date   APPENDECTOMY  10/2016   BICEPT TENODESIS Left 05/18/2023   Procedure: BICEPS TENODESIS;  Surgeon: Cammy Copa, MD;  Location: [facility] OR;  Service: Orthopedics;  Laterality: Left;   BREAST BIOPSY Right 2022   CELIAC ARTERY STENT     CHOLECYSTECTOMY  10/2016   COLONOSCOPY     ENTEROSCOPY N/A 12/17/2022   Procedure: ENTEROSCOPY;  Surgeon: Imogene Burn, MD;  Location: [facility];  Service: Gastroenterology;  Laterality: N/A;  small bowel enteroscopy   EYE SURGERY Bilateral 2017   cataracts removed   HOT HEMOSTASIS  12/17/2022   Procedure: HOT HEMOSTASIS (ARGON PLASMA COAGULATION/BICAP);  Surgeon: Imogene Burn, MD;  Location: [facility];  Service: Gastroenterology;;   LUMBAR LAMINECTOMY/DECOMPRESSION MICRODISCECTOMY Left 07/21/2021   Procedure: Laminectomy and Foraminotomy - L4-L5 - left;  Surgeon: Donalee Citrin, MD;  Location: [facility] OR;  Service: Neurosurgery;  Laterality: Left;  3C   PAROTID GLAND TUMOR EXCISION Left 2003   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   Procedure: PERIPHERAL VASCULAR INTERVENTION;  Surgeon: Maeola Harman, MD;  Location: [facility] INVASIVE CV LAB;  Service: Cardiovascular;;  celiac   REVERSE SHOULDER ARTHROPLASTY Left 05/18/2023   Procedure: LEFT REVERSE SHOULDER ARTHROPLASTY;  Surgeon: Cammy Copa, MD;  Location: [facility] OR;  Service: Orthopedics;  Laterality: Left;   TOTAL  SHOULDER ARTHROPLASTY Right 08/06/2022   Procedure: RIGHT REVERSE SHOULDER ARTHROPLASTY;  Surgeon: Cammy Copa, MD;  Location: [facility] OR;  Service: Orthopedics;  Laterality: Right;   VISCERAL ANGIOGRAPHY N/A 05/18/2022   Procedure: VISCERAL ANGIOGRAPHY;  Surgeon: Maeola Harman, MD;  Location: [facility] INVASIVE CV LAB;  Service: Cardiovascular;  Laterality: N/A;   Social History   Occupational History   Not on file  Tobacco Use   Smoking status: Every Day    Current packs/day: 1.00    Average packs/day: 1 pack/day for 62.0 years (62.0 ttl pk-yrs)    Types: Cigarettes    Passive exposure: Current   Smokeless tobacco: Never   Tobacco comments:    Smokes 1- 1.5 PPD 01/29/2023  Vaping Use   Vaping status: Never Used  Substance and Sexual Activity   Alcohol use: Not Currently   Drug use: Never   Sexual activity: Not Currently

## 2023-06-05 DIAGNOSIS — M7522 Bicipital tendinitis, left shoulder: Secondary | ICD-10-CM

## 2023-06-07 ENCOUNTER — Other Ambulatory Visit: Payer: Self-pay | Admitting: Family

## 2023-06-09 ENCOUNTER — Other Ambulatory Visit: Payer: Self-pay | Admitting: Family

## 2023-06-09 DIAGNOSIS — F4323 Adjustment disorder with mixed anxiety and depressed mood: Secondary | ICD-10-CM

## 2023-06-17 ENCOUNTER — Telehealth: Payer: Self-pay | Admitting: Internal Medicine

## 2023-06-17 NOTE — Telephone Encounter (Signed)
Pt needs a new sleep machine, it is saying service required. Doesn't know if she needs a new sleep study or not.

## 2023-06-18 NOTE — Telephone Encounter (Signed)
Pt. Calling back again to see if Dr. Sherene Sires can place script for new Cpap please advise

## 2023-06-18 NOTE — Telephone Encounter (Signed)
Fine to order - I don't do sleep medicine though so see who ordered it and get her re-eval there w/in 3 m

## 2023-06-18 NOTE — Telephone Encounter (Signed)
??   We will need an order

## 2023-06-21 DIAGNOSIS — H353231 Exudative age-related macular degeneration, bilateral, with active choroidal neovascularization: Secondary | ICD-10-CM | POA: Diagnosis not present

## 2023-06-21 DIAGNOSIS — Z961 Presence of intraocular lens: Secondary | ICD-10-CM | POA: Diagnosis not present

## 2023-06-21 DIAGNOSIS — H04123 Dry eye syndrome of bilateral lacrimal glands: Secondary | ICD-10-CM | POA: Diagnosis not present

## 2023-06-21 NOTE — Telephone Encounter (Signed)
Left message for patient to call back to clarify her CPAP needs.

## 2023-06-22 DIAGNOSIS — H353114 Nonexudative age-related macular degeneration, right eye, advanced atrophic with subfoveal involvement: Secondary | ICD-10-CM | POA: Diagnosis not present

## 2023-06-22 DIAGNOSIS — H353123 Nonexudative age-related macular degeneration, left eye, advanced atrophic without subfoveal involvement: Secondary | ICD-10-CM | POA: Diagnosis not present

## 2023-06-25 ENCOUNTER — Encounter (HOSPITAL_BASED_OUTPATIENT_CLINIC_OR_DEPARTMENT_OTHER): Payer: Self-pay

## 2023-06-25 DIAGNOSIS — G4733 Obstructive sleep apnea (adult) (pediatric): Secondary | ICD-10-CM | POA: Diagnosis not present

## 2023-06-25 NOTE — Telephone Encounter (Signed)
Mychart message sent to patient to find out who ordered the CPAP.

## 2023-06-30 ENCOUNTER — Encounter (HOSPITAL_COMMUNITY): Payer: PPO

## 2023-06-30 ENCOUNTER — Encounter: Payer: Self-pay | Admitting: Orthopedic Surgery

## 2023-06-30 ENCOUNTER — Ambulatory Visit (INDEPENDENT_AMBULATORY_CARE_PROVIDER_SITE_OTHER): Payer: PPO | Admitting: Orthopedic Surgery

## 2023-06-30 ENCOUNTER — Ambulatory Visit: Payer: PPO

## 2023-06-30 DIAGNOSIS — M5442 Lumbago with sciatica, left side: Secondary | ICD-10-CM

## 2023-06-30 DIAGNOSIS — M5416 Radiculopathy, lumbar region: Secondary | ICD-10-CM

## 2023-06-30 DIAGNOSIS — G8929 Other chronic pain: Secondary | ICD-10-CM

## 2023-06-30 DIAGNOSIS — M5441 Lumbago with sciatica, right side: Secondary | ICD-10-CM

## 2023-06-30 NOTE — Progress Notes (Signed)
Post-Op Visit Note   Patient: Alicia [name]           Date of Birth: [birth year]           MRN: [id number] Visit Date: 06/30/2023 PCP: Mort Sawyers, FNP   Assessment & Plan:  Chief Complaint:  Chief Complaint  Patient presents with   Left Shoulder - Routine Post Op    05/18/23 left RSA   Lower Back - Pain   Visit Diagnoses:  1. Lumbar radiculopathy   2. Chronic bilateral low back pain with bilateral sciatica     Plan: Sadye is now about 6 weeks out left reverse shoulder replacement.  She is doing well with her shoulder.  She does report severe pain and difficulty in her back and hip region.  She did have an MRI of the pelvis and lumbar spine earlier this year.  Has had prior surgery by Dr. Wynetta Emery last year.  Does have severe foraminal stenosis at multiple levels on both sides.  Predominately having right sided pain currently.  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  Importantly she had an epidural steroid injection before surgery which helped her significantly but only for 2 days.  She has only about 50 steps of walking endurance.  On examination of the shoulder she does have excellent overhead motion and reasonable strength.  Incision intact.  Plan at this time is to release her from left shoulder surgery.  Continue with home exercise program.  She has been getting physical therapy at home.  Her bigger problem now is the back.  She has had injections.  I do not think this is related to her rami fracture nonunion or necessarily the sacrum.  Follow-up with Dr. Wynetta Emery for further evaluation and management of her back pain and follow-up with [place] as needed.  Follow-Up Instructions: No follow-ups on file.   Orders:  Orders Placed This Encounter  Procedures   Ambulatory referral to Neurosurgery   No orders of the defined types were placed in this encounter.   Imaging: No results found.  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 06/16/2022   Seborrheic dermatitis 06/16/2022   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/2022   Centrilobular emphysema (HCC) 02/13/2022   Cigarette smoker 02/06/2022   Asthmatic bronchitis , chronic 02/05/2022   Incidental pulmonary nodule, greater than or equal to 8mm 01/21/2022   Bilateral carotid artery stenosis 12/26/2021   Iron deficiency anemia 12/26/2021   Mixed hyperlipidemia 12/26/2021   Chronic cough 12/26/2021   Adjustment reaction with anxiety and depression 12/26/2021   Chronic venous insufficiency 02/29/2020   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/05/2019   Arthritis of shoulder 06/08/2018   GERD (gastroesophageal reflux disease) 06/08/2018   Macular degeneration 06/08/2018   Sleep apnea 06/08/2018   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  Depression    GERD (gastroesophageal reflux disease)    History of hiatal hernia    Macular degeneration    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, will need biopsy- 05/2023   Neuropathy    bilateral feet/legs   Peripheral vascular disease (HCC)    Mesenteric Artery Stent Placed 2023   Pneumonia    Positive fecal occult blood test 01/19/2022   Sleep apnea    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   R mid forearm, [facility]    Family History  Problem Relation Age of Onset   Heart disease Mother    Cerebral aneurysm Father     Past Surgical  History:  Procedure Laterality Date   APPENDECTOMY  10/2016   BICEPT TENODESIS Left 05/18/2023   Procedure: BICEPS TENODESIS;  Surgeon: Cammy Copa, MD;  Location: [facility] OR;  Service: Orthopedics;  Laterality: Left;   BREAST BIOPSY Right 2022   CELIAC ARTERY STENT     CHOLECYSTECTOMY  10/2016   COLONOSCOPY     ENTEROSCOPY N/A 12/17/2022   Procedure: ENTEROSCOPY;  Surgeon: Imogene Burn, MD;  Location: [facility];  Service: Gastroenterology;  Laterality: N/A;  small bowel enteroscopy   EYE SURGERY Bilateral 2017   cataracts removed   HOT HEMOSTASIS  12/17/2022   Procedure: HOT HEMOSTASIS (ARGON PLASMA COAGULATION/BICAP);  Surgeon: Imogene Burn, MD;  Location: [facility];  Service: Gastroenterology;;   LUMBAR LAMINECTOMY/DECOMPRESSION MICRODISCECTOMY Left 07/21/2021   Procedure: Laminectomy and Foraminotomy - L4-L5 - left;  Surgeon: Donalee Citrin, MD;  Location: [facility] OR;  Service: Neurosurgery;  Laterality: Left;  3C   PAROTID GLAND TUMOR EXCISION Left 2003   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   Procedure: PERIPHERAL VASCULAR INTERVENTION;  Surgeon: Maeola Harman, MD;  Location: [facility] INVASIVE CV LAB;  Service: Cardiovascular;;  celiac   REVERSE SHOULDER ARTHROPLASTY Left 05/18/2023   Procedure: LEFT REVERSE SHOULDER ARTHROPLASTY;  Surgeon: Cammy Copa, MD;  Location: [facility] OR;  Service: Orthopedics;  Laterality: Left;   TOTAL SHOULDER ARTHROPLASTY Right 08/06/2022   Procedure: RIGHT REVERSE SHOULDER ARTHROPLASTY;  Surgeon: Cammy Copa, MD;  Location: [facility] OR;  Service: Orthopedics;  Laterality: Right;   VISCERAL ANGIOGRAPHY N/A 05/18/2022   Procedure: VISCERAL ANGIOGRAPHY;  Surgeon: Maeola Harman, MD;  Location: [facility] INVASIVE CV LAB;  Service: Cardiovascular;  Laterality: N/A;   Social History   Occupational History   Not on file  Tobacco Use   Smoking status: Every Day    Current packs/day: 1.00    Average packs/day: 1 pack/day for 62.0  years (62.0 ttl pk-yrs)    Types: Cigarettes    Passive exposure: Current   Smokeless tobacco: Never   Tobacco comments:    Smokes 1- 1.5 PPD 01/29/2023  Vaping Use   Vaping status: Never Used  Substance and Sexual Activity   Alcohol use: Not Currently   Drug use: Never   Sexual activity: Not Currently

## 2023-07-01 ENCOUNTER — Encounter: Payer: Self-pay | Admitting: Oncology

## 2023-07-05 ENCOUNTER — Other Ambulatory Visit: Payer: Self-pay | Admitting: Family

## 2023-07-21 ENCOUNTER — Ambulatory Visit: Payer: PPO

## 2023-07-21 ENCOUNTER — Ambulatory Visit (HOSPITAL_COMMUNITY)
Admission: RE | Admit: 2023-07-21 | Discharge: 2023-07-21 | Disposition: A | Discharge status: Home / Self Care | Payer: PPO | Source: Ambulatory Visit | Attending: Vascular Surgery | Admitting: Vascular Surgery

## 2023-07-21 DIAGNOSIS — K551 Chronic vascular disorders of intestine: Secondary | ICD-10-CM | POA: Diagnosis not present

## 2023-07-27 ENCOUNTER — Other Ambulatory Visit: Payer: Self-pay

## 2023-07-27 DIAGNOSIS — D509 Iron deficiency anemia, unspecified: Secondary | ICD-10-CM

## 2023-07-28 ENCOUNTER — Inpatient Hospital Stay: Payer: PPO | Attending: Oncology

## 2023-07-28 DIAGNOSIS — F17210 Nicotine dependence, cigarettes, uncomplicated: Secondary | ICD-10-CM | POA: Diagnosis not present

## 2023-07-28 DIAGNOSIS — Z85828 Personal history of other malignant neoplasm of skin: Secondary | ICD-10-CM | POA: Diagnosis not present

## 2023-07-28 DIAGNOSIS — M549 Dorsalgia, unspecified: Secondary | ICD-10-CM | POA: Insufficient documentation

## 2023-07-28 DIAGNOSIS — D509 Iron deficiency anemia, unspecified: Secondary | ICD-10-CM | POA: Diagnosis not present

## 2023-07-28 LAB — CBC WITH DIFFERENTIAL (CANCER CENTER ONLY)
Abs Immature Granulocytes: 0.04 10*3/uL (ref 0.00–0.07)
Basophils Absolute: 0.1 10*3/uL (ref 0.0–0.1)
Basophils Relative: 1 %
Eosinophils Absolute: 0.3 10*3/uL (ref 0.0–0.5)
Eosinophils Relative: 3 %
HCT: 39.7 % (ref 36.0–46.0)
Hemoglobin: 12.2 g/dL (ref 12.0–15.0)
Immature Granulocytes: 0 %
Lymphocytes Relative: 18 %
Lymphs Abs: 1.7 10*3/uL (ref 0.7–4.0)
MCH: 28.2 pg (ref 26.0–34.0)
MCHC: 30.7 g/dL (ref 30.0–36.0)
MCV: 91.9 fL (ref 80.0–100.0)
Monocytes Absolute: 0.7 10*3/uL (ref 0.1–1.0)
Monocytes Relative: 7 %
Neutro Abs: 6.6 10*3/uL (ref 1.7–7.7)
Neutrophils Relative %: 71 %
Platelet Count: 197 10*3/uL (ref 150–400)
RBC: 4.32 MIL/uL (ref 3.87–5.11)
RDW: 14.6 % (ref 11.5–15.5)
WBC Count: 9.3 10*3/uL (ref 4.0–10.5)
nRBC: 0 % (ref 0.0–0.2)

## 2023-07-28 LAB — FERRITIN: Ferritin: 9 ng/mL — ABNORMAL LOW (ref 11–307)

## 2023-07-28 LAB — IRON AND TIBC
Iron: 33 ug/dL (ref 28–170)
Saturation Ratios: 8 % — ABNORMAL LOW (ref 10.4–31.8)
TIBC: 428 ug/dL (ref 250–450)
UIBC: 395 ug/dL

## 2023-07-29 ENCOUNTER — Inpatient Hospital Stay (HOSPITAL_BASED_OUTPATIENT_CLINIC_OR_DEPARTMENT_OTHER): Payer: PPO | Admitting: Oncology

## 2023-07-29 ENCOUNTER — Inpatient Hospital Stay: Payer: PPO

## 2023-07-29 ENCOUNTER — Encounter: Payer: Self-pay | Admitting: Oncology

## 2023-07-29 VITALS — BP 118/55 | HR 86 | Temp 98.4°F | Resp 18 | Ht 60.0 in | Wt 204.0 lb

## 2023-07-29 DIAGNOSIS — D509 Iron deficiency anemia, unspecified: Secondary | ICD-10-CM

## 2023-07-29 DIAGNOSIS — M48061 Spinal stenosis, lumbar region without neurogenic claudication: Secondary | ICD-10-CM | POA: Diagnosis not present

## 2023-07-29 NOTE — Progress Notes (Signed)
36.9 C)  SpO2: 98%     Body mass index is 39.84 kg/m.    ECOG FS:1 - Symptomatic but completely ambulatory  General: Well-developed, well-nourished, no acute distress. Eyes: Pink conjunctiva, anicteric sclera. HEENT: Normocephalic, moist mucous membranes. Lungs: No audible wheezing or coughing. Heart: Regular rate and rhythm. Abdomen: Soft, nontender, no obvious distention. Musculoskeletal: No edema, cyanosis, or clubbing. Neuro: Alert, answering all questions appropriately. Cranial nerves grossly intact. Skin: No rashes or petechiae noted. Psych: Normal affect.  LAB RESULTS:  Lab Results  Component Value Date   NA 138 05/06/2023   K 4.6 05/06/2023   CL 103 05/06/2023   CO2 25 05/06/2023   GLUCOSE 93 05/06/2023   BUN 15 05/06/2023   CREATININE 0.72 05/06/2023   CALCIUM 9.5 05/06/2023   PROT 7.0 12/28/2022   ALBUMIN 4.0 12/28/2022   AST 13 12/28/2022   ALT 9 12/28/2022   ALKPHOS 86 12/28/2022   BILITOT 0.3 12/28/2022   GFRNONAA >60 05/06/2023    Lab Results  Component Value Date   WBC 9.3 07/28/2023   NEUTROABS 6.6 07/28/2023   HGB 12.2 07/28/2023   HCT 39.7  07/28/2023   MCV 91.9 07/28/2023   PLT 197 07/28/2023   Lab Results  Component Value Date   IRON 33 07/28/2023   TIBC 428 07/28/2023   IRONPCTSAT 8 (L) 07/28/2023   Lab Results  Component Value Date   FERRITIN 9 (L) 07/28/2023     STUDIES: VAS [place] MESENTERIC  Result Date: 07/21/2023 ABDOMINAL VISCERAL Patient Name:  Alicia [name]  Date of Exam:   07/21/2023 Medical Rec #: [id number]            Accession #:    [id number] Date of Birth: 09-Jun-1946           Patient Gender: F Patient Age:   77 years Exam Location:  [facility] Procedure:      VAS [place] MESENTERIC Referring Phys: [id number] Dennard Schaumann CAIN -------------------------------------------------------------------------------- Indications: Follow up celiac artery stenting 7 x 29mm VBX High Risk Factors: Hyperlipidemia, current smoker. Limitations: Air/bowel gas, obesity and patient discomfort. Comparison Study: 12/30/22 Mesenteric duplex limited- Mesenteric: Celiac artery                   stent is patent with 70 to 99% stenosis in the celiac                   artery, however could not clearly visualize stent walls.                    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                    Unable to adequately visualize SMA and IMA secondary to                   overlying bowel gas.                    Incidental finding: aneurysmal dilatation of the distal aorta                   measuring 3.78cm in its largest diameter. Performing Technologist: [name] MHA, RDMS, RVT, RDCS  Examination Guidelines: A complete evaluation includes B-mode imaging, spectral Doppler, color Doppler, and power Doppler as needed of  [facility]  Telephone:(336) [phone] [phone]  ID: Alicia [name] OB: 1945/12/14  MR#: [id number]  AOZ#:[id number]  Patient Care Team: Mort Sawyers, FNP as PCP - General (Family Medicine)  CHIEF COMPLAINT: Iron deficiency anemia.  INTERVAL HISTORY: Patient returns to clinic today for repeat laboratory work, further evaluation, and consideration of additional IV Venofer.  She continues to have significant back pain and plans to have surgery in the near future.  She otherwise feels well.  She has no neurologic complaints.  She denies any recent fevers or illnesses.  She has a good appetite and denies weight loss.  She has no chest pain, shortness of breath, cough, or hemoptysis.  She denies any nausea, vomiting, constipation, or diarrhea.  She has no melena or hematochezia.   She has no urinary complaints.  Patient offers no further specific complaints today.  REVIEW OF SYSTEMS:   Review of Systems  Constitutional:  Positive for malaise/fatigue. Negative for fever and weight loss.  Respiratory: Negative.  Negative for cough, hemoptysis and shortness of breath.   Cardiovascular: Negative.  Negative for chest pain and leg swelling.  Gastrointestinal: Negative.  Negative for abdominal pain, blood in stool, constipation, diarrhea, melena and nausea.  Genitourinary: Negative.  Negative for hematuria.  Musculoskeletal:  Positive for back pain. Negative for joint pain.  Skin: Negative.  Negative for rash.  Neurological: Negative.  Negative for dizziness, focal weakness, weakness and headaches.    As per HPI. Otherwise, a complete review of systems is negative.  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   R mid forearm, [facility]    PAST SURGICAL HISTORY: Past Surgical History:  Procedure Laterality Date   APPENDECTOMY  10/2016   BICEPT TENODESIS Left 05/18/2023   Procedure: BICEPS TENODESIS;  Surgeon: Cammy Copa, MD;  Location: [facility] OR;  Service: Orthopedics;  Laterality: Left;   BREAST BIOPSY Right 2022   CELIAC ARTERY STENT     CHOLECYSTECTOMY  10/2016   COLONOSCOPY     ENTEROSCOPY N/A 12/17/2022   Procedure: ENTEROSCOPY;  Surgeon: Imogene Burn, MD;  Location: [facility] END[facility]COPY;  Service: Gastroenterology;  Laterality: N/A;  small bowel enteroscopy   EYE SURGERY Bilateral 2017   cataracts removed   HOT HEMOSTASIS  12/17/2022   Procedure: HOT HEMOSTASIS (ARGON PLASMA COAGULATION/BICAP);  Surgeon: Imogene Burn, MD;  Location: [facility];  Service: Gastroenterology;;   LUMBAR LAMINECTOMY/DECOMPRESSION MICRODISCECTOMY Left 07/21/2021   Procedure: Laminectomy and Foraminotomy - L4-L5 - left;  Surgeon: Donalee Citrin, MD;  Location: [facility] OR;  Service: Neurosurgery;  Laterality: Left;  3C   PAROTID GLAND TUMOR EXCISION Left 2003   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   Procedure: PERIPHERAL VASCULAR INTERVENTION;  Surgeon: Maeola Harman, MD;  Location: [facility] INVASIVE CV LAB;  [facility]  Telephone:(336) [phone] [phone]) [phone]  ID: Alicia [name] OB: 1945/12/14  MR#: [id number]  AOZ#:[id number]  Patient Care Team: Mort Sawyers, FNP as PCP - General (Family Medicine)  CHIEF COMPLAINT: Iron deficiency anemia.  INTERVAL HISTORY: Patient returns to clinic today for repeat laboratory work, further evaluation, and consideration of additional IV Venofer.  She continues to have significant back pain and plans to have surgery in the near future.  She otherwise feels well.  She has no neurologic complaints.  She denies any recent fevers or illnesses.  She has a good appetite and denies weight loss.  She has no chest pain, shortness of breath, cough, or hemoptysis.  She denies any nausea, vomiting, constipation, or diarrhea.  She has no melena or hematochezia.   She has no urinary complaints.  Patient offers no further specific complaints today.  REVIEW OF SYSTEMS:   Review of Systems  Constitutional:  Positive for malaise/fatigue. Negative for fever and weight loss.  Respiratory: Negative.  Negative for cough, hemoptysis and shortness of breath.   Cardiovascular: Negative.  Negative for chest pain and leg swelling.  Gastrointestinal: Negative.  Negative for abdominal pain, blood in stool, constipation, diarrhea, melena and nausea.  Genitourinary: Negative.  Negative for hematuria.  Musculoskeletal:  Positive for back pain. Negative for joint pain.  Skin: Negative.  Negative for rash.  Neurological: Negative.  Negative for dizziness, focal weakness, weakness and headaches.    As per HPI. Otherwise, a complete review of systems is negative.  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   R mid forearm, [facility]    PAST SURGICAL HISTORY: Past Surgical History:  Procedure Laterality Date   APPENDECTOMY  10/2016   BICEPT TENODESIS Left 05/18/2023   Procedure: BICEPS TENODESIS;  Surgeon: Cammy Copa, MD;  Location: [facility] OR;  Service: Orthopedics;  Laterality: Left;   BREAST BIOPSY Right 2022   CELIAC ARTERY STENT     CHOLECYSTECTOMY  10/2016   COLONOSCOPY     ENTEROSCOPY N/A 12/17/2022   Procedure: ENTEROSCOPY;  Surgeon: Imogene Burn, MD;  Location: [facility];  Service: Gastroenterology;  Laterality: N/A;  small bowel enteroscopy   EYE SURGERY Bilateral 2017   cataracts removed   HOT HEMOSTASIS  12/17/2022   Procedure: HOT HEMOSTASIS (ARGON PLASMA COAGULATION/BICAP);  Surgeon: Imogene Burn, MD;  Location: [facility];  Service: Gastroenterology;;   LUMBAR LAMINECTOMY/DECOMPRESSION MICRODISCECTOMY Left 07/21/2021   Procedure: Laminectomy and Foraminotomy - L4-L5 - left;  Surgeon: Donalee Citrin, MD;  Location: [facility] OR;  Service: Neurosurgery;  Laterality: Left;  3C   PAROTID GLAND TUMOR EXCISION Left 2003   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   Procedure: PERIPHERAL VASCULAR INTERVENTION;  Surgeon: Maeola Harman, MD;  Location: [facility] INVASIVE CV LAB;  [facility]  Telephone:(336) [phone] [phone]) [phone]  ID: Alicia [name] OB: 1945/12/14  MR#: [id number]  AOZ#:[id number]  Patient Care Team: Mort Sawyers, FNP as PCP - General (Family Medicine)  CHIEF COMPLAINT: Iron deficiency anemia.  INTERVAL HISTORY: Patient returns to clinic today for repeat laboratory work, further evaluation, and consideration of additional IV Venofer.  She continues to have significant back pain and plans to have surgery in the near future.  She otherwise feels well.  She has no neurologic complaints.  She denies any recent fevers or illnesses.  She has a good appetite and denies weight loss.  She has no chest pain, shortness of breath, cough, or hemoptysis.  She denies any nausea, vomiting, constipation, or diarrhea.  She has no melena or hematochezia.   She has no urinary complaints.  Patient offers no further specific complaints today.  REVIEW OF SYSTEMS:   Review of Systems  Constitutional:  Positive for malaise/fatigue. Negative for fever and weight loss.  Respiratory: Negative.  Negative for cough, hemoptysis and shortness of breath.   Cardiovascular: Negative.  Negative for chest pain and leg swelling.  Gastrointestinal: Negative.  Negative for abdominal pain, blood in stool, constipation, diarrhea, melena and nausea.  Genitourinary: Negative.  Negative for hematuria.  Musculoskeletal:  Positive for back pain. Negative for joint pain.  Skin: Negative.  Negative for rash.  Neurological: Negative.  Negative for dizziness, focal weakness, weakness and headaches.    As per HPI. Otherwise, a complete review of systems is negative.  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   R mid forearm, [facility]    PAST SURGICAL HISTORY: Past Surgical History:  Procedure Laterality Date   APPENDECTOMY  10/2016   BICEPT TENODESIS Left 05/18/2023   Procedure: BICEPS TENODESIS;  Surgeon: Cammy Copa, MD;  Location: [facility] OR;  Service: Orthopedics;  Laterality: Left;   BREAST BIOPSY Right 2022   CELIAC ARTERY STENT     CHOLECYSTECTOMY  10/2016   COLONOSCOPY     ENTEROSCOPY N/A 12/17/2022   Procedure: ENTEROSCOPY;  Surgeon: Imogene Burn, MD;  Location: [facility];  Service: Gastroenterology;  Laterality: N/A;  small bowel enteroscopy   EYE SURGERY Bilateral 2017   cataracts removed   HOT HEMOSTASIS  12/17/2022   Procedure: HOT HEMOSTASIS (ARGON PLASMA COAGULATION/BICAP);  Surgeon: Imogene Burn, MD;  Location: [facility];  Service: Gastroenterology;;   LUMBAR LAMINECTOMY/DECOMPRESSION MICRODISCECTOMY Left 07/21/2021   Procedure: Laminectomy and Foraminotomy - L4-L5 - left;  Surgeon: Donalee Citrin, MD;  Location: [facility] OR;  Service: Neurosurgery;  Laterality: Left;  3C   PAROTID GLAND TUMOR EXCISION Left 2003   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   Procedure: PERIPHERAL VASCULAR INTERVENTION;  Surgeon: Maeola Harman, MD;  Location: [facility] INVASIVE CV LAB;  36.9 C)  SpO2: 98%     Body mass index is 39.84 kg/m.    ECOG FS:1 - Symptomatic but completely ambulatory  General: Well-developed, well-nourished, no acute distress. Eyes: Pink conjunctiva, anicteric sclera. HEENT: Normocephalic, moist mucous membranes. Lungs: No audible wheezing or coughing. Heart: Regular rate and rhythm. Abdomen: Soft, nontender, no obvious distention. Musculoskeletal: No edema, cyanosis, or clubbing. Neuro: Alert, answering all questions appropriately. Cranial nerves grossly intact. Skin: No rashes or petechiae noted. Psych: Normal affect.  LAB RESULTS:  Lab Results  Component Value Date   NA 138 05/06/2023   K 4.6 05/06/2023   CL 103 05/06/2023   CO2 25 05/06/2023   GLUCOSE 93 05/06/2023   BUN 15 05/06/2023   CREATININE 0.72 05/06/2023   CALCIUM 9.5 05/06/2023   PROT 7.0 12/28/2022   ALBUMIN 4.0 12/28/2022   AST 13 12/28/2022   ALT 9 12/28/2022   ALKPHOS 86 12/28/2022   BILITOT 0.3 12/28/2022   GFRNONAA >60 05/06/2023    Lab Results  Component Value Date   WBC 9.3 07/28/2023   NEUTROABS 6.6 07/28/2023   HGB 12.2 07/28/2023   HCT 39.7  07/28/2023   MCV 91.9 07/28/2023   PLT 197 07/28/2023   Lab Results  Component Value Date   IRON 33 07/28/2023   TIBC 428 07/28/2023   IRONPCTSAT 8 (L) 07/28/2023   Lab Results  Component Value Date   FERRITIN 9 (L) 07/28/2023     STUDIES: VAS [place] MESENTERIC  Result Date: 07/21/2023 ABDOMINAL VISCERAL Patient Name:  Alicia [name]  Date of Exam:   07/21/2023 Medical Rec #: [id number]            Accession #:    [id number] Date of Birth: 09-Jun-1946           Patient Gender: F Patient Age:   77 years Exam Location:  [facility] Procedure:      VAS [place] MESENTERIC Referring Phys: [id number] Dennard Schaumann CAIN -------------------------------------------------------------------------------- Indications: Follow up celiac artery stenting 7 x 29mm VBX High Risk Factors: Hyperlipidemia, current smoker. Limitations: Air/bowel gas, obesity and patient discomfort. Comparison Study: 12/30/22 Mesenteric duplex limited- Mesenteric: Celiac artery                   stent is patent with 70 to 99% stenosis in the celiac                   artery, however could not clearly visualize stent walls.                    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                    Unable to adequately visualize SMA and IMA secondary to                   overlying bowel gas.                    Incidental finding: aneurysmal dilatation of the distal aorta                   measuring 3.78cm in its largest diameter. Performing Technologist: [name] MHA, RDMS, RVT, RDCS  Examination Guidelines: A complete evaluation includes B-mode imaging, spectral Doppler, color Doppler, and power Doppler as needed of

## 2023-07-30 ENCOUNTER — Other Ambulatory Visit: Payer: Self-pay | Admitting: Neurosurgery

## 2023-08-02 ENCOUNTER — Encounter: Payer: Self-pay | Admitting: Dermatology

## 2023-08-02 ENCOUNTER — Ambulatory Visit (INDEPENDENT_AMBULATORY_CARE_PROVIDER_SITE_OTHER): Payer: PPO | Admitting: Dermatology

## 2023-08-02 ENCOUNTER — Ambulatory Visit (INDEPENDENT_AMBULATORY_CARE_PROVIDER_SITE_OTHER): Payer: PPO | Admitting: Family Medicine

## 2023-08-02 ENCOUNTER — Encounter: Payer: Self-pay | Admitting: Family Medicine

## 2023-08-02 VITALS — BP 141/80

## 2023-08-02 VITALS — BP 132/70 | HR 75 | Temp 97.9°F | Ht 60.0 in | Wt 199.4 lb

## 2023-08-02 DIAGNOSIS — L821 Other seborrheic keratosis: Secondary | ICD-10-CM

## 2023-08-02 DIAGNOSIS — N3000 Acute cystitis without hematuria: Secondary | ICD-10-CM | POA: Insufficient documentation

## 2023-08-02 DIAGNOSIS — W908XXA Exposure to other nonionizing radiation, initial encounter: Secondary | ICD-10-CM | POA: Diagnosis not present

## 2023-08-02 DIAGNOSIS — L578 Other skin changes due to chronic exposure to nonionizing radiation: Secondary | ICD-10-CM | POA: Diagnosis not present

## 2023-08-02 DIAGNOSIS — L304 Erythema intertrigo: Secondary | ICD-10-CM

## 2023-08-02 DIAGNOSIS — L570 Actinic keratosis: Secondary | ICD-10-CM | POA: Diagnosis not present

## 2023-08-02 DIAGNOSIS — L820 Inflamed seborrheic keratosis: Secondary | ICD-10-CM

## 2023-08-02 DIAGNOSIS — L814 Other melanin hyperpigmentation: Secondary | ICD-10-CM | POA: Diagnosis not present

## 2023-08-02 DIAGNOSIS — D225 Melanocytic nevi of trunk: Secondary | ICD-10-CM

## 2023-08-02 DIAGNOSIS — D229 Melanocytic nevi, unspecified: Secondary | ICD-10-CM

## 2023-08-02 DIAGNOSIS — R300 Dysuria: Secondary | ICD-10-CM | POA: Diagnosis not present

## 2023-08-02 LAB — POC URINALSYSI DIPSTICK (AUTOMATED)
Bilirubin, UA: NEGATIVE
Blood, UA: 200 — AB
Glucose, UA: NEGATIVE
Ketones, UA: NEGATIVE
Nitrite, UA: NEGATIVE
Protein, UA: POSITIVE — AB
Spec Grav, UA: 1.015 (ref 1.010–1.025)
Urobilinogen, UA: 0.2 E.U./dL
pH, UA: 6 (ref 5.0–8.0)

## 2023-08-02 MED ORDER — CEPHALEXIN 500 MG PO CAPS: 500.0000 mg | ORAL_CAPSULE | Freq: Two times a day (BID) | ORAL | 0 refills | Status: DC

## 2023-08-02 NOTE — Progress Notes (Signed)
Subjective:    Patient ID: Alicia [name], female    DOB: 01-03-1946, 77 y.o.   MRN: [id number]  HPI  Wt Readings from Last 3 Encounters:  08/02/23 199 lb 6 oz (90.4 kg)  07/29/23 204 lb (92.5 kg)  05/18/23 200 lb (90.7 kg)   38.94 kg/m  Vitals:   08/02/23 1049  BP: 132/70  Pulse: 75  Temp: 97.9 F (36.6 C)  SpO2: 95%    77 yo pt of NP Dugal presents with urinary symptoms   Had for 3 weeks   Urine odor Dysuria  Cloudy urine  Frequency   Incontinence worse than usual   No blood   Low back and bladder area pain   No fever  (yesterday 98(  No n/v   Has surgery planned Monday - back surgery   Results for orders placed or performed in visit on 08/02/23  POCT Urinalysis Dipstick (Automated)  Result Value Ref Range   Color, UA Yellow    Clarity, UA Cloudy    Glucose, UA Negative Negative   Bilirubin, UA Negative    Ketones, UA Negative    Spec Grav, UA 1.015 1.010 - 1.025   Blood, UA 200 Ery/uL (A)    pH, UA 6.0 5.0 - 8.0   Protein, UA Positive (A) Negative   Urobilinogen, UA 0.2 0.2 or 1.0 E.U./dL   Nitrite, UA Negative    Leukocytes, UA Large (3+) (A) Negative    Smoker 1ppd  No interest in quitting     Patient Active Problem List   Diagnosis Date Noted   Acute cystitis 08/02/2023   Biceps tendonitis on left 06/05/2023   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 05/18/2023   Class 2 severe obesity due to excess calories with serious comorbidity and body mass index (BMI) of 37.0 to 37.9 in adult (HCC) 12/28/2022   On statin therapy 12/28/2022   S/P reverse total shoulder arthroplasty, right 08/06/2022   Secondary osteoarthritis of right shoulder due to rotator cuff tear 06/16/2022   Seborrheic dermatitis 06/16/2022   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/2022   Centrilobular emphysema (HCC) 02/13/2022   Cigarette smoker 02/06/2022   Asthmatic bronchitis , chronic 02/05/2022   Incidental  pulmonary nodule, greater than or equal to 8mm 01/21/2022   Bilateral carotid artery stenosis 12/26/2021   Iron deficiency anemia 12/26/2021   Mixed hyperlipidemia 12/26/2021   Chronic cough 12/26/2021   Adjustment reaction with anxiety and depression 12/26/2021   Chronic venous insufficiency 02/29/2020   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/05/2019   Arthritis of shoulder 06/08/2018   GERD (gastroesophageal reflux disease) 06/08/2018   Macular degeneration 06/08/2018   Sleep apnea 06/08/2018   Past Medical History:  Diagnosis Date   Actinic keratosis    Anemia    iron deficiency   Arthritis    Asthma    Carotid artery occlusion    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HCC)    has celiac artery stent   Depression    GERD (gastroesophageal reflux disease)    History of hiatal hernia    Macular degeneration    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, will need biopsy- 05/2023   Neuropathy    bilateral feet/legs   Peripheral vascular disease (HCC)    Mesenteric Artery Stent Placed 2023   Pneumonia    Positive fecal occult blood test 01/19/2022   Sleep apnea    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  R mid forearm, [facility]   Past Surgical History:  Procedure Laterality Date   APPENDECTOMY  10/2016   BICEPT TENODESIS Left 05/18/2023   Procedure: BICEPS TENODESIS;  Surgeon: Cammy Copa, MD;  Location: [facility] OR;  Service: Orthopedics;  Laterality: Left;   BREAST BIOPSY Right 2022   CELIAC ARTERY STENT     CHOLECYSTECTOMY  10/2016   COLONOSCOPY     ENTEROSCOPY N/A 12/17/2022   Procedure: ENTEROSCOPY;  Surgeon: Imogene Burn, MD;  Location: [facility] ENDOSCOPY;  Service: Gastroenterology;  Laterality: N/A;  small bowel enteroscopy   EYE SURGERY Bilateral 2017   cataracts removed   HOT HEMOSTASIS  12/17/2022   Procedure: HOT HEMOSTASIS (ARGON PLASMA COAGULATION/BICAP);  Surgeon: Imogene Burn, MD;  Location: [facility];  Service: Gastroenterology;;   LUMBAR  LAMINECTOMY/DECOMPRESSION MICRODISCECTOMY Left 07/21/2021   Procedure: Laminectomy and Foraminotomy - L4-L5 - left;  Surgeon: Donalee Citrin, MD;  Location: [facility] OR;  Service: Neurosurgery;  Laterality: Left;  3C   PAROTID GLAND TUMOR EXCISION Left 2003   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   Procedure: PERIPHERAL VASCULAR INTERVENTION;  Surgeon: Maeola Harman, MD;  Location: [facility] INVASIVE CV LAB;  Service: Cardiovascular;;  celiac   REVERSE SHOULDER ARTHROPLASTY Left 05/18/2023   Procedure: LEFT REVERSE SHOULDER ARTHROPLASTY;  Surgeon: Cammy Copa, MD;  Location: [facility] OR;  Service: Orthopedics;  Laterality: Left;   TOTAL SHOULDER ARTHROPLASTY Right 08/06/2022   Procedure: RIGHT REVERSE SHOULDER ARTHROPLASTY;  Surgeon: Cammy Copa, MD;  Location: [facility] OR;  Service: Orthopedics;  Laterality: Right;   VISCERAL ANGIOGRAPHY N/A 05/18/2022   Procedure: VISCERAL ANGIOGRAPHY;  Surgeon: Maeola Harman, MD;  Location: [facility] INVASIVE CV LAB;  Service: Cardiovascular;  Laterality: N/A;   Social History   Tobacco Use   Smoking status: Every Day    Current packs/day: 1.00    Average packs/day: 1 pack/day for 62.0 years (62.0 ttl pk-yrs)    Types: Cigarettes    Passive exposure: Current   Smokeless tobacco: Never   Tobacco comments:    Smokes 1- 1.5 PPD 01/29/2023  Vaping Use   Vaping status: Never Used  Substance Use Topics   Alcohol use: Not Currently   Drug use: Never   Family History  Problem Relation Age of Onset   Heart disease Mother    Cerebral aneurysm Father    Allergies  Allergen Reactions   Amoxicillin Other (See Comments)    Yeast infections   Current Outpatient Medications on File Prior to Visit  Medication Sig Dispense Refill   acetaminophen (TYLENOL) 500 MG tablet Take 1,500 mg by mouth in the morning and at bedtime.     albuterol (VENTOLIN HFA) 108 (90 Base) MCG/ACT inhaler INHALE 1-2 PUFFS INTO THE LUNGS 4 (FOUR) TIMES DAILY AS NEEDED FOR  SHORTNESS OF BREATH OR WHEEZING. 18 each 0   aspirin EC 81 MG tablet Take 81 mg by mouth every other day. Swallow whole.     BREZTRI AEROSPHERE 160-9-4.8 MCG/ACT AERO INHALE 2 PUFFS INTO THE LUNGS TWICE DAILY. 10.7 each 11   Cholecalciferol (VITAMIN D3) 125 MCG (5000 UT) CAPS Take 5,000 Units by mouth in the morning.     gabapentin (NEURONTIN) 300 MG capsule TAKE 1 CAPSULE BY MOUTH THREE TIMES A DAY (Patient taking differently: Take 300 mg by mouth 3 (three) times daily.) 270 capsule 1   hydrocortisone 2.5 % cream Apply to rash on face and under breasts once to twice daily as directed. (Patient taking differently: Apply 1  Application topically 3 (three) times a week.) 30 g 2   Lutein 20 MG TABS Take 20 mg by mouth in the morning.     Magnesium 400 MG TABS Take 400 mg by mouth at bedtime.     omeprazole (PRILOSEC) 40 MG capsule TAKE 1 CAPSULE (40 MG TOTAL) BY MOUTH DAILY. 90 capsule 1   Polyvinyl Alcohol-Povidone (REFRESH OP) Place 1 drop into both eyes daily as needed (dry eye).     pramipexole (MIRAPEX) 0.5 MG tablet TAKE 1 TABLET BY MOUTH TWICE A DAY (Patient taking differently: Take 0.5 mg by mouth in the morning and at bedtime.) 180 tablet 1   rosuvastatin (CRESTOR) 10 MG tablet TAKE 1 TABLET BY MOUTH EVERY DAY 90 tablet 3   venlafaxine XR (EFFEXOR-XR) 75 MG 24 hr capsule Take 1 capsule (75 mg total) by mouth daily. 90 capsule 3   No current facility-administered medications on file prior to visit.    Review of Systems  Constitutional:  Positive for fatigue. Negative for activity change, appetite change and fever.  HENT:  Negative for congestion and sore throat.   Eyes:  Negative for itching and visual disturbance.  Respiratory:  Negative for cough and shortness of breath.   Cardiovascular:  Negative for leg swelling.  Gastrointestinal:  Negative for abdominal distention, abdominal pain, constipation, diarrhea and nausea.  Endocrine: Negative for cold intolerance and polydipsia.   Genitourinary:  Positive for dysuria, frequency and urgency. Negative for difficulty urinating, flank pain and hematuria.  Musculoskeletal:  Positive for back pain. Negative for myalgias.  Skin:  Negative for rash.  Allergic/Immunologic: Negative for immunocompromised state.  Neurological:  Negative for dizziness and weakness.  Hematological:  Negative for adenopathy.       Objective:   Physical Exam Constitutional:      General: She is not in acute distress.    Appearance: Normal appearance. She is well-developed. She is obese. She is not ill-appearing or diaphoretic.  HENT:     Head: Normocephalic and atraumatic.  Eyes:     Conjunctiva/sclera: Conjunctivae normal.     Pupils: Pupils are equal, round, and reactive to light.  Cardiovascular:     Rate and Rhythm: Normal rate and regular rhythm.     Heart sounds: Normal heart sounds.  Pulmonary:     Effort: Pulmonary effort is normal.     Breath sounds: Normal breath sounds.  Abdominal:     General: Bowel sounds are normal. There is no distension.     Palpations: Abdomen is soft.     Tenderness: There is abdominal tenderness. There is no rebound.     Comments: No cva tenderness  Mild suprapubic tenderness without fullness or distension   Musculoskeletal:     Cervical back: Normal range of motion and neck supple.     Comments: Limited rom LS  Walks with cane   Lymphadenopathy:     Cervical: No cervical adenopathy.  Skin:    Findings: No rash.  Neurological:     Mental Status: She is alert.  Psychiatric:        Mood and Affect: Mood normal.           Assessment & Plan:   Problem List Items Addressed This Visit       Genitourinary   Acute cystitis - Primary    With frequency/ dysuria for 3 weeks  Reassuring exam  Urinalysis positive for leuk and protein   Treat with keflex  Pending culture  Encouraged fluids Instructed to  call if symptoms worsen in meantime  Update if not starting to improve in a week or  if worsening  Call back and Er precautions noted in detail today   Has back surgery planned next monday      Relevant Orders   Urine Culture   Other Visit Diagnoses     Dysuria       Relevant Orders   POCT Urinalysis Dipstick (Automated) (Completed)

## 2023-08-02 NOTE — Patient Instructions (Signed)
Keep drinnkig lots of fluid   Take the generic keflex as directed   We will reach out with culture result as soon as it returns   If symptoms worsen in the meantime let us know

## 2023-08-02 NOTE — Patient Instructions (Signed)
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
## 2023-08-02 NOTE — Assessment & Plan Note (Signed)
With frequency/ dysuria for 3 weeks  Reassuring exam  Urinalysis positive for leuk and protein   Treat with keflex  Pending culture  Encouraged fluids Instructed to call if symptoms worsen in meantime  Update if not starting to improve in a week or if worsening  Call back and Er precautions noted in detail today   Has back surgery planned next monday

## 2023-08-04 ENCOUNTER — Other Ambulatory Visit: Payer: Self-pay | Admitting: Neurosurgery

## 2023-08-05 LAB — URINE CULTURE
MICRO NUMBER:: 15502321
SPECIMEN QUALITY:: ADEQUATE

## 2023-08-05 NOTE — Progress Notes (Signed)
Surgical Instructions   Your procedure is scheduled on August 09, 2023. Report to [facility] Main Entrance "A" at 12:30 P.M., then check in with the Admitting office. Any questions or running late day of surgery: call [phone]  Questions prior to your surgery date: call [phone], Monday-Friday, 8am-4pm. If you experience any cold or flu symptoms such as cough, fever, chills, shortness of breath, etc. between now and your scheduled surgery, please notify us at the above number.     Remember:  Do not eat or drink after midnight the night before your surgery    Take these medicines the morning of surgery with A SIP OF WATER   acetaminophen (TYLENOL)  BREZTRI INHALER cephALEXin (KEFLEX)   gabapentin (NEURONTIN)  omeprazole (PRILOSEC)  pramipexole (MIRAPEX)  rosuvastatin (CRESTOR)   May take these medicines IF NEEDED: albuterol (VENTOLIN HFA) inhaler Polyvinyl Alcohol-Povidone (REFRESH OP)  venlafaxine XR (EFFEXOR-XR)    Follow your prescriber's instructions on when to stop your aspirin.  One week prior to surgery, STOP taking any Aleve, Naproxen, Ibuprofen, Motrin, Advil, Goody's, BC's, all herbal medications, fish oil, and non-prescription vitamins.                     Do NOT Smoke (Tobacco/Vaping) for 24 hours prior to your procedure.  If you use a CPAP at night, you may bring your mask/headgear for your overnight stay.   You will be asked to remove any contacts, glasses, piercing's, hearing aid's, dentures/partials prior to surgery. Please bring cases for these items if needed.    Patients discharged the day of surgery will not be allowed to drive home, and someone needs to stay with them for 24 hours.  SURGICAL WAITING ROOM VISITATION Patients may have no more than 2 support people in the waiting area - these visitors may rotate.   Pre-op nurse will coordinate an appropriate time for 1 ADULT support person, who may not rotate, to accompany patient in pre-op.   Children under the age of 83 must have an adult with them who is not the patient and must remain in the main waiting area with an adult.  If the patient needs to stay at the hospital during part of their recovery, the visitor guidelines for inpatient rooms apply.  Please refer to the [facility] website for the visitor guidelines for any additional information.   If you received a COVID test during your pre-op visit  it is requested that you wear a mask when out in public, stay away from anyone that may not be feeling well and notify your surgeon if you develop symptoms. If you have been in contact with anyone that has tested positive in the last 10 days please notify you surgeon.      Pre-operative 5 CHG Bathing Instructions   You can play a key role in reducing the risk of infection after surgery. Your skin needs to be as free of germs as possible. You can reduce the number of germs on your skin by washing with CHG (chlorhexidine gluconate) soap before surgery. CHG is an antiseptic soap that kills germs and continues to kill germs even after washing.   DO NOT use if you have an allergy to chlorhexidine/CHG or antibacterial soaps. If your skin becomes reddened or irritated, stop using the CHG and notify one of our RNs at [phone].   Please shower with the CHG soap starting 4 days before surgery using the following schedule:     Please keep in  mind the following:  DO NOT shave, including legs and underarms, starting the day of your first shower.   You may shave your face at any point before/day of surgery.  Place clean sheets on your bed the day you start using CHG soap. Use a clean washcloth (not used since being washed) for each shower. DO NOT sleep with pets once you start using the CHG.   CHG Shower Instructions:  Wash your face and private area with normal soap. If you choose to wash your hair, wash first with your normal shampoo.  After you use shampoo/soap, rinse your hair  and body thoroughly to remove shampoo/soap residue.  Turn the water OFF and apply about 3 tablespoons (45 ml) of CHG soap to a CLEAN washcloth.  Apply CHG soap ONLY FROM YOUR NECK DOWN TO YOUR TOES (washing for 3-5 minutes)  DO NOT use CHG soap on face, private areas, open wounds, or sores.  Pay special attention to the area where your surgery is being performed.  If you are having back surgery, having someone wash your back for you may be helpful. Wait 2 minutes after CHG soap is applied, then you may rinse off the CHG soap.  Pat dry with a clean towel  Put on clean clothes/pajamas   If you choose to wear lotion, please use ONLY the CHG-compatible lotions on the back of this paper.   Additional instructions for the day of surgery: DO NOT APPLY any lotions, deodorants, cologne, or perfumes.   Do not bring valuables to the hospital. [facility] is not responsible for any belongings/valuables. Do not wear nail polish, gel polish, artificial nails, or any other type of covering on natural nails (fingers and toes) Do not wear jewelry or makeup Put on clean/comfortable clothes.  Please brush your teeth.  Ask your nurse before applying any prescription medications to the skin.     CHG Compatible Lotions   Aveeno Moisturizing lotion  Cetaphil Moisturizing Cream  Cetaphil Moisturizing Lotion  Clairol Herbal Essence Moisturizing Lotion, Dry Skin  Clairol Herbal Essence Moisturizing Lotion, Extra Dry Skin  Clairol Herbal Essence Moisturizing Lotion, Normal Skin  Curel Age Defying Therapeutic Moisturizing Lotion with Alpha Hydroxy  Curel Extreme Care Body Lotion  Curel Soothing Hands Moisturizing Hand Lotion  Curel Therapeutic Moisturizing Cream, Fragrance-Free  Curel Therapeutic Moisturizing Lotion, Fragrance-Free  Curel Therapeutic Moisturizing Lotion, Original Formula  Eucerin Daily Replenishing Lotion  Eucerin Dry Skin Therapy Plus Alpha Hydroxy Crme  Eucerin Dry Skin Therapy Plus  Alpha Hydroxy Lotion  Eucerin Original Crme  Eucerin Original Lotion  Eucerin Plus Crme Eucerin Plus Lotion  Eucerin TriLipid Replenishing Lotion  Keri Anti-Bacterial Hand Lotion  Keri Deep Conditioning Original Lotion Dry Skin Formula Softly Scented  Keri Deep Conditioning Original Lotion, Fragrance Free Sensitive Skin Formula  Keri Lotion Fast Absorbing Fragrance Free Sensitive Skin Formula  Keri Lotion Fast Absorbing Softly Scented Dry Skin Formula  Keri Original Lotion  Keri Skin Renewal Lotion Keri Silky Smooth Lotion  Keri Silky Smooth Sensitive Skin Lotion  Nivea Body Creamy Conditioning Oil  Nivea Body Extra Enriched Lotion  Nivea Body Original Lotion  Nivea Body Sheer Moisturizing Lotion Nivea Crme  Nivea Skin Firming Lotion  NutraDerm 30 Skin Lotion  NutraDerm Skin Lotion  NutraDerm Therapeutic Skin Cream  NutraDerm Therapeutic Skin Lotion  ProShield Protective Hand Cream  Provon moisturizing lotion  Please read over the following fact sheets that you were given.

## 2023-08-06 ENCOUNTER — Other Ambulatory Visit: Payer: Self-pay

## 2023-08-06 ENCOUNTER — Encounter (HOSPITAL_COMMUNITY)
Admission: RE | Admit: 2023-08-06 | Discharge: 2023-08-06 | Disposition: A | Discharge status: Home / Self Care | Payer: PPO | Source: Ambulatory Visit | Attending: Neurosurgery | Admitting: Neurosurgery

## 2023-08-06 ENCOUNTER — Encounter (HOSPITAL_COMMUNITY): Payer: Self-pay

## 2023-08-06 ENCOUNTER — Telehealth: Payer: Self-pay | Admitting: Family

## 2023-08-06 VITALS — BP 138/71 | HR 93 | Temp 98.4°F | Resp 18 | Ht 60.0 in | Wt 206.5 lb

## 2023-08-06 DIAGNOSIS — Z01812 Encounter for preprocedural laboratory examination: Secondary | ICD-10-CM | POA: Diagnosis not present

## 2023-08-06 DIAGNOSIS — Z01818 Encounter for other preprocedural examination: Secondary | ICD-10-CM

## 2023-08-06 HISTORY — DX: Aneurysm of unspecified site: I72.9

## 2023-08-06 LAB — CBC
HCT: 39.2 % (ref 36.0–46.0)
Hemoglobin: 12.3 g/dL (ref 12.0–15.0)
MCH: 28.9 pg (ref 26.0–34.0)
MCHC: 31.4 g/dL (ref 30.0–36.0)
MCV: 92 fL (ref 80.0–100.0)
Platelets: 216 10*3/uL (ref 150–400)
RBC: 4.26 MIL/uL (ref 3.87–5.11)
RDW: 14.9 % (ref 11.5–15.5)
WBC: 7.3 10*3/uL (ref 4.0–10.5)
nRBC: 0 % (ref 0.0–0.2)

## 2023-08-06 LAB — SURGICAL PCR SCREEN
MRSA, PCR: NEGATIVE
Staphylococcus aureus: NEGATIVE

## 2023-08-06 LAB — BASIC METABOLIC PANEL WITH GFR
Anion gap: 7 (ref 5–15)
BUN: 19 mg/dL (ref 8–23)
CO2: 23 mmol/L (ref 22–32)
Calcium: 9 mg/dL (ref 8.9–10.3)
Chloride: 107 mmol/L (ref 98–111)
Creatinine, Ser: 0.96 mg/dL (ref 0.44–1.00)
GFR, Estimated: >60 mL/min
Glucose, Bld: 105 mg/dL — ABNORMAL HIGH (ref 70–99)
Potassium: 4.1 mmol/L (ref 3.5–5.1)
Sodium: 137 mmol/L (ref 135–145)

## 2023-08-06 NOTE — Telephone Encounter (Signed)
Added to results note.

## 2023-08-06 NOTE — Progress Notes (Signed)
Anesthesia Chart Review:  Case: [id number] Date/Time: 08/09/23 1418   Procedure: Sublaminar decompression - L2-L3 - right (Right: Back) - 3C   Anesthesia type: General   Pre-op diagnosis: Stenosis   [facility]: [facility] ROOM 21 / [facility]   Surgeons: Donalee Citrin, MD       DISCUSSION: Patient is a 77 year old female scheduled for the above procedure.    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   She last saw hematologist Dr. Orlie Dakin on 07/29/23 for iron deficiency anemia follow-up. IDA possibly secondary to non-bleeding angiectasias noted in her small intestines, s/p ATP 12/17/22 by GI Dr. Leonides Schanz. HGB has normalized, but iron stores remained decreased. S/p iron infusion 05/07/23. She did not wish to have another treatment at that time. Follow-up in 3 months planned. H/H 12.3/39.2 on 08/06/23.     Last visit with vascular surgeon Dr. Randie Heinz was on 06/24/22, s/p celiac artery stent 05/18/22. He recommended DAPT through 07/30/22 then could remain on ASA and statin therapy for mesenteric disease.  She has a 3.9 cm AAA by 07/21/23 mesenteric [place].    She is not routinely followed by cardiology, but she had an echo in 06/2021 as part of a preoperative cardiology evaluation by Dr. Clelia Croft at [facility] prior to her 07/2021 back surgery.   She was prescribed Keflex for E. Coli UTI on 08/02/23, to be completed by 08/09/23.    Reported her last ASA was on 08/03/23. Anesthesia team to evaluate on the day of surgery.   VS: BP 138/71   Pulse 93   Temp 36.9 C (Oral)   Resp 18   Ht 5' (1.524 m)   Wt 93.7 kg   SpO2 93%    BMI 40.33 kg/m    PROVIDERS: - Mort Sawyers, FNP is PCP  - Sandrea Hughs, MD is pulmonologist. Last visit 01/29/23. He wrote that she did not meet criteria for COPD, but had done great on Breztri, so would continue. Ok to try albuterol 15 minutes before activity (on alternating days). Six month follow-up planned.  Lemar Livings, MD is vascular surgeon - Norwood Levo, MD is GI - Gerarda Fraction, MD is HEM. Last visit 04/29/23 for IDA follow-up.  - She is not routinely followed by a cardiologist, but she had  a preoperative cardiology evaluation by Martha Clan, MD with [facility] ~ 06/2021 prior to 07/21/22 spinal surgery.     LABS: Labs reviewed: Acceptable for surgery. (all labs ordered are listed, but only abnormal results are displayed)  Labs Reviewed  BASIC METABOLIC PANEL - Abnormal; Notable for the following components:      Result Value   Glucose, Bld 105 (*)    All other components within normal limits  SURGICAL PCR SCREEN  CBC    OTHER: PFT's  01/28/23:  "FEV1 2.02 (120 % ) ratio 0.77  p 16 % improvement from saba p 0 prior to study with    and FV curve very min concavity with variable truncation of insp loop"   EGD 12/17/22: IMPRESSION: - Normal esophagus. - Hiatal hernia. - Erythematous mucosa in the antrum. 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 Treated with argon plasma coagulation (APC). -  Four non-bleeding angioectasias in the jejunum. Treated with argon plasma coagulation (APC). - No specimens collected.   Colonoscopy 05/08/22: IMPRESSION: - Three 3 to 4 mm polyps in the transverse colon, removed with a cold snare. Resected and retrieved. [Tubular adenomas without high grade dysplasia or malignancy, hyperplastic polyp] - Diverticulosis in the sigmoid colon. - Non-bleeding internal hemorrhoids. - Biopsies were taken with a cold forceps from the entire colon for evaluation of microscopic colitis. [No specific histopathologic changes, negative of  acute inflammation, increased intraepithelial lymphocytes or thickened subepithelial collagen table]   Splint Night Sleep Study 10/09/20 [facility]): IMPRESSION: Moderate obstructive sleep apnea hypopnea syndrome with an elevated AHI of 25.9 (normal < 5).  2. Hypoxemia, from apneas and hypopneas.  Normal sinus rhythm. Periodic limb movements of sleep.     IMAGES: [place] Mesenteric 07/21/23: Summary:  Largest Aortic Diameter: 3.9 cm. Mid aorta is aneurysmal, and has increased in size compared to prior ultrasound when it measured 3.78cm.  Mesenteric:  Very technically limited exam secondary to significant overlying bowel gas. Unable to visualize celiac artery stent. Grossly nondiagnostic given technical limitations.  - Comparison [place] 12/30/22: Celiac artery stent is patent with 70-99% stenosis in the celiac artery, however could not clearly visualize stent walls.Elevated velocities suggest >70% common hepatic artery stenosis. Unable to adequately visualize SMA and IMA secondary to overlying bowel gas. Distal aorta 3.78 cm.    MRI Pelvis 03/21/23: IMPRESSION: 1. Prominent marrow edema throughout the left sacral ala may reflect a superimposed acute insufficiency fracture. 2. Chronic fractures of the sacrum bilaterally and the right superior and inferior pubic rami with chronic nonunion. No other acute osseous findings. 3. Moderately advanced sacroiliac degenerative changes bilaterally. 4. Lumbar spine findings are dictated separately. 5. Muscular atrophy and gluteus and common hamstring tendinosis as described.   MRI L-spine 03/21/23: IMPRESSION: 1. No acute or subacute fracture. 2. L2-L3 severe right neural foraminal narrowing. 3. L3-L4 moderate to severe right neural foraminal narrowing. 4. L4-L5 severe left and mild right neural foraminal narrowing. 5. L5-S1 severe left and moderate right neural foraminal narrowing. 6. Narrowing of the lateral recesses on the right at  L2-L3, bilaterally at L3-L4, and on the left at L4-L5, which could affect the descending L3, L4, and L5 nerve roots, respectively.   CT Chest LCS 03/02/22: IMPRESSION: 1. Lung-RADS 2, benign appearance or behavior. Continue annual screening with low-dose chest CT without contrast in 12 months. 2. Coronary artery calcifications. 3. Aortic Atherosclerosis (ICD10-I70.0) and Emphysema (ICD10-J43.9).     EKG: 05/06/23: Normal sinus rhythm Inferior infarct , age undetermined Anteroseptal infarct , age undetermined Abnormal ECG When compared with ECG of 21-Jul-2021 07:14, PREVIOUS ECG IS PRESENT No significant change since last tracing Confirmed by Arvilla Meres ([id number]) on 05/06/2023 10:43:14 PM     CV: Echo 06/02/2021 [facility]; Media tab, Correspondence 07/22/21): 1.  LV size normal.  Mild concentric LVH.  Overall LV systolic function normal with a EF 55-60% 2.  LA normal in size and function  3.  RV normal in size and function. 4.  RA normal in size and function 5.  Mild aortic valve sclerosis without stenosis 6.  Mitral valve thickened with nodular degeneration.  No mitral regurgitation. 7.  Tricuspid valve appears structurally normal with normal function 8.  No pericardial effusion 9.  Aortic root, ascending aorta, and aortic arch are normal.     Carotid duplex 04/25/21:  - Right Carotid: Velocities in the right ICA are consistent with a 1-39%  stenosis.  - Left Carotid: Velocities in the left ICA are consistent with a 1-39% stenosis.  - Vertebrals:  Bilateral vertebral arteries demonstrate antegrade flow.  - Subclavians: Normal flow hemodynamics were seen in bilateral subclavian arteries.      Reported a stress test > 10 years ago.  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, will need biopsy- 05/2023   Neuropathy    bilateral feet/legs   Peripheral vascular disease (HCC)    Mesenteric Artery Stent Placed 2023   Pneumonia    Positive fecal occult blood test 01/19/2022   Sleep apnea    wears cpap   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   R mid forearm, [facility]    Past Surgical History:  Procedure Laterality Date   APPENDECTOMY  10/2016   BICEPT TENODESIS Left 05/18/2023   Procedure: BICEPS TENODESIS;  Surgeon: Cammy Copa, MD;  Location: [facility] OR;  Service: Orthopedics;  Laterality: Left;   BREAST BIOPSY Right 2022   CELIAC ARTERY STENT     CHOLECYSTECTOMY  10/2016   COLONOSCOPY     12/2022   ENTEROSCOPY N/A 12/17/2022   Procedure: ENTEROSCOPY;  Surgeon: Imogene Burn, MD;  Location: [facility] ENDOSCOPY;  Service: Gastroenterology;  Laterality: N/A;  small bowel enteroscopy   EYE SURGERY Bilateral 2017   cataracts removed   HOT HEMOSTASIS  12/17/2022   Procedure: HOT HEMOSTASIS (ARGON PLASMA COAGULATION/BICAP);  Surgeon: Imogene Burn, MD;  Location: [facility]COPY;  Service: Gastroenterology;;   LUMBAR LAMINECTOMY/DECOMPRESSION MICRODISCECTOMY Left 07/21/2021   Procedure: Laminectomy and Foraminotomy - L4-L5 - left;  Surgeon: Donalee Citrin, MD;  Location: [facility] OR;  Service: Neurosurgery;  Laterality: Left;  3C   PAROTID GLAND TUMOR EXCISION Left 2003   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   Procedure: PERIPHERAL VASCULAR INTERVENTION;  Surgeon: Maeola Harman, MD;  Location: [facility] INVASIVE CV LAB;  Service: Cardiovascular;;  celiac   REVERSE SHOULDER ARTHROPLASTY Left 05/18/2023   Procedure: LEFT REVERSE SHOULDER ARTHROPLASTY;  Surgeon: Cammy Copa, MD;  Location: [facility] OR;   Service: Orthopedics;  Laterality: Left;   TOTAL SHOULDER ARTHROPLASTY Right 08/06/2022   Procedure: RIGHT REVERSE SHOULDER ARTHROPLASTY;  Surgeon: Cammy Copa, MD;  Location: [facility] OR;  Service: Orthopedics;  Laterality: Right;   VISCERAL ANGIOGRAPHY N/A 05/18/2022   Procedure: VISCERAL ANGIOGRAPHY;  Surgeon: Maeola Harman, MD;  Location: [facility] INVASIVE CV LAB;  Service: Cardiovascular;  Laterality: N/A;    MEDICATIONS:  acetaminophen (TYLENOL) 500 MG tablet   albuterol (VENTOLIN HFA) 108 (90 Base) MCG/ACT inhaler   aspirin EC 81 MG tablet   BREZTRI AEROSPHERE 160-9-4.8 MCG/ACT AERO   cephALEXin (KEFLEX) 500 MG capsule   Cholecalciferol (VITAMIN D3) 125 MCG (5000 UT) CAPS   gabapentin (NEURONTIN) 300 MG capsule   hydrocortisone 2.5 % cream   Lutein 20 MG TABS   Magnesium 400 MG TABS   omeprazole (PRILOSEC) 40 MG capsule   Polyvinyl  Alcohol-Povidone (REFRESH OP)   pramipexole (MIRAPEX) 0.5 MG tablet   rosuvastatin (CRESTOR) 10 MG tablet   venlafaxine XR (EFFEXOR-XR) 75 MG 24 hr capsule   No current facility-administered medications for this encounter.    Shonna Chock, PA-C Surgical Short Stay/Anesthesiology [facility] Phone [phone] [facility] Phone [phone] 08/06/2023 2:35 PM

## 2023-08-06 NOTE — Telephone Encounter (Signed)
Spoke to pt, told her Dr. Royden Purl response regarding results. Pt states her symtpoms have improved from meds. Call back # [phone]

## 2023-08-06 NOTE — Progress Notes (Signed)
PCP - Dr. Mort Sawyers Cardiologist - Denies  PPM/ICD -  Denies Device Orders - N/A Rep Notified - N/A  Chest x-ray - 12-26-21 EKG - 05/06/23 Stress Test - PT STATES IT WAS DONE IN 2007 ECHO - 06/02/21 Cardiac Cath - DENIES  Sleep Study - YES CPAP - YES - unsure of settings  Fasting Blood Sugar - DENIES Checks Blood Sugar _____ times a day N/A  Last dose of GLP1 agonist-  N/A GLP1 instructions: N/A  Blood Thinner Instructions:  Aspirin Instructions:Y, pt received instructions from surgeon and last does was 08/03/23  ERAS Protcol - npo PRE-SURGERY Ensure or G2- N/A   COVID TEST- N   Anesthesia review: N  Patient denies shortness of breath, fever, cough and chest pain at PAT appointment. Pt has a chronic smokers cough. States she has had no signs or symptom pertaining to respiratory issues at this time.     All instructions explained to the patient, with a verbal understanding of the material. Patient agrees to go over the instructions while at home for a better understanding. Patient also instructed to self quarantine after being tested for COVID-19. The opportunity to ask questions was provided.

## 2023-08-06 NOTE — Anesthesia Preprocedure Evaluation (Signed)
Anesthesia Evaluation  Patient identified by MRN, date of birth, ID band Patient awake    Reviewed: Allergy & Precautions, NPO status , Patient's Chart, lab work & pertinent test results, reviewed documented beta blocker date and time   History of Anesthesia Complications Negative for: history of anesthetic complications  Airway Mallampati: III  TM Distance: >3 FB     Dental  (+) Edentulous Upper   Pulmonary asthma , sleep apnea , pneumonia, COPD, neg recent URI, Current Smoker and Patient abstained from smoking.   breath sounds clear to auscultation + decreased breath sounds      Cardiovascular (-) hypertension(-) angina + Peripheral Vascular Disease  (-) CAD, (-) Past MI and (-) CHF  Rhythm:Regular Rate:Normal     Neuro/Psych neg Seizures PSYCHIATRIC DISORDERS  Depression    ICA <40% stenosis bilaterally    GI/Hepatic hiatal hernia,GERD  ,,(+) neg Cirrhosis        Endo/Other    Renal/GU Renal disease     Musculoskeletal  (+) Arthritis ,    Abdominal   Peds  Hematology  (+) Blood dyscrasia, anemia   Anesthesia Other Findings   Reproductive/Obstetrics                             Anesthesia Physical Anesthesia Plan  ASA: 3  Anesthesia Plan: General   Post-op Pain Management:    Induction: Intravenous  PONV Risk Score and Plan: Dexamethasone and Ondansetron  Airway Management Planned: Oral ETT  Additional Equipment:   Intra-op Plan:   Post-operative Plan: Extubation in OR  Informed Consent: I have reviewed the patients History and Physical, chart, labs and discussed the procedure including the risks, benefits and alternatives for the proposed anesthesia with the patient or authorized representative who has indicated his/her understanding and acceptance.     Dental advisory given  Plan Discussed with: CRNA  Anesthesia Plan Comments: (PAT note written 08/06/2023 by  Shonna Chock, PA-C.  )       Anesthesia Quick Evaluation

## 2023-08-09 ENCOUNTER — Encounter (HOSPITAL_COMMUNITY): Payer: Self-pay | Admitting: Neurosurgery

## 2023-08-09 ENCOUNTER — Ambulatory Visit (HOSPITAL_COMMUNITY): Payer: PPO

## 2023-08-09 ENCOUNTER — Other Ambulatory Visit: Payer: Self-pay

## 2023-08-09 ENCOUNTER — Ambulatory Visit (HOSPITAL_COMMUNITY): Payer: PPO | Admitting: Vascular Surgery

## 2023-08-09 ENCOUNTER — Ambulatory Visit (HOSPITAL_COMMUNITY)
Admission: RE | Disposition: A | Discharge status: Home / Self Care | Payer: Self-pay | Source: Home / Self Care | Attending: Neurosurgery

## 2023-08-09 ENCOUNTER — Ambulatory Visit (HOSPITAL_COMMUNITY)
Admission: RE | Admit: 2023-08-09 | Discharge: 2023-08-10 | Disposition: A | Discharge status: Home / Self Care | Payer: PPO | Attending: Neurosurgery | Admitting: Neurosurgery

## 2023-08-09 ENCOUNTER — Ambulatory Visit (HOSPITAL_BASED_OUTPATIENT_CLINIC_OR_DEPARTMENT_OTHER): Payer: PPO | Admitting: Anesthesiology

## 2023-08-09 DIAGNOSIS — I129 Hypertensive chronic kidney disease with stage 1 through stage 4 chronic kidney disease, or unspecified chronic kidney disease: Secondary | ICD-10-CM

## 2023-08-09 DIAGNOSIS — N189 Chronic kidney disease, unspecified: Secondary | ICD-10-CM | POA: Diagnosis not present

## 2023-08-09 DIAGNOSIS — K219 Gastro-esophageal reflux disease without esophagitis: Secondary | ICD-10-CM | POA: Insufficient documentation

## 2023-08-09 DIAGNOSIS — Z01818 Encounter for other preprocedural examination: Secondary | ICD-10-CM | POA: Diagnosis not present

## 2023-08-09 DIAGNOSIS — Z85828 Personal history of other malignant neoplasm of skin: Secondary | ICD-10-CM | POA: Diagnosis not present

## 2023-08-09 DIAGNOSIS — G4730 Sleep apnea, unspecified: Secondary | ICD-10-CM | POA: Diagnosis not present

## 2023-08-09 DIAGNOSIS — M5416 Radiculopathy, lumbar region: Secondary | ICD-10-CM | POA: Insufficient documentation

## 2023-08-09 DIAGNOSIS — F17200 Nicotine dependence, unspecified, uncomplicated: Secondary | ICD-10-CM | POA: Diagnosis not present

## 2023-08-09 DIAGNOSIS — M48061 Spinal stenosis, lumbar region without neurogenic claudication: Secondary | ICD-10-CM | POA: Diagnosis present

## 2023-08-09 DIAGNOSIS — M2578 Osteophyte, vertebrae: Secondary | ICD-10-CM | POA: Insufficient documentation

## 2023-08-09 DIAGNOSIS — J4489 Other specified chronic obstructive pulmonary disease: Secondary | ICD-10-CM | POA: Insufficient documentation

## 2023-08-09 DIAGNOSIS — M1990 Unspecified osteoarthritis, unspecified site: Secondary | ICD-10-CM | POA: Diagnosis not present

## 2023-08-09 DIAGNOSIS — I739 Peripheral vascular disease, unspecified: Secondary | ICD-10-CM | POA: Diagnosis not present

## 2023-08-09 DIAGNOSIS — I10 Essential (primary) hypertension: Secondary | ICD-10-CM | POA: Diagnosis not present

## 2023-08-09 HISTORY — PX: LUMBAR LAMINECTOMY/DECOMPRESSION MICRODISCECTOMY: SHX5026

## 2023-08-09 SURGERY — LUMBAR LAMINECTOMY/DECOMPRESSION MICRODISCECTOMY 1 LEVEL
Anesthesia: General | Site: Back | Laterality: Right

## 2023-08-09 MED ORDER — BUPIVACAINE HCL (PF) 0.25 % IJ SOLN
INTRAMUSCULAR | Status: DC | PRN
  Administered 2023-08-09: 10 mL

## 2023-08-09 MED ORDER — PRAMIPEXOLE DIHYDROCHLORIDE 0.25 MG PO TABS
0.5000 mg | ORAL_TABLET | Freq: Two times a day (BID) | ORAL | Status: DC
  Administered 2023-08-09: 0.5 mg via ORAL
  Filled 2023-08-09: qty 2

## 2023-08-09 MED ORDER — ROCURONIUM BROMIDE 10 MG/ML (PF) SYRINGE
PREFILLED_SYRINGE | INTRAVENOUS | Status: DC | PRN
  Administered 2023-08-09: 70 mg via INTRAVENOUS

## 2023-08-09 MED ORDER — LIDOCAINE-EPINEPHRINE 1 %-1:100000 IJ SOLN
INTRAMUSCULAR | Status: DC | PRN
  Administered 2023-08-09: 10 mL via INTRADERMAL

## 2023-08-09 MED ORDER — UMECLIDINIUM BROMIDE 62.5 MCG/ACT IN AEPB
1.0000 | INHALATION_SPRAY | Freq: Every day | RESPIRATORY_TRACT | Status: DC
  Filled 2023-08-09: qty 7

## 2023-08-09 MED ORDER — PANTOPRAZOLE SODIUM 40 MG IV SOLR: 40.0000 mg | Freq: Every day | INTRAVENOUS | Status: DC

## 2023-08-09 MED ORDER — SODIUM CHLORIDE 0.9% FLUSH: 3.0000 mL | INTRAVENOUS | Status: DC | PRN

## 2023-08-09 MED ORDER — FENTANYL CITRATE (PF) 250 MCG/5ML IJ SOLN
INTRAMUSCULAR | Status: DC | PRN
  Administered 2023-08-09: 25 ug via INTRAVENOUS
  Administered 2023-08-09 (×2): 50 ug via INTRAVENOUS

## 2023-08-09 MED ORDER — CEFAZOLIN SODIUM-DEXTROSE 2-3 GM-%(50ML) IV SOLR
INTRAVENOUS | Status: DC | PRN
Start: 2023-08-09 — End: 2023-08-09
  Administered 2023-08-09: 2 g via INTRAVENOUS

## 2023-08-09 MED ORDER — MAGNESIUM 400 MG PO TABS: 400.0000 mg | ORAL_TABLET | Freq: Every day | ORAL | Status: DC

## 2023-08-09 MED ORDER — OXYCODONE HCL 5 MG/5ML PO SOLN: 5.0000 mg | Freq: Once | ORAL | Status: DC | PRN

## 2023-08-09 MED ORDER — LIDOCAINE 2% (20 MG/ML) 5 ML SYRINGE
INTRAMUSCULAR | Status: DC | PRN
  Administered 2023-08-09: 100 mg via INTRAVENOUS

## 2023-08-09 MED ORDER — SODIUM CHLORIDE 0.9% FLUSH
3.0000 mL | Freq: Two times a day (BID) | INTRAVENOUS | Status: DC
  Administered 2023-08-09: 3 mL via INTRAVENOUS

## 2023-08-09 MED ORDER — ONDANSETRON HCL 4 MG/2ML IJ SOLN: 4.0000 mg | Freq: Once | INTRAMUSCULAR | Status: DC | PRN

## 2023-08-09 MED ORDER — PROPOFOL 10 MG/ML IV BOLUS
INTRAVENOUS | Status: DC | PRN
Start: 2023-08-09 — End: 2023-08-09
  Administered 2023-08-09: 110 mg via INTRAVENOUS

## 2023-08-09 MED ORDER — BUPIVACAINE HCL (PF) 0.5 % IJ SOLN
INTRAMUSCULAR | Status: AC
  Filled 2023-08-09: qty 30

## 2023-08-09 MED ORDER — OXYCODONE HCL 5 MG PO TABS: 5.0000 mg | ORAL_TABLET | Freq: Once | ORAL | Status: DC | PRN

## 2023-08-09 MED ORDER — MENTHOL 3 MG MT LOZG
1.0000 | LOZENGE | OROMUCOSAL | Status: DC | PRN
  Administered 2023-08-10: 3 mg via ORAL
  Filled 2023-08-09: qty 9

## 2023-08-09 MED ORDER — VENLAFAXINE HCL ER 75 MG PO CP24: 75.0000 mg | ORAL_CAPSULE | Freq: Every day | ORAL | Status: DC

## 2023-08-09 MED ORDER — MAGNESIUM OXIDE -MG SUPPLEMENT 400 (240 MG) MG PO TABS
400.0000 mg | ORAL_TABLET | Freq: Every day | ORAL | Status: DC
  Administered 2023-08-09: 400 mg via ORAL
  Filled 2023-08-09: qty 1

## 2023-08-09 MED ORDER — VITAMIN D 25 MCG (1000 UNIT) PO TABS
5000.0000 [IU] | ORAL_TABLET | Freq: Every morning | ORAL | Status: DC
  Administered 2023-08-10: 5000 [IU] via ORAL
  Filled 2023-08-09 (×2): qty 5

## 2023-08-09 MED ORDER — SUGAMMADEX SODIUM 200 MG/2ML IV SOLN
INTRAVENOUS | Status: DC | PRN
  Administered 2023-08-09: 186.8 mg via INTRAVENOUS

## 2023-08-09 MED ORDER — ACETAMINOPHEN 325 MG PO TABS: 650.0000 mg | ORAL_TABLET | ORAL | Status: DC | PRN

## 2023-08-09 MED ORDER — ACETAMINOPHEN 10 MG/ML IV SOLN
INTRAVENOUS | Status: DC | PRN
Start: 2023-08-09 — End: 2023-08-09
  Administered 2023-08-09: 1000 mg via INTRAVENOUS

## 2023-08-09 MED ORDER — HYDROCODONE-ACETAMINOPHEN 5-325 MG PO TABS: 2.0000 | ORAL_TABLET | ORAL | Status: DC | PRN

## 2023-08-09 MED ORDER — ACETAMINOPHEN 650 MG RE SUPP: 650.0000 mg | RECTAL | Status: DC | PRN

## 2023-08-09 MED ORDER — BUPIVACAINE HCL (PF) 0.25 % IJ SOLN
INTRAMUSCULAR | Status: AC
  Filled 2023-08-09: qty 30

## 2023-08-09 MED ORDER — LACTATED RINGERS IV SOLN: INTRAVENOUS | Status: DC

## 2023-08-09 MED ORDER — ALBUTEROL SULFATE (2.5 MG/3ML) 0.083% IN NEBU: 2.5000 mg | INHALATION_SOLUTION | Freq: Four times a day (QID) | RESPIRATORY_TRACT | Status: DC | PRN

## 2023-08-09 MED ORDER — CHLORHEXIDINE GLUCONATE CLOTH 2 % EX PADS: 6.0000 | MEDICATED_PAD | Freq: Once | CUTANEOUS | Status: DC

## 2023-08-09 MED ORDER — ACETAMINOPHEN 10 MG/ML IV SOLN
INTRAVENOUS | Status: AC
  Filled 2023-08-09: qty 100

## 2023-08-09 MED ORDER — BUDESON-GLYCOPYRROL-FORMOTEROL 160-9-4.8 MCG/ACT IN AERO: 2.0000 | INHALATION_SPRAY | Freq: Two times a day (BID) | RESPIRATORY_TRACT | Status: DC

## 2023-08-09 MED ORDER — ONDANSETRON HCL 4 MG PO TABS: 4.0000 mg | ORAL_TABLET | Freq: Four times a day (QID) | ORAL | Status: DC | PRN

## 2023-08-09 MED ORDER — DEXAMETHASONE SODIUM PHOSPHATE 10 MG/ML IJ SOLN
INTRAMUSCULAR | Status: DC | PRN
  Administered 2023-08-09: 10 mg via INTRAVENOUS

## 2023-08-09 MED ORDER — ACETAMINOPHEN 500 MG PO TABS
1000.0000 mg | ORAL_TABLET | Freq: Four times a day (QID) | ORAL | Status: DC
  Administered 2023-08-09 – 2023-08-10 (×2): 1000 mg via ORAL
  Filled 2023-08-09 (×3): qty 2

## 2023-08-09 MED ORDER — ORAL CARE MOUTH RINSE: 15.0000 mL | Freq: Once | OROMUCOSAL | Status: AC

## 2023-08-09 MED ORDER — ROSUVASTATIN CALCIUM 5 MG PO TABS
10.0000 mg | ORAL_TABLET | Freq: Every day | ORAL | Status: DC
  Administered 2023-08-09: 10 mg via ORAL
  Filled 2023-08-09: qty 2

## 2023-08-09 MED ORDER — MOMETASONE FURO-FORMOTEROL FUM 100-5 MCG/ACT IN AERO
2.0000 | INHALATION_SPRAY | Freq: Two times a day (BID) | RESPIRATORY_TRACT | Status: DC
  Administered 2023-08-09: 2 via RESPIRATORY_TRACT
  Filled 2023-08-09: qty 8.8

## 2023-08-09 MED ORDER — FENTANYL CITRATE (PF) 100 MCG/2ML IJ SOLN
25.0000 ug | INTRAMUSCULAR | Status: DC | PRN
  Administered 2023-08-09: 25 ug via INTRAVENOUS

## 2023-08-09 MED ORDER — ONDANSETRON HCL 4 MG/2ML IJ SOLN: 4.0000 mg | Freq: Four times a day (QID) | INTRAMUSCULAR | Status: DC | PRN

## 2023-08-09 MED ORDER — PANTOPRAZOLE SODIUM 40 MG PO TBEC: 40.0000 mg | DELAYED_RELEASE_TABLET | Freq: Every day | ORAL | Status: DC

## 2023-08-09 MED ORDER — VANCOMYCIN HCL IN DEXTROSE 1-5 GM/200ML-% IV SOLN
1000.0000 mg | INTRAVENOUS | Status: AC
  Administered 2023-08-09: 1000 mg via INTRAVENOUS
  Filled 2023-08-09: qty 200

## 2023-08-09 MED ORDER — HYDROMORPHONE HCL 1 MG/ML IJ SOLN: 0.5000 mg | INTRAMUSCULAR | Status: DC | PRN

## 2023-08-09 MED ORDER — HYDROCORTISONE 1 % EX CREA: 1.0000 | TOPICAL_CREAM | Freq: Every day | CUTANEOUS | Status: DC | PRN

## 2023-08-09 MED ORDER — PROPOFOL 10 MG/ML IV BOLUS
INTRAVENOUS | Status: AC
  Filled 2023-08-09: qty 20

## 2023-08-09 MED ORDER — CEPHALEXIN 500 MG PO CAPS
500.0000 mg | ORAL_CAPSULE | Freq: Two times a day (BID) | ORAL | Status: DC
  Administered 2023-08-09: 500 mg via ORAL
  Filled 2023-08-09 (×4): qty 1

## 2023-08-09 MED ORDER — GELATIN ABSORBABLE 12-7 MM EX MISC
CUTANEOUS | Status: DC | PRN
Start: 2023-08-09 — End: 2023-08-09
  Administered 2023-08-09: 1

## 2023-08-09 MED ORDER — GABAPENTIN 300 MG PO CAPS
300.0000 mg | ORAL_CAPSULE | Freq: Three times a day (TID) | ORAL | Status: DC
  Administered 2023-08-09: 300 mg via ORAL
  Filled 2023-08-09: qty 1

## 2023-08-09 MED ORDER — FENTANYL CITRATE (PF) 100 MCG/2ML IJ SOLN
INTRAMUSCULAR | Status: AC
  Filled 2023-08-09: qty 2

## 2023-08-09 MED ORDER — ALUM & MAG HYDROXIDE-SIMETH 200-200-20 MG/5ML PO SUSP: 30.0000 mL | Freq: Four times a day (QID) | ORAL | Status: DC | PRN

## 2023-08-09 MED ORDER — SODIUM CHLORIDE 0.9 % IV SOLN: 250.0000 mL | INTRAVENOUS | Status: DC

## 2023-08-09 MED ORDER — CYCLOBENZAPRINE HCL 10 MG PO TABS: 10.0000 mg | ORAL_TABLET | Freq: Three times a day (TID) | ORAL | Status: DC | PRN

## 2023-08-09 MED ORDER — FENTANYL CITRATE (PF) 250 MCG/5ML IJ SOLN
INTRAMUSCULAR | Status: AC
  Filled 2023-08-09: qty 5

## 2023-08-09 MED ORDER — CEFAZOLIN SODIUM-DEXTROSE 2-4 GM/100ML-% IV SOLN
2.0000 g | Freq: Three times a day (TID) | INTRAVENOUS | Status: DC
  Administered 2023-08-09: 2 g via INTRAVENOUS
  Filled 2023-08-09: qty 100

## 2023-08-09 MED ORDER — CHLORHEXIDINE GLUCONATE 0.12 % MT SOLN
15.0000 mL | Freq: Once | OROMUCOSAL | Status: AC
  Administered 2023-08-09: 15 mL via OROMUCOSAL
  Filled 2023-08-09: qty 15

## 2023-08-09 MED ORDER — ACETAMINOPHEN 10 MG/ML IV SOLN: 1000.0000 mg | Freq: Once | INTRAVENOUS | Status: DC | PRN

## 2023-08-09 MED ORDER — LIDOCAINE-EPINEPHRINE 1 %-1:100000 IJ SOLN
INTRAMUSCULAR | Status: AC
  Filled 2023-08-09: qty 1

## 2023-08-09 MED ORDER — LUTEIN 20 MG PO TABS: 20.0000 mg | ORAL_TABLET | Freq: Every morning | ORAL | Status: DC

## 2023-08-09 MED ORDER — ASPIRIN 81 MG PO TBEC
81.0000 mg | DELAYED_RELEASE_TABLET | ORAL | Status: DC
  Administered 2023-08-09: 81 mg via ORAL
  Filled 2023-08-09: qty 1

## 2023-08-09 MED ORDER — THROMBIN 5000 UNITS EX SOLR
CUTANEOUS | Status: AC
  Filled 2023-08-09: qty 10000

## 2023-08-09 MED ORDER — THROMBIN 5000 UNITS EX SOLR
CUTANEOUS | Status: DC | PRN
  Administered 2023-08-09: 5000 [IU] via TOPICAL

## 2023-08-09 MED ORDER — PHENOL 1.4 % MT LIQD: 1.0000 | OROMUCOSAL | Status: DC | PRN

## 2023-08-09 MED ORDER — 0.9 % SODIUM CHLORIDE (POUR BTL) OPTIME
TOPICAL | Status: DC | PRN
  Administered 2023-08-09: 1000 mL

## 2023-08-09 MED ORDER — PHENYLEPHRINE HCL-NACL 20-0.9 MG/250ML-% IV SOLN
INTRAVENOUS | Status: DC | PRN
  Administered 2023-08-09: 40 ug/min via INTRAVENOUS

## 2023-08-09 MED ORDER — ONDANSETRON HCL 4 MG/2ML IJ SOLN
INTRAMUSCULAR | Status: DC | PRN
  Administered 2023-08-09: 4 mg via INTRAVENOUS

## 2023-08-09 SURGICAL SUPPLY — 49 items
ADH SKN CLS APL DERMABOND .7 (GAUZE/BANDAGES/DRESSINGS) ×1
APL SKNCLS STERI-STRIP NONHPOA (GAUZE/BANDAGES/DRESSINGS) ×1
BAG COUNTER SPONGE SURGICOUNT (BAG) ×1 IMPLANT
BAG SPNG CNTER NS LX DISP (BAG) ×2
BENZOIN TINCTURE PRP APPL 2/3 (GAUZE/BANDAGES/DRESSINGS) ×1 IMPLANT
BLADE CLIPPER SURG (BLADE) IMPLANT
BLADE SURG 11 STRL SS (BLADE) ×1 IMPLANT
BUR CUTTER 7.0 ROUND (BURR) ×1 IMPLANT
BUR MATCHSTICK NEURO 3.0 LAGG (BURR) ×1 IMPLANT
CANISTER SUCT 3000ML PPV (MISCELLANEOUS) ×1 IMPLANT
DERMABOND ADVANCED .7 DNX12 (GAUZE/BANDAGES/DRESSINGS) ×1 IMPLANT
DRAPE HALF SHEET 40X57 (DRAPES) IMPLANT
DRAPE LAPAROTOMY 100X72X124 (DRAPES) ×1 IMPLANT
DRAPE MICROSCOPE SLANT 54X150 (MISCELLANEOUS) ×1 IMPLANT
DRAPE SURG 17X23 STRL (DRAPES) ×1 IMPLANT
DRSG OPSITE POSTOP 4X6 (GAUZE/BANDAGES/DRESSINGS) IMPLANT
DURAPREP 26ML APPLICATOR (WOUND CARE) ×1 IMPLANT
ELECT REM PT RETURN 9FT ADLT (ELECTROSURGICAL) ×1
ELECTRODE REM PT RTRN 9FT ADLT (ELECTROSURGICAL) ×1 IMPLANT
GAUZE 4X4 16PLY ~~LOC~~+RFID DBL (SPONGE) IMPLANT
GAUZE SPONGE 4X4 12PLY STRL (GAUZE/BANDAGES/DRESSINGS) ×1 IMPLANT
GLOVE BIO SURGEON STRL SZ7 (GLOVE) IMPLANT
GLOVE BIO SURGEON STRL SZ8 (GLOVE) ×1 IMPLANT
GLOVE BIOGEL PI IND STRL 7.0 (GLOVE) IMPLANT
GLOVE EXAM NITRILE XL STR (GLOVE) IMPLANT
GLOVE INDICATOR 8.5 STRL (GLOVE) ×2 IMPLANT
GOWN STRL REUS W/ TWL LRG LVL3 (GOWN DISPOSABLE) ×1 IMPLANT
GOWN STRL REUS W/ TWL XL LVL3 (GOWN DISPOSABLE) ×2 IMPLANT
GOWN STRL REUS W/TWL 2XL LVL3 (GOWN DISPOSABLE) IMPLANT
GOWN STRL REUS W/TWL LRG LVL3 (GOWN DISPOSABLE) ×1
GOWN STRL REUS W/TWL XL LVL3 (GOWN DISPOSABLE) ×2
KIT BASIN OR (CUSTOM PROCEDURE TRAY) ×1 IMPLANT
KIT TURNOVER KIT B (KITS) ×1 IMPLANT
NDL HYPO 22X1.5 SAFETY MO (MISCELLANEOUS) ×1 IMPLANT
NDL SPNL 22GX3.5 QUINCKE BK (NEEDLE) ×1 IMPLANT
NEEDLE HYPO 22X1.5 SAFETY MO (MISCELLANEOUS) ×1 IMPLANT
NEEDLE SPNL 22GX3.5 QUINCKE BK (NEEDLE) ×1 IMPLANT
NS IRRIG 1000ML POUR BTL (IV SOLUTION) ×1 IMPLANT
PACK LAMINECTOMY NEURO (CUSTOM PROCEDURE TRAY) ×1 IMPLANT
SPIKE FLUID TRANSFER (MISCELLANEOUS) ×1 IMPLANT
SPONGE SURGIFOAM ABS GEL SZ50 (HEMOSTASIS) ×1 IMPLANT
STRIP CLOSURE SKIN 1/2X4 (GAUZE/BANDAGES/DRESSINGS) ×1 IMPLANT
SUT VIC AB 0 CT1 18XCR BRD8 (SUTURE) ×1 IMPLANT
SUT VIC AB 0 CT1 8-18 (SUTURE) ×1
SUT VIC AB 2-0 CT1 18 (SUTURE) ×1 IMPLANT
SUT VICRYL 4-0 PS2 18IN ABS (SUTURE) ×1 IMPLANT
TOWEL GREEN STERILE (TOWEL DISPOSABLE) ×1 IMPLANT
TOWEL GREEN STERILE FF (TOWEL DISPOSABLE) ×1 IMPLANT
WATER STERILE IRR 1000ML POUR (IV SOLUTION) ×1 IMPLANT

## 2023-08-09 NOTE — H&P (Signed)
Alicia [name] is an 77 y.o. female.   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  I extensively reviewed the risks and benefits of the operation with the patient as well as perioperative course expectations of outcome and alternatives to surgery and she understood and agreed to proceed forward.  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   R mid forearm, [facility]    Past Surgical History:  Procedure Laterality Date   APPENDECTOMY  10/2016   BICEPT TENODESIS Left 05/18/2023   Procedure: BICEPS TENODESIS;  Surgeon: Cammy Copa, MD;  Location: [facility] OR;  Service: Orthopedics;  Laterality: Left;   BREAST BIOPSY Right 2022   CELIAC ARTERY STENT     CHOLECYSTECTOMY  10/2016   COLONOSCOPY     12/2022   ENTEROSCOPY N/A 12/17/2022   Procedure: ENTEROSCOPY;  Surgeon: Imogene Burn, MD;  Location: [facility] ENDOSCOPY;  Service: Gastroenterology;  Laterality: N/A;  small bowel enteroscopy   EYE SURGERY  Bilateral 2017   cataracts removed   HOT HEMOSTASIS  12/17/2022   Procedure: HOT HEMOSTASIS (ARGON PLASMA COAGULATION/BICAP);  Surgeon: Imogene Burn, MD;  Location: [facility];  Service: Gastroenterology;;   LUMBAR LAMINECTOMY/DECOMPRESSION MICRODISCECTOMY Left 07/21/2021   Procedure: Laminectomy and Foraminotomy - L4-L5 - left;  Surgeon: Donalee Citrin, MD;  Location: [facility] OR;  Service: Neurosurgery;  Laterality: Left;  3C   PAROTID GLAND TUMOR EXCISION Left 2003   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   Procedure: PERIPHERAL VASCULAR INTERVENTION;  Surgeon: Maeola Harman, MD;  Location: [facility] INVASIVE CV LAB;  Service: Cardiovascular;;  celiac   REVERSE SHOULDER ARTHROPLASTY Left 05/18/2023   Procedure: LEFT REVERSE SHOULDER ARTHROPLASTY;  Surgeon: Cammy Copa, MD;  Location: [facility] OR;  Service: Orthopedics;  Laterality: Left;   TOTAL SHOULDER ARTHROPLASTY Right 08/06/2022   Procedure: RIGHT REVERSE SHOULDER ARTHROPLASTY;  Surgeon: Cammy Copa, MD;  Location: [facility] OR;  Service: Orthopedics;  Laterality: Right;   VISCERAL ANGIOGRAPHY N/A 05/18/2022   Procedure: VISCERAL ANGIOGRAPHY;  Surgeon: Maeola Harman, MD;  Location: [facility] INVASIVE CV LAB;  Service: Cardiovascular;  Laterality: N/A;    Family History  Problem Relation Age of Onset   Heart disease Mother    Cerebral aneurysm Father    Social History:  reports that she has been smoking cigarettes. She has a 62 pack-year smoking history. She has been  exposed to tobacco smoke. She has never used smokeless tobacco. She reports that she does not currently use alcohol. She reports that she does not use drugs.  Allergies:  Allergies  Allergen Reactions   Amoxicillin Other (See Comments)    Yeast infections (in high doses)    Medications Prior to Admission  Medication Sig Dispense Refill   acetaminophen (TYLENOL) 500 MG tablet Take 1,000 mg by mouth every 6 (six) hours.     albuterol (VENTOLIN HFA) 108  (90 Base) MCG/ACT inhaler INHALE 1-2 PUFFS INTO THE LUNGS 4 (FOUR) TIMES DAILY AS NEEDED FOR SHORTNESS OF BREATH OR WHEEZING. 18 each 0   aspirin EC 81 MG tablet Take 81 mg by mouth every other day. Swallow whole.     BREZTRI AEROSPHERE 160-9-4.8 MCG/ACT AERO INHALE 2 PUFFS INTO THE LUNGS TWICE DAILY. 10.7 each 11   cephALEXin (KEFLEX) 500 MG capsule Take 1 capsule (500 mg total) by mouth 2 (two) times daily. 14 capsule 0   Cholecalciferol (VITAMIN D3) 125 MCG (5000 UT) CAPS Take 5,000 Units by mouth in the morning.     gabapentin (NEURONTIN) 300 MG capsule TAKE 1 CAPSULE BY MOUTH THREE TIMES A DAY (Patient taking differently: Take 300 mg by mouth 3 (three) times daily.) 270 capsule 1   hydrocortisone 2.5 % cream Apply to rash on face and under breasts once to twice daily as directed. (Patient taking differently: Apply 1 Application topically daily as needed (for dry nose).) 30 g 2   Lutein 20 MG TABS Take 20 mg by mouth in the morning.     Magnesium 400 MG TABS Take 400 mg by mouth at bedtime. Magnesium Complex     omeprazole (PRILOSEC) 40 MG capsule TAKE 1 CAPSULE (40 MG TOTAL) BY MOUTH DAILY. 90 capsule 1   Polyvinyl Alcohol-Povidone (REFRESH OP) Place 1 drop into both eyes as needed (dry eye).     pramipexole (MIRAPEX) 0.5 MG tablet TAKE 1 TABLET BY MOUTH TWICE A DAY 180 tablet 1   rosuvastatin (CRESTOR) 10 MG tablet TAKE 1 TABLET BY MOUTH EVERY DAY 90 tablet 3   venlafaxine XR (EFFEXOR-XR) 75 MG 24 hr capsule Take 1 capsule (75 mg total) by mouth daily. 90 capsule 3    No results found for this or any previous visit (from the past 48 hour(s)). No results found.  Review of Systems  Musculoskeletal:  Positive for back pain.  Neurological:  Positive for weakness and numbness.    Blood pressure (!) 154/80, pulse 83, temperature 97.7 F (36.5 C), temperature source Oral, resp. rate 16, height 5' (1.524 m), weight 93.4 kg, SpO2 94%. Physical Exam HENT:     Head: Normocephalic.     Right  Ear: Tympanic membrane normal.     Nose: Nose normal.     Mouth/Throat:     Mouth: Mucous membranes are moist.  Eyes:     Pupils: Pupils are equal, round, and reactive to light.  Cardiovascular:     Rate and Rhythm: Normal rate.  Pulmonary:     Effort: Pulmonary effort is normal.  Abdominal:     General: Abdomen is flat.  Musculoskeletal:        General: Normal range of motion.  Neurological:     Mental Status: She is alert.     Comments: Pain limited weakness difficult to assess although historically she is about 4+ out of 5 right proximal lower extremity otherwise 5 out of 5      Assessment/Plan 77 year old presents  for right-sided L2-3 decompressive laminectomy and foraminotomies  Mariam Dollar, MD 08/09/2023, 12:48 PM

## 2023-08-09 NOTE — Transfer of Care (Signed)
Immediate Anesthesia Transfer of Care Note  Patient: Alicia [name]  Procedure(s) Performed: Sublaminar decompression - Lumbar Two-Lumbar Three - right (Right: Back)  Patient Location: PACU  Anesthesia Type:General  Level of Consciousness: awake, alert , and oriented  Airway & Oxygen Therapy: Patient Spontanous Breathing and Patient connected to face mask oxygen  Post-op Assessment: Report given to RN and Post -op Vital signs reviewed and stable  Post vital signs: Reviewed and stable  Last Vitals:  Vitals Value Taken Time  BP 172/77 08/09/23 1736  Temp 36.4 C 08/09/23 1736  Pulse 86 08/09/23 1743  Resp 20 08/09/23 1743  SpO2 88 % 08/09/23 1743  Vitals shown include unfiled device data.  Last Pain:  Vitals:   08/09/23 1226  TempSrc:   PainSc: 9       Patients Stated Pain Goal: 0 (08/09/23 1226)  Complications: No notable events documented.

## 2023-08-09 NOTE — Op Note (Signed)
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  Postoperative diagnosis: Same.  Procedure: Decompressive lumbar laminectomy L2-3 with partial medial facetectomy and foraminotomy utilizing the microscope for foraminotomies and the partial facetectomy.  Surgeon: Donalee Citrin.  System: 80 pool.  Anesthesia: General.  EBL: Minimal.  HPI: 77 year old female with back pain and primarily right-sided L3 radicular pain workup revealed severe lumbar spinal stenosis on the right with lateral recess stenosis at L2-3 due to patient's progression of clinical syndrome imaging findings of a conservative treatment I recommend decompressive laminotomy with foraminotomy and partial facetectomy at L2-3 on the right.  I extensively reviewed the risks and benefits of the operation with patient as well as perioperative course expectations of outcome and alternatives of surgery and she understood and agreed with the seed forward.  Procedure: Patient was brought into the OR and induced under general anesthesia positioned prone the Wilson frame her back was prepped and draped in routine sterile fashion.  Preoperative x-ray localized the appropriate level.  After infiltration of 10 cc lidocaine with epi midline incision was made and Bovie electrocautery was used to take down subcutaneous tissue and subperiosteal dissection carried lamina of L2 and L3 confirmed by interoperative x-ray.  So then the inferior half of the lamina of L2 was drilled down and thinned out as well as the medial facet and superior aspect of lamina of L3.  Laminotomy was begun with a 2 and 3 mm Kerrison punch.  There was marked hourglass compression thecal sac was difficult initially identified the dura then we draped the operating microscope and under microscopic lamination teasing the ligamentum flavum off the dura identified the dura and then proceeded to thin out and drill away the medial facet and superior aspect of the L3  lamina.  Removed a large spur causing severe thecal sac compression and identified the L3 pedicle and unroofed the L3 foramen then marching superiorly I removed extensive mount of osteophyte coming off the medial facet right overlying the disc base.  And I marched and decompressed just to just above the disc base.  At the end of decompression there is no further stenosis either centrally or foraminally I was easily able to pass a coronary dilator out both the L2 and L3 foramina and palpated the disc base felt not to be significantly bulging or herniated and then the wound was copiously irrigated meticulous hemostasis was maintained my assistant was present during critical parts of the decompression and closure.  Laid Gelfoam and top of the dura the muscle and fascia approximate layers with active Vicryl and skin was closed running 4 subcuticular.  Dermabond benzoin Steri-Strips and sterile dressing was applied patient recovery in stable condition.  At the end of the case all needle count sponge counts were correct.

## 2023-08-09 NOTE — Anesthesia Procedure Notes (Signed)
Procedure Name: Intubation Date/Time: 08/09/2023 4:05 PM  Performed by: Loleta Nyzier Boivin, CRNAPre-anesthesia Checklist: Patient identified, Patient being monitored, Timeout performed, Emergency Drugs available and Suction available Patient Re-evaluated:Patient Re-evaluated prior to induction Oxygen Delivery Method: Circle system utilized Preoxygenation: Pre-oxygenation with 100% oxygen Induction Type: IV induction Ventilation: Mask ventilation without difficulty Laryngoscope Size: Mac and 3 Grade View: Grade I Tube type: Oral Tube size: 7.0 mm Number of attempts: 1 Airway Equipment and Method: Stylet Placement Confirmation: ETT inserted through vocal cords under direct vision, positive ETCO2 and breath sounds checked- equal and bilateral Secured at: 22 cm Tube secured with: Tape Dental Injury: Teeth and Oropharynx as per pre-operative assessment

## 2023-08-09 NOTE — Anesthesia Postprocedure Evaluation (Signed)
Anesthesia Post Note  Patient: Alicia [name]  Procedure(s) Performed: Sublaminar decompression - Lumbar Two-Lumbar Three - right (Right: Back)     Patient location during evaluation: PACU Anesthesia Type: General Level of consciousness: awake and alert Pain management: pain level controlled Vital Signs Assessment: post-procedure vital signs reviewed and stable Respiratory status: spontaneous breathing, nonlabored ventilation, respiratory function stable and patient connected to nasal cannula oxygen Cardiovascular status: blood pressure returned to baseline and stable Postop Assessment: no apparent nausea or vomiting Anesthetic complications: no   No notable events documented.  Last Vitals:  Vitals:   08/09/23 1221 08/09/23 1736  BP: (!) 154/80 (!) 172/77  Pulse: 83 86  Resp: 16 19  Temp: 36.5 C 36.4 C  SpO2: 94% 96%    Last Pain:  Vitals:   08/09/23 1736  TempSrc:   PainSc: 0-No pain                 Mariann Barter

## 2023-08-10 ENCOUNTER — Encounter (HOSPITAL_COMMUNITY): Payer: Self-pay | Admitting: Neurosurgery

## 2023-08-10 DIAGNOSIS — M48061 Spinal stenosis, lumbar region without neurogenic claudication: Secondary | ICD-10-CM | POA: Diagnosis not present

## 2023-08-10 MED FILL — Thrombin For Soln 5000 Unit: CUTANEOUS | Qty: 5000 | Status: AC

## 2023-08-10 NOTE — Evaluation (Signed)
Occupational Therapy Evaluation Patient Details Name: Alicia [name] MRN: [id number] DOB: 05/20/1946 Today's Date: 08/10/2023   History of Present Illness Pt is a 77 y/o female presenting on 9/30 for same day for R sided decompressive lumbar laminectomy L2-3. PMH includes: anemia, arthritis, macular degeneration, mouth cancer, PVD, pneumonia, sleep apnea, prior back surgery, B shoulder surgery.   Clinical Impression   Patient admitted for above and presents with problem list below.  PTA pt was independent using cane for ADLs, functional mobility and driving. Patient was educated on back precautions, ADL compensatory techniques, AE/DME, mobility progression, safety and recommendations.  Today, pt demonstrated ability to complete transfers using [facility] with modified independence, functional mobility using SPC with modified independence, and ADLs with modified independence.  At discharge, pt will have support from niece as needed.  Based on performance today, no further OT needs identified.  OT will sign off.        If plan is discharge home, recommend the following: Assist for transportation;Assistance with cooking/housework    Functional Status Assessment     Equipment Recommendations  None recommended by OT    Recommendations for Other Services       Precautions / Restrictions Precautions Precautions: Back Precaution Booklet Issued: Yes (comment) Precaution Comments: reviewed with pt Required Braces or Orthoses:  (no brace needed) Restrictions Weight Bearing Restrictions: No      Mobility Bed Mobility               General bed mobility comments: pt OOB in chair, able to voice technique without cueing    Transfers Overall transfer level: Modified independent Equipment used: Straight cane               General transfer comment: no assist required      Balance Overall balance assessment: Needs assistance Sitting-balance support: No upper extremity  supported, Feet supported Sitting balance-Leahy Scale: Good     Standing balance support: No upper extremity supported, During functional activity, Single extremity supported Standing balance-Leahy Scale: Good                             ADL either performed or assessed with clinical judgement   ADL Overall ADL's : Modified independent                                       General ADL Comments: reviewed techniques for back precaution adherance and safety, no assist required     Vision   Vision Assessment?: No apparent visual deficits     Perception         Praxis         Pertinent Vitals/Pain Pain Assessment Pain Assessment: Faces Faces Pain Scale: Hurts little more Pain Location: back-incisional Pain Descriptors / Indicators: Discomfort, Operative site guarding Pain Intervention(s): Monitored during session, Limited activity within patient's tolerance, Repositioned     Extremity/Trunk Assessment Upper Extremity Assessment Upper Extremity Assessment: Overall WFL for tasks assessed   Lower Extremity Assessment Lower Extremity Assessment: Overall WFL for tasks assessed   Cervical / Trunk Assessment Cervical / Trunk Assessment: Back Surgery   Communication Communication Communication: No apparent difficulties   Cognition Arousal: Alert Behavior During Therapy: WFL for tasks assessed/performed Overall Cognitive Status: Within Functional Limits for tasks assessed  General Comments       Exercises     Shoulder Instructions      Home Living Family/patient expects to be discharged to:: Private residence Living Arrangements: Other relatives (niece) Available Help at Discharge: Family Type of Home: House Home Access: Stairs to enter Secretary/administrator of Steps: 1 Entrance Stairs-Rails: None Home Layout: One level     Bathroom Shower/Tub: Tub/shower unit;Walk-in shower    Bathroom Toilet: Standard     Home Equipment: Rollator (4 wheels);Cane - single point;Shower seat;Grab bars - tub/shower   Additional Comments: pts typically uses tub shower, but plans to use walk in shower with seat      Prior Functioning/Environment Prior Level of Function : Independent/Modified Independent             Mobility Comments: cane for mobility ADLs Comments: independent ADLs and iADLs        OT Problem List: Decreased strength;Decreased activity tolerance;Pain      OT Treatment/Interventions:      OT Goals(Current goals can be found in the care plan section) Acute Rehab OT Goals Patient Stated Goal: home OT Goal Formulation: With patient  OT Frequency:      Co-evaluation              AM-PAC OT "6 Clicks" Daily Activity     Outcome Measure Help from another person eating meals?: None Help from another person taking care of personal grooming?: None Help from another person toileting, which includes using toliet, bedpan, or urinal?: None Help from another person bathing (including washing, rinsing, drying)?: None Help from another person to put on and taking off regular upper body clothing?: None Help from another person to put on and taking off regular lower body clothing?: None 6 Click Score: 24   End of Session Equipment Utilized During Treatment: Other (comment) (cane) Nurse Communication: Mobility status  Activity Tolerance: Patient tolerated treatment well Patient left: in chair;with call bell/phone within reach  OT Visit Diagnosis: Unsteadiness on feet (R26.81);Pain Pain - part of body:  (back)                Time: 1478-2956 OT Time Calculation (min): 15 min Charges:  OT General Charges $OT Visit: 1 Visit OT Evaluation $OT Eval Low Complexity: 1 Low  Barry Brunner, OT Acute Rehabilitation Services Office [phone]   Chancy Milroy 08/10/2023, 9:28 AM

## 2023-08-10 NOTE — Progress Notes (Signed)
Patient alert and oriented, mae's well, voiding adequate amount of urine, swallowing without difficulty, no c/o pain at time of discharge. Patient discharged home with family. Script and discharged instructions given to patient. Patient and family stated understanding of instructions given. Patient has an appointment with Dr. Cram 

## 2023-08-10 NOTE — Discharge Summary (Signed)
Physician Discharge Summary  Patient ID: Alicia [name] MRN: [id number] DOB/AGE: [birth year] 77 y.o. Estimated body mass index is 40.23 kg/m as calculated from the following:   Height as of this encounter: 5' (1.524 m).   Weight as of this encounter: 93.4 kg.   Admit date: 08/09/2023 Discharge date: 08/10/2023  Admission Diagnoses: Lumbar spinal stenosis L2-3  Discharge Diagnoses: Same Principal Problem:   Spinal stenosis of lumbar region   Discharged Condition: good  Hospital Course: Patient admitted to hospital underwent decompressive laminectomy L2-3 on the right postoperatively patient did very well come in the floor on the floor was ambulating and voiding spontaneously tolerating regular diet and stable for discharge home.  Consults: Significant Diagnostic Studies: Treatments: Decompressive laminectomy L2-3 on the right Discharge Exam: Blood pressure 127/73, pulse 83, temperature 97.9 F (36.6 C), temperature source Oral, resp. rate 18, height 5' (1.524 m), weight 93.4 kg, SpO2 96%. Strength 5 out of 5 and clean dry and intact  Disposition: Home   Allergies as of 08/10/2023       Reactions   Amoxicillin Other (See Comments)   Yeast infections (in high doses)        Medication List     TAKE these medications    acetaminophen 500 MG tablet Commonly known as: TYLENOL Take 1,000 mg by mouth every 6 (six) hours.   albuterol 108 (90 Base) MCG/ACT inhaler Commonly known as: VENTOLIN HFA INHALE 1-2 PUFFS INTO THE LUNGS 4 (FOUR) TIMES DAILY AS NEEDED FOR SHORTNESS OF BREATH OR WHEEZING.   aspirin EC 81 MG tablet Take 81 mg by mouth every other day. Swallow whole.   Breztri Aerosphere 160-9-4.8 MCG/ACT Aero Generic drug: Budeson-Glycopyrrol-Formoterol INHALE 2 PUFFS INTO THE LUNGS TWICE DAILY.   cephALEXin 500 MG capsule Commonly known as: KEFLEX Take 1 capsule (500 mg total) by mouth 2 (two) times daily.   gabapentin 300 MG capsule Commonly known as:  NEURONTIN TAKE 1 CAPSULE BY MOUTH THREE TIMES A DAY What changed: See the new instructions.   hydrocortisone 2.5 % cream Apply to rash on face and under breasts once to twice daily as directed. What changed:  how much to take how to take this when to take this reasons to take this additional instructions   Lutein 20 MG Tabs Take 20 mg by mouth in the morning.   Magnesium 400 MG Tabs Take 400 mg by mouth at bedtime. Magnesium Complex   omeprazole 40 MG capsule Commonly known as: PRILOSEC TAKE 1 CAPSULE (40 MG TOTAL) BY MOUTH DAILY.   pramipexole 0.5 MG tablet Commonly known as: MIRAPEX TAKE 1 TABLET BY MOUTH TWICE A DAY   REFRESH OP Place 1 drop into both eyes as needed (dry eye).   rosuvastatin 10 MG tablet Commonly known as: CRESTOR TAKE 1 TABLET BY MOUTH EVERY DAY   venlafaxine XR 75 MG 24 hr capsule Commonly known as: EFFEXOR-XR Take 1 capsule (75 mg total) by mouth daily.   Vitamin D3 125 MCG (5000 UT) Caps Take 5,000 Units by mouth in the morning.         Signed: Mariam Dollar 08/10/2023, 7:27 AM

## 2023-08-10 NOTE — Discharge Instructions (Addendum)
Wound Care Keep incision covered and dry until post op day 3. You may remove the Honeycomb dressing on post op day 3. Leave steri-strips on back.  They will fall off by themselves. Do not put any creams, lotions, or ointments on incision. You are fine to shower. Let water run over incision and pat dry.  Activity Activity Walk each and every day, increasing distance each day. No lifting greater than 8 lbs.  No lifting no bending no twisting no driving or riding a car unless coming back and forth to see the doctor.   Diet Resume your normal diet.   Return to Work Will be discussed at your follow up appointment.  Call Your Doctor If Any of These Occur Redness, drainage, or swelling at the wound.  Temperature greater than 101 degrees. Severe pain not relieved by pain medication. Incision starts to come apart.  Follow Up Appt Call [phone] if you have one or any problem.

## 2023-08-12 ENCOUNTER — Ambulatory Visit: Payer: PPO | Admitting: Internal Medicine

## 2023-08-12 ENCOUNTER — Encounter (HOSPITAL_COMMUNITY): Payer: Self-pay

## 2023-08-12 ENCOUNTER — Emergency Department (HOSPITAL_COMMUNITY)
Admission: EM | Admit: 2023-08-12 | Discharge: 2023-08-12 | Disposition: A | Discharge status: Home / Self Care | Payer: PPO | Attending: Emergency Medicine | Admitting: Emergency Medicine

## 2023-08-12 ENCOUNTER — Other Ambulatory Visit: Payer: Self-pay

## 2023-08-12 DIAGNOSIS — M79604 Pain in right leg: Secondary | ICD-10-CM | POA: Diagnosis not present

## 2023-08-12 DIAGNOSIS — Z7982 Long term (current) use of aspirin: Secondary | ICD-10-CM | POA: Diagnosis not present

## 2023-08-12 DIAGNOSIS — M545 Low back pain, unspecified: Secondary | ICD-10-CM | POA: Diagnosis not present

## 2023-08-12 DIAGNOSIS — J45909 Unspecified asthma, uncomplicated: Secondary | ICD-10-CM | POA: Diagnosis not present

## 2023-08-12 DIAGNOSIS — Z85819 Personal history of malignant neoplasm of unspecified site of lip, oral cavity, and pharynx: Secondary | ICD-10-CM | POA: Insufficient documentation

## 2023-08-12 DIAGNOSIS — I10 Essential (primary) hypertension: Secondary | ICD-10-CM | POA: Diagnosis not present

## 2023-08-12 DIAGNOSIS — R6889 Other general symptoms and signs: Secondary | ICD-10-CM | POA: Diagnosis not present

## 2023-08-12 DIAGNOSIS — M549 Dorsalgia, unspecified: Secondary | ICD-10-CM | POA: Diagnosis not present

## 2023-08-12 DIAGNOSIS — G8918 Other acute postprocedural pain: Secondary | ICD-10-CM | POA: Diagnosis not present

## 2023-08-12 MED ORDER — OXYCODONE-ACETAMINOPHEN 5-325 MG PO TABS
0.5000 | ORAL_TABLET | Freq: Three times a day (TID) | ORAL | 0 refills | Status: DC | PRN
Start: 2023-08-12 — End: 2023-10-06

## 2023-08-12 MED ORDER — KETOROLAC TROMETHAMINE 15 MG/ML IJ SOLN
15.0000 mg | Freq: Once | INTRAMUSCULAR | Status: AC
  Administered 2023-08-12: 15 mg via INTRAVENOUS
  Filled 2023-08-12: qty 1

## 2023-08-12 MED ORDER — FENTANYL CITRATE PF 50 MCG/ML IJ SOSY
50.0000 ug | PREFILLED_SYRINGE | Freq: Once | INTRAMUSCULAR | Status: AC
  Administered 2023-08-12: 50 ug via INTRAVENOUS
  Filled 2023-08-12: qty 1

## 2023-08-12 NOTE — ED Provider Notes (Signed)
[place] EMERGENCY DEPARTMENT AT [facility] Provider Note   CSN: [id number] Arrival date & time: 08/12/23  1191     History  Chief Complaint  Patient presents with   Back Pain    Alicia [name] is a 77 y.o. female.   Back Pain Patient presents with low back pain.  Had L2-3 laminectomy by Dr. Wynetta Emery on Monday and discharged Tuesday.  Had had some pain since.  Had been taking Tylenol without relief.  Had some pain going down the right leg.  States it was there preop but now also involves posterior leg also.  States pain has gotten worse.  States difficulty walking due to the pain.  States she called the surgery office at 6 this morning and there was no answer.    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 Authorizing Provider  oxyCODONE-acetaminophen (PERCOCET/ROXICET) 5-325 MG tablet Take 0.5-1 tablets by mouth every 8 (eight) hours as needed for severe pain. 08/12/23  Yes Benjiman Core, MD  acetaminophen (TYLENOL) 500 MG tablet Take 1,000 mg by mouth every 6 (six) hours.    [provider]  albuterol (VENTOLIN HFA) 108 (90  Base) MCG/ACT inhaler INHALE 1-2 PUFFS INTO THE LUNGS 4 (FOUR) TIMES DAILY AS NEEDED FOR SHORTNESS OF BREATH OR WHEEZING. 07/05/23   Mort Sawyers, FNP  aspirin EC 81 MG tablet Take 81 mg by mouth every other day. Swallow whole.    [provider]  BREZTRI AEROSPHERE 160-9-4.8 MCG/ACT AERO INHALE 2 PUFFS INTO THE LUNGS TWICE DAILY. 03/05/23   Nyoka Cowden, MD  cephALEXin (KEFLEX) 500 MG capsule Take 1 capsule (500 mg total) by mouth 2 (two) times daily. 08/02/23   Tower, Audrie Gallus, MD  Cholecalciferol (VITAMIN D3) 125 MCG (5000 UT) CAPS Take 5,000 Units by mouth in the morning.    [provider]  gabapentin (NEURONTIN) 300 MG capsule TAKE 1 CAPSULE BY MOUTH THREE TIMES A DAY Patient taking differently: Take 300 mg by mouth 3 (three) times daily. 02/06/23   [name], Joycie Peek, PA-C  hydrocortisone 2.5 % cream Apply to rash on face and under breasts once to twice daily as directed. Patient taking differently: Apply 1 Application topically daily as needed (for dry nose). 01/19/23   Willeen Niece, MD  Lutein 20 MG TABS Take 20 mg by mouth in the morning.    [provider]  Magnesium 400 MG TABS Take 400 mg by mouth at bedtime. Magnesium Complex  [provider]  omeprazole (PRILOSEC) 40 MG capsule TAKE 1 CAPSULE (40 MG TOTAL) BY MOUTH DAILY. 01/18/23   Imogene Burn, MD  Polyvinyl Alcohol-Povidone (REFRESH OP) Place 1 drop into both eyes as needed (dry eye).    [provider]  pramipexole (MIRAPEX) 0.5 MG tablet TAKE 1 TABLET BY MOUTH TWICE A DAY 04/07/23   Mort Sawyers, FNP  rosuvastatin (CRESTOR) 10 MG tablet TAKE 1 TABLET BY MOUTH EVERY DAY 06/02/23   Mort Sawyers, FNP  venlafaxine XR (EFFEXOR-XR) 75 MG 24 hr capsule Take 1 capsule (75 mg total) by mouth daily. 06/11/23   Mort Sawyers, FNP      Allergies    Amoxicillin    Review of Systems   Review of Systems  Musculoskeletal:  Positive for back pain.    Physical Exam Updated Vital Signs BP  (!) 147/69   Pulse (!) 59   Temp 97.7 F (36.5 C) (Oral)   Resp 15   Ht 5' (1.524 m)   Wt 93.4 kg   SpO2 98%   BMI 40.23 kg/m  Physical Exam Vitals and nursing note reviewed.  Musculoskeletal:     Comments: Dressing along low back in place.  Some tenderness.  Some dried blood.  No erythema.  Some tenderness down the right leg.  Sitting on her side in the bed.  Neurological:     Mental Status: She is alert and oriented to person, place, and time.     ED Results / Procedures / Treatments   Labs (all labs ordered are listed, but only abnormal results are displayed) Labs Reviewed - No data to display  EKG None  Radiology No results found.  Procedures Procedures    Medications Ordered in ED Medications  ketorolac (TORADOL) 15 MG/ML injection 15 mg (15 mg Intravenous Given 08/12/23 0853)  fentaNYL (SUBLIMAZE) injection 50 mcg (50 mcg Intravenous Given 08/12/23 5784)    ED Course/ Medical Decision Making/ A&P                                 Medical Decision Making Risk Prescription drug management.   Patient with low back pain postsurgery 2 days ago.  Has been taking Tylenol without relief.  Differential diagnosis includes postoperative pain, hematoma, infection felt less likely.  For now we will treat with pain medicine.  Has been given 50 mcg of fentanyl and Toradol.  Now more sleepy.  Reportedly told nurse pain is now 1 out of 10.  Patient's niece is now at bedside  Patient more awake.  Feeling better.  Will increase level of pain control at home.  Will give oxycodone but can take half a pill.  Instructed on care with the medicine.  Will discharge home with neurosurgery follow-up.          Final Clinical Impression(s) / ED Diagnoses Final diagnoses:  Postoperative pain after spinal surgery    Rx / DC Orders ED Discharge Orders          Ordered    oxyCODONE-acetaminophen (PERCOCET/ROXICET) 5-325 MG tablet  Every 8 hours PRN        08/12/23 1152               Benjiman Core, MD 08/12/23 1153

## 2023-08-12 NOTE — ED Triage Notes (Signed)
Pt arrived via ems to the er for the c/o back pain. Pt had surgery on l3 and l2 per pt on Monday for arthritis. Pt has a hx of back surgeries. Bp elevated per ems. Pain 10/10. Pt only tried to control pain with tylenol.

## 2023-08-17 ENCOUNTER — Telehealth: Payer: Self-pay | Admitting: Internal Medicine

## 2023-08-17 DIAGNOSIS — H353114 Nonexudative age-related macular degeneration, right eye, advanced atrophic with subfoveal involvement: Secondary | ICD-10-CM | POA: Diagnosis not present

## 2023-08-17 DIAGNOSIS — H43813 Vitreous degeneration, bilateral: Secondary | ICD-10-CM | POA: Diagnosis not present

## 2023-08-17 DIAGNOSIS — H353212 Exudative age-related macular degeneration, right eye, with inactive choroidal neovascularization: Secondary | ICD-10-CM | POA: Diagnosis not present

## 2023-08-17 DIAGNOSIS — H35423 Microcystoid degeneration of retina, bilateral: Secondary | ICD-10-CM | POA: Diagnosis not present

## 2023-08-17 DIAGNOSIS — H353221 Exudative age-related macular degeneration, left eye, with active choroidal neovascularization: Secondary | ICD-10-CM | POA: Diagnosis not present

## 2023-08-17 DIAGNOSIS — H353123 Nonexudative age-related macular degeneration, left eye, advanced atrophic without subfoveal involvement: Secondary | ICD-10-CM | POA: Diagnosis not present

## 2023-08-17 NOTE — Telephone Encounter (Signed)
Patient would like order for new CPAP machine. Patient uses [facility] for CPAP machine. States machine she has now is dropping pressure. Machine was a recall. Last sleep test was with [facility].  Patient phone number is [phone].

## 2023-08-18 ENCOUNTER — Ambulatory Visit: Payer: PPO

## 2023-08-18 NOTE — Telephone Encounter (Signed)
Patient checking on message for CPAP machine. Patient phone number is [phone].

## 2023-08-20 ENCOUNTER — Other Ambulatory Visit: Payer: Self-pay

## 2023-08-20 DIAGNOSIS — G4733 Obstructive sleep apnea (adult) (pediatric): Secondary | ICD-10-CM

## 2023-08-20 NOTE — Telephone Encounter (Signed)
Find a copy of patient sleep study given to Dr Loura Back to Review

## 2023-08-20 NOTE — Telephone Encounter (Signed)
I don't see any cpap or sleep studies in EPIC and we don't keep them on file separately from the chart.   I don't do sleep medicine but happy pass along recs from [place] to get set back up on it with f/u by one of our sleep medicine providers in future

## 2023-08-20 NOTE — Telephone Encounter (Signed)
Patient states that the last sleep study was done at [facility] and that Dr Sherene Sires has a copy. Please advise settings so CPAP can be ordered.

## 2023-08-25 DIAGNOSIS — H353221 Exudative age-related macular degeneration, left eye, with active choroidal neovascularization: Secondary | ICD-10-CM | POA: Diagnosis not present

## 2023-09-07 DIAGNOSIS — G4733 Obstructive sleep apnea (adult) (pediatric): Secondary | ICD-10-CM | POA: Diagnosis not present

## 2023-09-13 ENCOUNTER — Other Ambulatory Visit: Payer: Self-pay | Admitting: Family

## 2023-09-13 ENCOUNTER — Other Ambulatory Visit: Payer: Self-pay | Admitting: Surgical

## 2023-09-13 ENCOUNTER — Other Ambulatory Visit: Payer: Self-pay | Admitting: Internal Medicine

## 2023-09-13 DIAGNOSIS — K2970 Gastritis, unspecified, without bleeding: Secondary | ICD-10-CM

## 2023-09-22 DIAGNOSIS — H353221 Exudative age-related macular degeneration, left eye, with active choroidal neovascularization: Secondary | ICD-10-CM | POA: Diagnosis not present

## 2023-10-06 ENCOUNTER — Encounter: Payer: Self-pay | Admitting: Physician Assistant

## 2023-10-06 ENCOUNTER — Ambulatory Visit: Payer: PPO | Admitting: Internal Medicine

## 2023-10-06 ENCOUNTER — Ambulatory Visit: Payer: PPO | Admitting: Physician Assistant

## 2023-10-06 VITALS — BP 128/76 | HR 69 | Temp 98.1°F | Resp 22 | Ht 60.0 in | Wt 206.1 lb

## 2023-10-06 DIAGNOSIS — K551 Chronic vascular disorders of intestine: Secondary | ICD-10-CM | POA: Diagnosis not present

## 2023-10-06 NOTE — Progress Notes (Signed)
Office Note   History of Present Illness   Alicia [name] is a 77 y.o. (1946-01-11) female who presents for follow up of mesenteric artery stenosis.  She has a history of abdominal aortogram with celiac artery stenting by Dr. Randie Heinz on 05/18/2022.  Her symptoms of chronic mesenteric ischemia were resolved postintervention.   She has chronic issues with lower back pain.  She also has issues with lower extremity neuropathy and cramping in her calves at night.   She returns today for follow-up.  She recently underwent lumbar decompression surgery in September.  She is healing up well.  She denies any abdominal pain, bloating, constipation, food fear, unintentional weight loss, or postprandial pain.  She has previously struggled with constipation, however her bowel movements are now regular.   She states her neuropathy in her feet is still fairly bothersome at night.  She will wake up somewhere between 2-3 nights a week and 1 or both of her feet hurts her.  She describes the pain as "pins-and-needles".  She gets relief after hanging her feet off the bed for about 30 minutes.  The pain is never consistently in just one foot.  She also still occasionally wakes up with cramps in her left calf, which resolves after walking around for a couple of minutes.  She denies any explicit symptoms of claudication.  Current Outpatient Medications  Medication Sig Dispense Refill   acetaminophen (TYLENOL) 500 MG tablet Take 1,000 mg by mouth every 6 (six) hours.     albuterol (VENTOLIN HFA) 108 (90 Base) MCG/ACT inhaler INHALE 1-2 PUFFS INTO THE LUNGS 4 (FOUR) TIMES DAILY AS NEEDED FOR SHORTNESS OF BREATH OR WHEEZING. 18 each 0   aspirin EC 81 MG tablet Take 81 mg by mouth every other day. Swallow whole.     Cholecalciferol (VITAMIN D3) 125 MCG (5000 UT) CAPS Take 5,000 Units by mouth in the morning.     gabapentin (NEURONTIN) 300 MG capsule TAKE 1 CAPSULE BY MOUTH THREE TIMES A DAY 270 capsule 1    hydrocortisone 2.5 % cream Apply to rash on face and under breasts once to twice daily as directed. (Patient taking differently: Apply 1 Application topically daily as needed (for dry nose).) 30 g 2   Lutein 20 MG TABS Take 20 mg by mouth in the morning.     Magnesium 400 MG TABS Take 400 mg by mouth at bedtime. Magnesium Complex     omeprazole (PRILOSEC) 40 MG capsule TAKE 1 CAPSULE (40 MG TOTAL) BY MOUTH DAILY. 90 capsule 1   Polyvinyl Alcohol-Povidone (REFRESH OP) Place 1 drop into both eyes as needed (dry eye).     pramipexole (MIRAPEX) 0.5 MG tablet TAKE 1 TABLET BY MOUTH TWICE A DAY 180 tablet 1   rosuvastatin (CRESTOR) 10 MG tablet TAKE 1 TABLET BY MOUTH EVERY DAY 90 tablet 3   venlafaxine XR (EFFEXOR-XR) 75 MG 24 hr capsule Take 1 capsule (75 mg total) by mouth daily. 90 capsule 3   BREZTRI AEROSPHERE 160-9-4.8 MCG/ACT AERO INHALE 2 PUFFS INTO THE LUNGS TWICE DAILY. (Patient not taking: Reported on 10/06/2023) 10.7 each 11   No current facility-administered medications for this visit.    REVIEW OF SYSTEMS (negative unless checked):   Cardiac:  []  Chest pain or chest pressure? []  Shortness of breath upon activity? []  Shortness of breath when lying flat? []  Irregular heart rhythm?  Vascular:  []  Pain in calf, thigh, or hip brought on by walking? [x]  Pain in feet  at night that wakes you up from your sleep? []  Blood clot in your veins? []  Leg swelling?  Pulmonary:  []  Oxygen at home? []  Productive cough? []  Wheezing?  Neurologic:  []  Sudden weakness in arms or legs? []  Sudden numbness in arms or legs? []  Sudden onset of difficult speaking or slurred speech? []  Temporary loss of vision in one eye? []  Problems with dizziness?  Gastrointestinal:  []  Blood in stool? []  Vomited blood?  Genitourinary:  []  Burning when urinating? []  Blood in urine?  Psychiatric:  []  Major depression  Hematologic:  []  Bleeding problems? []  Problems with blood clotting?  Dermatologic:   []  Rashes or ulcers?  Constitutional:  []  Fever or chills?  Ear/Nose/Throat:  []  Change in hearing? []  Nose bleeds? []  Sore throat?  Musculoskeletal:  []  Back pain? []  Joint pain? []  Muscle pain?   Physical Examination   Vitals:   10/06/23 0836  BP: 128/76  Pulse: 69  Resp: (!) 22  Temp: 98.1 F (36.7 C)  TempSrc: Temporal  SpO2: 93%  Weight: 206 lb 1.6 oz (93.5 kg)  Height: 5' (1.524 m)   Body mass index is 40.25 kg/m.  General:  WDWN in NAD; vital signs documented above Gait: Not observed HENT: WNL, normocephalic Pulmonary: normal non-labored breathing , without rales, rhonchi,  wheezing Cardiac: regular Abdomen: soft, NT, no masses Skin: without rashes Vascular Exam/Pulses: nonpalpable pedal pulses. Intact DP/PT doppler signals bilaterally Extremities: without ischemic changes, without gangrene , without cellulitis; without open wounds;  Musculoskeletal: no muscle wasting or atrophy  Neurologic: A&O X 3;  No focal weakness or paresthesias are detected Psychiatric:  The pt has Normal affect.   Non-Invasive Vascular Imaging   Mesenteric Duplex (07/21/2023) +----------------------+--------+--------+------+------------------+  Mesenteric           PSV cm/sEDV cm/sPlaque     Comments       +----------------------+--------+--------+------+------------------+  Aorta Mid                47                                     +----------------------+--------+--------+------+------------------+  Celiac Artery Origin                        Unable to insonate  +----------------------+--------+--------+------+------------------+  Celiac Artery Proximal                      Unable to insonate  +----------------------+--------+--------+------+------------------+  SMA Origin                                  Unable to insonate  +----------------------+--------+--------+------+------------------+  SMA Proximal            159      29                              +----------------------+--------+--------+------+------------------+  CHA                                        Unable to insonate  +----------------------+--------+--------+------+------------------+  Splenic                276  87                             +----------------------+--------+--------+------+------------------+  IMA                                        Unable to insonate  +----------------------+--------+--------+------+------------------+    Medical Decision Making   DANEYA FATEMI is a 77 y.o. (Jan 22, 1946) female who presents for surveillance of celiac artery stenosis  Based on this patient's duplex, there is no SMA stenosis.  The celiac artery and common hepatic arteries were difficult to visualize due to gas.  Previous duplex suggested stable, known celiac artery stenosis She denies any return of her chronic mesenteric ischemia symptoms.  She has no food fear, postprandial pain, or unintentional weight loss She has continued issues with pain in her feet at night.  She describes this as pins-and-needles.  Her pain does not wake her up every night and is not consistent in its pattern.  This sounds more like neuropathy rather than rest pain.  Her last ABIs were done in 2021 and demonstrated normal arterial flow On exam she has no abdominal tenderness.  She has nonpalpable pedal pulses.  She does have intact DP/PT Doppler signals bilaterally Given that she has nonpalpable pedal pulses, she would benefit from repeat ABIs. Although her pain sounds neuropathic in nature we can try to rule out early rest pain.  She can return to our office in 2 months with ABIs.  Her mesenteric duplex can be repeated in 1 year   Loel Dubonnet PA-C [facility] Office: [phone]  Clinic MD: Randie Heinz

## 2023-10-08 DIAGNOSIS — G4733 Obstructive sleep apnea (adult) (pediatric): Secondary | ICD-10-CM | POA: Diagnosis not present

## 2023-10-12 ENCOUNTER — Other Ambulatory Visit: Payer: Self-pay

## 2023-10-12 DIAGNOSIS — Z789 Other specified health status: Secondary | ICD-10-CM

## 2023-10-12 DIAGNOSIS — K551 Chronic vascular disorders of intestine: Secondary | ICD-10-CM

## 2023-10-13 DIAGNOSIS — H353114 Nonexudative age-related macular degeneration, right eye, advanced atrophic with subfoveal involvement: Secondary | ICD-10-CM | POA: Diagnosis not present

## 2023-10-13 DIAGNOSIS — H353221 Exudative age-related macular degeneration, left eye, with active choroidal neovascularization: Secondary | ICD-10-CM | POA: Diagnosis not present

## 2023-10-13 DIAGNOSIS — H35423 Microcystoid degeneration of retina, bilateral: Secondary | ICD-10-CM | POA: Diagnosis not present

## 2023-10-13 DIAGNOSIS — H353212 Exudative age-related macular degeneration, right eye, with inactive choroidal neovascularization: Secondary | ICD-10-CM | POA: Diagnosis not present

## 2023-10-13 DIAGNOSIS — H353123 Nonexudative age-related macular degeneration, left eye, advanced atrophic without subfoveal involvement: Secondary | ICD-10-CM | POA: Diagnosis not present

## 2023-10-13 DIAGNOSIS — H43813 Vitreous degeneration, bilateral: Secondary | ICD-10-CM | POA: Diagnosis not present

## 2023-10-18 DIAGNOSIS — G4733 Obstructive sleep apnea (adult) (pediatric): Secondary | ICD-10-CM | POA: Diagnosis not present

## 2023-10-23 NOTE — Telephone Encounter (Signed)
I do not see a copy of the actual sleep study in EPIC and I don't keep recoerds separate from [facility].  There is a note under Search that suggests moderate OSA but not the actual study.  Get her set up with one of our providers or NPs who does sleep medicine and with me in [place] or [place] if she wishes or one of our providers in [place] who does both.

## 2023-10-25 ENCOUNTER — Other Ambulatory Visit: Payer: Self-pay

## 2023-10-25 ENCOUNTER — Ambulatory Visit: Payer: PPO | Admitting: Family

## 2023-10-25 ENCOUNTER — Inpatient Hospital Stay: Payer: PPO | Attending: Oncology

## 2023-10-25 DIAGNOSIS — K5900 Constipation, unspecified: Secondary | ICD-10-CM | POA: Diagnosis not present

## 2023-10-25 DIAGNOSIS — F17210 Nicotine dependence, cigarettes, uncomplicated: Secondary | ICD-10-CM | POA: Diagnosis not present

## 2023-10-25 DIAGNOSIS — D509 Iron deficiency anemia, unspecified: Secondary | ICD-10-CM | POA: Diagnosis not present

## 2023-10-25 DIAGNOSIS — Z23 Encounter for immunization: Secondary | ICD-10-CM | POA: Diagnosis not present

## 2023-10-25 LAB — FERRITIN: Ferritin: 6 ng/mL — ABNORMAL LOW (ref 11–307)

## 2023-10-25 LAB — CBC WITH DIFFERENTIAL (CANCER CENTER ONLY)
Abs Immature Granulocytes: 0.03 10*3/uL (ref 0.00–0.07)
Basophils Absolute: 0 10*3/uL (ref 0.0–0.1)
Basophils Relative: 1 %
Eosinophils Absolute: 0.2 10*3/uL (ref 0.0–0.5)
Eosinophils Relative: 2 %
HCT: 34.8 % — ABNORMAL LOW (ref 36.0–46.0)
Hemoglobin: 11 g/dL — ABNORMAL LOW (ref 12.0–15.0)
Immature Granulocytes: 0 %
Lymphocytes Relative: 24 %
Lymphs Abs: 1.7 10*3/uL (ref 0.7–4.0)
MCH: 26.6 pg (ref 26.0–34.0)
MCHC: 31.6 g/dL (ref 30.0–36.0)
MCV: 84.3 fL (ref 80.0–100.0)
Monocytes Absolute: 0.5 10*3/uL (ref 0.1–1.0)
Monocytes Relative: 8 %
Neutro Abs: 4.7 10*3/uL (ref 1.7–7.7)
Neutrophils Relative %: 65 %
Platelet Count: 212 10*3/uL (ref 150–400)
RBC: 4.13 MIL/uL (ref 3.87–5.11)
RDW: 14.7 % (ref 11.5–15.5)
WBC Count: 7.2 10*3/uL (ref 4.0–10.5)
nRBC: 0 % (ref 0.0–0.2)

## 2023-10-25 LAB — IRON AND TIBC
Iron: 37 ug/dL (ref 28–170)
Saturation Ratios: 8 % — ABNORMAL LOW (ref 10.4–31.8)
TIBC: 479 ug/dL — ABNORMAL HIGH (ref 250–450)
UIBC: 442 ug/dL

## 2023-10-26 ENCOUNTER — Inpatient Hospital Stay: Payer: PPO

## 2023-10-26 ENCOUNTER — Encounter: Payer: Self-pay | Admitting: Oncology

## 2023-10-26 ENCOUNTER — Inpatient Hospital Stay (HOSPITAL_BASED_OUTPATIENT_CLINIC_OR_DEPARTMENT_OTHER): Payer: PPO | Admitting: Oncology

## 2023-10-26 VITALS — BP 135/60 | HR 81 | Temp 98.0°F | Resp 18 | Ht 60.0 in | Wt 205.0 lb

## 2023-10-26 VITALS — BP 137/72 | HR 76 | Resp 18

## 2023-10-26 DIAGNOSIS — D509 Iron deficiency anemia, unspecified: Secondary | ICD-10-CM | POA: Diagnosis not present

## 2023-10-26 DIAGNOSIS — Z23 Encounter for immunization: Secondary | ICD-10-CM

## 2023-10-26 MED ORDER — IRON SUCROSE 20 MG/ML IV SOLN
200.0000 mg | Freq: Once | INTRAVENOUS | Status: AC
  Administered 2023-10-26: 200 mg via INTRAVENOUS

## 2023-10-26 MED ORDER — INFLUENZA VAC A&B SURF ANT ADJ 0.5 ML IM SUSY
0.5000 mL | PREFILLED_SYRINGE | Freq: Once | INTRAMUSCULAR | Status: AC
  Administered 2023-10-26: 0.5 mL via INTRAMUSCULAR
  Filled 2023-10-26: qty 0.5

## 2023-10-26 NOTE — Patient Instructions (Signed)
Iron Sucrose Injection What is this medication? IRON SUCROSE (EYE ern SOO krose) treats low levels of iron (iron deficiency anemia) in people with kidney disease. Iron is a mineral that plays an important role in making red blood cells, which carry oxygen from your lungs to the rest of your body. This medicine may be used for other purposes; ask your health care provider or pharmacist if you have questions. COMMON BRAND NAME(S): Venofer What should I tell my care team before I take this medication? They need to know if you have any of these conditions: Anemia not caused by low iron levels Heart disease High levels of iron in the blood Kidney disease Liver disease An unusual or allergic reaction to iron, other medications, foods, dyes, or preservatives Pregnant or trying to get pregnant Breastfeeding How should I use this medication? This medication is for infusion into a vein. It is given in a hospital or clinic setting. Talk to your care team about the use of this medication in children. While this medication may be prescribed for children as young as 2 years for selected conditions, precautions do apply. Overdosage: If you think you have taken too much of this medicine contact a poison control center or emergency room at once. NOTE: This medicine is only for you. Do not share this medicine with others. What if I miss a dose? Keep appointments for follow-up doses. It is important not to miss your dose. Call your care team if you are unable to keep an appointment. What may interact with this medication? Do not take this medication with any of the following: Deferoxamine Dimercaprol Other iron products This medication may also interact with the following: Chloramphenicol Deferasirox This list may not describe all possible interactions. Give your health care provider a list of all the medicines, herbs, non-prescription drugs, or dietary supplements you use. Also tell them if you smoke,  drink alcohol, or use illegal drugs. Some items may interact with your medicine. What should I watch for while using this medication? Visit your care team regularly. Tell your care team if your symptoms do not start to get better or if they get worse. You may need blood work done while you are taking this medication. You may need to follow a special diet. Talk to your care team. Foods that contain iron include: whole grains/cereals, dried fruits, beans, or peas, leafy green vegetables, and organ meats (liver, kidney). What side effects may I notice from receiving this medication? Side effects that you should report to your care team as soon as possible: Allergic reactions--skin rash, itching, hives, swelling of the face, lips, tongue, or throat Low blood pressure--dizziness, feeling faint or lightheaded, blurry vision Shortness of breath Side effects that usually do not require medical attention (report to your care team if they continue or are bothersome): Flushing Headache Joint pain Muscle pain Nausea Pain, redness, or irritation at injection site This list may not describe all possible side effects. Call your doctor for medical advice about side effects. You may report side effects to FDA at 1-800-FDA-1088. Where should I keep my medication? This medication is given in a hospital or clinic. It will not be stored at home. NOTE: This sheet is a summary. It may not cover all possible information. If you have questions about this medicine, talk to your doctor, pharmacist, or health care provider.  2024 Elsevier/Gold Standard (2023-04-02 00:00:00)

## 2023-10-26 NOTE — Progress Notes (Unsigned)
States she has been "breaking out" in abdomen area and wonders if it has to do with her anemia.

## 2023-10-26 NOTE — Progress Notes (Unsigned)
[facility]  [phone] [phone]  ID: Alicia [name] OB: 09-Jul-1946  MR#: [id number]  SAY#:[id number]  Patient Care Team: Mort Sawyers, FNP as PCP - General (Family Medicine)  CHIEF COMPLAINT: Iron deficiency anemia.  INTERVAL HISTORY: Patient returns to clinic today for repeat laboratory work, further evaluation, and continuation of IV Venofer.  She currently feels well and is asymptomatic.  She does not complain of any weakness or fatigue. She has no neurologic complaints.  She denies any recent fevers or illnesses.  She has a good appetite and denies weight loss.  She has no chest pain, shortness of breath, cough, or hemoptysis.  She denies any nausea, vomiting, constipation, or diarrhea.  She has no melena or hematochezia.   She has no urinary complaints.  Patient offers no specific complaints today.  REVIEW OF SYSTEMS:   Review of Systems  Constitutional: Negative.  Negative for fever, malaise/fatigue and weight loss.  Respiratory: Negative.  Negative for cough, hemoptysis and shortness of breath.   Cardiovascular: Negative.  Negative for chest pain and leg swelling.  Gastrointestinal: Negative.  Negative for abdominal pain, blood in stool, constipation, diarrhea, melena and nausea.  Genitourinary: Negative.  Negative for hematuria.  Musculoskeletal: Negative.  Negative for back pain and joint pain.  Skin: Negative.  Negative for rash.  Neurological: Negative.  Negative for dizziness, focal weakness, weakness and headaches.  Psychiatric/Behavioral: Negative.  The patient is not nervous/anxious.     As per HPI. Otherwise, a complete review of systems is negative.  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   R mid forearm, [facility]    PAST SURGICAL HISTORY: Past Surgical History:  Procedure Laterality Date   APPENDECTOMY  10/2016   BICEPT TENODESIS Left 05/18/2023   Procedure: BICEPS TENODESIS;  Surgeon: Cammy Copa, MD;  Location: [facility] OR;  Service: Orthopedics;  Laterality: Left;   BREAST BIOPSY Right 2022   CELIAC ARTERY STENT     CHOLECYSTECTOMY  10/2016   COLONOSCOPY     12/2022   ENTEROSCOPY N/A 12/17/2022   Procedure: ENTEROSCOPY;  Surgeon: Imogene Burn, MD;  Location: [facility] ENDOSCOPY;  Service: Gastroenterology;  Laterality: N/A;  small bowel enteroscopy   EYE SURGERY Bilateral 2017   cataracts removed   HOT HEMOSTASIS  12/17/2022   Procedure: HOT HEMOSTASIS (ARGON PLASMA COAGULATION/BICAP);  Surgeon: Imogene Burn, MD;  Location: [facility];  Service: Gastroenterology;;   LUMBAR LAMINECTOMY/DECOMPRESSION MICRODISCECTOMY Left 07/21/2021   Procedure: Laminectomy and Foraminotomy - L4-L5 - left;  Surgeon: Donalee Citrin, MD;  Location: [facility] OR;  Service: Neurosurgery;  Laterality: Left;  3C   LUMBAR LAMINECTOMY/DECOMPRESSION MICRODISCECTOMY Right 08/09/2023   Procedure: Sublaminar decompression - Lumbar Two-Lumbar Three - right;  Surgeon: Donalee Citrin, MD;  Location: [facility] OR;  Service: Neurosurgery;  Laterality: Right;  3C   PAROTID GLAND TUMOR EXCISION Left 2003   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   Procedure: PERIPHERAL VASCULAR INTERVENTION;  Surgeon: Maeola Harman, MD;  Location: [facility] INVASIVE CV LAB;  Service: Cardiovascular;;  celiac   REVERSE SHOULDER ARTHROPLASTY Left 05/18/2023   Procedure: LEFT REVERSE SHOULDER ARTHROPLASTY;  Surgeon: Cammy Copa, MD;  Location: [facility] OR;  Service: Orthopedics;  Laterality: Left;   TOTAL SHOULDER ARTHROPLASTY Right 08/06/2022   Procedure: RIGHT REVERSE SHOULDER ARTHROPLASTY;  Surgeon: Cammy Copa, MD;  Location: [facility] OR;  Service: Orthopedics;  Laterality: Right;   VISCERAL ANGIOGRAPHY N/A 05/18/2022   Procedure: VISCERAL ANGIOGRAPHY;  Surgeon: Maeola Harman, MD;  Location: [facility] INVASIVE CV LAB;  Service: Cardiovascular;  Laterality: N/A;    FAMILY HISTORY: Family History  Problem Relation Age of Onset   Heart disease Mother    Cerebral aneurysm Father     ADVANCED DIRECTIVES (Y/N):  N  HEALTH MAINTENANCE: Social History   Tobacco Use   Smoking status: Every Day    Current packs/day: 1.00    Average packs/day: 1 pack/day for 62.0 years (62.0 ttl pk-yrs)    Types: Cigarettes    Passive exposure: Current   Smokeless tobacco: Never   Tobacco comments:    Smokes 1- 1.5 PPD 01/29/2023  Vaping Use   Vaping status: Never Used  Substance Use Topics   Alcohol use: Not Currently   Drug use: Never     Colonoscopy:  PAP:  Bone density:  Lipid panel:  Allergies  Allergen Reactions   Amoxicillin Other (See Comments)    Yeast infections (in high doses)    Current Outpatient Medications  Medication Sig Dispense Refill   acetaminophen (TYLENOL) 500 MG tablet Take 1,000 mg by mouth every 6 (six) hours.     albuterol (VENTOLIN HFA) 108 (90 Base) MCG/ACT inhaler INHALE 1-2 PUFFS INTO THE LUNGS 4 (FOUR) TIMES DAILY AS NEEDED FOR SHORTNESS OF BREATH OR WHEEZING. 18 each 0   aspirin EC 81 MG tablet Take 81 mg by mouth every other day. Swallow whole.     Cholecalciferol (VITAMIN D3) 125 MCG (5000 UT) CAPS Take 5,000 Units by mouth in the morning.     gabapentin (NEURONTIN) 300  MG capsule TAKE 1 CAPSULE BY MOUTH THREE TIMES A DAY 270 capsule 1   hydrocortisone 2.5 % cream Apply to rash on face and under breasts once to twice daily as directed. (Patient taking differently: Apply 1 Application topically daily as needed (for dry nose).) 30 g 2   Lutein 20 MG TABS Take 20 mg by mouth in the morning.     Magnesium 400 MG TABS Take 400 mg by mouth at bedtime. Magnesium Complex     omeprazole (PRILOSEC) 40 MG capsule TAKE 1 CAPSULE (40 MG TOTAL) BY MOUTH DAILY. 90 capsule 1   Polyvinyl Alcohol-Povidone (REFRESH OP) Place 1 drop into both eyes as needed (dry eye).     pramipexole (MIRAPEX) 0.5 MG tablet TAKE 1 TABLET BY MOUTH TWICE A DAY 180 tablet 1   rosuvastatin (CRESTOR) 10 MG tablet TAKE 1 TABLET BY MOUTH EVERY DAY 90 tablet 3   venlafaxine XR (EFFEXOR-XR) 75 MG 24 hr capsule Take 1 capsule (75 mg total) by mouth daily. 90 capsule 3  BREZTRI AEROSPHERE 160-9-4.8 MCG/ACT AERO INHALE 2 PUFFS INTO THE LUNGS TWICE DAILY. (Patient not taking: Reported on 10/06/2023) 10.7 each 11   No current facility-administered medications for this visit.    OBJECTIVE: Vitals:   10/26/23 1318  BP: 135/60  Pulse: 81  Resp: 18  Temp: 98 F (36.7 C)  SpO2: 98%     Body mass index is 40.04 kg/m.    ECOG FS:0 - Asymptomatic  General: Well-developed, well-nourished, no acute distress. Eyes: Pink conjunctiva, anicteric sclera. HEENT: Normocephalic, moist mucous membranes. Lungs: No audible wheezing or coughing. Heart: Regular rate and rhythm. Abdomen: Soft, nontender, no obvious distention. Musculoskeletal: No edema, cyanosis, or clubbing. Neuro: Alert, answering all questions appropriately. Cranial nerves grossly intact. Skin: No rashes or petechiae noted. Psych: Normal affect.  LAB RESULTS:  Lab Results  Component Value Date   NA 137 08/06/2023   K 4.1 08/06/2023   CL 107 08/06/2023   CO2 23 08/06/2023   GLUCOSE 105 (H) 08/06/2023   BUN 19 08/06/2023   CREATININE 0.96  08/06/2023   CALCIUM 9.0 08/06/2023   PROT 7.0 12/28/2022   ALBUMIN 4.0 12/28/2022   AST 13 12/28/2022   ALT 9 12/28/2022   ALKPHOS 86 12/28/2022   BILITOT 0.3 12/28/2022   GFRNONAA >60 08/06/2023    Lab Results  Component Value Date   WBC 7.2 10/25/2023   NEUTROABS 4.7 10/25/2023   HGB 11.0 (L) 10/25/2023   HCT 34.8 (L) 10/25/2023   MCV 84.3 10/25/2023   PLT 212 10/25/2023   Lab Results  Component Value Date   IRON 37 10/25/2023   TIBC 479 (H) 10/25/2023   IRONPCTSAT 8 (L) 10/25/2023   Lab Results  Component Value Date   FERRITIN 6 (L) 10/25/2023     STUDIES: No results found.  ASSESSMENT: Iron deficiency anemia.  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  Her most recent endoscopic was in February 2024.  Patient's hemoglobin has trended down to 11.0 and her iron stores remain significantly decreased.  Proceed with 200 mg IV Venofer today.  Patient will then return to clinic 4 times over the next 2 to 3 weeks for additional treatment.  She would then return to clinic in 4 months with repeat laboratory, further evaluation, and continuation of treatment if necessary.      Constipation/nausea: Patient does not complain of this today.  Continue evaluation and treatment per GI. Back pain: Patient does not complain of this today.  Continue follow-up with orthopedics as scheduled.  I spent a total of 30 minutes reviewing chart data, face-to-face evaluation with the patient, counseling and coordination of care as detailed above.   Patient expressed understanding and was in agreement with this plan. She also understands that She can call clinic at any time with any questions, concerns, or complaints.    Jeralyn Ruths, MD   10/26/2023 1:32 PM

## 2023-10-27 ENCOUNTER — Encounter: Payer: Self-pay | Admitting: Oncology

## 2023-10-27 DIAGNOSIS — H353221 Exudative age-related macular degeneration, left eye, with active choroidal neovascularization: Secondary | ICD-10-CM | POA: Diagnosis not present

## 2023-10-29 ENCOUNTER — Telehealth: Payer: Self-pay | Admitting: *Deleted

## 2023-10-29 NOTE — Telephone Encounter (Signed)
Call to patient and completed 90 day episode of care related to Left reverse total shoulder. SANE=95% per patient.

## 2023-10-29 NOTE — Telephone Encounter (Signed)
Called and there was no answer- line rings and then busy tone, WCB

## 2023-11-01 ENCOUNTER — Inpatient Hospital Stay: Payer: PPO

## 2023-11-01 VITALS — BP 130/67 | HR 75 | Temp 97.9°F | Resp 18

## 2023-11-01 DIAGNOSIS — D509 Iron deficiency anemia, unspecified: Secondary | ICD-10-CM | POA: Diagnosis not present

## 2023-11-01 MED ORDER — IRON SUCROSE 20 MG/ML IV SOLN
200.0000 mg | Freq: Once | INTRAVENOUS | Status: AC
  Administered 2023-11-01: 200 mg via INTRAVENOUS
  Filled 2023-11-01: qty 10

## 2023-11-01 MED ORDER — SODIUM CHLORIDE 0.9% FLUSH
10.0000 mL | Freq: Once | INTRAVENOUS | Status: AC | PRN
Start: 2023-11-01 — End: 2023-11-01
  Administered 2023-11-01: 10 mL
  Filled 2023-11-01: qty 10

## 2023-11-01 NOTE — Progress Notes (Signed)
 Patient tolerated Venofer infusion well. Explained recommendation of 30 min post monitoring. Patient refused to wait post monitoring. Educated on what signs to watch for & to call with any concerns. No questions/symptoms, discharged. Stable

## 2023-11-01 NOTE — Patient Instructions (Signed)
Iron Sucrose Injection What is this medication? IRON SUCROSE (EYE ern SOO krose) treats low levels of iron (iron deficiency anemia) in people with kidney disease. Iron is a mineral that plays an important role in making red blood cells, which carry oxygen from your lungs to the rest of your body. This medicine may be used for other purposes; ask your health care provider or pharmacist if you have questions. COMMON BRAND NAME(S): Venofer What should I tell my care team before I take this medication? They need to know if you have any of these conditions: Anemia not caused by low iron levels Heart disease High levels of iron in the blood Kidney disease Liver disease An unusual or allergic reaction to iron, other medications, foods, dyes, or preservatives Pregnant or trying to get pregnant Breastfeeding How should I use this medication? This medication is for infusion into a vein. It is given in a hospital or clinic setting. Talk to your care team about the use of this medication in children. While this medication may be prescribed for children as young as 2 years for selected conditions, precautions do apply. Overdosage: If you think you have taken too much of this medicine contact a poison control center or emergency room at once. NOTE: This medicine is only for you. Do not share this medicine with others. What if I miss a dose? Keep appointments for follow-up doses. It is important not to miss your dose. Call your care team if you are unable to keep an appointment. What may interact with this medication? Do not take this medication with any of the following: Deferoxamine Dimercaprol Other iron products This medication may also interact with the following: Chloramphenicol Deferasirox This list may not describe all possible interactions. Give your health care provider a list of all the medicines, herbs, non-prescription drugs, or dietary supplements you use. Also tell them if you smoke,  drink alcohol, or use illegal drugs. Some items may interact with your medicine. What should I watch for while using this medication? Visit your care team regularly. Tell your care team if your symptoms do not start to get better or if they get worse. You may need blood work done while you are taking this medication. You may need to follow a special diet. Talk to your care team. Foods that contain iron include: whole grains/cereals, dried fruits, beans, or peas, leafy green vegetables, and organ meats (liver, kidney). What side effects may I notice from receiving this medication? Side effects that you should report to your care team as soon as possible: Allergic reactions--skin rash, itching, hives, swelling of the face, lips, tongue, or throat Low blood pressure--dizziness, feeling faint or lightheaded, blurry vision Shortness of breath Side effects that usually do not require medical attention (report to your care team if they continue or are bothersome): Flushing Headache Joint pain Muscle pain Nausea Pain, redness, or irritation at injection site This list may not describe all possible side effects. Call your doctor for medical advice about side effects. You may report side effects to FDA at 1-800-FDA-1088. Where should I keep my medication? This medication is given in a hospital or clinic. It will not be stored at home. NOTE: This sheet is a summary. It may not cover all possible information. If you have questions about this medicine, talk to your doctor, pharmacist, or health care provider.  2024 Elsevier/Gold Standard (2023-04-02 00:00:00)

## 2023-11-01 NOTE — Telephone Encounter (Signed)
Left message for patient to call clinic to get appointment with one of our providers or NPs that do sleep medicine per Dr. Sherene Sires.  Patient is wanting a CPAP ordered. Can be [facility] or [facility].  Will be new sleep consult as patient has only seen Dr. Sherene Sires here.

## 2023-11-04 NOTE — Telephone Encounter (Signed)
LM for PT to call for appt w/Sleep specialist. Closing because this is the 2nd attempt.

## 2023-11-05 ENCOUNTER — Telehealth: Payer: Self-pay | Admitting: Primary Care

## 2023-11-05 NOTE — Telephone Encounter (Signed)
Pt calling in to inform us she has rcvd her CPAP machine

## 2023-11-07 DIAGNOSIS — G4733 Obstructive sleep apnea (adult) (pediatric): Secondary | ICD-10-CM | POA: Diagnosis not present

## 2023-11-08 ENCOUNTER — Inpatient Hospital Stay: Payer: PPO

## 2023-11-08 VITALS — BP 120/56 | HR 86 | Temp 98.9°F | Resp 16

## 2023-11-08 DIAGNOSIS — D509 Iron deficiency anemia, unspecified: Secondary | ICD-10-CM | POA: Diagnosis not present

## 2023-11-08 MED ORDER — IRON SUCROSE 20 MG/ML IV SOLN
200.0000 mg | Freq: Once | INTRAVENOUS | Status: AC
  Administered 2023-11-08: 200 mg via INTRAVENOUS
  Filled 2023-11-08: qty 10

## 2023-11-08 NOTE — Progress Notes (Signed)
Declined 30 minute observation. Aware of risks. Vitals stable at discharge.  

## 2023-11-09 ENCOUNTER — Ambulatory Visit: Payer: PPO | Admitting: Primary Care

## 2023-11-09 ENCOUNTER — Encounter: Payer: Self-pay | Admitting: Primary Care

## 2023-11-09 VITALS — BP 132/78 | HR 90 | Temp 97.1°F | Ht 60.0 in | Wt 207.2 lb

## 2023-11-09 DIAGNOSIS — J4489 Other specified chronic obstructive pulmonary disease: Secondary | ICD-10-CM | POA: Diagnosis not present

## 2023-11-09 DIAGNOSIS — F17210 Nicotine dependence, cigarettes, uncomplicated: Secondary | ICD-10-CM | POA: Diagnosis not present

## 2023-11-09 DIAGNOSIS — G4733 Obstructive sleep apnea (adult) (pediatric): Secondary | ICD-10-CM

## 2023-11-09 MED ORDER — FLUTICASONE-SALMETEROL 250-50 MCG/ACT IN AEPB: 1.0000 | INHALATION_SPRAY | Freq: Two times a day (BID) | RESPIRATORY_TRACT | 3 refills | Status: DC

## 2023-11-09 NOTE — Patient Instructions (Signed)
-  OBSTRUCTIVE SLEEP APNEA: Obstructive sleep apnea is a condition where your airway becomes blocked during sleep, causing breathing pauses. You are doing well with your CPAP machine, using it every night for an average of 7 hours and 45 minutes. Continue using your CPAP machine as you have been.  -ASTHMA: Asthma is a condition that causes your airways to become inflamed and narrow, leading to difficulty breathing. You have been experiencing shortness of breath and wheezing, especially during outdoor activities. We have discontinued Breztri  due to its cost and started you on Advair (fluticasone /salmeterol) 250/50 mcg, to be taken as 1 puff every 12 hours. Continue using albuterol  as needed. If you encounter any issues with the Advair prescription, please contact our office.  -CHRONIC ANEMIA: Chronic anemia is a condition where you have a lower than normal number of red blood cells, which can cause fatigue and weakness. You are currently receiving iron  infusions and have three more sessions planned. Continue your current treatment under the care of your hematologist.  Follow-up: 6 months with Dr. Wert, unless any issues arise before then.

## 2023-11-09 NOTE — Progress Notes (Addendum)
 @Patient  ID: Alicia [name], female    DOB: 03/21/1946, 77 y.o.   MRN: [id number]  No chief complaint on file.   Referring provider: Corwin Antu, FNP  HPI: 77 year old female, current everyday smoker.  Past medical history significant for bilateral carotid artery stenosis, chronic venous insufficiency, asthmatic bronchitis, emphysema, sleep apnea, GERD, hyperlipidemia, osteoarthritis, iron  deficiency anemia, obesity.  11/09/2023 Patient presents today for OSA follow-up/CPAP compliance.  Discussed the use of AI scribe software for clinical note transcription with the patient, who gave verbal consent to proceed.  History of Present Illness   The patient, with a known diagnosis of sleep apnea, has been using a CPAP machine since 2003. Recently, she received a new CPAP machine and reports 100% compliance with an average use of about seven hours and forty-five minutes per night. The patient notes that the new machine is much better than the previous one, a [employer], which was recalled. The patient uses a full face mask and reports never sleeping without the machine.  In addition to sleep apnea, the patient has chronic anemia and is currently receiving iron  infusions. Despite the anemia, the patient reports being quite active, especially outdoors.  The patient also has a history of asthma and has been using albuterol  two to three times a day, particularly before engaging in outdoor activities. She recently stopped taking Breztri  due to its cost and has not noticed a significant difference in her symptoms. The patient reports experiencing shortness of breath with exertion and wheezing, which can usually be cleared with coughing. The patient was previously on a breathing test that showed reversibility in lung function after using albuterol , indicative of asthma.      Airview download 10/09/2023 - 11/07/2023 Usage days 30/30 days (100%) Average usage 7 hours 45  minutes Pressure 8 to 12 cm H2O (11.9 cm H2O-95%) Air leaks 23.2 L/min (95%) AHI 1.7  Allergies  Allergen Reactions   Amoxicillin  Other (See Comments)    Yeast infections (in high doses)    Immunization History  Administered Date(s) Administered   Fluad Trivalent(High Dose 65+) 10/26/2023   Influenza-Unspecified 09/09/2021   PFIZER(Purple Top)SARS-COV-2 Vaccination 01/19/2020, 02/12/2020    Past Medical History:  Diagnosis Date   Actinic keratosis    Anemia    iron  deficiency   Aneurysmal dilatation (HCC)    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     albuterol  (VENTOLIN  HFA) 108 (90 Base) MCG/ACT inhaler INHALE 1-2 PUFFS INTO THE LUNGS 4 (FOUR) TIMES DAILY AS NEEDED FOR SHORTNESS OF BREATH OR WHEEZING. 18  each 0   aspirin  EC 81 MG tablet Take 81 mg by mouth every other day. Swallow whole.     BREZTRI  AEROSPHERE 160-9-4.8 MCG/ACT AERO INHALE 2 PUFFS INTO THE LUNGS TWICE DAILY. (Patient not taking: Reported on 10/06/2023) 10.7 each 11   Cholecalciferol  (VITAMIN D3) 125 MCG (5000 UT) CAPS Take 5,000 Units by mouth in the morning.     gabapentin  (NEURONTIN ) 300 MG capsule TAKE 1 CAPSULE BY MOUTH THREE TIMES A DAY 270 capsule 1   hydrocortisone  2.5 % cream Apply to rash on face and under breasts once to twice daily as directed. (Patient taking differently: Apply 1 Application topically daily as needed (for dry nose).) 30 g 2   Lutein  20 MG TABS Take 20 mg by mouth in the morning.     Magnesium  400 MG TABS Take 400 mg by mouth at bedtime. Magnesium  Complex     omeprazole  (PRILOSEC) 40 MG capsule TAKE 1 CAPSULE (40 MG TOTAL) BY MOUTH DAILY. 90 capsule 1   Polyvinyl Alcohol -Povidone (REFRESH OP) Place 1 drop into both eyes as needed (dry eye).     pramipexole  (MIRAPEX ) 0.5 MG tablet TAKE 1 TABLET BY MOUTH TWICE A DAY 180 tablet 1   rosuvastatin  (CRESTOR ) 10 MG tablet TAKE 1 TABLET BY MOUTH EVERY DAY 90 tablet 3   venlafaxine  XR (EFFEXOR -XR) 75 MG 24 hr capsule Take 1 capsule (75 mg total) by mouth daily. 90 capsule 3   No facility-administered medications prior to visit.   Review of Systems  Review of Systems  Constitutional: Negative.   HENT: Negative.    Respiratory:  Positive for wheezing.        Dyspnea  Cardiovascular: Negative.    Physical Exam  There were no vitals taken for this visit. Physical Exam Constitutional:      Appearance: Normal appearance.  HENT:     Head: Normocephalic and atraumatic.     Mouth/Throat:     Mouth: Mucous membranes are moist.     Pharynx: Oropharynx is clear.  Cardiovascular:     Rate and Rhythm: Normal rate and regular rhythm.  Pulmonary:     Effort: Pulmonary effort is normal.     Breath sounds: Rhonchi present.  Musculoskeletal:        General:  Normal range of motion.  Skin:    General: Skin is warm and dry.  Neurological:     General: No focal deficit present.     Mental Status: She is alert and oriented to person, place, and time. Mental status is at baseline.  Psychiatric:        Mood and Affect: Mood normal.        Behavior: Behavior normal.        Thought Content: Thought content normal.        Judgment: Judgment normal.      Lab Results:  CBC    Component Value Date/Time   WBC 7.2 10/25/2023 0939   WBC 7.3 08/06/2023 1000   RBC 4.13 10/25/2023 0939   HGB 11.0 (L) 10/25/2023 0939   HCT  34.8 (L) 10/25/2023 0939   PLT 212 10/25/2023 0939   MCV 84.3 10/25/2023 0939   MCH 26.6 10/25/2023 0939   MCHC 31.6 10/25/2023 0939   RDW 14.7 10/25/2023 0939   LYMPHSABS 1.7 10/25/2023 0939   MONOABS 0.5 10/25/2023 0939   EOSABS 0.2 10/25/2023 0939   BASOSABS 0.0 10/25/2023 0939    BMET    Component Value Date/Time   NA 137 08/06/2023 1000   K 4.1 08/06/2023 1000   CL 107 08/06/2023 1000   CO2 23 08/06/2023 1000   GLUCOSE 105 (H) 08/06/2023 1000   BUN 19 08/06/2023 1000   CREATININE 0.96 08/06/2023 1000   CALCIUM  9.0 08/06/2023 1000   GFRNONAA >60 08/06/2023 1000    BNP No results found for: BNP  ProBNP No results found for: PROBNP  Imaging: No results found.   Assessment & Plan:   1. OSA (obstructive sleep apnea) (Primary)  2. Asthmatic bronchitis , chronic (HCC)    Obstructive Sleep Apnea Compliant with CPAP use, with 100% compliance over the past month. Average use of approximately 7 hours, 45 minutes per night. Pressure settings of 8-12, with a residual apnea score of 1.7, indicating well-controlled sleep apnea. Reports benefit from therapy  -Continue current CPAP use nightly 4-6 hours   Asthma Reports shortness of breath with exertion and wheezing, but symptoms improve with rest. Currently using albuterol  2-3 times daily, primarily before outdoor work. Stopped using Breztri  due to  cost. -Discontinue Breztri  -Start Advair (fluticasone /salmeterol) 250/50 mcg, 1 puff every 12 hours. -Continue to use albuterol  as needed. -If issues with Advair prescription, patient to contact office.  Chronic Anemia Currently receiving iron  infusions -Continue current treatment under hematologist's care.  Follow-up in 6 months with Dr. Darlean, unless issues arise.             Alicia LELON Ferrari, NP 11/09/2023

## 2023-11-12 ENCOUNTER — Inpatient Hospital Stay: Payer: PPO | Attending: Oncology

## 2023-11-12 VITALS — BP 118/56 | HR 70 | Temp 98.0°F | Resp 18

## 2023-11-12 DIAGNOSIS — D509 Iron deficiency anemia, unspecified: Secondary | ICD-10-CM | POA: Diagnosis not present

## 2023-11-12 MED ORDER — SODIUM CHLORIDE 0.9% FLUSH
10.0000 mL | Freq: Once | INTRAVENOUS | Status: AC | PRN
Start: 2023-11-12 — End: 2023-11-12
  Administered 2023-11-12: 10 mL
  Filled 2023-11-12: qty 10

## 2023-11-12 MED ORDER — IRON SUCROSE 20 MG/ML IV SOLN
200.0000 mg | Freq: Once | INTRAVENOUS | Status: AC
Start: 2023-11-12 — End: 2023-11-12
  Administered 2023-11-12: 200 mg via INTRAVENOUS
  Filled 2023-11-12: qty 10

## 2023-11-12 NOTE — Telephone Encounter (Signed)
 LVMTCB to schedule appointment.

## 2023-11-12 NOTE — Telephone Encounter (Signed)
 Pt needs a 30-60 day OV for cpap compliance

## 2023-11-15 ENCOUNTER — Inpatient Hospital Stay: Payer: PPO

## 2023-11-17 ENCOUNTER — Inpatient Hospital Stay: Payer: PPO

## 2023-11-17 VITALS — BP 130/63 | HR 83 | Temp 96.5°F | Resp 18

## 2023-11-17 DIAGNOSIS — D509 Iron deficiency anemia, unspecified: Secondary | ICD-10-CM

## 2023-11-17 MED ORDER — IRON SUCROSE 20 MG/ML IV SOLN
200.0000 mg | Freq: Once | INTRAVENOUS | Status: AC
  Administered 2023-11-17: 200 mg via INTRAVENOUS

## 2023-11-17 NOTE — Patient Instructions (Signed)
 Iron Sucrose Injection What is this medication? IRON SUCROSE (EYE ern SOO krose) treats low levels of iron (iron deficiency anemia) in people with kidney disease. Iron is a mineral that plays an important role in making red blood cells, which carry oxygen from your lungs to the rest of your body. This medicine may be used for other purposes; ask your health care provider or pharmacist if you have questions. COMMON BRAND NAME(S): Venofer What should I tell my care team before I take this medication? They need to know if you have any of these conditions: Anemia not caused by low iron levels Heart disease High levels of iron in the blood Kidney disease Liver disease An unusual or allergic reaction to iron, other medications, foods, dyes, or preservatives Pregnant or trying to get pregnant Breastfeeding How should I use this medication? This medication is for infusion into a vein. It is given in a hospital or clinic setting. Talk to your care team about the use of this medication in children. While this medication may be prescribed for children as young as 2 years for selected conditions, precautions do apply. Overdosage: If you think you have taken too much of this medicine contact a poison control center or emergency room at once. NOTE: This medicine is only for you. Do not share this medicine with others. What if I miss a dose? Keep appointments for follow-up doses. It is important not to miss your dose. Call your care team if you are unable to keep an appointment. What may interact with this medication? Do not take this medication with any of the following: Deferoxamine Dimercaprol Other iron products This medication may also interact with the following: Chloramphenicol Deferasirox This list may not describe all possible interactions. Give your health care provider a list of all the medicines, herbs, non-prescription drugs, or dietary supplements you use. Also tell them if you smoke,  drink alcohol, or use illegal drugs. Some items may interact with your medicine. What should I watch for while using this medication? Visit your care team regularly. Tell your care team if your symptoms do not start to get better or if they get worse. You may need blood work done while you are taking this medication. You may need to follow a special diet. Talk to your care team. Foods that contain iron include: whole grains/cereals, dried fruits, beans, or peas, leafy green vegetables, and organ meats (liver, kidney). What side effects may I notice from receiving this medication? Side effects that you should report to your care team as soon as possible: Allergic reactions--skin rash, itching, hives, swelling of the face, lips, tongue, or throat Low blood pressure--dizziness, feeling faint or lightheaded, blurry vision Shortness of breath Side effects that usually do not require medical attention (report to your care team if they continue or are bothersome): Flushing Headache Joint pain Muscle pain Nausea Pain, redness, or irritation at injection site This list may not describe all possible side effects. Call your doctor for medical advice about side effects. You may report side effects to FDA at 1-800-FDA-1088. Where should I keep my medication? This medication is given in a hospital or clinic. It will not be stored at home. NOTE: This sheet is a summary. It may not cover all possible information. If you have questions about this medicine, talk to your doctor, pharmacist, or health care provider.  2024 Elsevier/Gold Standard (2023-04-02 00:00:00)

## 2023-11-24 DIAGNOSIS — H43813 Vitreous degeneration, bilateral: Secondary | ICD-10-CM | POA: Diagnosis not present

## 2023-11-24 DIAGNOSIS — H353114 Nonexudative age-related macular degeneration, right eye, advanced atrophic with subfoveal involvement: Secondary | ICD-10-CM | POA: Diagnosis not present

## 2023-11-24 DIAGNOSIS — H353221 Exudative age-related macular degeneration, left eye, with active choroidal neovascularization: Secondary | ICD-10-CM | POA: Diagnosis not present

## 2023-11-24 DIAGNOSIS — H35423 Microcystoid degeneration of retina, bilateral: Secondary | ICD-10-CM | POA: Diagnosis not present

## 2023-11-24 DIAGNOSIS — H353123 Nonexudative age-related macular degeneration, left eye, advanced atrophic without subfoveal involvement: Secondary | ICD-10-CM | POA: Diagnosis not present

## 2023-11-24 DIAGNOSIS — H353212 Exudative age-related macular degeneration, right eye, with inactive choroidal neovascularization: Secondary | ICD-10-CM | POA: Diagnosis not present

## 2023-12-08 DIAGNOSIS — G4733 Obstructive sleep apnea (adult) (pediatric): Secondary | ICD-10-CM | POA: Diagnosis not present

## 2023-12-15 ENCOUNTER — Ambulatory Visit (INDEPENDENT_AMBULATORY_CARE_PROVIDER_SITE_OTHER): Payer: PPO | Admitting: Physician Assistant

## 2023-12-15 ENCOUNTER — Ambulatory Visit (HOSPITAL_COMMUNITY)
Admission: RE | Admit: 2023-12-15 | Discharge: 2023-12-15 | Disposition: A | Discharge status: Home / Self Care | Payer: PPO | Source: Ambulatory Visit | Attending: Vascular Surgery | Admitting: Vascular Surgery

## 2023-12-15 VITALS — BP 136/82 | HR 79 | Temp 98.0°F | Resp 22 | Ht 60.0 in | Wt 203.1 lb

## 2023-12-15 DIAGNOSIS — K551 Chronic vascular disorders of intestine: Secondary | ICD-10-CM | POA: Diagnosis not present

## 2023-12-15 DIAGNOSIS — Z789 Other specified health status: Secondary | ICD-10-CM | POA: Insufficient documentation

## 2023-12-15 DIAGNOSIS — I7143 Infrarenal abdominal aortic aneurysm, without rupture: Secondary | ICD-10-CM | POA: Diagnosis not present

## 2023-12-15 LAB — VAS US ABI WITH/WO TBI
Left ABI: 0.96
Right ABI: 0.96

## 2023-12-15 NOTE — Progress Notes (Signed)
 Office Note     CC:  follow up Requesting Provider:  Corwin Antu, FNP  HPI: Alicia [name] is a 78 y.o. (June 04, 1946) female who presents for evaluation of bilateral lower extremity circulation.  She is well-known to [facility] with history of celiac artery stenting by Dr. Sheree due to symptoms of chronic mesenteric ischemia.  She continues to be asymptomatic without food fear, postprandial pain, or weight loss.  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  She had a lumbar spine decompression in September 2024.  She has unpredictable pins-and-needles pain in her feet and legs most nights of the week.  She does not have tissue loss and does not have claudication symptoms.  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  She denies any new or changing abdominal pain.  She continues to smoke on a daily basis.   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   R mid forearm, [facility]    Past Surgical History:  Procedure Laterality Date   APPENDECTOMY  10/2016   BICEPT TENODESIS Left 05/18/2023   Procedure: BICEPS TENODESIS;  Surgeon: Addie Cordella Hamilton, MD;   [facility] OR;  Service: Orthopedics;  Laterality: Left;   BREAST BIOPSY Right 2022   CELIAC ARTERY STENT     CHOLECYSTECTOMY  10/2016   COLONOSCOPY     12/2022   ENTEROSCOPY N/A 12/17/2022   Procedure: ENTEROSCOPY;  Surgeon: Federico Rosario BROCKS, MD;  Location: [facility];  Service: Gastroenterology;  Laterality: N/A;  small bowel enteroscopy   EYE SURGERY Bilateral 2017   cataracts removed   HOT HEMOSTASIS  12/17/2022   Procedure: HOT HEMOSTASIS (ARGON PLASMA COAGULATION/BICAP);  Surgeon: Federico Rosario BROCKS, MD;  Location: [facility];  Service: Gastroenterology;;   LUMBAR LAMINECTOMY/DECOMPRESSION MICRODISCECTOMY Left 07/21/2021   Procedure: Laminectomy and Foraminotomy - L4-L5 - left;  Surgeon: Onetha Kuba, MD;  Location: [facility] OR;  Service: Neurosurgery;  Laterality: Left;  3C   LUMBAR LAMINECTOMY/DECOMPRESSION MICRODISCECTOMY Right 08/09/2023   Procedure: Sublaminar decompression - Lumbar Two-Lumbar Three - right;  Surgeon: Onetha Kuba, MD;  Location: [facility] OR;  Service: Neurosurgery;  Laterality: Right;  3C   PAROTID GLAND TUMOR EXCISION Left 2003   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   Procedure: PERIPHERAL VASCULAR INTERVENTION;  Surgeon: Sheree Penne Bruckner, MD;  Location: [facility] INVASIVE CV LAB;  Service: Cardiovascular;;  celiac   REVERSE SHOULDER ARTHROPLASTY Left 05/18/2023   Procedure: LEFT REVERSE SHOULDER ARTHROPLASTY;  Surgeon: Addie Cordella Hamilton, MD;  Location: [facility]  OR;  Service: Orthopedics;  Laterality: Left;   TOTAL SHOULDER ARTHROPLASTY Right 08/06/2022   Procedure: RIGHT REVERSE SHOULDER ARTHROPLASTY;  Surgeon: Addie Cordella Hamilton, MD;  Location: [facility] OR;  Service: Orthopedics;  Laterality: Right;   VISCERAL ANGIOGRAPHY N/A 05/18/2022   Procedure: VISCERAL ANGIOGRAPHY;  Surgeon: Sheree Penne Bruckner, MD;  Location: [facility] INVASIVE CV LAB;  Service: Cardiovascular;  Laterality: N/A;    Social History   Socioeconomic History   Marital status: Widowed    Spouse name: Not  on file   Number of children: 0   Years of education: Not on file   Highest education level: Not on file  Occupational History   Not on file  Tobacco Use   Smoking status: Every Day    Current packs/day: 1.00    Average packs/day: 1 pack/day for 62.0 years (62.0 ttl pk-yrs)    Types: Cigarettes    Passive exposure: Current   Smokeless tobacco: Never   Tobacco comments:    Smokes 1- 1.5 PPD 01/29/2023  Vaping Use   Vaping status: Never Used  Substance and Sexual Activity   Alcohol  use: Not Currently   Drug use: Never   Sexual activity: Not Currently  Other Topics Concern   Not on file  Social History Narrative   Had 1 child, but child is deceased   Social Drivers of Corporate Investment Banker Strain: Low Risk  (09/08/2022)   Overall Financial Resource Strain (CARDIA)    Difficulty of Paying Living Expenses: Not hard at all  Food Insecurity: No Food Insecurity (09/08/2022)   Hunger Vital Sign    Worried About Running Out of Food in the Last Year: Never true    Ran Out of Food in the Last Year: Never true  Transportation Needs: No Transportation Needs (09/08/2022)   PRAPARE - Administrator, Civil Service (Medical): No    Lack of Transportation (Non-Medical): No  Physical Activity: Sufficiently Active (09/08/2022)   Exercise Vital Sign    Days of Exercise per Week: 7 days    Minutes of Exercise per Session: 40 min  Stress: No Stress Concern Present (09/08/2022)   [employer] of Occupational Health - Occupational Stress Questionnaire    Feeling of Stress : Not at all  Social Connections: Unknown (09/08/2022)   Social Connection and Isolation Panel [NHANES]    Frequency of Communication with Friends and Family: More than three times a week    Frequency of Social Gatherings with Friends and Family: More than three times a week    Attends Religious Services: Not on file    Active Member of Clubs or Organizations: Not on file    Attends Tax Inspector Meetings: Not on file    Marital Status: Not on file  Intimate Partner Violence: Not At Risk (09/08/2022)   Humiliation, Afraid, Rape, and Kick questionnaire    Fear of Current or Ex-Partner: No    Emotionally Abused: No    Physically Abused: No    Sexually Abused: No    Family History  Problem Relation Age of Onset   Heart disease Mother    Cerebral aneurysm Father     Current Outpatient Medications  Medication Sig Dispense Refill   acetaminophen  (TYLENOL ) 500 MG tablet Take 1,000 mg by mouth every 6 (six) hours.     albuterol  (VENTOLIN  HFA) 108 (90 Base) MCG/ACT inhaler INHALE 1-2 PUFFS INTO THE LUNGS 4 (FOUR) TIMES DAILY AS NEEDED FOR SHORTNESS OF BREATH OR WHEEZING. 18  each 0   aspirin  EC 81 MG tablet Take 81 mg by mouth every other day. Swallow whole.     Cholecalciferol  (VITAMIN D3) 125 MCG (5000 UT) CAPS Take 5,000 Units by mouth in the morning.     fluticasone -salmeterol (ADVAIR) 250-50 MCG/ACT AEPB Inhale 1 puff into the lungs every 12 (twelve) hours. 60 each 3   gabapentin  (NEURONTIN ) 300 MG capsule TAKE 1 CAPSULE BY MOUTH THREE TIMES A DAY 270 capsule 1   hydrocortisone  2.5 % cream Apply to rash on face and under breasts once to twice daily as directed. (Patient taking differently: Apply 1 Application topically daily as needed (for dry nose).) 30 g 2   Lutein  20 MG TABS Take 20 mg by mouth in the morning.     omeprazole  (PRILOSEC) 40 MG capsule TAKE 1 CAPSULE (40 MG TOTAL) BY MOUTH DAILY. 90 capsule 1   Polyvinyl Alcohol -Povidone (REFRESH OP) Place 1 drop into both eyes as needed (dry eye).     pramipexole  (MIRAPEX ) 0.5 MG tablet TAKE 1 TABLET BY MOUTH TWICE A DAY 180 tablet 1   rosuvastatin  (CRESTOR ) 10 MG tablet TAKE 1 TABLET BY MOUTH EVERY DAY 90 tablet 3   venlafaxine  XR (EFFEXOR -XR) 75 MG 24 hr capsule Take 1 capsule (75 mg total) by mouth daily. 90 capsule 3   No current facility-administered medications for this visit.    Allergies  Allergen  Reactions   Amoxicillin  Other (See Comments)    Yeast infections (in high doses)     REVIEW OF SYSTEMS:   [X]  denotes positive finding, [ ]  denotes negative finding Cardiac  Comments:  Chest pain or chest pressure:    Shortness of breath upon exertion:    Short of breath when lying flat:    Irregular heart rhythm:        Vascular    Pain in calf, thigh, or hip brought on by ambulation:    Pain in feet at night that wakes you up from your sleep:     Blood clot in your veins:    Leg swelling:         Pulmonary    Oxygen at home:    Productive cough:     Wheezing:         Neurologic    Sudden weakness in arms or legs:     Sudden numbness in arms or legs:     Sudden onset of difficulty speaking or slurred speech:    Temporary loss of vision in one eye:     Problems with dizziness:         Gastrointestinal    Blood in stool:     Vomited blood:         Genitourinary    Burning when urinating:     Blood in urine:        Psychiatric    Major depression:         Hematologic    Bleeding problems:    Problems with blood clotting too easily:        Skin    Rashes or ulcers:        Constitutional    Fever or chills:      PHYSICAL EXAMINATION:  Vitals:   12/15/23 1324  BP: 136/82  Pulse: 79  Resp: (!) 22  Temp: 98 F (36.7 C)  TempSrc: Temporal  SpO2: 90%  Weight: 203 lb 1.6 oz (92.1 kg)  Height: 5' (1.524 m)    General:  WDWN  in NAD; vital signs documented above Gait: Not observed HENT: WNL, normocephalic Pulmonary: normal non-labored breathing , without Rales, rhonchi,  wheezing Cardiac: regular HR Abdomen: soft, NT, no masses Skin: without rashes Vascular Exam/Pulses: palpable L DP and R PT Extremities: without ischemic changes, without Gangrene , without cellulitis; without open wounds; superficial veins of the left ankle and foot Musculoskeletal: no muscle wasting or atrophy  Neurologic: A&O X 3 Psychiatric:  The pt has Normal  affect.   Non-Invasive Vascular Imaging:   ABI/TBIToday's ABIToday's TBIPrevious ABIPrevious TBI  +-------+-----------+-----------+------------+------------+  Right 0.96       0.52                                 +-------+-----------+-----------+------------+------------+  Left  0.96       0.63                                 +-------+-----------+-----------+------------+----     ASSESSMENT/PLAN:: 78 y.o. female here for follow up for evaluation of circulation  Ms. Alber is a 78 year old female well-known to [facility] with history of celiac artery stenting.  She continues to be without symptoms of chronic mesenteric ischemia.  She has long history of neuropathic pain in both feet which affects her at night.  She has bilateral ABIs 0.9 with toe pressure of at least 80 mmHg bilaterally.  On exam she has palpable pedal pulses.  She has a history of lumbar spine disease with decompression surgery last fall.  Etiology of discomfort in her legs unlikely related to circulation given palpable pulses on exam as well as adequate ABI/TBI bilaterally.  No indication for further workup from a vascular surgery standpoint at this time.  We will recheck mesenteric duplex and AAA duplex this fall.  She knows to call/return office sooner with any questions or concerns.   Donnice Sender, PA-C [facility] [phone]  Clinic MD:   Sheree

## 2023-12-22 DIAGNOSIS — H35423 Microcystoid degeneration of retina, bilateral: Secondary | ICD-10-CM | POA: Diagnosis not present

## 2023-12-22 DIAGNOSIS — H353123 Nonexudative age-related macular degeneration, left eye, advanced atrophic without subfoveal involvement: Secondary | ICD-10-CM | POA: Diagnosis not present

## 2023-12-22 DIAGNOSIS — H353221 Exudative age-related macular degeneration, left eye, with active choroidal neovascularization: Secondary | ICD-10-CM | POA: Diagnosis not present

## 2023-12-22 DIAGNOSIS — H353212 Exudative age-related macular degeneration, right eye, with inactive choroidal neovascularization: Secondary | ICD-10-CM | POA: Diagnosis not present

## 2023-12-22 DIAGNOSIS — H353114 Nonexudative age-related macular degeneration, right eye, advanced atrophic with subfoveal involvement: Secondary | ICD-10-CM | POA: Diagnosis not present

## 2023-12-22 DIAGNOSIS — H43813 Vitreous degeneration, bilateral: Secondary | ICD-10-CM | POA: Diagnosis not present

## 2024-01-07 DIAGNOSIS — G4733 Obstructive sleep apnea (adult) (pediatric): Secondary | ICD-10-CM | POA: Diagnosis not present

## 2024-01-19 DIAGNOSIS — H43813 Vitreous degeneration, bilateral: Secondary | ICD-10-CM | POA: Diagnosis not present

## 2024-01-19 DIAGNOSIS — H353114 Nonexudative age-related macular degeneration, right eye, advanced atrophic with subfoveal involvement: Secondary | ICD-10-CM | POA: Diagnosis not present

## 2024-01-19 DIAGNOSIS — H35423 Microcystoid degeneration of retina, bilateral: Secondary | ICD-10-CM | POA: Diagnosis not present

## 2024-01-19 DIAGNOSIS — H353221 Exudative age-related macular degeneration, left eye, with active choroidal neovascularization: Secondary | ICD-10-CM | POA: Diagnosis not present

## 2024-01-19 DIAGNOSIS — H353212 Exudative age-related macular degeneration, right eye, with inactive choroidal neovascularization: Secondary | ICD-10-CM | POA: Diagnosis not present

## 2024-01-19 DIAGNOSIS — H353123 Nonexudative age-related macular degeneration, left eye, advanced atrophic without subfoveal involvement: Secondary | ICD-10-CM | POA: Diagnosis not present

## 2024-02-05 DIAGNOSIS — G4733 Obstructive sleep apnea (adult) (pediatric): Secondary | ICD-10-CM | POA: Diagnosis not present

## 2024-02-07 ENCOUNTER — Ambulatory Visit: Payer: PPO | Admitting: Dermatology

## 2024-02-07 ENCOUNTER — Other Ambulatory Visit: Payer: Self-pay | Admitting: Orthopedic Surgery

## 2024-02-16 DIAGNOSIS — H353212 Exudative age-related macular degeneration, right eye, with inactive choroidal neovascularization: Secondary | ICD-10-CM | POA: Diagnosis not present

## 2024-02-16 DIAGNOSIS — H353114 Nonexudative age-related macular degeneration, right eye, advanced atrophic with subfoveal involvement: Secondary | ICD-10-CM | POA: Diagnosis not present

## 2024-02-16 DIAGNOSIS — H353123 Nonexudative age-related macular degeneration, left eye, advanced atrophic without subfoveal involvement: Secondary | ICD-10-CM | POA: Diagnosis not present

## 2024-02-16 DIAGNOSIS — H353221 Exudative age-related macular degeneration, left eye, with active choroidal neovascularization: Secondary | ICD-10-CM | POA: Diagnosis not present

## 2024-02-16 DIAGNOSIS — H35423 Microcystoid degeneration of retina, bilateral: Secondary | ICD-10-CM | POA: Diagnosis not present

## 2024-02-16 DIAGNOSIS — H43813 Vitreous degeneration, bilateral: Secondary | ICD-10-CM | POA: Diagnosis not present

## 2024-02-17 ENCOUNTER — Ambulatory Visit: Admitting: Dermatology

## 2024-02-17 ENCOUNTER — Encounter: Payer: Self-pay | Admitting: Dermatology

## 2024-02-17 DIAGNOSIS — L570 Actinic keratosis: Secondary | ICD-10-CM

## 2024-02-17 DIAGNOSIS — Z1283 Encounter for screening for malignant neoplasm of skin: Secondary | ICD-10-CM

## 2024-02-17 DIAGNOSIS — D1801 Hemangioma of skin and subcutaneous tissue: Secondary | ICD-10-CM

## 2024-02-17 DIAGNOSIS — W908XXA Exposure to other nonionizing radiation, initial encounter: Secondary | ICD-10-CM | POA: Diagnosis not present

## 2024-02-17 DIAGNOSIS — D492 Neoplasm of unspecified behavior of bone, soft tissue, and skin: Secondary | ICD-10-CM

## 2024-02-17 DIAGNOSIS — D485 Neoplasm of uncertain behavior of skin: Secondary | ICD-10-CM

## 2024-02-17 DIAGNOSIS — D225 Melanocytic nevi of trunk: Secondary | ICD-10-CM | POA: Diagnosis not present

## 2024-02-17 DIAGNOSIS — B070 Plantar wart: Secondary | ICD-10-CM

## 2024-02-17 DIAGNOSIS — L814 Other melanin hyperpigmentation: Secondary | ICD-10-CM

## 2024-02-17 DIAGNOSIS — L918 Other hypertrophic disorders of the skin: Secondary | ICD-10-CM | POA: Diagnosis not present

## 2024-02-17 DIAGNOSIS — Z85828 Personal history of other malignant neoplasm of skin: Secondary | ICD-10-CM

## 2024-02-17 DIAGNOSIS — L578 Other skin changes due to chronic exposure to nonionizing radiation: Secondary | ICD-10-CM

## 2024-02-17 DIAGNOSIS — L729 Follicular cyst of the skin and subcutaneous tissue, unspecified: Secondary | ICD-10-CM

## 2024-02-17 DIAGNOSIS — L720 Epidermal cyst: Secondary | ICD-10-CM

## 2024-02-17 DIAGNOSIS — D692 Other nonthrombocytopenic purpura: Secondary | ICD-10-CM

## 2024-02-17 DIAGNOSIS — B079 Viral wart, unspecified: Secondary | ICD-10-CM

## 2024-02-17 DIAGNOSIS — L821 Other seborrheic keratosis: Secondary | ICD-10-CM | POA: Diagnosis not present

## 2024-02-17 DIAGNOSIS — D229 Melanocytic nevi, unspecified: Secondary | ICD-10-CM

## 2024-02-17 NOTE — Progress Notes (Signed)
 Follow-Up Visit   Subjective  Alicia [name] is a 78 y.o. female who presents for the following: Skin Cancer Screening and Full Body Skin Exam  The patient presents for Total-Body Skin Exam (TBSE) for skin cancer screening and mole check. The patient has spots, moles and lesions to be evaluated, some may be new or changing and the patient may have concern these could be cancer.  Hx SCC. Patient used ketoconazole cr under breast for intertrigo but said that the it made the rash spread so she uses neosporin which seems to help. She does have a spot at right side of lip and some under arm that get irritated.   The following portions of the chart were reviewed this encounter and updated as appropriate: medications, allergies, medical history  Review of Systems:  No other skin or systemic complaints except as noted in HPI or Assessment and Plan.  Objective  Well appearing patient in no apparent distress; mood and affect are within normal limits.  A full examination was performed including scalp, head, eyes, ears, nose, lips, neck, chest, axillae, abdomen, back, buttocks, bilateral upper extremities, bilateral lower extremities, hands, feet, fingers, toes, fingernails, and toenails. All findings within normal limits unless otherwise noted below.   Relevant physical exam findings are noted in the Assessment and Plan.  R Lower Vermilion Lip edge x 1, L jaw x 2 (3) Erythematous thin papules/macules with gritty scale.  left chest  8.0 x 5.28mm med dark brown waxy macule   Right Plantar Foot Firm Verrucous papule -- Discussed viral etiology and contagion.   Assessment & Plan   SKIN CANCER SCREENING PERFORMED TODAY.  ACTINIC DAMAGE - Chronic condition, secondary to cumulative UV/sun exposure - diffuse scaly erythematous macules with underlying dyspigmentation - Recommend daily broad spectrum sunscreen SPF 30+ to sun-exposed areas, reapply every 2 hours as needed.  - Staying in the  shade or wearing long sleeves, sun glasses (UVA+UVB protection) and wide brim hats (4-inch brim around the entire circumference of the hat) are also recommended for sun protection.  - Call for new or changing lesions.  LENTIGINES, SEBORRHEIC KERATOSES, HEMANGIOMAS - Benign normal skin lesions - Benign-appearing - Call for any changes  MELANOCYTIC NEVI - Tan-brown and/or pink-flesh-colored symmetric macules and papules - L lat mid back 2.66mm med dark brown macule - R mid upper back 4.46mm speckled brown macule, lighter central - Benign appearing on exam today - Observation - Call clinic for new or changing moles - Recommend daily use of broad spectrum spf 30+ sunscreen to sun-exposed areas.    EPIDERMAL INCLUSION CYST Exam: 4 mm firm blue white papule at right axilla  Benign-appearing. Exam most consistent with an epidermal inclusion cyst. Discussed that a cyst is a benign growth that can grow over time and sometimes get irritated or inflamed. Recommend observation if it is not bothersome. Discussed option of surgical excision to remove it if it is growing, symptomatic, or other changes noted. Please call for new or changing lesions so they can be evaluated.  Purpura - Chronic; persistent and recurrent.  Treatable, but not curable. - Violaceous macules and patches - Benign - Related to trauma, age, sun damage and/or use of blood thinners, chronic use of topical and/or oral steroids - Observe - Can use OTC arnica containing moisturizer such as Dermend Bruise Formula if desired - Call for worsening or other concerns  Acrochordons (Skin Tags) - Fleshy, skin-colored pedunculated papules - Benign appearing.  - Observe. - If desired, they  can be removed with an in office procedure that is not covered by insurance. - Please call the clinic if you notice any new or changing lesions.  HISTORY OF SQUAMOUS CELL CARCINOMA OF THE SKIN - No evidence of recurrence today- R mid forearm -  Recommend regular full body skin exams - Recommend daily broad spectrum sunscreen SPF 30+ to sun-exposed areas, reapply every 2 hours as needed.  - Call if any new or changing lesions are noted between office visits   AK (ACTINIC KERATOSIS) (3) R Lower Vermilion Lip edge x 1, L jaw x 2 (3) Actinic keratoses are precancerous spots that appear secondary to cumulative UV radiation exposure/sun exposure over time. They are chronic with expected duration over 1 year. A portion of actinic keratoses will progress to squamous cell carcinoma of the skin. It is not possible to reliably predict which spots will progress to skin cancer and so treatment is recommended to prevent development of skin cancer.  Recommend daily broad spectrum sunscreen SPF 30+ to sun-exposed areas, reapply every 2 hours as needed.  Recommend staying in the shade or wearing long sleeves, sun glasses (UVA+UVB protection) and wide brim hats (4-inch brim around the entire circumference of the hat). Call for new or changing lesions. Destruction of lesion - R Lower Verm[place] Lip edge x 1, L jaw x 2 (3)  Destruction method: cryotherapy   Informed consent: discussed and consent obtained   Lesion destroyed using liquid nitrogen: Yes   Region frozen until ice ball extended beyond lesion: Yes   Outcome: patient tolerated procedure well with no complications   Post-procedure details: wound care instructions given   Additional details:  Prior to procedure, discussed risks of blister formation, small wound, skin dyspigmentation, or rare scar following cryotherapy. Recommend Vaseline ointment to treated areas while healing.  NEOPLASM OF UNCERTAIN BEHAVIOR OF SKIN left chest Epidermal / dermal shaving  Lesion diameter (cm):  0.8 Informed consent: discussed and consent obtained   Patient was prepped and draped in usual sterile fashion: Area prepped with alcohol. Anesthesia: the lesion was anesthetized in a standard fashion   Anesthetic:   1% lidocaine w/ epinephrine 1-100,000 buffered w/ 8.4% NaHCO3 Instrument used: flexible razor blade   Hemostasis achieved with: pressure, aluminum chloride and electrodesiccation   Outcome: patient tolerated procedure well   Post-procedure details: wound care instructions given   Post-procedure details comment:  Ointment and small bandage applied.  Specimen 1 - Surgical pathology Differential Diagnosis: Lentigo vs SK r/o Atypia  Check Margins: No 8.0 x 5.35mm med dark brown waxy macule VIRAL WARTS, UNSPECIFIED TYPE Right Plantar Foot Viral Wart (HPV) Counseling  Discussed viral / HPV (Human Papilloma Virus) etiology and risk of spread /infectivity to other areas of body as well as to other people.  Multiple treatments and methods may be required to clear warts and it is possible treatment may not be successful.  Treatment risks include discoloration; scarring and there is still potential for wart recurrence.  Patient defers treatment at this time since not bothersome.  Return in about 1 year (around 02/16/2025) for TBSE, with Dr. Roseanne Reno, HxSCC, HxAK, 6 month AK follow up.  Anise Salvo, RMA, am acting as scribe for Willeen Niece, MD .   Documentation: I have reviewed the above documentation for accuracy and completeness, and I agree with the above.  Willeen Niece, MD

## 2024-02-17 NOTE — Patient Instructions (Addendum)
 Wound Care Instructions  Cleanse wound gently with soap and water once a day then pat dry with clean gauze. Apply a thin coat of Petrolatum (petroleum jelly, "Vaseline") over the wound (unless you have an allergy to this). We recommend that you use a new, sterile tube of Vaseline. Do not pick or remove scabs. Do not remove the yellow or white "healing tissue" from the base of the wound.  Cover the wound with fresh, clean, nonstick gauze and secure with paper tape. You may use Band-Aids in place of gauze and tape if the wound is small enough, but would recommend trimming much of the tape off as there is often too much. Sometimes Band-Aids can irritate the skin.  You should call the office for your biopsy report after 1 week if you have not already been contacted.  If you experience any problems, such as abnormal amounts of bleeding, swelling, significant bruising, significant pain, or evidence of infection, please call the office immediately.  FOR ADULT SURGERY PATIENTS: If you need something for pain relief you may take 1 extra strength Tylenol (acetaminophen) AND 2 Ibuprofen (200mg  each) together every 4 hours as needed for pain. (do not take these if you are allergic to them or if you have a reason you should not take them.) Typically, you may only need pain medication for 1 to 3 days.        Cryotherapy Aftercare  Wash gently with soap and water everyday.   Apply Vaseline and Band-Aid daily until healed.    Melanoma ABCDEs  Melanoma is the most dangerous type of skin cancer, and is the leading cause of death from skin disease.  You are more likely to develop melanoma if you: Have light-colored skin, light-colored eyes, or red or blond hair Spend a lot of time in the sun Tan regularly, either outdoors or in a tanning bed Have had blistering sunburns, especially during childhood Have a close family member who has had a melanoma Have atypical moles or large birthmarks  Early  detection of melanoma is key since treatment is typically straightforward and cure rates are extremely high if we catch it early.   The first sign of melanoma is often a change in a mole or a new dark spot.  The ABCDE system is a way of remembering the signs of melanoma.  A for asymmetry:  The two halves do not match. B for border:  The edges of the growth are irregular. C for color:  A mixture of colors are present instead of an even brown color. D for diameter:  Melanomas are usually (but not always) greater than 6mm - the size of a pencil eraser. E for evolution:  The spot keeps changing in size, shape, and color.  Please check your skin once per month between visits. You can use a small mirror in front and a large mirror behind you to keep an eye on the back side or your body.   If you see any new or changing lesions before your next follow-up, please call to schedule a visit.  Please continue daily skin protection including broad spectrum sunscreen SPF 30+ to sun-exposed areas, reapplying every 2 hours as needed when you're outdoors.      Due to recent changes in healthcare laws, you may see results of your pathology and/or laboratory studies on MyChart before the doctors have had a chance to review them. We understand that in some cases there may be results that are confusing or concerning  to you. Please understand that not all results are received at the same time and often the doctors may need to interpret multiple results in order to provide you with the best plan of care or course of treatment. Therefore, we ask that you please give [place] 2 business days to thoroughly review all your results before contacting the office for clarification. Should we see a critical lab result, you will be contacted sooner.   If You Need Anything After Your Visit  If you have any questions or concerns for your doctor, please call our main line at [phone] and press option 4 to reach your doctor's  medical assistant. If no one answers, please leave a voicemail as directed and we will return your call as soon as possible. Messages left after 4 pm will be answered the following business day.   You may also send [place] a message via MyChart. We typically respond to MyChart messages within 1-2 business days.  For prescription refills, please ask your pharmacy to contact our office. Our fax number is [phone].  If you have an urgent issue when the clinic is closed that cannot wait until the next business day, you can page your doctor at the number below.    Please note that while we do our best to be available for urgent issues outside of office hours, we are not available 24/7.   If you have an urgent issue and are unable to reach [place], you may choose to seek medical care at your doctor's office, retail clinic, urgent care center, or emergency room.  If you have a medical emergency, please immediately call 911 or go to the emergency department.  Pager Numbers  - Dr. Gwen Pounds: [phone]  - Dr. Roseanne Reno: [phone]  - Dr. Katrinka Blazing: [phone]   In the event of inclement weather, please call our main line at [phone] for an update on the status of any delays or closures.  Dermatology Medication Tips: Please keep the boxes that topical medications come in in order to help keep track of the instructions about where and how to use these. Pharmacies typically print the medication instructions only on the boxes and not directly on the medication tubes.   If your medication is too expensive, please contact our office at [phone] option 4 or send [place] a message through MyChart.   We are unable to tell what your co-pay for medications will be in advance as this is different depending on your insurance coverage. However, we may be able to find a substitute medication at lower cost or fill out paperwork to get insurance to cover a needed medication.   If a prior authorization is required  to get your medication covered by your insurance company, please allow [place] 1-2 business days to complete this process.  Drug prices often vary depending on where the prescription is filled and some pharmacies may offer cheaper prices.  The website www.goodrx.com contains coupons for medications through different pharmacies. The prices here do not account for what the cost may be with help from insurance (it may be cheaper with your insurance), but the website can give you the price if you did not use any insurance.  - You can print the associated coupon and take it with your prescription to the pharmacy.  - You may also stop by our office during regular business hours and pick up a GoodRx coupon card.  - If you need your prescription sent electronically to a different pharmacy, notify our office through  [facility] MyChart or by phone at [phone] option 4.     Si Usted Necesita Algo Despus de Su Visita  Tambin puede enviarnos un mensaje a travs de Clinical cytogeneticist. Por lo general respondemos a los mensajes de MyChart en el transcurso de 1 a 2 das hbiles.  Para renovar recetas, por favor pida a su farmacia que se ponga en contacto con nuestra oficina. Annie Sable de fax es [place] [phone].  Si tiene un asunto urgente cuando la clnica est cerrada y que no puede esperar hasta el siguiente da hbil, puede llamar/localizar a su doctor(a) al nmero que aparece a continuacin.   Por favor, tenga en cuenta que aunque hacemos todo lo posible para estar disponibles para asuntos urgentes fuera del horario de [place], no estamos disponibles las 24 horas del da, los 7 [address]  [address] de la [place].   Si tiene un problema urgente y no puede comunicarse con nosotros, puede optar por buscar atencin mdica  en el consultorio de su doctor(a), en una clnica privada, en un centro de atencin urgente o en una sala de emergencias.  Si tiene Engineer, drilling, por favor llame inmediatamente al 911 o vaya a la sala  de emergencias.  Nmeros de bper  - Dr. Gwen Pounds: [phone]  - Dra. Roseanne Reno: [phone]  - Dr. Katrinka Blazing: [phone]   En caso de inclemencias del tiempo, por favor llame a Lacy Duverney principal al [phone] para una actualizacin sobre el [place] de cualquier retraso o cierre.  Consejos para la medicacin en dermatologa: Por favor, guarde las cajas en las que vienen los medicamentos de uso tpico para ayudarle a seguir las instrucciones sobre dnde y cmo usarlos. Las farmacias generalmente imprimen las instrucciones del medicamento slo en las cajas y no directamente en los tubos del [place].   Si su medicamento es muy caro, por favor, pngase en contacto con Rolm Gala llamando al [phone] y presione la opcin 4 o envenos un mensaje a travs de Clinical cytogeneticist.   No podemos decirle cul ser su copago por los medicamentos por adelantado ya que esto es diferente dependiendo de la cobertura de su seguro. Sin embargo, es posible que podamos encontrar un medicamento sustituto a Audiological scientist un formulario para que el seguro cubra el medicamento que se considera necesario.   Si se requiere una autorizacin previa para que su compaa de seguros [place] su medicamento, por favor permtanos de 1 a 2 das hbiles para completar [address]  Los precios de los medicamentos varan con frecuencia dependiendo del Environmental consultant de dnde se surte la receta y alguna farmacias pueden ofrecer precios ms baratos.  El sitio web www.goodrx.com tiene cupones para medicamentos de Health and safety inspector. Los precios aqu no tienen en cuenta lo que podra costar con la ayuda del seguro (puede ser ms barato con su seguro), pero el sitio web puede darle el precio si no utiliz Tourist information centre manager.  - Puede imprimir el cupn correspondiente y llevarlo con su receta a la farmacia.  - Tambin puede pasar por nuestra oficina durante el horario de atencin regular y Education officer, museum una tarjeta de cupones de GoodRx.  -  Si necesita que su receta se enve electrnicamente a una farmacia diferente, informe a nuestra oficina a travs de MyChart de [place] o por telfono llamando al [phone] y presione la opcin 4.

## 2024-02-18 ENCOUNTER — Other Ambulatory Visit: Payer: Self-pay | Admitting: *Deleted

## 2024-02-18 DIAGNOSIS — D509 Iron deficiency anemia, unspecified: Secondary | ICD-10-CM

## 2024-02-21 ENCOUNTER — Inpatient Hospital Stay: Payer: PPO | Attending: Oncology

## 2024-02-21 DIAGNOSIS — D509 Iron deficiency anemia, unspecified: Secondary | ICD-10-CM | POA: Diagnosis not present

## 2024-02-21 DIAGNOSIS — F17210 Nicotine dependence, cigarettes, uncomplicated: Secondary | ICD-10-CM | POA: Diagnosis not present

## 2024-02-21 LAB — CBC WITH DIFFERENTIAL/PLATELET
Abs Immature Granulocytes: 0.01 10*3/uL (ref 0.00–0.07)
Basophils Absolute: 0.1 10*3/uL (ref 0.0–0.1)
Basophils Relative: 1 %
Eosinophils Absolute: 0.1 10*3/uL (ref 0.0–0.5)
Eosinophils Relative: 2 %
HCT: 39.8 % (ref 36.0–46.0)
Hemoglobin: 12.7 g/dL (ref 12.0–15.0)
Immature Granulocytes: 0 %
Lymphocytes Relative: 27 %
Lymphs Abs: 1.7 10*3/uL (ref 0.7–4.0)
MCH: 28.5 pg (ref 26.0–34.0)
MCHC: 31.9 g/dL (ref 30.0–36.0)
MCV: 89.2 fL (ref 80.0–100.0)
Monocytes Absolute: 0.5 10*3/uL (ref 0.1–1.0)
Monocytes Relative: 7 %
Neutro Abs: 4 10*3/uL (ref 1.7–7.7)
Neutrophils Relative %: 63 %
Platelets: 168 10*3/uL (ref 150–400)
RBC: 4.46 MIL/uL (ref 3.87–5.11)
RDW: 14.6 % (ref 11.5–15.5)
WBC: 6.4 10*3/uL (ref 4.0–10.5)
nRBC: 0 % (ref 0.0–0.2)

## 2024-02-21 LAB — FERRITIN: Ferritin: 7 ng/mL — ABNORMAL LOW (ref 11–307)

## 2024-02-21 LAB — IRON AND TIBC
Iron: 37 ug/dL (ref 28–170)
Saturation Ratios: 9 % — ABNORMAL LOW (ref 10.4–31.8)
TIBC: 437 ug/dL (ref 250–450)
UIBC: 400 ug/dL

## 2024-02-22 ENCOUNTER — Inpatient Hospital Stay (HOSPITAL_BASED_OUTPATIENT_CLINIC_OR_DEPARTMENT_OTHER): Payer: PPO | Admitting: Oncology

## 2024-02-22 ENCOUNTER — Inpatient Hospital Stay: Payer: PPO

## 2024-02-22 ENCOUNTER — Encounter: Payer: Self-pay | Admitting: Oncology

## 2024-02-22 VITALS — BP 138/64 | HR 61

## 2024-02-22 VITALS — BP 121/83 | HR 73 | Temp 98.4°F | Resp 18 | Ht 60.0 in | Wt 204.6 lb

## 2024-02-22 DIAGNOSIS — D509 Iron deficiency anemia, unspecified: Secondary | ICD-10-CM

## 2024-02-22 MED ORDER — IRON SUCROSE 20 MG/ML IV SOLN
200.0000 mg | Freq: Once | INTRAVENOUS | Status: AC
  Administered 2024-02-22: 200 mg via INTRAVENOUS
  Filled 2024-02-22: qty 10

## 2024-02-22 NOTE — Patient Instructions (Signed)

## 2024-02-22 NOTE — Progress Notes (Signed)
 [facility]  [phone] [phone]  ID: Alicia [name] OB: 11-12-1945  MR#: [id number]  QIO#:[id number]  Patient Care Team: Mort Sawyers, FNP as PCP - General (Family Medicine) Jeralyn Ruths, MD as Consulting Physician (Oncology)  CHIEF COMPLAINT: Iron deficiency anemia.  INTERVAL HISTORY: Patient returns to clinic today for repeat laboratory work, further evaluation, and consideration of additional IV Venofer.  She continues to have fatigue, but states this has significantly improved since receiving her IV iron several months ago.  She has no neurologic complaints.  She denies any recent fevers or illnesses.  She has a good appetite and denies weight loss.  She has no chest pain, shortness of breath, cough, or hemoptysis.  She denies any nausea, vomiting, constipation, or diarrhea.  She has no melena or hematochezia.   She has no urinary complaints.  Patient offers no further specific complaints today.  REVIEW OF SYSTEMS:   Review of Systems  Constitutional:  Positive for malaise/fatigue. Negative for fever and weight loss.  Respiratory: Negative.  Negative for cough, hemoptysis and shortness of breath.   Cardiovascular: Negative.  Negative for chest pain and leg swelling.  Gastrointestinal: Negative.  Negative for abdominal pain, blood in stool, constipation, diarrhea, melena and nausea.  Genitourinary: Negative.  Negative for hematuria.  Musculoskeletal: Negative.  Negative for back pain and joint pain.  Skin: Negative.  Negative for rash.  Neurological: Negative.  Negative for dizziness, focal weakness, weakness and headaches.  Psychiatric/Behavioral: Negative.  The patient is not nervous/anxious.     As per HPI. Otherwise, a complete review of systems is negative.  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   R mid forearm, [facility]    PAST SURGICAL HISTORY: Past Surgical History:  Procedure Laterality Date   APPENDECTOMY  10/2016   BICEPT TENODESIS Left 05/18/2023   Procedure: BICEPS TENODESIS;  Surgeon: Cammy Copa, MD;  Location: [facility] OR;  Service: Orthopedics;  Laterality: Left;   BREAST BIOPSY Right 2022   CELIAC ARTERY STENT     CHOLECYSTECTOMY  10/2016   COLONOSCOPY     12/2022   ENTEROSCOPY N/A 12/17/2022   Procedure: ENTEROSCOPY;  Surgeon: Imogene Burn, MD;  Location: [facility] ENDOSCOPY;  Service: Gastroenterology;  Laterality: N/A;  small bowel enteroscopy   EYE SURGERY Bilateral 2017   cataracts removed   HOT HEMOSTASIS  12/17/2022   Procedure: HOT HEMOSTASIS (ARGON PLASMA COAGULATION/BICAP);  Surgeon: Imogene Burn, MD;  Location: [facility];  Service: Gastroenterology;;   LUMBAR LAMINECTOMY/DECOMPRESSION MICRODISCECTOMY Left 07/21/2021  Procedure: Laminectomy and Foraminotomy - L4-L5 - left;  Surgeon: Gearl Keens, MD;  Location: [facility] OR;  Service: Neurosurgery;  Laterality: Left;  3C   LUMBAR LAMINECTOMY/DECOMPRESSION MICRODISCECTOMY Right 08/09/2023   Procedure: Sublaminar decompression - Lumbar Two-Lumbar Three - right;  Surgeon: Gearl Keens, MD;  Location: [facility] OR;  Service: Neurosurgery;  Laterality: Right;  3C   PAROTID  GLAND TUMOR EXCISION Left 2003   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   Procedure: PERIPHERAL VASCULAR INTERVENTION;  Surgeon: Adine Hoof, MD;  Location: [facility] INVASIVE CV LAB;  Service: Cardiovascular;;  celiac   REVERSE SHOULDER ARTHROPLASTY Left 05/18/2023   Procedure: LEFT REVERSE SHOULDER ARTHROPLASTY;  Surgeon: Jasmine Mesi, MD;  Location: [facility] OR;  Service: Orthopedics;  Laterality: Left;   TOTAL SHOULDER ARTHROPLASTY Right 08/06/2022   Procedure: RIGHT REVERSE SHOULDER ARTHROPLASTY;  Surgeon: Jasmine Mesi, MD;  Location: [facility] OR;  Service: Orthopedics;  Laterality: Right;   VISCERAL ANGIOGRAPHY N/A 05/18/2022   Procedure: VISCERAL ANGIOGRAPHY;  Surgeon: Adine Hoof, MD;  Location: [facility] INVASIVE CV LAB;  Service: Cardiovascular;  Laterality: N/A;    FAMILY HISTORY: Family History  Problem Relation Age of Onset   Heart disease Mother    Cerebral aneurysm Father     ADVANCED DIRECTIVES (Y/N):  N  HEALTH MAINTENANCE: Social History   Tobacco Use   Smoking status: Every Day    Current packs/day: 1.00    Average packs/day: 1 pack/day for 62.0 years (62.0 ttl pk-yrs)    Types: Cigarettes    Passive exposure: Current   Smokeless tobacco: Never   Tobacco comments:    Smokes 1- 1.5 PPD 01/29/2023  Vaping Use   Vaping status: Never Used  Substance Use Topics   Alcohol use: Not Currently   Drug use: Never     Colonoscopy:  PAP:  Bone density:  Lipid panel:  Allergies  Allergen Reactions   Amoxicillin Other (See Comments)    Yeast infections (in high doses)    Current Outpatient Medications  Medication Sig Dispense Refill   acetaminophen (TYLENOL) 500 MG tablet Take 1,000 mg by mouth every 6 (six) hours.     albuterol (VENTOLIN HFA) 108 (90 Base) MCG/ACT inhaler INHALE 1-2 PUFFS INTO THE LUNGS 4 (FOUR) TIMES DAILY AS NEEDED FOR SHORTNESS OF BREATH OR WHEEZING. 18 each 0   aspirin EC 81 MG tablet Take 81 mg by mouth every other  day. Swallow whole.     Cholecalciferol (VITAMIN D3) 125 MCG (5000 UT) CAPS Take 5,000 Units by mouth in the morning.     fluticasone-salmeterol (ADVAIR) 250-50 MCG/ACT AEPB Inhale 1 puff into the lungs every 12 (twelve) hours. 60 each 3   gabapentin (NEURONTIN) 300 MG capsule TAKE 1 CAPSULE BY MOUTH THREE TIMES A DAY 270 capsule 1   hydrocortisone 2.5 % cream Apply to rash on face and under breasts once to twice daily as directed. (Patient taking differently: Apply 1 Application topically daily as needed (for dry nose).) 30 g 2   Lutein 20 MG TABS Take 20 mg by mouth in the morning.     omeprazole (PRILOSEC) 40 MG capsule TAKE 1 CAPSULE (40 MG TOTAL) BY MOUTH DAILY. 90 capsule 1   Polyvinyl Alcohol-Povidone (REFRESH OP) Place 1 drop into both eyes as needed (dry eye).     pramipexole (MIRAPEX) 0.5 MG tablet TAKE 1 TABLET BY MOUTH TWICE A DAY 180 tablet 1   rosuvastatin (CRESTOR) 10 MG tablet TAKE 1 TABLET BY MOUTH EVERY DAY 90  tablet 3   venlafaxine XR (EFFEXOR-XR) 75 MG 24 hr capsule Take 1 capsule (75 mg total) by mouth daily. 90 capsule 3   No current facility-administered medications for this visit.    OBJECTIVE: Vitals:   02/22/24 1045  BP: 121/83  Pulse: 73  Resp: 18  Temp: 98.4 F (36.9 C)  SpO2: 99%     Body mass index is 39.96 kg/m.    ECOG FS:0 - Asymptomatic  General: Well-developed, well-nourished, no acute distress. Eyes: Pink conjunctiva, anicteric sclera. HEENT: Normocephalic, moist mucous membranes. Lungs: No audible wheezing or coughing. Heart: Regular rate and rhythm. Abdomen: Soft, nontender, no obvious distention. Musculoskeletal: No edema, cyanosis, or clubbing. Neuro: Alert, answering all questions appropriately. Cranial nerves grossly intact. Skin: No rashes or petechiae noted. Psych: Normal affect.  LAB RESULTS:  Lab Results  Component Value Date   NA 137 08/06/2023   K 4.1 08/06/2023   CL 107 08/06/2023   CO2 23 08/06/2023   GLUCOSE 105 (H)  08/06/2023   BUN 19 08/06/2023   CREATININE 0.96 08/06/2023   CALCIUM 9.0 08/06/2023   PROT 7.0 12/28/2022   ALBUMIN 4.0 12/28/2022   AST 13 12/28/2022   ALT 9 12/28/2022   ALKPHOS 86 12/28/2022   BILITOT 0.3 12/28/2022   GFRNONAA >60 08/06/2023    Lab Results  Component Value Date   WBC 6.4 02/21/2024   NEUTROABS 4.0 02/21/2024   HGB 12.7 02/21/2024   HCT 39.8 02/21/2024   MCV 89.2 02/21/2024   PLT 168 02/21/2024   Lab Results  Component Value Date   IRON 37 02/21/2024   TIBC 437 02/21/2024   IRONPCTSAT 9 (L) 02/21/2024   Lab Results  Component Value Date   FERRITIN 7 (L) 02/21/2024     STUDIES: No results found.  ASSESSMENT: Iron deficiency anemia.  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  Her most recent luminal evaluation was in February 2024.  Patient's globin has improved and is now within normal limits at 12.7.  Her iron saturation and ferritin levels remain decreased and we will proceed with 200 mg IV Venofer today.  Return later this week for a second infusion.  Patient will then return to clinic in 4 months with repeat laboratory work, further evaluation, and continuation of treatment if needed.       Constipation/nausea: Patient does not complain of this today.  Continue evaluation and treatment per GI. Back pain: Patient does not complain of this today.  Continue follow-up with orthopedics as scheduled.  I spent a total of 30 minutes reviewing chart data, face-to-face evaluation with the patient, counseling and coordination of care as detailed above.    Patient expressed understanding and was in agreement with this plan. She also understands that She can call clinic at any time with any questions, concerns, or complaints.    Shellie Dials, MD   02/22/2024 11:03 AM

## 2024-02-22 NOTE — Progress Notes (Signed)
 Declined 30 minute post-observation. Aware of risks. Vitals stable at discharge.

## 2024-02-23 ENCOUNTER — Telehealth: Payer: Self-pay

## 2024-02-23 LAB — SURGICAL PATHOLOGY

## 2024-02-23 NOTE — Telephone Encounter (Signed)
-----   Message from Artemio Larry sent at 02/23/2024  1:31 PM EDT ----- 1. Skin, left chest :       PIGMENTED SEBORRHEIC KERATOSIS   Benign - please call patient

## 2024-02-23 NOTE — Telephone Encounter (Signed)
 Advised pt of bx results/sh ?

## 2024-02-24 ENCOUNTER — Inpatient Hospital Stay

## 2024-02-24 VITALS — BP 139/70 | HR 63 | Temp 98.4°F | Resp 18

## 2024-02-24 DIAGNOSIS — D509 Iron deficiency anemia, unspecified: Secondary | ICD-10-CM | POA: Diagnosis not present

## 2024-02-24 MED ORDER — IRON SUCROSE 20 MG/ML IV SOLN
200.0000 mg | Freq: Once | INTRAVENOUS | Status: AC
  Administered 2024-02-24: 200 mg via INTRAVENOUS
  Filled 2024-02-24: qty 10

## 2024-02-24 MED ORDER — SODIUM CHLORIDE 0.9% FLUSH
10.0000 mL | Freq: Once | INTRAVENOUS | Status: AC | PRN
  Administered 2024-02-24: 10 mL
  Filled 2024-02-24: qty 10

## 2024-03-07 DIAGNOSIS — G4733 Obstructive sleep apnea (adult) (pediatric): Secondary | ICD-10-CM | POA: Diagnosis not present

## 2024-03-16 ENCOUNTER — Ambulatory Visit (INDEPENDENT_AMBULATORY_CARE_PROVIDER_SITE_OTHER): Admitting: Family

## 2024-03-16 ENCOUNTER — Encounter: Payer: Self-pay | Admitting: Family

## 2024-03-16 VITALS — BP 128/72 | HR 80 | Temp 97.8°F | Ht 60.0 in | Wt 204.8 lb

## 2024-03-16 DIAGNOSIS — H6121 Impacted cerumen, right ear: Secondary | ICD-10-CM | POA: Insufficient documentation

## 2024-03-16 DIAGNOSIS — L304 Erythema intertrigo: Secondary | ICD-10-CM | POA: Insufficient documentation

## 2024-03-16 DIAGNOSIS — J0110 Acute frontal sinusitis, unspecified: Secondary | ICD-10-CM | POA: Diagnosis not present

## 2024-03-16 MED ORDER — DOXYCYCLINE HYCLATE 100 MG PO TABS: 100.0000 mg | ORAL_TABLET | Freq: Two times a day (BID) | ORAL | 0 refills | Status: AC

## 2024-03-16 MED ORDER — NYSTATIN-TRIAMCINOLONE 100000-0.1 UNIT/GM-% EX OINT
1.0000 | TOPICAL_OINTMENT | Freq: Two times a day (BID) | CUTANEOUS | 0 refills | Status: DC
Start: 2024-03-16 — End: 2024-06-12

## 2024-03-16 NOTE — Patient Instructions (Signed)
  Check out [employer] for her on [employer].

## 2024-03-16 NOTE — Progress Notes (Signed)
 Established Patient Office Visit  Subjective:   Patient ID: Alicia [name], female    DOB: 05/11/1946  Age: 78 y.o. MRN: [id number]  CC:  Chief Complaint  Patient presents with   Acute Visit    Feels like her ears might be clogged.    HPI: Alicia [name] is a 78 y.o. female presenting on 03/16/2024 for Acute Visit (Feels like her ears might be clogged.)  C/o both ears with some pain and feeling full.  She thinks it might be earwax She was able to get some wax out of the left side but not the right.  Also with sinus pressure and nasal congestion, ongoing for the last four weeks.  Taking claritin with only mild relief. She did notice a fever two weeks ago but has not had in over a week since then. No sore throat. Slight cough white and thick. Slight wheezing here and there. Using albuterol  inhaler once every other day which is more than typical.  She is on advair as instructed.   She also c/o rash under bil breasts and in her groin area, itchy. Been applying neosporin.      ROS: Negative unless specifically indicated above in HPI.   Relevant past medical history reviewed and updated as indicated.   Allergies and medications reviewed and updated.   Current Outpatient Medications:    acetaminophen  (TYLENOL ) 500 MG tablet, Take 1,000 mg by mouth every 6 (six) hours., Disp: , Rfl:    albuterol  (VENTOLIN  HFA) 108 (90 Base) MCG/ACT inhaler, INHALE 1-2 PUFFS INTO THE LUNGS 4 (FOUR) TIMES DAILY AS NEEDED FOR SHORTNESS OF BREATH OR WHEEZING., Disp: 18 each, Rfl: 0   aspirin  EC 81 MG tablet, Take 81 mg by mouth every other day. Swallow whole., Disp: , Rfl:    Cholecalciferol  (VITAMIN D3) 125 MCG (5000 UT) CAPS, Take 5,000 Units by mouth in the morning., Disp: , Rfl:    doxycycline  (VIBRA -TABS) 100 MG tablet, Take 1 tablet (100 mg total) by mouth 2 (two) times daily for 10 days., Disp: 20 tablet, Rfl: 0   fluticasone -salmeterol (ADVAIR) 250-50 MCG/ACT AEPB, Inhale 1 puff into  the lungs every 12 (twelve) hours., Disp: 60 each, Rfl: 3   gabapentin  (NEURONTIN ) 300 MG capsule, TAKE 1 CAPSULE BY MOUTH THREE TIMES A DAY, Disp: 270 capsule, Rfl: 1   hydrocortisone  2.5 % cream, Apply to rash on face and under breasts once to twice daily as directed. (Patient taking differently: Apply 1 Application topically daily as needed (for dry nose).), Disp: 30 g, Rfl: 2   Lutein  20 MG TABS, Take 20 mg by mouth in the morning., Disp: , Rfl:    nystatin-triamcinolone  ointment (MYCOLOG), Apply 1 Application topically 2 (two) times daily., Disp: 30 g, Rfl: 0   omeprazole  (PRILOSEC) 40 MG capsule, TAKE 1 CAPSULE (40 MG TOTAL) BY MOUTH DAILY., Disp: 90 capsule, Rfl: 1   Polyvinyl Alcohol -Povidone (REFRESH OP), Place 1 drop into both eyes as needed (dry eye)., Disp: , Rfl:    pramipexole  (MIRAPEX ) 0.5 MG tablet, TAKE 1 TABLET BY MOUTH TWICE A DAY, Disp: 180 tablet, Rfl: 1   rosuvastatin  (CRESTOR ) 10 MG tablet, TAKE 1 TABLET BY MOUTH EVERY DAY, Disp: 90 tablet, Rfl: 3   venlafaxine  XR (EFFEXOR -XR) 75 MG 24 hr capsule, Take 1 capsule (75 mg total) by mouth daily., Disp: 90 capsule, Rfl: 3  Allergies  Allergen Reactions   Amoxicillin  Other (See Comments)    Yeast infections (in high doses)  Objective:   BP 128/72 (BP Location: Right Arm, Patient Position: Sitting, Cuff Size: Large)   Pulse 80   Temp 97.8 F (36.6 C) (Temporal)   Ht 5' (1.524 m)   Wt 204 lb 12.8 oz (92.9 kg)   SpO2 96%   BMI 40.00 kg/m    Physical Exam Constitutional:      General: She is not in acute distress.    Appearance: Normal appearance. She is normal weight. She is not ill-appearing, toxic-appearing or diaphoretic.  HENT:     Head: Normocephalic.     Right Ear: Tympanic membrane normal.     Left Ear: A middle ear effusion (clear) is present.     Ears:     Comments: Erythema to bil ear canals.  Visible ear wax impacted right ear After cerumen removal with irrigation clear canals    Nose:     Right  Turbinates: Swollen.     Left Turbinates: Swollen.     Right Sinus: Frontal sinus tenderness present.     Left Sinus: Frontal sinus tenderness present.     Mouth/Throat:     Mouth: Mucous membranes are dry.     Pharynx: No oropharyngeal exudate or posterior oropharyngeal erythema.  Eyes:     Extraocular Movements: Extraocular movements intact.     Pupils: Pupils are equal, round, and reactive to light.  Cardiovascular:     Rate and Rhythm: Normal rate and regular rhythm.     Pulses: Normal pulses.     Heart sounds: Normal heart sounds.  Pulmonary:     Effort: Pulmonary effort is normal.     Breath sounds: Normal breath sounds.  Musculoskeletal:     Cervical back: Normal range of motion.  Skin:    Comments: Erythematic plaques under bil breasts and inguinal folds with satelite papular lesions  Neurological:     General: No focal deficit present.     Mental Status: She is alert and oriented to person, place, and time. Mental status is at baseline.  Psychiatric:        Mood and Affect: Mood normal.        Behavior: Behavior normal.        Thought Content: Thought content normal.        Judgment: Judgment normal.     Assessment & Plan:  Intertrigo -     Nystatin-Triamcinolone ; Apply 1 Application topically 2 (two) times daily.  Dispense: 30 g; Refill: 0  Acute non-recurrent frontal sinusitis Assessment & Plan: RX doxycycline  100 mg po bid x 10 days Pt to continue tylenol  prn sinus pain. Continue with humidifier prn and steam showers recommended as well. instructed If no symptom improvement in 48 hours please f/u  Pt advised: You can take allergy medications such as diphenhydramine, cetirizine, and loratadine with macular degeneration, but be aware of potential ocular side effects   Orders: -     Doxycycline  Hyclate; Take 1 tablet (100 mg total) by mouth 2 (two) times daily for 10 days.  Dispense: 20 tablet; Refill: 0  Impacted cerumen of right ear Assessment &  Plan: Ceruminosis is noted.  Obtained verbal patient consent prior to procedure, possible risks of procedure discussed with pt prior, and then Wax was removed by syringing/irrigation and manual debridement was performed by me with curette. Instructions for home care to prevent wax buildup are given and handout provided to pt .pt tolerated procedure well.        Follow up plan: Return for f/u as scheduled  for CPE in august.  Felicita Horns, FNP

## 2024-03-16 NOTE — Assessment & Plan Note (Signed)
 RX doxycycline  100 mg po bid x 10 days Pt to continue tylenol  prn sinus pain. Continue with humidifier prn and steam showers recommended as well. instructed If no symptom improvement in 48 hours please f/u  Pt advised: You can take allergy medications such as diphenhydramine, cetirizine, and loratadine with macular degeneration, but be aware of potential ocular side effects

## 2024-03-16 NOTE — Assessment & Plan Note (Signed)
Ceruminosis is noted.  Obtained verbal patient consent prior to procedure, possible risks of procedure discussed with pt prior, and then Wax was removed by syringing/irrigation and manual debridement was performed by me with curette. Instructions for home care to prevent wax buildup are given and handout provided to pt .pt tolerated procedure well.   

## 2024-03-17 ENCOUNTER — Other Ambulatory Visit: Payer: Self-pay | Admitting: Family

## 2024-03-17 DIAGNOSIS — Z1231 Encounter for screening mammogram for malignant neoplasm of breast: Secondary | ICD-10-CM

## 2024-03-21 DIAGNOSIS — H353221 Exudative age-related macular degeneration, left eye, with active choroidal neovascularization: Secondary | ICD-10-CM | POA: Diagnosis not present

## 2024-03-21 DIAGNOSIS — H353212 Exudative age-related macular degeneration, right eye, with inactive choroidal neovascularization: Secondary | ICD-10-CM | POA: Diagnosis not present

## 2024-03-21 DIAGNOSIS — H353123 Nonexudative age-related macular degeneration, left eye, advanced atrophic without subfoveal involvement: Secondary | ICD-10-CM | POA: Diagnosis not present

## 2024-03-21 DIAGNOSIS — H43813 Vitreous degeneration, bilateral: Secondary | ICD-10-CM | POA: Diagnosis not present

## 2024-03-21 DIAGNOSIS — H353114 Nonexudative age-related macular degeneration, right eye, advanced atrophic with subfoveal involvement: Secondary | ICD-10-CM | POA: Diagnosis not present

## 2024-03-21 DIAGNOSIS — H35423 Microcystoid degeneration of retina, bilateral: Secondary | ICD-10-CM | POA: Diagnosis not present

## 2024-04-06 DIAGNOSIS — G4733 Obstructive sleep apnea (adult) (pediatric): Secondary | ICD-10-CM | POA: Diagnosis not present

## 2024-04-10 ENCOUNTER — Ambulatory Visit
Admission: RE | Admit: 2024-04-10 | Discharge: 2024-04-10 | Disposition: A | Discharge status: Home / Self Care | Source: Ambulatory Visit | Attending: Family | Admitting: Family

## 2024-04-10 DIAGNOSIS — Z1231 Encounter for screening mammogram for malignant neoplasm of breast: Secondary | ICD-10-CM | POA: Diagnosis not present

## 2024-04-17 ENCOUNTER — Ambulatory Visit: Payer: Self-pay | Admitting: Family

## 2024-04-18 DIAGNOSIS — H353114 Nonexudative age-related macular degeneration, right eye, advanced atrophic with subfoveal involvement: Secondary | ICD-10-CM | POA: Diagnosis not present

## 2024-04-18 DIAGNOSIS — H43813 Vitreous degeneration, bilateral: Secondary | ICD-10-CM | POA: Diagnosis not present

## 2024-04-18 DIAGNOSIS — H353221 Exudative age-related macular degeneration, left eye, with active choroidal neovascularization: Secondary | ICD-10-CM | POA: Diagnosis not present

## 2024-04-18 DIAGNOSIS — H35423 Microcystoid degeneration of retina, bilateral: Secondary | ICD-10-CM | POA: Diagnosis not present

## 2024-04-18 DIAGNOSIS — H353123 Nonexudative age-related macular degeneration, left eye, advanced atrophic without subfoveal involvement: Secondary | ICD-10-CM | POA: Diagnosis not present

## 2024-04-18 DIAGNOSIS — H353212 Exudative age-related macular degeneration, right eye, with inactive choroidal neovascularization: Secondary | ICD-10-CM | POA: Diagnosis not present

## 2024-05-02 ENCOUNTER — Other Ambulatory Visit: Payer: Self-pay | Admitting: Family

## 2024-05-02 ENCOUNTER — Other Ambulatory Visit: Payer: Self-pay | Admitting: Internal Medicine

## 2024-05-02 DIAGNOSIS — K2970 Gastritis, unspecified, without bleeding: Secondary | ICD-10-CM

## 2024-05-07 DIAGNOSIS — G4733 Obstructive sleep apnea (adult) (pediatric): Secondary | ICD-10-CM | POA: Diagnosis not present

## 2024-05-18 DIAGNOSIS — G4733 Obstructive sleep apnea (adult) (pediatric): Secondary | ICD-10-CM | POA: Diagnosis not present

## 2024-05-22 DIAGNOSIS — G4733 Obstructive sleep apnea (adult) (pediatric): Secondary | ICD-10-CM | POA: Diagnosis not present

## 2024-05-24 DIAGNOSIS — H353123 Nonexudative age-related macular degeneration, left eye, advanced atrophic without subfoveal involvement: Secondary | ICD-10-CM | POA: Diagnosis not present

## 2024-05-24 DIAGNOSIS — H353114 Nonexudative age-related macular degeneration, right eye, advanced atrophic with subfoveal involvement: Secondary | ICD-10-CM | POA: Diagnosis not present

## 2024-06-06 DIAGNOSIS — G4733 Obstructive sleep apnea (adult) (pediatric): Secondary | ICD-10-CM | POA: Diagnosis not present

## 2024-06-07 ENCOUNTER — Other Ambulatory Visit: Payer: Self-pay | Admitting: Family

## 2024-06-12 ENCOUNTER — Ambulatory Visit (INDEPENDENT_AMBULATORY_CARE_PROVIDER_SITE_OTHER): Admitting: Family

## 2024-06-12 ENCOUNTER — Encounter: Payer: Self-pay | Admitting: Family

## 2024-06-12 ENCOUNTER — Ambulatory Visit: Payer: Self-pay | Admitting: Family

## 2024-06-12 VITALS — BP 134/80 | HR 80 | Temp 98.2°F | Ht 60.0 in | Wt 209.6 lb

## 2024-06-12 DIAGNOSIS — G2581 Restless legs syndrome: Secondary | ICD-10-CM

## 2024-06-12 DIAGNOSIS — E782 Mixed hyperlipidemia: Secondary | ICD-10-CM | POA: Diagnosis not present

## 2024-06-12 DIAGNOSIS — Z0000 Encounter for general adult medical examination without abnormal findings: Secondary | ICD-10-CM | POA: Diagnosis not present

## 2024-06-12 DIAGNOSIS — D509 Iron deficiency anemia, unspecified: Secondary | ICD-10-CM

## 2024-06-12 DIAGNOSIS — G4733 Obstructive sleep apnea (adult) (pediatric): Secondary | ICD-10-CM | POA: Diagnosis not present

## 2024-06-12 DIAGNOSIS — R911 Solitary pulmonary nodule: Secondary | ICD-10-CM

## 2024-06-12 DIAGNOSIS — Z79899 Other long term (current) drug therapy: Secondary | ICD-10-CM | POA: Diagnosis not present

## 2024-06-12 DIAGNOSIS — M2550 Pain in unspecified joint: Secondary | ICD-10-CM | POA: Diagnosis not present

## 2024-06-12 DIAGNOSIS — Z23 Encounter for immunization: Secondary | ICD-10-CM

## 2024-06-12 DIAGNOSIS — Z780 Asymptomatic menopausal state: Secondary | ICD-10-CM | POA: Insufficient documentation

## 2024-06-12 DIAGNOSIS — L304 Erythema intertrigo: Secondary | ICD-10-CM

## 2024-06-12 DIAGNOSIS — Z720 Tobacco use: Secondary | ICD-10-CM | POA: Diagnosis not present

## 2024-06-12 LAB — FERRITIN: Ferritin: 6.3 ng/mL — ABNORMAL LOW (ref 10.0–291.0)

## 2024-06-12 LAB — LIPID PANEL
Cholesterol: 146 mg/dL (ref 0–200)
HDL: 53.5 mg/dL (ref >39.00)
LDL Cholesterol: 70 mg/dL (ref 0–99)
NonHDL: 92.62
Total CHOL/HDL Ratio: 3
Triglycerides: 114 mg/dL (ref 0.0–149.0)
VLDL: 22.8 mg/dL (ref 0.0–40.0)

## 2024-06-12 LAB — COMPREHENSIVE METABOLIC PANEL WITH GFR
ALT: 8 U/L (ref 0–35)
AST: 14 U/L (ref 0–37)
Albumin: 4.2 g/dL (ref 3.5–5.2)
Alkaline Phosphatase: 84 U/L (ref 39–117)
BUN: 13 mg/dL (ref 6–23)
CO2: 28 meq/L (ref 19–32)
Calcium: 9.5 mg/dL (ref 8.4–10.5)
Chloride: 103 meq/L (ref 96–112)
Creatinine, Ser: 0.66 mg/dL (ref 0.40–1.20)
GFR: 83.87 mL/min (ref >60.00)
Glucose, Bld: 94 mg/dL (ref 70–99)
Potassium: 4.2 meq/L (ref 3.5–5.1)
Sodium: 139 meq/L (ref 135–145)
Total Bilirubin: 0.3 mg/dL (ref 0.2–1.2)
Total Protein: 7.4 g/dL (ref 6.0–8.3)

## 2024-06-12 LAB — CBC WITH DIFFERENTIAL/PLATELET
Basophils Absolute: 0 K/uL (ref 0.0–0.1)
Basophils Relative: 0.5 % (ref 0.0–3.0)
Eosinophils Absolute: 0.2 K/uL (ref 0.0–0.7)
Eosinophils Relative: 3.4 % (ref 0.0–5.0)
HCT: 39.9 % (ref 36.0–46.0)
Hemoglobin: 13.1 g/dL (ref 12.0–15.0)
Lymphocytes Relative: 25.5 % (ref 12.0–46.0)
Lymphs Abs: 1.5 K/uL (ref 0.7–4.0)
MCHC: 32.8 g/dL (ref 30.0–36.0)
MCV: 88.1 fl (ref 78.0–100.0)
Monocytes Absolute: 0.5 K/uL (ref 0.1–1.0)
Monocytes Relative: 7.5 % (ref 3.0–12.0)
Neutro Abs: 3.8 K/uL (ref 1.4–7.7)
Neutrophils Relative %: 63.1 % (ref 43.0–77.0)
Platelets: 165 K/uL (ref 150.0–400.0)
RBC: 4.52 Mil/uL (ref 3.87–5.11)
RDW: 15 % (ref 11.5–15.5)
WBC: 6.1 K/uL (ref 4.0–10.5)

## 2024-06-12 LAB — IBC PANEL
Iron: 35 ug/dL — ABNORMAL LOW (ref 42–145)
Saturation Ratios: 7.6 % — ABNORMAL LOW (ref 20.0–50.0)
TIBC: 457.8 ug/dL — ABNORMAL HIGH (ref 250.0–450.0)
Transferrin: 327 mg/dL (ref 212.0–360.0)

## 2024-06-12 LAB — VITAMIN B12: Vitamin B-12: 263 pg/mL (ref 211–911)

## 2024-06-12 MED ORDER — GABAPENTIN 300 MG PO CAPS: ORAL_CAPSULE | ORAL | 1 refills | Status: DC

## 2024-06-12 MED ORDER — NYSTATIN-TRIAMCINOLONE 100000-0.1 UNIT/GM-% EX OINT
1.0000 | TOPICAL_OINTMENT | Freq: Two times a day (BID) | CUTANEOUS | 1 refills | Status: AC
Start: 2024-06-12 — End: ?

## 2024-06-12 NOTE — Assessment & Plan Note (Signed)
 Compliant and stable.

## 2024-06-12 NOTE — Addendum Note (Signed)
 Addended by: ALBINO SHAVER C on: 06/12/2024 08:46 AM   Modules accepted: Orders

## 2024-06-12 NOTE — Assessment & Plan Note (Addendum)
 Trial without rosuvastatin  x one week to see if symptoms improve some.  If no improvement restart if yes improvement let me know and we will consider alternative options.   Will try increasing dose gabapentin  300 mg in am, 300 mid day and 600 mg at night time.  Discussed sedative precautions.  Continue mirapex  0.5 mg twice daily dosing for RLS b12 ordered to r/o deficiency.  Mirapex  acknowledged typically over 0.5 mg nightly no benefit however pt states very beneficial to her for twice daily dosing.

## 2024-06-12 NOTE — Assessment & Plan Note (Signed)

## 2024-06-12 NOTE — Progress Notes (Signed)
 Subjective:  Patient ID: Alicia [name], female    DOB: 07-Nov-1946  Age: 78 y.o. MRN: [id number]  Patient Care Team: Corwin Antu, FNP as PCP - General (Family Medicine) Jacobo Evalene PARAS, MD as Consulting Physician (Oncology)   CC:  Chief Complaint  Patient presents with   Annual Exam    HPI Alicia [name] is a 78 y.o. female who presents today for an annual physical exam. She reports consuming a general diet. The patient does not participate in regular exercise at present. She generally feels well. She reports sleeping poorly. She does have additional problems to discuss today.   Vision:Within last year Dental exam within last year   Lung Cancer Screening with low-dose Chest CT: smoker since age 59, still currently smoking.   Mammogram: 04/10/24 Colonoscopy: 2023 every seven years Bone density scan: 2022 due for repeat  Pt is with acute concerns.   Sleep apnea: wears a cpap as she is supposed to but she still has some fatigue, she wears it 5-7 hours at a time but with breaks as she is having to get up to walk off the pain. She will go and make dinner or work in the kitchen and have to have a nap. She finds she is needing a few naps a day because she is overly tired. She suffers from arthritic pain which interferes with her sleep as well, she has to take extra strength tylenol  just for some relief. She is on mirapex  0.5 twice daily for RLS and gabapentin  300 mg TID. She is on rosuvastatin . She seems an orthopedist regularly, Dr. Glendia. 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 She does have a copy in the electronic medical record.   DEPRESSION SCREENING    06/12/2024    7:31 AM 08/02/2023   10:58 AM 12/28/2022   10:04 AM 12/28/2022    9:37 AM 09/08/2022   10:42 AM  PHQ 2/9 Scores  PHQ - 2 Score 1 2 0 0 0  PHQ- 9 Score 10 5 5 5       ROS: Negative unless specifically  indicated above in HPI.    Current Outpatient Medications:    acetaminophen  (TYLENOL ) 500 MG tablet, Take 1,000 mg by mouth every 6 (six) hours., Disp: , Rfl:    albuterol  (VENTOLIN  HFA) 108 (90 Base) MCG/ACT inhaler, INHALE 1-2 PUFFS INTO THE LUNGS 4 (FOUR) TIMES DAILY AS NEEDED FOR SHORTNESS OF BREATH OR WHEEZING., Disp: 18 each, Rfl: 0   aspirin  EC 81 MG tablet, Take 81 mg by mouth every other day. Swallow whole., Disp: , Rfl:    Cholecalciferol  (VITAMIN D3) 125 MCG (5000 UT) CAPS, Take 5,000 Units by mouth in the morning., Disp: , Rfl:    fluticasone -salmeterol (ADVAIR) 250-50 MCG/ACT AEPB, Inhale 1 puff into the lungs every 12 (twelve) hours., Disp: 60 each, Rfl: 3   hydrocortisone  2.5 % cream, Apply to rash on face and under breasts once to twice daily as directed. (Patient taking differently: Apply 1 Application topically daily as needed (for dry nose).), Disp: 30 g, Rfl: 2   Lutein  20 MG TABS, Take 20 mg by mouth in the morning., Disp: , Rfl:    omeprazole  (PRILOSEC) 40 MG capsule, TAKE 1 CAPSULE (40 MG TOTAL) BY MOUTH DAILY., Disp: 90 capsule, Rfl: 1   Polyvinyl Alcohol -Povidone (REFRESH OP), Place 1 drop into both eyes as needed (dry eye)., Disp: ,  Rfl:    pramipexole  (MIRAPEX ) 0.5 MG tablet, TAKE 1 TABLET BY MOUTH TWICE A DAY, Disp: 180 tablet, Rfl: 1   rosuvastatin  (CRESTOR ) 10 MG tablet, TAKE 1 TABLET BY MOUTH EVERY DAY, Disp: 90 tablet, Rfl: 3   venlafaxine  XR (EFFEXOR -XR) 75 MG 24 hr capsule, Take 1 capsule (75 mg total) by mouth daily., Disp: 90 capsule, Rfl: 3   gabapentin  (NEURONTIN ) 300 MG capsule, Take one capsule qam, one mid day, and two tablets at night time (total of 600 mg at night time), Disp: 360 capsule, Rfl: 1   nystatin -triamcinolone  ointment (MYCOLOG), Apply 1 Application topically 2 (two) times daily., Disp: 30 g, Rfl: 1    Objective:    BP 134/80 (BP Location: Left Arm, Patient Position: Sitting, Cuff Size: Large)   Pulse 80   Temp 98.2 F (36.8 C) (Temporal)    Ht 5' (1.524 m)   Wt 209 lb 9.6 oz (95.1 kg)   SpO2 94%   BMI 40.93 kg/m   BP Readings from Last 3 Encounters:  06/12/24 134/80  03/16/24 128/72  02/24/24 139/70      Physical Exam Vitals reviewed.  Constitutional:      General: She is not in acute distress.    Appearance: Normal appearance. She is normal weight. She is not ill-appearing.  HENT:     Head: Normocephalic.     Right Ear: Tympanic membrane normal.     Left Ear: Tympanic membrane normal.     Nose: Nose normal.     Mouth/Throat:     Mouth: Mucous membranes are moist.  Eyes:     Extraocular Movements: Extraocular movements intact.     Pupils: Pupils are equal, round, and reactive to light.  Cardiovascular:     Rate and Rhythm: Normal rate and regular rhythm.  Pulmonary:     Effort: Pulmonary effort is normal.     Breath sounds: Normal breath sounds.  Abdominal:     General: Abdomen is flat. Bowel sounds are normal.     Palpations: Abdomen is soft.     Tenderness: There is no guarding or rebound.  Musculoskeletal:        General: Normal range of motion.     Cervical back: Normal range of motion.  Skin:    General: Skin is warm.     Capillary Refill: Capillary refill takes less than 2 seconds.  Neurological:     General: No focal deficit present.     Mental Status: She is alert.  Psychiatric:        Mood and Affect: Mood normal.        Behavior: Behavior normal.        Thought Content: Thought content normal.        Judgment: Judgment normal.          Assessment & Plan:  Post-menopausal -     DG Bone Density; Future  Tobacco abuse Assessment & Plan: Referral to lung cancer screening program. Smoking cessation instruction/counseling given:  counseled patient on the dangers of tobacco use, advised patient to stop smoking, and reviewed strategies to maximize success     Orders: -     Ambulatory Referral for Lung Cancer Scre  Mixed hyperlipidemia Assessment & Plan: Ordered lipid panel,  pending results. Work on low cholesterol diet and exercise as tolerated Continue crestor  as prescribed  Orders: -     Lipid panel  On statin therapy -     Comprehensive metabolic panel with GFR  Encounter  for general adult medical examination without abnormal findings Assessment & Plan: Patient Counseling(The following topics were reviewed):  Preventative care handout given to pt  Health maintenance and immunizations reviewed. Please refer to Health maintenance section. Pt advised on safe sex, wearing seatbelts in car, and proper nutrition labwork ordered today for annual Dental health: Discussed importance of regular tooth brushing, flossing, and dental visits.   Orders: -     Comprehensive metabolic panel with GFR -     Lipid panel  Iron  deficiency anemia, unspecified iron  deficiency anemia type Assessment & Plan: Will obtain labs for hematology today and forward along once received as pt with upcoming appt next week   Orders: -     CBC with Differential/Platelet -     Ferritin -     Iron  and TIBC -     Vitamin B12  Polyarthralgia Assessment & Plan: Trial without rosuvastatin  x one week to see if symptoms improve some.  If no improvement restart if yes improvement let me know and we will consider alternative options.   Will try increasing dose gabapentin  300 mg in am, 300 mid day and 600 mg at night time.  Discussed sedative precautions.  Continue mirapex  0.5 mg twice daily dosing for RLS b12 ordered to r/o deficiency.  Mirapex  acknowledged typically over 0.5 mg nightly no benefit however pt states very beneficial to her for twice daily dosing.    Restless leg syndrome -     Vitamin B12 -     Gabapentin ; Take one capsule qam, one mid day, and two tablets at night time (total of 600 mg at night time)  Dispense: 360 capsule; Refill: 1  Incidental pulmonary nodule, greater than or equal to 8mm -     Ambulatory Referral for Lung Cancer Scre  Intertrigo -      Nystatin -Triamcinolone ; Apply 1 Application topically 2 (two) times daily.  Dispense: 30 g; Refill: 1  Obstructive sleep apnea syndrome Assessment & Plan: Compliant and stable.         Follow-up: Return in about 3 months (around 09/12/2024).   Ginger Patrick, FNP

## 2024-06-12 NOTE — Assessment & Plan Note (Signed)
 Referral to lung cancer screening program. Smoking cessation instruction/counseling given:  counseled patient on the dangers of tobacco use, advised patient to stop smoking, and reviewed strategies to maximize success

## 2024-06-12 NOTE — Assessment & Plan Note (Signed)
 Will obtain labs for hematology today and forward along once received as pt with upcoming appt next week

## 2024-06-12 NOTE — Assessment & Plan Note (Signed)
Ordered lipid panel, pending results. Work on low cholesterol diet and exercise as tolerated Continue crestor as prescribed 

## 2024-06-12 NOTE — Patient Instructions (Addendum)
  I have sent an electronic order over to your preferred location for the following:   []   2D Mammogram  []   3D Mammogram  [x]   Bone Density   Please give this center a call to get scheduled at your convenience.   [x]   [facility]       [address] [place], [place]        [phone]         Make sure to wear two piece  clothing  No lotions powders or deodorants the day of the appointment Make sure to bring picture ID and insurance card.  Bring list of medications you are currently taking including any supplements.    ------------------------------------  Take gabapentin  300 mg in am, 300 mg mid day and two tablets at night time for total of 600 mg at night time   ------------------------------------  Stop cholesterol medication x one week and let me know if there is any joint pain improvement.

## 2024-06-14 ENCOUNTER — Telehealth: Payer: Self-pay | Admitting: Oncology

## 2024-06-14 NOTE — Telephone Encounter (Signed)
 Secure chat from [place] S. That pt had left vm to r/s her appts on 8/11 and 8/12.  Called pt and left vm that I was returning her call and we could r/s the appts. Scheduling phone number given for pt to call back.

## 2024-06-14 NOTE — Progress Notes (Signed)
 noted

## 2024-06-15 ENCOUNTER — Telehealth: Payer: Self-pay | Admitting: *Deleted

## 2024-06-15 NOTE — Telephone Encounter (Signed)
 Patient called in and said that she went to get her blood work for her PCP-Dugal, Francis and did all of the labs that Dr. Babara needed.  So she wants to make sure that the labs that Dr. Corwin did for her makes okay with Dr. Babara.  And if Dr. Babara is good with the labs that she had done then she would cancel the 8/11 appointment because it was just for labs only.  She will, 8/12 to see the doctor as well as if she needs IV iron  to.

## 2024-06-16 ENCOUNTER — Ambulatory Visit: Admitting: Surgical

## 2024-06-19 ENCOUNTER — Inpatient Hospital Stay

## 2024-06-20 ENCOUNTER — Inpatient Hospital Stay: Admitting: Oncology

## 2024-06-20 ENCOUNTER — Encounter: Payer: Self-pay | Admitting: Oncology

## 2024-06-20 ENCOUNTER — Ambulatory Visit: Admitting: Oncology

## 2024-06-20 ENCOUNTER — Inpatient Hospital Stay: Attending: Oncology

## 2024-06-20 VITALS — BP 123/62 | HR 87 | Temp 85.0°F | Resp 16 | Wt 208.0 lb

## 2024-06-20 VITALS — BP 131/70

## 2024-06-20 DIAGNOSIS — D509 Iron deficiency anemia, unspecified: Secondary | ICD-10-CM

## 2024-06-20 DIAGNOSIS — F17210 Nicotine dependence, cigarettes, uncomplicated: Secondary | ICD-10-CM | POA: Insufficient documentation

## 2024-06-20 MED ORDER — IRON SUCROSE 20 MG/ML IV SOLN
200.0000 mg | Freq: Once | INTRAVENOUS | Status: AC
  Administered 2024-06-20 (×2): 200 mg via INTRAVENOUS
  Filled 2024-06-20: qty 10

## 2024-06-20 NOTE — Patient Instructions (Signed)

## 2024-06-20 NOTE — Progress Notes (Signed)
 [facility]  [phone] [phone]  ID: Alicia [name] OB: 01-28-1946  MR#: [id number]  RDW#:[id number]  Patient Care Team: Corwin Antu, FNP as PCP - General (Family Medicine) Jacobo Evalene PARAS, MD as Consulting Physician (Oncology)  CHIEF COMPLAINT: Iron  deficiency anemia.  INTERVAL HISTORY: Patient returns to clinic today for repeat laboratory work, further evaluation, and consideration of additional IV Venofer .  She continues to have chronic fatigue, but otherwise feels well.  She has no neurologic complaints.  She denies any recent fevers or illnesses.  She has a good appetite and denies weight loss.  She has no chest pain, shortness of breath, cough, or hemoptysis.  She denies any nausea, vomiting, constipation, or diarrhea.  She has no melena or hematochezia.   She has no urinary complaints.  Patient offers no further specific complaints today.  REVIEW OF SYSTEMS:   Review of Systems  Constitutional:  Positive for malaise/fatigue. Negative for fever and weight loss.  Respiratory: Negative.  Negative for cough, hemoptysis and shortness of breath.   Cardiovascular: Negative.  Negative for chest pain and leg swelling.  Gastrointestinal: Negative.  Negative for abdominal pain, blood in stool, constipation, diarrhea, melena and nausea.  Genitourinary: Negative.  Negative for hematuria.  Musculoskeletal: Negative.  Negative for back pain and joint pain.  Skin: Negative.  Negative for rash.  Neurological: Negative.  Negative for dizziness, focal weakness, weakness and headaches.  Psychiatric/Behavioral: Negative.  The patient is not nervous/anxious.     As per HPI. Otherwise, a complete review of systems is negative.  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   R mid forearm, [facility]    PAST SURGICAL HISTORY: Past Surgical History:  Procedure Laterality Date   APPENDECTOMY  10/2016   BICEPT TENODESIS Left 05/18/2023   Procedure: BICEPS TENODESIS;  Surgeon: Addie Cordella Hamilton, MD;  Location: [facility] OR;  Service: Orthopedics;  Laterality: Left;   BREAST BIOPSY Right 2022   CELIAC ARTERY STENT     CHOLECYSTECTOMY  10/2016   COLONOSCOPY     12/2022   ENTEROSCOPY N/A 12/17/2022   Procedure: ENTEROSCOPY;  Surgeon: Federico Rosario BROCKS, MD;  Location: [facility];  Service: Gastroenterology;  Laterality: N/A;  small bowel enteroscopy   EYE SURGERY Bilateral 2017   cataracts removed   HOT HEMOSTASIS  12/17/2022   Procedure: HOT HEMOSTASIS (ARGON PLASMA COAGULATION/BICAP);  Surgeon: Federico Rosario BROCKS, MD;  Location: [facility];  Service: Gastroenterology;;   LUMBAR LAMINECTOMY/DECOMPRESSION MICRODISCECTOMY Left 07/21/2021   Procedure: Laminectomy and Foraminotomy - L4-L5 - left;  Surgeon: Onetha Kuba, MD;  Location: [facility] OR;  Service: Neurosurgery;  Laterality: Left;  3C   LUMBAR LAMINECTOMY/DECOMPRESSION MICRODISCECTOMY Right 08/09/2023   Procedure: Sublaminar decompression - Lumbar Two-Lumbar Three - right;  Surgeon: Onetha Kuba, MD;  Location: [facility] OR;  Service: Neurosurgery;  Laterality: Right;  3C   PAROTID GLAND TUMOR EXCISION Left 2003   PERIPHERAL VASCULAR  INTERVENTION  05/18/2022   Procedure: PERIPHERAL VASCULAR INTERVENTION;  Surgeon: Sheree Penne Bruckner, MD;  Location: [facility] INVASIVE CV LAB;  Service: Cardiovascular;;  celiac   REVERSE SHOULDER ARTHROPLASTY Left 05/18/2023   Procedure: LEFT REVERSE SHOULDER ARTHROPLASTY;  Surgeon: Addie Cordella Hamilton, MD;  [facility] OR;  Service: Orthopedics;  Laterality: Left;   TOTAL SHOULDER ARTHROPLASTY Right 08/06/2022   Procedure: RIGHT REVERSE SHOULDER ARTHROPLASTY;  Surgeon: Addie Cordella Hamilton, MD;  Location: [facility] OR;  Service: Orthopedics;  Laterality: Right;   VISCERAL ANGIOGRAPHY N/A 05/18/2022   Procedure: VISCERAL ANGIOGRAPHY;  Surgeon: Sheree Penne Bruckner, MD;  Location: [facility] INVASIVE CV LAB;  Service: Cardiovascular;  Laterality: N/A;    FAMILY HISTORY: Family History  Problem Relation Age of Onset   Heart disease Mother    Cerebral aneurysm Father     ADVANCED DIRECTIVES (Y/N):  N  HEALTH MAINTENANCE: Social History   Tobacco Use   Smoking status: Every Day    Current packs/day: 1.00    Average packs/day: 1 pack/day for 62.0 years (62.0 ttl pk-yrs)    Types: Cigarettes    Passive exposure: Current   Smokeless tobacco: Never   Tobacco comments:    Smokes 1- 1.5 PPD 01/29/2023  Vaping Use   Vaping status: Never Used  Substance Use Topics   Alcohol  use: Not Currently   Drug use: Never     Colonoscopy:  PAP:  Bone density:  Lipid panel:  Allergies  Allergen Reactions   Amoxicillin  Other (See Comments)    Yeast infections (in high doses)   Atorvastatin Other (See Comments)    myalgias    Current Outpatient Medications  Medication Sig Dispense Refill   acetaminophen  (TYLENOL ) 500 MG tablet Take 1,000 mg by mouth every 6 (six) hours.     albuterol  (VENTOLIN  HFA) 108 (90 Base) MCG/ACT inhaler INHALE 1-2 PUFFS INTO THE LUNGS 4 (FOUR) TIMES DAILY AS NEEDED FOR SHORTNESS OF BREATH OR WHEEZING. 18 each 0   aspirin  EC 81 MG tablet Take 81 mg by mouth every other day.  Swallow whole.     Cholecalciferol  (VITAMIN D3) 125 MCG (5000 UT) CAPS Take 5,000 Units by mouth in the morning.     fluticasone -salmeterol (ADVAIR) 250-50 MCG/ACT AEPB Inhale 1 puff into the lungs every 12 (twelve) hours. 60 each 3   gabapentin  (NEURONTIN ) 300 MG capsule Take one capsule qam, one mid day, and two tablets at night time (total of 600 mg at night time) 360 capsule 1   hydrocortisone  2.5 % cream Apply to rash on face and under breasts once to twice daily as directed. (Patient taking differently: Apply 1 Application topically daily as needed (for dry nose).) 30 g 2   Lutein  20 MG TABS Take 20 mg by mouth in the morning.     nystatin -triamcinolone  ointment (MYCOLOG) Apply 1 Application topically 2 (two) times daily. 30 g 1   omeprazole  (PRILOSEC) 40 MG capsule TAKE 1 CAPSULE (40 MG TOTAL) BY MOUTH DAILY. 90 capsule 1   Polyvinyl Alcohol -Povidone (REFRESH OP) Place 1 drop into both eyes as needed (dry eye).     pramipexole  (MIRAPEX ) 0.5  MG tablet TAKE 1 TABLET BY MOUTH TWICE A DAY 180 tablet 1   venlafaxine  XR (EFFEXOR -XR) 75 MG 24 hr capsule Take 1 capsule (75 mg total) by mouth daily. 90 capsule 3   rosuvastatin  (CRESTOR ) 10 MG tablet TAKE 1 TABLET BY MOUTH EVERY DAY (Patient not taking: Reported on 06/20/2024) 90 tablet 3   No current facility-administered medications for this visit.    OBJECTIVE: Vitals:   06/20/24 1052  BP: 123/62  Pulse: 87  Resp: 16  Temp: (!) 85 F (29.4 C)  SpO2: 97%     Body mass index is 40.62 kg/m.    ECOG FS:0 - Asymptomatic  General: Well-developed, well-nourished, no acute distress. Eyes: Pink conjunctiva, anicteric sclera. HEENT: Normocephalic, moist mucous membranes. Lungs: No audible wheezing or coughing. Heart: Regular rate and rhythm. Abdomen: Soft, nontender, no obvious distention. Musculoskeletal: No edema, cyanosis, or clubbing. Neuro: Alert, answering all questions appropriately. Cranial nerves grossly intact. Skin: No rashes or  petechiae noted. Psych: Normal affect.  LAB RESULTS:  Lab Results  Component Value Date   NA 139 06/12/2024   K 4.2 06/12/2024   CL 103 06/12/2024   CO2 28 06/12/2024   GLUCOSE 94 06/12/2024   BUN 13 06/12/2024   CREATININE 0.66 06/12/2024   CALCIUM  9.5 06/12/2024   PROT 7.4 06/12/2024   ALBUMIN  4.2 06/12/2024   AST 14 06/12/2024   ALT 8 06/12/2024   ALKPHOS 84 06/12/2024   BILITOT 0.3 06/12/2024   GFRNONAA >60 08/06/2023    Lab Results  Component Value Date   WBC 6.1 06/12/2024   NEUTROABS 3.8 06/12/2024   HGB 13.1 06/12/2024   HCT 39.9 06/12/2024   MCV 88.1 06/12/2024   PLT 165.0 06/12/2024   Lab Results  Component Value Date   IRON  35 (L) 06/12/2024   TIBC 457.8 (H) 06/12/2024   IRONPCTSAT 7.6 (L) 06/12/2024   Lab Results  Component Value Date   FERRITIN 6.3 (L) 06/12/2024     STUDIES: No results found.  ASSESSMENT: Iron  deficiency anemia.  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  Her most recent luminal evaluation was in February 2024.  Patient's hemoglobin continues to be within normal limits, her iron  stores have declined and she is symptomatic.  Proceed with 200 mg IV Venofer  today.  Return to clinic 3 times over the next 1 to 2 weeks for additional treatment.  Patient will then return to clinic in 3 months with repeat laboratory work, further evaluation, and consideration of additional treatment if necessary.    Constipation/nausea: Patient does not complain of this today.  Continue evaluation and treatment per GI. Back pain: Patient does not complain of this today.  Continue follow-up with orthopedics as scheduled.  I spent a total of 30 minutes reviewing chart data, face-to-face evaluation with the patient, counseling and coordination of care as detailed above.     Patient expressed understanding and was in agreement with this plan. She also understands that She can call  clinic at any time with any questions, concerns, or complaints.    Evalene JINNY Reusing, MD   06/21/2024 6:40 AM

## 2024-06-21 ENCOUNTER — Encounter: Payer: Self-pay | Admitting: Oncology

## 2024-06-22 ENCOUNTER — Ambulatory Visit

## 2024-06-22 DIAGNOSIS — H353231 Exudative age-related macular degeneration, bilateral, with active choroidal neovascularization: Secondary | ICD-10-CM | POA: Diagnosis not present

## 2024-06-22 DIAGNOSIS — H04123 Dry eye syndrome of bilateral lacrimal glands: Secondary | ICD-10-CM | POA: Diagnosis not present

## 2024-06-22 DIAGNOSIS — Z961 Presence of intraocular lens: Secondary | ICD-10-CM | POA: Diagnosis not present

## 2024-06-23 ENCOUNTER — Telehealth: Payer: Self-pay | Admitting: Family

## 2024-06-23 DIAGNOSIS — H353212 Exudative age-related macular degeneration, right eye, with inactive choroidal neovascularization: Secondary | ICD-10-CM | POA: Diagnosis not present

## 2024-06-23 DIAGNOSIS — E782 Mixed hyperlipidemia: Secondary | ICD-10-CM

## 2024-06-23 DIAGNOSIS — H353221 Exudative age-related macular degeneration, left eye, with active choroidal neovascularization: Secondary | ICD-10-CM | POA: Diagnosis not present

## 2024-06-23 DIAGNOSIS — G4733 Obstructive sleep apnea (adult) (pediatric): Secondary | ICD-10-CM | POA: Diagnosis not present

## 2024-06-23 MED ORDER — LOVASTATIN 20 MG PO TABS: 20.0000 mg | ORAL_TABLET | Freq: Every day | ORAL | 3 refills | Status: AC

## 2024-06-23 NOTE — Telephone Encounter (Signed)
 Glad to hear.  She is tolerating gabapentin  well in that she is not to fatigued or drowsy?   So stopping the crestor  did help muscle /joint pains?  That's good.  Let's try one more called lovastatin  since you do still have a lot of risk factors for heart attack/stroke so I don't want to neglect the cholesterol. If you start with similar symptoms on this then d/c and then let me know because you have to fail 3 to qualify for something called repatha which doesn't typically cause these same symptoms.   Take every other day this time so that hopefully it doesn't cause same effects.

## 2024-06-23 NOTE — Telephone Encounter (Signed)
 LM for pt to return call.

## 2024-06-23 NOTE — Addendum Note (Signed)
 Addended by: CORWIN ANTU on: 06/23/2024 10:45 AM   Modules accepted: Orders

## 2024-06-23 NOTE — Telephone Encounter (Signed)
 Copied from CRM #[id number]. Topic: Clinical - Medical Advice >> Jun 23, 2024  8:47 AM Mercedes MATSU wrote: Reason for CRM: Patient called in stating she was placed on a new medication and was informed to call in to let the doctor know how she is doing on the medication. Patient states that she upped the Gabapentin  and cancelled the Crestor , and that seems to be working much better. Patient states she will continue this regimen and if NP Dugal wants her to change it, she can be reached [phone].

## 2024-06-26 ENCOUNTER — Inpatient Hospital Stay

## 2024-06-26 ENCOUNTER — Telehealth: Payer: Self-pay

## 2024-06-26 VITALS — BP 142/75 | HR 71 | Temp 98.1°F | Resp 18

## 2024-06-26 DIAGNOSIS — D509 Iron deficiency anemia, unspecified: Secondary | ICD-10-CM | POA: Diagnosis not present

## 2024-06-26 MED ORDER — IRON SUCROSE 20 MG/ML IV SOLN
200.0000 mg | Freq: Once | INTRAVENOUS | Status: AC
  Administered 2024-06-26: 200 mg via INTRAVENOUS
  Filled 2024-06-26: qty 10

## 2024-06-26 NOTE — Telephone Encounter (Signed)
 LM for pt to return call.

## 2024-06-26 NOTE — Patient Instructions (Signed)

## 2024-06-26 NOTE — Telephone Encounter (Signed)
 Copied from CRM #[id number]. Topic: General - Other >> Jun 26, 2024  1:55 PM Mercedes MATSU wrote: Reason for CRM: Patient wants to inform NP Dugal that she is home and can be reached at [phone] if needed.

## 2024-06-26 NOTE — Telephone Encounter (Signed)
 Copied from CRM #[id number]. Topic: General - Other >> Jun 26, 2024  7:59 AM Macario HERO wrote: Reason for CRM: Patient returning call from [place]. Called [facility]; no response. Advised Manuelita will return her call. Call back number: [phone] (requesting to call her back at this number if calling before 11:30.)

## 2024-06-27 NOTE — Telephone Encounter (Signed)
 LM for pt to return call.

## 2024-06-28 ENCOUNTER — Inpatient Hospital Stay

## 2024-06-28 VITALS — BP 116/67 | HR 82 | Temp 98.2°F | Resp 18

## 2024-06-28 DIAGNOSIS — D509 Iron deficiency anemia, unspecified: Secondary | ICD-10-CM

## 2024-06-28 MED ORDER — IRON SUCROSE 20 MG/ML IV SOLN
200.0000 mg | Freq: Once | INTRAVENOUS | Status: AC
Start: 2024-06-28 — End: 2024-06-28
  Administered 2024-06-28: 200 mg via INTRAVENOUS
  Filled 2024-06-28: qty 10

## 2024-06-28 NOTE — Patient Instructions (Signed)

## 2024-07-03 ENCOUNTER — Inpatient Hospital Stay

## 2024-07-03 VITALS — BP 122/58 | HR 95 | Temp 98.6°F | Resp 18

## 2024-07-03 DIAGNOSIS — D509 Iron deficiency anemia, unspecified: Secondary | ICD-10-CM | POA: Diagnosis not present

## 2024-07-03 MED ORDER — IRON SUCROSE 20 MG/ML IV SOLN
200.0000 mg | Freq: Once | INTRAVENOUS | Status: AC
  Administered 2024-07-03: 200 mg via INTRAVENOUS
  Filled 2024-07-03: qty 10

## 2024-07-03 NOTE — Patient Instructions (Signed)

## 2024-07-07 DIAGNOSIS — G4733 Obstructive sleep apnea (adult) (pediatric): Secondary | ICD-10-CM | POA: Diagnosis not present

## 2024-07-21 DIAGNOSIS — H353221 Exudative age-related macular degeneration, left eye, with active choroidal neovascularization: Secondary | ICD-10-CM | POA: Diagnosis not present

## 2024-07-21 DIAGNOSIS — H353123 Nonexudative age-related macular degeneration, left eye, advanced atrophic without subfoveal involvement: Secondary | ICD-10-CM | POA: Diagnosis not present

## 2024-07-21 DIAGNOSIS — H35423 Microcystoid degeneration of retina, bilateral: Secondary | ICD-10-CM | POA: Diagnosis not present

## 2024-07-21 DIAGNOSIS — H353212 Exudative age-related macular degeneration, right eye, with inactive choroidal neovascularization: Secondary | ICD-10-CM | POA: Diagnosis not present

## 2024-07-21 DIAGNOSIS — H353114 Nonexudative age-related macular degeneration, right eye, advanced atrophic with subfoveal involvement: Secondary | ICD-10-CM | POA: Diagnosis not present

## 2024-07-21 DIAGNOSIS — H43813 Vitreous degeneration, bilateral: Secondary | ICD-10-CM | POA: Diagnosis not present

## 2024-07-28 ENCOUNTER — Other Ambulatory Visit: Payer: Self-pay

## 2024-07-28 ENCOUNTER — Other Ambulatory Visit (INDEPENDENT_AMBULATORY_CARE_PROVIDER_SITE_OTHER): Payer: Self-pay

## 2024-07-28 ENCOUNTER — Ambulatory Visit (INDEPENDENT_AMBULATORY_CARE_PROVIDER_SITE_OTHER): Admitting: Surgical

## 2024-07-28 DIAGNOSIS — M25522 Pain in left elbow: Secondary | ICD-10-CM

## 2024-07-28 DIAGNOSIS — M19022 Primary osteoarthritis, left elbow: Secondary | ICD-10-CM | POA: Diagnosis not present

## 2024-07-28 DIAGNOSIS — M65331 Trigger finger, right middle finger: Secondary | ICD-10-CM

## 2024-07-28 DIAGNOSIS — M65332 Trigger finger, left middle finger: Secondary | ICD-10-CM

## 2024-07-28 MED ORDER — AMOXICILLIN 500 MG PO TABS: 2000.0000 mg | ORAL_TABLET | Freq: Once | ORAL | 0 refills | Status: DC

## 2024-07-28 MED ORDER — AMOXICILLIN 500 MG PO TABS: 2000.0000 mg | ORAL_TABLET | Freq: Once | ORAL | 0 refills | Status: AC

## 2024-07-28 NOTE — Progress Notes (Signed)
 Us/

## 2024-07-29 ENCOUNTER — Encounter: Payer: Self-pay | Admitting: Surgical

## 2024-07-29 MED ORDER — LIDOCAINE HCL 1 % IJ SOLN
3.0000 mL | INTRAMUSCULAR | Status: AC | PRN
  Administered 2024-07-28: 3 mL

## 2024-07-29 MED ORDER — TRIAMCINOLONE ACETONIDE 40 MG/ML IJ SUSP
13.0000 mg | INTRAMUSCULAR | Status: AC | PRN
  Administered 2024-07-28: 13 mg

## 2024-07-29 MED ORDER — BUPIVACAINE HCL 0.25 % IJ SOLN
0.6600 mL | INTRAMUSCULAR | Status: AC | PRN
  Administered 2024-07-28: .66 mL

## 2024-07-29 NOTE — Progress Notes (Signed)
 Office Visit Note   Patient: Alicia [name]           Date of Birth: 1946/04/26           MRN: [id number] Visit Date: 07/28/2024 Requested by: Corwin Antu, FNP [address] [place],  [place] [address] PCP: Corwin Antu, FNP  Subjective: Chief Complaint  Patient presents with   Left Elbow - Pain   Right Hand - Pain   Left Hand - Pain    HPI: Alicia [name] is a 78 y.o. female who presents to the office reporting multiple trigger fingers and left elbow pain.  She has history of trigger fingers and most recently has been noticing trigger fingers in both of her long fingers.  Regarding the left elbow pain, she states that pain has been present for about 4 months without history of injury.  She describes a deep pain in the bone.  It will keep her awake at night and cause rest pain.  Pain throbs and she notes intermittent swelling.  Takes Tylenol .  Uses cane in her left hand.  She is right-hand dominant.  She denies any stiffness or decreased range of motion.  Denies any personal history of cancer..                ROS: All systems reviewed are negative as they relate to the chief complaint within the history of present illness.  Patient denies fevers or chills.  Assessment & Plan: Visit Diagnoses:  1. Pain in left elbow   2. Trigger finger, right middle finger   3. Trigger finger, left middle finger     Plan: Impression is 78 year old female who has multiple trigger fingers.  She would like to have these injected which historically have given her good relief.  Left and right long fingers were injected in the region of the A1 pulley with care taken to avoid direct injection into the tendon itself and instead inject into the tendon sheath of the flexor tendon.  Patient tolerated procedures well without complication.  Regarding her elbow pain, she does have some mild to moderate degenerative changes of her left elbow.  Patient historically has a high pain tolerance and  the rest/nighttime pain that is present does not really correlate with the amount of degenerative changes noted on radiographs.  With long duration of pain and worsening symptoms, as well as the presence of lytic region in the proximal radius, plan for MRI of the left elbow with and without contrast to further evaluate this lytic lesion.  Follow-up after MRI to review results.  Follow-Up Instructions: No follow-ups on file.   Orders:  Orders Placed This Encounter  Procedures   XR Elbow 2 Views Left   US  Guided Needle Placement - No Linked Charges   MR ELBOW LEFT W WO CONTRAST   Meds ordered this encounter  Medications   DISCONTD: amoxicillin  (AMOXIL ) 500 MG tablet    Sig: Take 4 tablets (2,000 mg total) by mouth once for 1 dose. Take 30-60 mins prior to dental appointment.    Dispense:  4 tablet    Refill:  0   amoxicillin  (AMOXIL ) 500 MG tablet    Sig: Take 4 tablets (2,000 mg total) by mouth once for 1 dose. Take 30-60 mins prior to dental appointment.    Dispense:  4 tablet    Refill:  0      Procedures: Hand/UE Inj: bilateral long A1 for trigger finger on 07/28/2024 3:59 PM  Indications: therapeutic and pain Details: 25 G needle, ultrasound-guided volar approach Medications (Right): 3 mL lidocaine  1 %; 0.66 mL bupivacaine  0.25 %; 13 mg triamcinolone  acetonide 40 MG/ML Medications (Left): 3 mL lidocaine  1 %; 0.66 mL bupivacaine  0.25 %; 13 mg triamcinolone  acetonide 40 MG/ML Outcome: tolerated well, no immediate complications Procedure, treatment alternatives, risks and benefits explained, specific risks discussed. Consent was given by the patient. Immediately prior to procedure a time out was called to verify the correct patient, procedure, equipment, support staff and site/side marked as required. Patient was prepped and draped in the usual sterile fashion.       Clinical Data: No additional findings.  Objective: Vital Signs: There were no vitals taken for this  visit.  Physical Exam:  Constitutional: Patient appears well-developed HEENT:  Head: Normocephalic Eyes:EOM are normal Neck: Normal range of motion Cardiovascular: Normal rate Pulmonary/chest: Effort normal Neurologic: Patient is alert Skin: Skin is warm Psychiatric: Patient has normal mood and affect  Ortho Exam: Ortho exam demonstrates left elbow with 0 degrees extension and 110 degrees of elbow flexion.  Intact EPL, FPL, finger abduction, pronation/supination, bicep, tricep.  Negative hook test of distal bicep tendon.  No cellulitis or skin changes.  She has tenderness primarily in the elbow joint line as well as through the proximal aspect of the radius.  No pain reproduced with supination/pronation passively.  Has some minimal pain with passive extension and flexion.  Negative elbow flexion test.  No Tinel sign over the ulnar nerve.  2+ radial pulse of the left upper extremity.  Triggering observed over the left and right long fingers.  There is more tenderness over the right long finger than there is over the left.  Specialty Comments:  EXAM: MRI LUMBAR SPINE WITHOUT CONTRAST   TECHNIQUE: Multiplanar, multisequence MR imaging of the lumbar spine was performed. No intravenous contrast was administered.   COMPARISON:  MRI lumbar spine 02/23/2021, correlation is also made with lumbar spine radiographs 03/02/2023   FINDINGS: Segmentation: Partial sacralization of L5. The most inferior disc space is labeled L5-S1, consistent with the prior exams.   Alignment: Scoliosis, with overall levoscoliosis of the lumbar spine. Straightening of the normal lumbar lordosis. Trace retrolisthesis of L1 on L2. Trace anterolisthesis of L5 on S1. previously noted trace anterolisthesis of L3 on L4 is no longer appreciated.   Vertebrae: No acute or subacute fracture, evidence of discitis, or suspicious osseous lesion. Vertebral body heights appear unchanged compared to 02/23/2021 endplate  degenerative changes at L2-L3 and L3-L4, eccentric to the right and L4-L5, eccentric to the left, likely related to the scoliosis.   Conus medullaris and cauda equina: Conus extends to the L1 level. Conus and cauda equina appear normal.   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 No lymphadenopathy. Atrophy of the inferior paraspinous muscles.   Disc levels:   T11-T12: Seen only on the sagittal images. Minimal disc bulge. No spinal canal stenosis or neural foraminal narrowing.   T12-L1: Minimal disc bulge. Mild facet arthropathy. No spinal canal stenosis or neural foraminal narrowing.   L1-L2: Trace retrolisthesis and mild disc bulge. Mild facet arthropathy. No spinal canal stenosis. Mild right neural foraminal narrowing, which is new from the prior exam.   L2-L3: Disc height loss with likely lateral listhesis at this level, with right eccentric disc osteophyte complex. Left foraminal disc protrusion. Moderate right and mild left facet arthropathy. Narrowing of the right lateral recess. No  spinal canal stenosis. Severe right neural foraminal narrowing, which has progressed from the prior exam.   L3-L4: Mild disc bulge, eccentric to the right. Moderate right and mild left facet arthropathy. Narrowing of the lateral recesses. No spinal canal stenosis. Moderate to severe right neural foraminal narrowing, which has progressed from prior exam.   L4-L5: Disc height loss, with left eccentric disc bulge. Moderate facet arthropathy. Narrowing of the left lateral recess. No spinal canal stenosis. Severe left and mild right neural foraminal narrowing, similar to the prior exam.   L5-S1: Grade 1 anterolisthesis with disc unroofing. Severe facet arthropathy. No spinal canal stenosis. Severe left and moderate right neural foraminal narrowing, similar to prior.   IMPRESSION: 1. No acute or subacute  fracture. 2. L2-L3 severe right neural foraminal narrowing. 3. L3-L4 moderate to severe right neural foraminal narrowing. 4. L4-L5 severe left and mild right neural foraminal narrowing. 5. L5-S1 severe left and moderate right neural foraminal narrowing. 6. Narrowing of the lateral recesses on the right at L2-L3, bilaterally at L3-L4, and on the left at L4-L5, which could affect the descending L3, L4, and L5 nerve roots, respectively.     Electronically Signed   By: Donald Campion M.D.   On: 03/25/2023 14:54  Imaging: No results found.   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 05/18/2023   Class 2 severe obesity due to excess calories with serious comorbidity and body mass index (BMI) of 37.0 to 37.9 in adult (HCC) 12/28/2022   On statin therapy 12/28/2022   S/P reverse total shoulder arthroplasty, right 08/06/2022   Secondary osteoarthritis of right shoulder due to rotator cuff tear 06/16/2022   Seborrheic dermatitis 06/16/2022   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/2022   Centrilobular emphysema (HCC) 02/13/2022   Cigarette smoker 02/06/2022   Asthmatic bronchitis , chronic (HCC) 02/05/2022   Incidental pulmonary nodule, greater than or equal to 8mm 01/21/2022   Bilateral carotid artery stenosis 12/26/2021   Iron  deficiency anemia 12/26/2021   Mixed hyperlipidemia 12/26/2021   Chronic cough 12/26/2021   Adjustment reaction with anxiety and depression 12/26/2021   Chronic venous insufficiency 02/29/2020   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/05/2019   Arthritis of shoulder 06/08/2018   GERD (gastroesophageal reflux disease) 06/08/2018   Macular degeneration  06/08/2018   Sleep apnea 06/08/2018   Past Medical History:  Diagnosis Date   Actinic keratosis    Anemia    iron  deficiency   Aneurysmal dilatation (HCC)    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, will need biopsy- 05/2023   Neuropathy    bilateral feet/legs   Peripheral arterial disease (HCC)    Peripheral vascular disease (HCC)    Mesenteric Artery Stent Placed 2023   Pneumonia    Positive fecal occult blood test 01/19/2022   Sleep  apnea    wears cpap   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   R mid forearm, EDC    Family History  Problem Relation Age of Onset   Heart disease Mother    Cerebral aneurysm Father     Past Surgical History:  Procedure Laterality Date   APPENDECTOMY  10/2016   BICEPT TENODESIS Left 05/18/2023   Procedure: BICEPS TENODESIS;  Surgeon: Addie Cordella Hamilton, MD;  [facility] OR;  Service: Orthopedics;  Laterality: Left;   BREAST BIOPSY Right 2022   CELIAC ARTERY STENT     CHOLECYSTECTOMY  10/2016   COLONOSCOPY     12/2022   ENTEROSCOPY N/A 12/17/2022   Procedure: ENTEROSCOPY;  Surgeon: Federico Rosario BROCKS, MD;  Location: [facility];  Service: Gastroenterology;  Laterality: N/A;  small bowel enteroscopy   EYE SURGERY Bilateral 2017   cataracts removed   HOT HEMOSTASIS  12/17/2022   Procedure: HOT HEMOSTASIS (ARGON PLASMA COAGULATION/BICAP);  Surgeon: Federico Rosario BROCKS, MD;  Location: [facility];  Service: Gastroenterology;;   LUMBAR LAMINECTOMY/DECOMPRESSION MICRODISCECTOMY Left 07/21/2021   Procedure: Laminectomy and Foraminotomy - L4-L5 - left;  Surgeon: Onetha Kuba, MD;  Location: [facility] OR;  Service: Neurosurgery;   Laterality: Left;  3C   LUMBAR LAMINECTOMY/DECOMPRESSION MICRODISCECTOMY Right 08/09/2023   Procedure: Sublaminar decompression - Lumbar Two-Lumbar Three - right;  Surgeon: Onetha Kuba, MD;  Location: [facility] OR;  Service: Neurosurgery;  Laterality: Right;  3C   PAROTID GLAND TUMOR EXCISION Left 2003   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   Procedure: PERIPHERAL VASCULAR INTERVENTION;  Surgeon: Sheree Penne Bruckner, MD;  Location: [facility] INVASIVE CV LAB;  Service: Cardiovascular;;  celiac   REVERSE SHOULDER ARTHROPLASTY Left 05/18/2023   Procedure: LEFT REVERSE SHOULDER ARTHROPLASTY;  Surgeon: Addie Cordella Hamilton, MD;  Location: [facility] OR;  Service: Orthopedics;  Laterality: Left;   TOTAL SHOULDER ARTHROPLASTY Right 08/06/2022   Procedure: RIGHT REVERSE SHOULDER ARTHROPLASTY;  Surgeon: Addie Cordella Hamilton, MD;  Location: [facility] OR;  Service: Orthopedics;  Laterality: Right;   VISCERAL ANGIOGRAPHY N/A 05/18/2022   Procedure: VISCERAL ANGIOGRAPHY;  Surgeon: Sheree Penne Bruckner, MD;  Location: [facility] INVASIVE CV LAB;  Service: Cardiovascular;  Laterality: N/A;   Social History   Occupational History   Not on file  Tobacco Use   Smoking status: Every Day    Current packs/day: 1.00    Average packs/day: 1 pack/day for 62.0 years (62.0 ttl pk-yrs)    Types: Cigarettes    Passive exposure: Current   Smokeless tobacco: Never   Tobacco comments:    Smokes 1- 1.5 PPD 01/29/2023  Vaping Use   Vaping status: Never Used  Substance and Sexual Activity   Alcohol  use: Not Currently   Drug use: Never   Sexual activity: Not Currently

## 2024-08-03 ENCOUNTER — Telehealth: Payer: Self-pay | Admitting: Surgical

## 2024-08-03 NOTE — Telephone Encounter (Signed)
 Pt called stating she is unable to get MRI until 3 months due to having iron  infusions. FYI to [place]. Pt phone number is [phone].

## 2024-08-05 ENCOUNTER — Other Ambulatory Visit: Payer: Self-pay | Admitting: Family

## 2024-08-05 DIAGNOSIS — F4323 Adjustment disorder with mixed anxiety and depressed mood: Secondary | ICD-10-CM

## 2024-08-07 ENCOUNTER — Other Ambulatory Visit: Payer: Self-pay | Admitting: Orthopedic Surgery

## 2024-08-07 DIAGNOSIS — G4733 Obstructive sleep apnea (adult) (pediatric): Secondary | ICD-10-CM | POA: Diagnosis not present

## 2024-08-07 DIAGNOSIS — G2581 Restless legs syndrome: Secondary | ICD-10-CM

## 2024-08-18 DIAGNOSIS — H353212 Exudative age-related macular degeneration, right eye, with inactive choroidal neovascularization: Secondary | ICD-10-CM | POA: Diagnosis not present

## 2024-08-18 DIAGNOSIS — H353221 Exudative age-related macular degeneration, left eye, with active choroidal neovascularization: Secondary | ICD-10-CM | POA: Diagnosis not present

## 2024-08-28 ENCOUNTER — Ambulatory Visit (INDEPENDENT_AMBULATORY_CARE_PROVIDER_SITE_OTHER): Admitting: Dermatology

## 2024-08-28 ENCOUNTER — Encounter: Payer: Self-pay | Admitting: Dermatology

## 2024-08-28 DIAGNOSIS — L821 Other seborrheic keratosis: Secondary | ICD-10-CM | POA: Diagnosis not present

## 2024-08-28 DIAGNOSIS — L820 Inflamed seborrheic keratosis: Secondary | ICD-10-CM

## 2024-08-28 DIAGNOSIS — W908XXA Exposure to other nonionizing radiation, initial encounter: Secondary | ICD-10-CM | POA: Diagnosis not present

## 2024-08-28 DIAGNOSIS — L578 Other skin changes due to chronic exposure to nonionizing radiation: Secondary | ICD-10-CM | POA: Diagnosis not present

## 2024-08-28 DIAGNOSIS — L570 Actinic keratosis: Secondary | ICD-10-CM | POA: Diagnosis not present

## 2024-08-28 DIAGNOSIS — Z85828 Personal history of other malignant neoplasm of skin: Secondary | ICD-10-CM

## 2024-08-28 NOTE — Patient Instructions (Addendum)

## 2024-08-28 NOTE — Progress Notes (Signed)
 Follow-Up Visit   Subjective  Alicia [name] is a 78 y.o. female who presents for the following: AK 73m f/u face, check spot R arm- irritated   The following portions of the chart were reviewed this encounter and updated as appropriate: medications, allergies, medical history  Review of Systems:  No other skin or systemic complaints except as noted in HPI or Assessment and Plan.  Objective  Well appearing patient in no apparent distress; mood and affect are within normal limits.   A focused examination was performed of the following areas: face  Relevant exam findings are noted in the Assessment and Plan.  R elbow x 1 Stuck on waxy papule with erythema R hand dorsum x 1, L mandible x 1 (2) Pink scaly macules  Assessment & Plan   ACTINIC DAMAGE - chronic, secondary to cumulative UV radiation exposure/sun exposure over time - diffuse scaly erythematous macules with underlying dyspigmentation - Recommend daily broad spectrum sunscreen SPF 30+ to sun-exposed areas, reapply every 2 hours as needed.  - Recommend staying in the shade or wearing long sleeves, sun glasses (UVA+UVB protection) and wide brim hats (4-inch brim around the entire circumference of the hat). - Call for new or changing lesions.   SEBORRHEIC KERATOSIS face - Stuck-on, waxy, tan-brown papules and/or plaques  - Benign-appearing - Discussed benign etiology and prognosis. - Observe - Call for any changes  HISTORY OF SQUAMOUS CELL CARCINOMA OF THE SKIN - No evidence of recurrence today- R mid forearm  - Recommend regular full body skin exams - Recommend daily broad spectrum sunscreen SPF 30+ to sun-exposed areas, reapply every 2 hours as needed.  - Call if any new or changing lesions are noted between office visits  INFLAMED SEBORRHEIC KERATOSIS R elbow x 1 Symptomatic, irritating, patient would like treated. Destruction of lesion - R elbow x 1  Destruction method: cryotherapy   Informed  consent: discussed and consent obtained   Lesion destroyed using liquid nitrogen: Yes   Region frozen until ice ball extended beyond lesion: Yes   Outcome: patient tolerated procedure well with no complications   Post-procedure details: wound care instructions given   Additional details:  Prior to procedure, discussed risks of blister formation, small wound, skin dyspigmentation, or rare scar following cryotherapy. Recommend Vaseline ointment to treated areas while healing.   AK (ACTINIC KERATOSIS) (2) R hand dorsum x 1, L mandible x 1 (2) Actinic keratoses are precancerous spots that appear secondary to cumulative UV radiation exposure/sun exposure over time. They are chronic with expected duration over 1 year. A portion of actinic keratoses will progress to squamous cell carcinoma of the skin. It is not possible to reliably predict which spots will progress to skin cancer and so treatment is recommended to prevent development of skin cancer.  Recommend daily broad spectrum sunscreen SPF 30+ to sun-exposed areas, reapply every 2 hours as needed.  Recommend staying in the shade or wearing long sleeves, sun glasses (UVA+UVB protection) and wide brim hats (4-inch brim around the entire circumference of the hat). Call for new or changing lesions. Destruction of lesion - R hand dorsum x 1, L mandible x 1 (2)  Destruction method: cryotherapy   Informed consent: discussed and consent obtained   Lesion destroyed using liquid nitrogen: Yes   Region frozen until ice ball extended beyond lesion: Yes   Outcome: patient tolerated procedure well with no complications   Post-procedure details: wound care instructions given   Additional details:  Prior to procedure,  discussed risks of blister formation, small wound, skin dyspigmentation, or rare scar following cryotherapy. Recommend Vaseline ointment to treated areas while healing.     Return for as scheduled for TBSE, Hx of SCC, Hx of AKs.  I, Grayce Saunas, RMA, am acting as scribe for Rexene Rattler, MD .   Documentation: I have reviewed the above documentation for accuracy and completeness, and I agree with the above.  Rexene Rattler, MD

## 2024-09-08 ENCOUNTER — Encounter: Payer: Self-pay | Admitting: Oncology

## 2024-09-11 ENCOUNTER — Encounter: Payer: Self-pay | Admitting: Radiology

## 2024-09-19 DIAGNOSIS — H353221 Exudative age-related macular degeneration, left eye, with active choroidal neovascularization: Secondary | ICD-10-CM | POA: Diagnosis not present

## 2024-09-19 DIAGNOSIS — H353212 Exudative age-related macular degeneration, right eye, with inactive choroidal neovascularization: Secondary | ICD-10-CM | POA: Diagnosis not present

## 2024-09-19 DIAGNOSIS — H35423 Microcystoid degeneration of retina, bilateral: Secondary | ICD-10-CM | POA: Diagnosis not present

## 2024-09-19 DIAGNOSIS — H353114 Nonexudative age-related macular degeneration, right eye, advanced atrophic with subfoveal involvement: Secondary | ICD-10-CM | POA: Diagnosis not present

## 2024-09-19 DIAGNOSIS — H353123 Nonexudative age-related macular degeneration, left eye, advanced atrophic without subfoveal involvement: Secondary | ICD-10-CM | POA: Diagnosis not present

## 2024-09-19 DIAGNOSIS — H43813 Vitreous degeneration, bilateral: Secondary | ICD-10-CM | POA: Diagnosis not present

## 2024-09-20 ENCOUNTER — Inpatient Hospital Stay

## 2024-09-21 ENCOUNTER — Inpatient Hospital Stay: Admitting: Oncology

## 2024-09-21 ENCOUNTER — Telehealth: Payer: Self-pay | Admitting: Orthopedic Surgery

## 2024-09-21 ENCOUNTER — Inpatient Hospital Stay

## 2024-09-21 NOTE — Telephone Encounter (Signed)
 Pt states the bone on her right hip is hurting and not sure what to do. Please call pt about this matter at [phone].

## 2024-09-22 NOTE — Telephone Encounter (Signed)
 I talked to the pt and she stated she is having groin pain but most of her pain is more toward her lateral hip. Per pt on the bone she wanted to see if you would be willing to work her in next week to be seen for this?

## 2024-09-22 NOTE — Telephone Encounter (Signed)
 Lvm for pt to cb to work in

## 2024-09-22 NOTE — Telephone Encounter (Signed)
 That is okay.  I can see her next week whenever is good

## 2024-09-25 ENCOUNTER — Telehealth: Payer: Self-pay | Admitting: Orthopedic Surgery

## 2024-09-25 NOTE — Telephone Encounter (Signed)
 Pt called stating she does not need appt. She is feeling better. Pt states if she need us  she will call and make an appt. No call back needed

## 2024-09-25 NOTE — Telephone Encounter (Signed)
 Left another message.

## 2024-10-02 ENCOUNTER — Inpatient Hospital Stay
Admission: RE | Admit: 2024-10-02 | Discharge: 2024-10-02 | Disposition: A | Source: Ambulatory Visit | Attending: Surgical

## 2024-10-02 DIAGNOSIS — M25522 Pain in left elbow: Secondary | ICD-10-CM

## 2024-10-02 DIAGNOSIS — M67922 Unspecified disorder of synovium and tendon, left upper arm: Secondary | ICD-10-CM | POA: Diagnosis not present

## 2024-10-02 MED ORDER — GADOPICLENOL 0.5 MMOL/ML IV SOLN
10.0000 mL | Freq: Once | INTRAVENOUS | Status: AC | PRN
  Administered 2024-10-02: 10 mL via INTRAVENOUS

## 2024-10-10 ENCOUNTER — Inpatient Hospital Stay: Attending: Oncology

## 2024-10-10 DIAGNOSIS — F17210 Nicotine dependence, cigarettes, uncomplicated: Secondary | ICD-10-CM | POA: Insufficient documentation

## 2024-10-10 DIAGNOSIS — D509 Iron deficiency anemia, unspecified: Secondary | ICD-10-CM | POA: Insufficient documentation

## 2024-10-10 LAB — CBC WITH DIFFERENTIAL/PLATELET
Abs Immature Granulocytes: 0.01 K/uL (ref 0.00–0.07)
Basophils Absolute: 0 K/uL (ref 0.0–0.1)
Basophils Relative: 1 %
Eosinophils Absolute: 0.2 K/uL (ref 0.0–0.5)
Eosinophils Relative: 4 %
HCT: 38.1 % (ref 36.0–46.0)
Hemoglobin: 12.3 g/dL (ref 12.0–15.0)
Immature Granulocytes: 0 %
Lymphocytes Relative: 29 %
Lymphs Abs: 1.5 K/uL (ref 0.7–4.0)
MCH: 29.4 pg (ref 26.0–34.0)
MCHC: 32.3 g/dL (ref 30.0–36.0)
MCV: 91.1 fL (ref 80.0–100.0)
Monocytes Absolute: 0.4 K/uL (ref 0.1–1.0)
Monocytes Relative: 7 %
Neutro Abs: 3.2 K/uL (ref 1.7–7.7)
Neutrophils Relative %: 59 %
Platelets: 191 K/uL (ref 150–400)
RBC: 4.18 MIL/uL (ref 3.87–5.11)
RDW: 14.8 % (ref 11.5–15.5)
WBC: 5.3 K/uL (ref 4.0–10.5)
nRBC: 0 % (ref 0.0–0.2)

## 2024-10-10 LAB — IRON AND TIBC
Iron: 41 ug/dL (ref 28–170)
Saturation Ratios: 10 % — ABNORMAL LOW (ref 10.4–31.8)
TIBC: 421 ug/dL (ref 250–450)
UIBC: 380 ug/dL

## 2024-10-10 LAB — FERRITIN: Ferritin: 19 ng/mL (ref 11–307)

## 2024-10-11 ENCOUNTER — Inpatient Hospital Stay: Admitting: Oncology

## 2024-10-11 ENCOUNTER — Inpatient Hospital Stay

## 2024-10-11 ENCOUNTER — Encounter: Payer: Self-pay | Admitting: Oncology

## 2024-10-11 VITALS — BP 138/67

## 2024-10-11 VITALS — BP 129/67 | HR 82 | Temp 98.0°F | Resp 16 | Ht 60.0 in | Wt 208.0 lb

## 2024-10-11 DIAGNOSIS — D509 Iron deficiency anemia, unspecified: Secondary | ICD-10-CM | POA: Diagnosis not present

## 2024-10-11 MED ORDER — IRON SUCROSE 20 MG/ML IV SOLN
200.0000 mg | Freq: Once | INTRAVENOUS | Status: AC
  Administered 2024-10-11: 200 mg via INTRAVENOUS
  Filled 2024-10-11: qty 10

## 2024-10-11 NOTE — Progress Notes (Unsigned)
 [facility]  [phone] [phone]  ID: Alicia [name] OB: [birth year]  MR#: [id number]  RDW#:[id number]  Patient Care Team: Corwin Antu, FNP as PCP - General (Family Medicine) Jacobo Evalene PARAS, MD as Consulting Physician (Oncology)  CHIEF COMPLAINT: Iron  deficiency anemia.  INTERVAL HISTORY: Patient returns to clinic today for repeat laboratory work, further evaluation, and consideration of additional IV Venofer .  She continues to have fatigue, but otherwise feels well.  She has no neurologic complaints.  She denies any recent fevers or illnesses.  She has a good appetite and denies weight loss.  She has no chest pain, shortness of breath, cough, or hemoptysis.  She denies any nausea, vomiting, constipation, or diarrhea.  She has no melena or hematochezia.   She has no urinary complaints.  Patient offers no further specific complaints today.  REVIEW OF SYSTEMS:   Review of Systems  Constitutional:  Positive for malaise/fatigue. Negative for fever and weight loss.  Respiratory: Negative.  Negative for cough, hemoptysis and shortness of breath.   Cardiovascular: Negative.  Negative for chest pain and leg swelling.  Gastrointestinal: Negative.  Negative for abdominal pain, blood in stool, constipation, diarrhea, melena and nausea.  Genitourinary: Negative.  Negative for hematuria.  Musculoskeletal: Negative.  Negative for back pain and joint pain.  Skin: Negative.  Negative for rash.  Neurological: Negative.  Negative for dizziness, focal weakness, weakness and headaches.  Psychiatric/Behavioral: Negative.  The patient is not nervous/anxious.     As per HPI. Otherwise, a complete review of systems is negative.  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, will need biopsy- 05/2023   Neuropathy    bilateral feet/legs   Peripheral arterial disease    Peripheral vascular disease    Mesenteric Artery Stent Placed 2023   Pneumonia    Positive fecal occult blood test 01/19/2022   Sleep apnea    wears cpap   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   R mid forearm, [facility]    PAST SURGICAL HISTORY: Past Surgical History:  Procedure Laterality Date   APPENDECTOMY  10/2016   BICEPT TENODESIS Left 05/18/2023   Procedure: BICEPS TENODESIS;  Surgeon: Addie Cordella Hamilton, MD;  Location: [facility] OR;  Service: Orthopedics;  Laterality: Left;   BREAST BIOPSY Right 2022   CELIAC ARTERY STENT     CHOLECYSTECTOMY  10/2016   COLONOSCOPY     12/2022   ENTEROSCOPY N/A 12/17/2022   Procedure: ENTEROSCOPY;  Surgeon: Federico Rosario BROCKS, MD;  Location: [facility];  Service: Gastroenterology;  Laterality: N/A;  small bowel enteroscopy   EYE SURGERY Bilateral 2017   cataracts removed   HOT HEMOSTASIS  12/17/2022   Procedure: HOT HEMOSTASIS (ARGON PLASMA COAGULATION/BICAP);  Surgeon: Federico Rosario BROCKS, MD;  Location: [facility];  Service: Gastroenterology;;   LUMBAR LAMINECTOMY/DECOMPRESSION MICRODISCECTOMY Left 07/21/2021   Procedure: Laminectomy and Foraminotomy - L4-L5 - left;  Surgeon: Onetha Kuba, MD;  Location: [facility] OR;  Service: Neurosurgery;  Laterality: Left;  3C   LUMBAR LAMINECTOMY/DECOMPRESSION MICRODISCECTOMY Right 08/09/2023   Procedure: Sublaminar decompression - Lumbar Two-Lumbar Three - right;  Surgeon: Onetha Kuba, MD;  Location: [facility] OR;  Service: Neurosurgery;  Laterality: Right;  3C   PAROTID GLAND TUMOR EXCISION Left 2003   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   Procedure:  PERIPHERAL VASCULAR INTERVENTION;  Surgeon: Sheree Penne Bruckner, MD;  Location: [facility] INVASIVE CV LAB;  Service: Cardiovascular;;  celiac   REVERSE SHOULDER ARTHROPLASTY Left 05/18/2023   Procedure: LEFT REVERSE SHOULDER ARTHROPLASTY;  Surgeon: Addie Cordella Hamilton, MD;  Location: [facility] OR;  Service: Orthopedics;  Laterality: Left;   TOTAL SHOULDER ARTHROPLASTY Right 08/06/2022   Procedure: RIGHT REVERSE SHOULDER ARTHROPLASTY;  Surgeon: Addie Cordella Hamilton, MD;  Location: [facility] OR;  Service: Orthopedics;  Laterality: Right;   VISCERAL ANGIOGRAPHY N/A 05/18/2022   Procedure: VISCERAL ANGIOGRAPHY;  Surgeon: Sheree Penne Bruckner, MD;  Location: [facility] INVASIVE CV LAB;  Service: Cardiovascular;  Laterality: N/A;    FAMILY HISTORY: Family History  Problem Relation Age of Onset   Heart disease Mother    Cerebral aneurysm Father     ADVANCED DIRECTIVES (Y/N):  N  HEALTH MAINTENANCE: Social History   Tobacco Use   Smoking status: Every Day    Current packs/day: 1.00    Average packs/day: 1 pack/day for 62.0 years (62.0 ttl pk-yrs)    Types: Cigarettes    Passive exposure: Current   Smokeless tobacco: Never   Tobacco comments:    Smokes 1- 1.5 PPD 01/29/2023  Vaping Use   Vaping status: Never Used  Substance Use Topics   Alcohol  use: Not Currently   Drug use: Never     Colonoscopy:  PAP:  Bone density:  Lipid panel:  Allergies  Allergen Reactions   Amoxicillin  Other (See Comments)    Yeast infections (in high doses)   Atorvastatin Other (See Comments)    myalgias   Rosuvastatin      Myaglias    Current Outpatient Medications  Medication Sig Dispense Refill   acetaminophen  (TYLENOL ) 500 MG tablet Take 1,000 mg by mouth every 6 (six) hours.     albuterol  (VENTOLIN  HFA) 108 (90 Base) MCG/ACT inhaler INHALE 1-2 PUFFS INTO THE LUNGS 4 (FOUR) TIMES DAILY AS NEEDED FOR SHORTNESS OF BREATH OR WHEEZING. 18 each 0   aspirin  EC 81 MG tablet Take 81 mg by mouth every other day. Swallow  whole.     Cholecalciferol  (VITAMIN D3) 125 MCG (5000 UT) CAPS Take 5,000 Units by mouth in the morning.     fluticasone -salmeterol (ADVAIR) 250-50 MCG/ACT AEPB Inhale 1 puff into the lungs every 12 (twelve) hours. 60 each 3   gabapentin  (NEURONTIN ) 300 MG capsule TAKE 1 CAPSULE BY MOUTH THREE TIMES A DAY 270 capsule 1   hydrocortisone  2.5 % cream Apply to rash on face and under breasts once to twice daily as directed. (Patient taking differently: Apply 1 Application topically daily as needed (for dry nose).) 30 g 2   Lutein  20 MG TABS Take 20 mg by mouth in the morning.     nystatin -triamcinolone  ointment (MYCOLOG) Apply 1 Application topically 2 (two) times daily. 30 g 1   omeprazole  (PRILOSEC) 40 MG capsule TAKE 1 CAPSULE (40 MG TOTAL) BY MOUTH DAILY. 90 capsule 1   Polyvinyl Alcohol -Povidone (REFRESH OP) Place 1 drop into both eyes as needed (dry eye).     pramipexole  (MIRAPEX ) 0.5 MG tablet TAKE 1 TABLET BY MOUTH TWICE  A DAY 180 tablet 1   venlafaxine  XR (EFFEXOR -XR) 75 MG 24 hr capsule TAKE 1 CAPSULE BY MOUTH EVERY DAY 90 capsule 3   lovastatin  (MEVACOR ) 20 MG tablet Take 1 tablet (20 mg total) by mouth at bedtime. (Patient not taking: Reported on 10/11/2024) 90 tablet 3   No current facility-administered medications for this visit.    OBJECTIVE: Vitals:   10/11/24 1345  BP: 129/67  Pulse: 82  Resp: 16  Temp: 98 F (36.7 C)  SpO2: 96%     Body mass index is 40.62 kg/m.    ECOG FS:0 - Asymptomatic  General: Well-developed, well-nourished, no acute distress. Eyes: Pink conjunctiva, anicteric sclera. HEENT: Normocephalic, moist mucous membranes. Lungs: No audible wheezing or coughing. Heart: Regular rate and rhythm. Abdomen: Soft, nontender, no obvious distention. Musculoskeletal: No edema, cyanosis, or clubbing. Neuro: Alert, answering all questions appropriately. Cranial nerves grossly intact. Skin: No rashes or petechiae noted. Psych: Normal affect.  LAB RESULTS:  Lab  Results  Component Value Date   NA 139 06/12/2024   K 4.2 06/12/2024   CL 103 06/12/2024   CO2 28 06/12/2024   GLUCOSE 94 06/12/2024   BUN 13 06/12/2024   CREATININE 0.66 06/12/2024   CALCIUM  9.5 06/12/2024   PROT 7.4 06/12/2024   ALBUMIN  4.2 06/12/2024   AST 14 06/12/2024   ALT 8 06/12/2024   ALKPHOS 84 06/12/2024   BILITOT 0.3 06/12/2024   GFRNONAA >60 08/06/2023    Lab Results  Component Value Date   WBC 5.3 10/10/2024   NEUTROABS 3.2 10/10/2024   HGB 12.3 10/10/2024   HCT 38.1 10/10/2024   MCV 91.1 10/10/2024   PLT 191 10/10/2024   Lab Results  Component Value Date   IRON  41 10/10/2024   TIBC 421 10/10/2024   IRONPCTSAT 10 (L) 10/10/2024   Lab Results  Component Value Date   FERRITIN 19 10/10/2024     STUDIES: MR ELBOW LEFT W WO CONTRAST Result Date: 10/09/2024 EXAM: MRI OF THE LEFT ELBOW WITH AND WITHOUT CONTRAST 10/02/2024 01:32:22 PM TECHNIQUE: Multiplanar multisequence MRI of the left elbow was performed with and without the administration of intravenous contrast. COMPARISON: None available. CLINICAL HISTORY: Lytic lesion-left elbow pain, throbbing, swelling and pain of elbow deep in the bone, radiates up and down. FINDINGS: MEDIAL EPICONDYLE: Partially torn common flexor tendon with associated enhancement as shown on image 11 series 106. LATERAL EPICONDYLE: Tendinopathy and partial tearing of the common extensor tendon. MEDIAL COLLATERAL LIGAMENT: At least partial tearing of the anterior bundle of the ulnar collateral ligament as on image 11 series 106. LATERAL COLLATERAL LIGAMENT: Suspected degenerative tearing of the proximal lateral ulnar collateral ligament for example on image 11 through 12 of series 12. Mildly attenuated proximal portion of the radial collateral ligament. MUSCLES AND TENDONS: Moderate distal triceps tendinopathy with surrounding subcutaneous edema. Moderate distal biceps tendinopathy. The brachialis tendon is intact. Muscle bulk in the elbow  is maintained. ULNAR NERVE: Normal MRI appearance of the ulnar nerve and cubital tunnel. JOINT SPACES: Mild spurring of the coronoid process. No significant joint effusion or significant degenerative disease. Normal alignment. BONE MARROW: In the radial lesion noted on conventional radiography, there is no underlying bony lesion and this lucency is thought to be incidental. No acute fracture or aggressive marrow replacing lesion. SOFT TISSUES: Surrounding subcutaneous edema associated with triceps tendinopathy. No significant soft tissue edema, fluid collections, or appreciable masses. IMPRESSION: 1. No underlying bony lesion corresponding to the presumed lytic lesion on radiography, which  appears incidental. 2. Partial tearing of the anterior bundle of the ulnar collateral ligament proximally with suspected degenerative tearing of the proximal lateral ulnar collateral ligament. 3. Partially torn common flexor tendon with associated enhancement. 4. Tendinopathy and partial tearing of the common extensor tendon. 5. Moderate distal triceps tendinopathy with surrounding subcutaneous edema. 6. Moderate distal biceps tendinopathy. 7. Mild coronoid process spurring. Electronically signed by: Ryan Salvage MD 10/09/2024 12:32 PM EST RP Workstation: [id number]    ASSESSMENT: Iron  deficiency anemia.  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  Her most recent luminal evaluation was in February 2024.  Hemoglobin continues to be within normal limits at 12.3, but her iron  saturation ration remains decreased at 10%.  Proceed with IV Venofer  today.  Patient will then return to clinic 2 times over the next week for additional treatments.  She will then return to clinic in 4 months with repeat laboratory work, further evaluation, and continuation of treatment if needed.   Constipation/nausea: Patient does not complain of this today.  Continue evaluation  and treatment per GI. Back pain: Patient does not complain of this today.  Continue follow-up with orthopedics as scheduled.  I spent a total of 30 minutes reviewing chart data, face-to-face evaluation with the patient, counseling and coordination of care as detailed above.    Patient expressed understanding and was in agreement with this plan. She also understands that She can call clinic at any time with any questions, concerns, or complaints.    Evalene JINNY Reusing, MD   10/12/2024 4:56 PM

## 2024-10-11 NOTE — Patient Instructions (Signed)

## 2024-10-12 ENCOUNTER — Encounter: Payer: Self-pay | Admitting: Oncology

## 2024-10-16 ENCOUNTER — Encounter: Payer: Self-pay | Admitting: Oncology

## 2024-10-16 ENCOUNTER — Telehealth: Payer: Self-pay | Admitting: Oncology

## 2024-10-16 NOTE — Telephone Encounter (Signed)
 Pt called to r/s ven for 12/11 - asked to r/s to next Thursday - r/s appt w/pt - pt requested appt reminder via mail - LH

## 2024-10-17 ENCOUNTER — Inpatient Hospital Stay

## 2024-10-19 ENCOUNTER — Inpatient Hospital Stay

## 2024-10-20 ENCOUNTER — Ambulatory Visit: Admitting: Surgical

## 2024-10-20 ENCOUNTER — Other Ambulatory Visit: Payer: Self-pay

## 2024-10-20 DIAGNOSIS — M79641 Pain in right hand: Secondary | ICD-10-CM

## 2024-10-20 DIAGNOSIS — M25522 Pain in left elbow: Secondary | ICD-10-CM

## 2024-10-20 DIAGNOSIS — H353212 Exudative age-related macular degeneration, right eye, with inactive choroidal neovascularization: Secondary | ICD-10-CM | POA: Diagnosis not present

## 2024-10-20 DIAGNOSIS — H353221 Exudative age-related macular degeneration, left eye, with active choroidal neovascularization: Secondary | ICD-10-CM | POA: Diagnosis not present

## 2024-10-20 MED ORDER — CEPHALEXIN 500 MG PO CAPS: 500.0000 mg | ORAL_CAPSULE | Freq: Four times a day (QID) | ORAL | 0 refills | Status: AC

## 2024-10-24 ENCOUNTER — Inpatient Hospital Stay

## 2024-10-24 VITALS — BP 113/63 | HR 77 | Temp 97.8°F | Resp 18

## 2024-10-24 DIAGNOSIS — D509 Iron deficiency anemia, unspecified: Secondary | ICD-10-CM | POA: Diagnosis not present

## 2024-10-24 MED ORDER — IRON SUCROSE 20 MG/ML IV SOLN
200.0000 mg | Freq: Once | INTRAVENOUS | Status: AC
  Administered 2024-10-24: 13:00:00 200 mg via INTRAVENOUS

## 2024-10-24 NOTE — Patient Instructions (Signed)

## 2024-10-25 ENCOUNTER — Encounter: Payer: Self-pay | Admitting: Surgical

## 2024-10-25 NOTE — Progress Notes (Cosign Needed)
 Office Visit Note   Patient: Alicia [name]           Date of Birth: 1946-03-05           MRN: [id number] Visit Date: 10/20/2024 Requested by: Corwin Antu, FNP [address] [place],  [place] [address] PCP: Corwin Antu, FNP  Subjective: Chief Complaint  Patient presents with   Left Elbow - Pain, Follow-up    MRI review   Right Hand - Pain    HPI: GLENNETTE [name] is a 78 y.o. female who presents to the office for MRI review. Patient denies any changes in symptoms.  Continues to complain mainly of worsening left elbow pain primarily in the posterior aspect of the elbow as well as the lateral epicondyle region.  Has been worsening over the last few months without any history of injury.  Very sensitive to the touch when she puts her elbow down or if she tries to turn over in bed using her elbow.  Taking Tylenol  Extra Strength with minimal relief.  She also complains of right hand swelling that came on late last week and has been ongoing for about 6 to 7 days.  She notes redness and swelling over the knuckles with heat but no drainage or streaking.  No insect bite or injury that she can recall.  She denies any fevers or chills.  No restriction in her range of motion of her fingers.  She is right-hand dominant.  MRI results revealed: MR ELBOW LEFT W WO CONTRAST Result Date: 10/09/2024 EXAM: MRI OF THE LEFT ELBOW WITH AND WITHOUT CONTRAST 10/02/2024 01:32:22 PM TECHNIQUE: Multiplanar multisequence MRI of the left elbow was performed with and without the administration of intravenous contrast. COMPARISON: None available. CLINICAL HISTORY: Lytic lesion-left elbow pain, throbbing, swelling and pain of elbow deep in the bone, radiates up and down. FINDINGS: MEDIAL EPICONDYLE: Partially torn common flexor tendon with associated enhancement as shown on image 11 series 106. LATERAL EPICONDYLE: Tendinopathy and partial tearing of the common extensor tendon. MEDIAL COLLATERAL LIGAMENT:  At least partial tearing of the anterior bundle of the ulnar collateral ligament as on image 11 series 106. LATERAL COLLATERAL LIGAMENT: Suspected degenerative tearing of the proximal lateral ulnar collateral ligament for example on image 11 through 12 of series 12. Mildly attenuated proximal portion of the radial collateral ligament. MUSCLES AND TENDONS: Moderate distal triceps tendinopathy with surrounding subcutaneous edema. Moderate distal biceps tendinopathy. The brachialis tendon is intact. Muscle bulk in the elbow is maintained. ULNAR NERVE: Normal MRI appearance of the ulnar nerve and cubital tunnel. JOINT SPACES: Mild spurring of the coronoid process. No significant joint effusion or significant degenerative disease. Normal alignment. BONE MARROW: In the radial lesion noted on conventional radiography, there is no underlying bony lesion and this lucency is thought to be incidental. No acute fracture or aggressive marrow replacing lesion. SOFT TISSUES: Surrounding subcutaneous edema associated with triceps tendinopathy. No significant soft tissue edema, fluid collections, or appreciable masses. IMPRESSION: 1. No underlying bony lesion corresponding to the presumed lytic lesion on radiography, which appears incidental. 2. Partial tearing of the anterior bundle of the ulnar collateral ligament proximally with suspected degenerative tearing of the proximal lateral ulnar collateral ligament. 3. Partially torn common flexor tendon with associated enhancement. 4. Tendinopathy and partial tearing of the common extensor tendon. 5. Moderate distal triceps tendinopathy with surrounding subcutaneous edema. 6. Moderate distal biceps tendinopathy. 7. Mild coronoid process spurring. Electronically signed by: Ryan Salvage MD 10/09/2024 12:32  PM EST RP Workstation: [id number]                 ROS: All systems reviewed are negative as they relate to the chief complaint within the history of present illness.  Patient  denies fevers or chills.  Assessment & Plan: Visit Diagnoses:  1. Pain in right hand   2. Pain in left elbow     Plan: Alicia [name] is a 78 y.o. female who presents to the office for review of left elbow MRI.  MRI demonstrates significant tendinopathy at multiple sites with tricep tendinopathy that seems to be the primary pain generator based on her exam today.  She has some early elbow arthritis but nothing that seems to be responsible for the majority of her pain.  We did discuss trying diagnostic elbow injection but she would like to try shockwave therapy with Dr. Burnetta first for her tricep tendinopathy.  We will set this up for her.  Regarding her right hand, she does have what appears to be cellulitis without any fluctuance or fluid collection or evidence of infectious tenosynovitis.  We will treat this with Keflex  for 10 days and see her back after completion of antibiotic course in order to see how she is doing.  Follow-up in about 2 weeks.  Follow-Up Instructions: No follow-ups on file.   Orders:  Orders Placed This Encounter  Procedures   XR Hand Complete Right   AMB referral to sports medicine   Meds ordered this encounter  Medications   cephALEXin  (KEFLEX ) 500 MG capsule    Sig: Take 1 capsule (500 mg total) by mouth 4 (four) times daily for 10 days.    Dispense:  40 capsule    Refill:  0      Procedures: No procedures performed   Clinical Data: No additional findings.  Objective: Vital Signs: There were no vitals taken for this visit.  Physical Exam:  Constitutional: Patient appears well-developed HEENT:  Head: Normocephalic Eyes:EOM are normal Neck: Normal range of motion Cardiovascular: Normal rate Pulmonary/chest: Effort normal Neurologic: Patient is alert Skin: Skin is warm Psychiatric: Patient has normal mood and affect  Ortho Exam: Ortho exam demonstrates left elbow with moderate tenderness over the olecranon without any significant fluid  collection or evidence of olecranon bursitis.  Does have some mild to moderate tenderness over the lateral epicondyle.  No tenderness over the medial epicondyle, bicep tendon.  Negative hook test.  She has tricep strength rated 5 -/5 which is slightly decreased compared with contralateral side.  Does reproduce her pain.  Has full elbow range of motion to about 0 degrees extension and easily greater than 110 degrees of elbow flexion.  No cellulitis or skin changes noted in the left elbow region.  Right hand demonstrates intact EPL, FPL, finger abduction.  There is redness without fluctuance noted over the dorsum of the hand near the MCP joints of the 3rd and 4th fingers.  Able to make full composite fist.  No pain with range of motion of these joints.  There is slight warmth noted in this region compared with contralateral hand.  2+ radial pulse of the right upper extremity.  Ultrasound exam demonstrates diffuse subcutaneous edema at this location without any abnormal effusion of any of the MCP joints or any fluid in the flexor tendon sheaths or extensor tendon sheaths of these fingers.  Specialty Comments:  EXAM: MRI LUMBAR SPINE WITHOUT CONTRAST   TECHNIQUE: Multiplanar, multisequence MR imaging of the lumbar spine  was performed. No intravenous contrast was administered.   COMPARISON:  MRI lumbar spine 02/23/2021, correlation is also made with lumbar spine radiographs 03/02/2023   FINDINGS: Segmentation: Partial sacralization of L5. The most inferior disc space is labeled L5-S1, consistent with the prior exams.   Alignment: Scoliosis, with overall levoscoliosis of the lumbar spine. Straightening of the normal lumbar lordosis. Trace retrolisthesis of L1 on L2. Trace anterolisthesis of L5 on S1. previously noted trace anterolisthesis of L3 on L4 is no longer appreciated.   Vertebrae: No acute or subacute fracture, evidence of discitis, or suspicious osseous lesion. Vertebral body heights  appear unchanged compared to 02/23/2021 endplate degenerative changes at L2-L3 and L3-L4, eccentric to the right and L4-L5, eccentric to the left, likely related to the scoliosis.   Conus medullaris and cauda equina: Conus extends to the L1 level. Conus and cauda equina appear normal.   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 No lymphadenopathy. Atrophy of the inferior paraspinous muscles.   Disc levels:   T11-T12: Seen only on the sagittal images. Minimal disc bulge. No spinal canal stenosis or neural foraminal narrowing.   T12-L1: Minimal disc bulge. Mild facet arthropathy. No spinal canal stenosis or neural foraminal narrowing.   L1-L2: Trace retrolisthesis and mild disc bulge. Mild facet arthropathy. No spinal canal stenosis. Mild right neural foraminal narrowing, which is new from the prior exam.   L2-L3: Disc height loss with likely lateral listhesis at this level, with right eccentric disc osteophyte complex. Left foraminal disc protrusion. Moderate right and mild left facet arthropathy. Narrowing of the right lateral recess. No spinal canal stenosis. Severe right neural foraminal narrowing, which has progressed from the prior exam.   L3-L4: Mild disc bulge, eccentric to the right. Moderate right and mild left facet arthropathy. Narrowing of the lateral recesses. No spinal canal stenosis. Moderate to severe right neural foraminal narrowing, which has progressed from prior exam.   L4-L5: Disc height loss, with left eccentric disc bulge. Moderate facet arthropathy. Narrowing of the left lateral recess. No spinal canal stenosis. Severe left and mild right neural foraminal narrowing, similar to the prior exam.   L5-S1: Grade 1 anterolisthesis with disc unroofing. Severe facet arthropathy. No spinal canal stenosis. Severe left and moderate right neural foraminal narrowing, similar to  prior.   IMPRESSION: 1. No acute or subacute fracture. 2. L2-L3 severe right neural foraminal narrowing. 3. L3-L4 moderate to severe right neural foraminal narrowing. 4. L4-L5 severe left and mild right neural foraminal narrowing. 5. L5-S1 severe left and moderate right neural foraminal narrowing. 6. Narrowing of the lateral recesses on the right at L2-L3, bilaterally at L3-L4, and on the left at L4-L5, which could affect the descending L3, L4, and L5 nerve roots, respectively.     Electronically Signed   By: Donald Campion M.D.   On: 03/25/2023 14:54  Imaging: No results found.   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   Secondary osteoarthritis of right shoulder due to rotator cuff tear 06/16/2022   Seborrheic dermatitis 06/16/2022   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   Centrilobular emphysema (HCC) 02/13/2022   Cigarette smoker 02/06/2022   Asthmatic bronchitis , chronic (HCC) 02/05/2022   Incidental pulmonary nodule, greater than or equal to 8mm 01/21/2022   Bilateral carotid artery stenosis 12/26/2021   Iron  deficiency anemia 12/26/2021   Mixed hyperlipidemia 12/26/2021   Chronic cough 12/26/2021   Adjustment reaction with anxiety and depression 12/26/2021   Chronic venous insufficiency 02/29/2020   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/05/2019   Arthritis of shoulder 06/08/2018   GERD (gastroesophageal reflux disease)  06/08/2018   Macular degeneration 06/08/2018   Sleep apnea 06/08/2018   Past Medical History:  Diagnosis Date   Actinic keratosis    Anemia    iron  deficiency   Aneurysmal dilatation    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, will need biopsy- 05/2023   Neuropathy    bilateral feet/legs   Peripheral arterial disease    Peripheral vascular disease    Mesenteric Artery Stent Placed 2023   Pneumonia    Positive fecal occult blood test 01/19/2022   Sleep apnea    wears cpap   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   R mid forearm, [facility]    Family History  Problem Relation Age of Onset   Heart disease Mother    Cerebral aneurysm Father     Past Surgical History:  Procedure Laterality Date   APPENDECTOMY  10/2016   BICEPT TENODESIS Left 05/18/2023   Procedure: BICEPS TENODESIS;  Surgeon: Addie Cordella Hamilton, MD;  Location: [facility] OR;  Service: Orthopedics;  Laterality: Left;   BREAST BIOPSY Right 2022   CELIAC ARTERY STENT     CHOLECYSTECTOMY  10/2016   COLONOSCOPY     12/2022   ENTEROSCOPY N/A 12/17/2022   Procedure: ENTEROSCOPY;  Surgeon: Federico Rosario BROCKS, MD;  Location: [facility];  Service: Gastroenterology;  Laterality: N/A;  small bowel enteroscopy   EYE SURGERY Bilateral 2017   cataracts removed   HOT HEMOSTASIS  12/17/2022   Procedure: HOT HEMOSTASIS (ARGON PLASMA COAGULATION/BICAP);  Surgeon: Federico Rosario BROCKS, MD;  Location: [facility];  Service: Gastroenterology;;   LUMBAR LAMINECTOMY/DECOMPRESSION MICRODISCECTOMY Left 07/21/2021   Procedure: Laminectomy and Foraminotomy - L4-L5 - left;  Surgeon: Onetha Kuba, MD;  Location: [facility] OR;  Service:  Neurosurgery;  Laterality: Left;  3C   LUMBAR LAMINECTOMY/DECOMPRESSION MICRODISCECTOMY Right 08/09/2023   Procedure: Sublaminar decompression - Lumbar Two-Lumbar Three - right;  Surgeon: Onetha Kuba, MD;  Location: [facility] OR;  Service: Neurosurgery;  Laterality: Right;  3C   PAROTID GLAND TUMOR EXCISION Left 2003   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   Procedure: PERIPHERAL VASCULAR INTERVENTION;  Surgeon: Sheree Penne Bruckner, MD;  Location: [facility] INVASIVE CV LAB;  Service: Cardiovascular;;  celiac   REVERSE SHOULDER ARTHROPLASTY Left 05/18/2023   Procedure: LEFT REVERSE SHOULDER ARTHROPLASTY;  Surgeon: Addie Cordella Hamilton, MD;  Location: [facility] OR;  Service: Orthopedics;  Laterality: Left;   TOTAL SHOULDER ARTHROPLASTY  Right 08/06/2022   Procedure: RIGHT REVERSE SHOULDER ARTHROPLASTY;  Surgeon: Addie Cordella Hamilton, MD;  Location: [facility] OR;  Service: Orthopedics;  Laterality: Right;   VISCERAL ANGIOGRAPHY N/A 05/18/2022   Procedure: VISCERAL ANGIOGRAPHY;  Surgeon: Sheree Penne Bruckner, MD;  Location: [facility] INVASIVE CV LAB;  Service: Cardiovascular;  Laterality: N/A;   Social History   Occupational History   Not on file  Tobacco Use   Smoking status: Every Day    Current packs/day: 1.00    Average packs/day: 1 pack/day for 62.0 years (62.0 ttl pk-yrs)    Types: Cigarettes    Passive exposure: Current   Smokeless tobacco: Never   Tobacco comments:    Smokes 1- 1.5 PPD 01/29/2023  Vaping Use   Vaping status: Never Used  Substance and Sexual Activity   Alcohol  use: Not Currently   Drug use: Never   Sexual activity: Not Currently

## 2024-10-26 ENCOUNTER — Inpatient Hospital Stay

## 2024-10-26 VITALS — BP 112/74 | HR 77 | Temp 97.5°F | Resp 18

## 2024-10-26 DIAGNOSIS — D509 Iron deficiency anemia, unspecified: Secondary | ICD-10-CM | POA: Diagnosis not present

## 2024-10-26 MED ORDER — IRON SUCROSE 20 MG/ML IV SOLN
200.0000 mg | Freq: Once | INTRAVENOUS | Status: AC
  Administered 2024-10-26: 14:00:00 200 mg via INTRAVENOUS

## 2024-10-26 NOTE — Patient Instructions (Signed)

## 2024-10-27 ENCOUNTER — Encounter: Payer: Self-pay | Admitting: Radiology

## 2024-11-04 ENCOUNTER — Other Ambulatory Visit: Payer: Self-pay | Admitting: Internal Medicine

## 2024-11-04 DIAGNOSIS — K2970 Gastritis, unspecified, without bleeding: Secondary | ICD-10-CM

## 2024-11-13 ENCOUNTER — Encounter: Admitting: Sports Medicine

## 2024-11-13 ENCOUNTER — Encounter: Payer: Self-pay | Admitting: Sports Medicine

## 2024-11-13 DIAGNOSIS — M19022 Primary osteoarthritis, left elbow: Secondary | ICD-10-CM

## 2024-11-13 DIAGNOSIS — G8929 Other chronic pain: Secondary | ICD-10-CM | POA: Diagnosis not present

## 2024-11-13 DIAGNOSIS — M778 Other enthesopathies, not elsewhere classified: Secondary | ICD-10-CM

## 2024-11-13 DIAGNOSIS — M67922 Unspecified disorder of synovium and tendon, left upper arm: Secondary | ICD-10-CM | POA: Diagnosis not present

## 2024-11-13 DIAGNOSIS — M25522 Pain in left elbow: Secondary | ICD-10-CM | POA: Diagnosis not present

## 2024-11-13 NOTE — Progress Notes (Signed)
 "  Alicia [name] - 79 y.o. female MRN [id number]  Date of birth: Jul 04, 1946  Office Visit Note: Visit Date: 11/13/2024 PCP: Corwin Antu, FNP Referred by: Corwin Antu, FNP  Subjective: Chief Complaint  Patient presents with   Left Elbow - Pain   HPI: Alicia [name] is a pleasant 79 y.o. female who presents today for chronic left elbow pain.  Left elbow - Alicia [name] has had left elbow pain for about 4 months. She says that she uses her cane in her left hand, and she often catches/pushes against doors with the left elbow. She otherwise cannot think of any known injury to the elbow. She has not taken any medicine for her pain or done anything to treat it besides topical treatments at home.  She has seen [facility] for this, recommended trialing shockwave versus intra-articular injection.  She would like to trial shockwave first.  She is using gabapentin  300 mg 3 times daily for this and her carpal tunnel.  Pertinent ROS were reviewed with the patient and found to be negative unless otherwise specified above in HPI.   Assessment & Plan: Visit Diagnoses:  1. Chronic elbow pain, left   2. Arthritis of left elbow   3. Tendinitis of left triceps   4. Tendinopathy of elbow, left    Plan: Impression is acute on chronic left elbow pain which is multifactorial in nature as she does have arthritic change as well as insertional triceps tendinopathy with spurring.  She also has partial tearing of the common flexor tendon origin with a degree of medial epicondylitis.  Given this, we did perform our first treatment trial of extracorporeal shockwave therapy for the CFT and the triceps/olecranon, patient tolerated well.  I would like to see her back for an additional 1 or 2 treatments and see what sort of cumulative benefit she has going forward.  She may continue her gabapentin  and tylenol , okay for topical treatments as well as such as Voltaren gel.   Did discuss consideration of  intra-articular elbow injection as well if not improving with above.  Follow-up: Return for Schedule 2 appts 1-week apart (elbow).   Meds & Orders: No orders of the defined types were placed in this encounter.  No orders of the defined types were placed in this encounter.    Procedures: Procedure: ECSWT Indications:  Left elbow pain   Procedure Details Consent: Risks of procedure as well as the alternatives and risks of each were explained to the patient.  Verbal consent for procedure obtained. Time Out: Verified patient identification, verified procedure, site was marked, verified correct patient position. The area was cleaned with alcohol  swab.     The left elbow (triceps origin) was targeted for Extracorporeal shockwave therapy.    Preset: Epicondylitis / Tendinopathy Power Level: 80 mJ Frequency: 90 Hz Impulse/cycles: 1400 Head size: Regular  *common flexor tendon origin   Preset: Epicondylitis / Tendinopathy Power Level: 90 mJ Frequency: 100 Hz Impulse/cycles: 1400 Head size: Regular  Patient tolerated procedure well without immediate complications.         Clinical History:   She reports that she has been smoking cigarettes. She has a 62 pack-year smoking history. She has been exposed to tobacco smoke. She has never used smokeless tobacco. No results for input(s): HGBA1C, LABURIC in the last 8760 hours.  Objective:    Physical Exam  Gen: Well-appearing, in no acute distress; non-toxic CV: Well-perfused. Warm.  Resp: Breathing unlabored on room air; no wheezing. Psych: Fluid  speech in conversation; appropriate affect; normal thought process  Ortho Exam - Left elbow: There is pain and restriction with endrange flexion.  Positive TTP over the insertion of the tricep tendon as well as the origin of the common flexor and tendon.  No joint effusion noted.  Imaging:  *Independent review and interpretation of left elbow MRI from 10/02/2024 was performed by  myself today.  MRI demonstrates moderate degenerative change and arthritic change within the elbow joint.  There is mucoid degeneration of the UCL.  There is partial tearing at the origin of the common flexor tendon origin with T2 enhancement.  There is at least moderate distal tricep tendinopathy with associated spurring.  Narrative & Impression  EXAM: MRI OF THE LEFT ELBOW WITH AND WITHOUT CONTRAST 10/02/2024 01:32:22 PM   TECHNIQUE: Multiplanar multisequence MRI of the left elbow was performed with and without the administration of intravenous contrast.   COMPARISON: None available.   CLINICAL HISTORY: Lytic lesion-left elbow pain, throbbing, swelling and pain of elbow deep in the bone, radiates up and down.   FINDINGS:   MEDIAL EPICONDYLE: Partially torn common flexor tendon with associated enhancement as shown on image 11 series 106.   LATERAL EPICONDYLE: Tendinopathy and partial tearing of the common extensor tendon.   MEDIAL COLLATERAL LIGAMENT: At least partial tearing of the anterior bundle of the ulnar collateral ligament as on image 11 series 106.   LATERAL COLLATERAL LIGAMENT: Suspected degenerative tearing of the proximal lateral ulnar collateral ligament for example on image 11 through 12 of series 12. Mildly attenuated proximal portion of the radial collateral ligament.   MUSCLES AND TENDONS: Moderate distal triceps tendinopathy with surrounding subcutaneous edema. Moderate distal biceps tendinopathy. The brachialis tendon is intact. Muscle bulk in the elbow is maintained.   ULNAR NERVE: Normal MRI appearance of the ulnar nerve and cubital tunnel.   JOINT SPACES: Mild spurring of the coronoid process. No significant joint effusion or significant degenerative disease. Normal alignment.   BONE MARROW: In the radial lesion noted on conventional radiography, there is no underlying bony lesion and this lucency is thought to be incidental. No acute fracture  or aggressive marrow replacing lesion.   SOFT TISSUES: Surrounding subcutaneous edema associated with triceps tendinopathy. No significant soft tissue edema, fluid collections, or appreciable masses.   IMPRESSION: 1. No underlying bony lesion corresponding to the presumed lytic lesion on radiography, which appears incidental. 2. Partial tearing of the anterior bundle of the ulnar collateral ligament proximally with suspected degenerative tearing of the proximal lateral ulnar collateral ligament. 3. Partially torn common flexor tendon with associated enhancement. 4. Tendinopathy and partial tearing of the common extensor tendon. 5. Moderate distal triceps tendinopathy with surrounding subcutaneous edema. 6. Moderate distal biceps tendinopathy. 7. Mild coronoid process spurring.   Electronically signed by: Ryan Salvage MD 10/09/2024 12:32 PM EST RP Workstation: [id number]   07/28/24: AP and lateral views of left elbow reviewed.  Mild to moderate  degenerative changes noted of the left elbow joint with mild joint space  narrowing and osteophyte formation.  Lytic bone lesion noted in the  proximal aspect of the radius with no periosteal reaction    Past Medical/Family/Surgical/Social History: Medications & Allergies reviewed per EMR, new medications updated. 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 05/18/2023   Class 2 severe obesity due to excess calories with serious comorbidity and body mass index (BMI) of 37.0 to 37.9 in adult 12/28/2022   On statin therapy 12/28/2022   S/P reverse total shoulder arthroplasty, right 08/06/2022   Secondary osteoarthritis of right shoulder due to rotator cuff tear  06/16/2022   Seborrheic dermatitis 06/16/2022   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   Centrilobular emphysema (HCC) 02/13/2022   Cigarette smoker 02/06/2022   Asthmatic bronchitis , chronic (HCC) 02/05/2022   Incidental pulmonary nodule, greater than or equal to 8mm 01/21/2022   Bilateral carotid artery stenosis 12/26/2021   Iron  deficiency anemia 12/26/2021   Mixed hyperlipidemia 12/26/2021   Chronic cough 12/26/2021   Adjustment reaction with anxiety and depression 12/26/2021   Chronic venous insufficiency 02/29/2020   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/05/2019   Arthritis of shoulder 06/08/2018   GERD (gastroesophageal reflux disease) 06/08/2018   Macular degeneration 06/08/2018   Sleep apnea 06/08/2018   Past Medical History:  Diagnosis Date   Actinic keratosis    Anemia    iron  deficiency   Aneurysmal dilatation    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, will need biopsy- 05/2023   Neuropathy    bilateral feet/legs   Peripheral arterial disease    Peripheral vascular disease    Mesenteric Artery Stent Placed 2023   Pneumonia    Positive fecal occult blood test 01/19/2022   Sleep apnea    wears cpap   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   R mid forearm, [facility]   Family History  Problem Relation Age of Onset   Heart disease Mother    Cerebral aneurysm Father    Past Surgical History:  Procedure Laterality Date   APPENDECTOMY  10/2016   BICEPT TENODESIS Left 05/18/2023   Procedure: BICEPS TENODESIS;  Surgeon: Addie Cordella Hamilton, MD;  Location: [facility] OR;  Service: Orthopedics;  Laterality: Left;   BREAST BIOPSY Right 2022    CELIAC ARTERY STENT     CHOLECYSTECTOMY  10/2016   COLONOSCOPY     12/2022   ENTEROSCOPY N/A 12/17/2022   Procedure: ENTEROSCOPY;  Surgeon: Federico Rosario BROCKS, MD;  Location: [facility];  Service: Gastroenterology;  Laterality: N/A;  small bowel enteroscopy   EYE SURGERY Bilateral 2017   cataracts removed   HOT HEMOSTASIS  12/17/2022   Procedure: HOT HEMOSTASIS (ARGON PLASMA COAGULATION/BICAP);  Surgeon: Federico Rosario BROCKS, MD;  Location: [facility];  Service: Gastroenterology;;   LUMBAR LAMINECTOMY/DECOMPRESSION MICRODISCECTOMY Left 07/21/2021   Procedure: Laminectomy and Foraminotomy - L4-L5 - left;  Surgeon: Onetha Kuba, MD;  Location: [facility] OR;  Service: Neurosurgery;  Laterality: Left;  3C   LUMBAR LAMINECTOMY/DECOMPRESSION MICRODISCECTOMY Right 08/09/2023   Procedure: Sublaminar decompression - Lumbar Two-Lumbar Three - right;  Surgeon: Onetha Kuba, MD;  Location: [facility] OR;  Service: Neurosurgery;  Laterality: Right;  3C   PAROTID GLAND TUMOR EXCISION Left 2003   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   Procedure: PERIPHERAL VASCULAR INTERVENTION;  Surgeon: Sheree Penne Bruckner, MD;  Location:  [facility] INVASIVE CV LAB;  Service: Cardiovascular;;  celiac   REVERSE SHOULDER ARTHROPLASTY Left 05/18/2023   Procedure: LEFT REVERSE SHOULDER ARTHROPLASTY;  Surgeon: Addie Cordella Hamilton, MD;  Location: [facility] OR;  Service: Orthopedics;  Laterality: Left;   TOTAL SHOULDER ARTHROPLASTY Right 08/06/2022   Procedure: RIGHT REVERSE SHOULDER ARTHROPLASTY;  Surgeon: Addie Cordella Hamilton, MD;  Location: [facility] OR;  Service: Orthopedics;  Laterality: Right;   VISCERAL ANGIOGRAPHY N/A 05/18/2022   Procedure: VISCERAL ANGIOGRAPHY;  Surgeon: Sheree Penne Bruckner, MD;  Location: [facility] INVASIVE CV LAB;  Service: Cardiovascular;  Laterality: N/A;   Social History   Occupational History   Not on file  Tobacco Use   Smoking status: Every Day    Current packs/day: 1.00    Average packs/day: 1 pack/day for 62.0 years  (62.0 ttl pk-yrs)    Types: Cigarettes    Passive exposure: Current   Smokeless tobacco: Never   Tobacco comments:    Smokes 1- 1.5 PPD 01/29/2023  Vaping Use   Vaping status: Never Used  Substance and Sexual Activity   Alcohol  use: Not Currently   Drug use: Never   Sexual activity: Not Currently   "

## 2024-11-13 NOTE — Progress Notes (Signed)
 Patient says that she has had left elbow pain for about 4 months. She says that she uses her cane in her left hand, and she often catches/pushes against doors with the left elbow. She otherwise cannot think of any known injury to the elbow. She has not taken any medicine for her pain or done anything to treat it besides topical treatments at home. She is here for evaluation and trial of shockwave therapy.

## 2024-11-15 ENCOUNTER — Ambulatory Visit: Payer: Self-pay | Admitting: Family

## 2024-11-15 NOTE — Telephone Encounter (Signed)
 FYI Only or Action Required?: Action required by provider: clinical question for provider and update on patient condition.  Patient was last seen in primary care on 06/12/2024 by Corwin Antu, FNP.  Called Nurse Triage reporting Cough.  Symptoms began yesterday.  Interventions attempted: OTC medications: Tylenol .  Symptoms are: gradually worsening.  Triage Disposition: See HCP Within 4 Hours (Or PCP Triage)  Patient/caregiver understands and will follow disposition?: Yes            Copied from CRM #[id number]. Topic: Clinical - Red Word Triage >> Nov 15, 2024  8:23 AM Eva FALCON wrote: Red Word that prompted transfer to Nurse Triage: running fever, severe cough, headache, body aches, sore throat, slight swelling in throat. is requesting flu medication. States two other people in the house already tested positive for Flu. Reason for Disposition  Wheezing is present  Answer Assessment - Initial Assessment Questions 1. ONSET: When did the cough begin?      Yesterday   2. SEVERITY: How bad is the cough today?      Intermittent   3. SPUTUM: Describe the color of your sputum (e.g., none, dry cough; clear, white, yellow, green)     White   4. HEMOPTYSIS: Are you coughing up any blood? If Yes, ask: How much? (e.g., flecks, streaks, tablespoons, etc.)     No   5. DIFFICULTY BREATHING: Are you having difficulty breathing? If Yes, ask: How bad is it? (e.g., mild, moderate, severe)      No   6. FEVER: Do you have a fever? If Yes, ask: What is your temperature, how was it measured, and when did it start?     101.0 this morning   7. CARDIAC HISTORY: Do you have any history of heart disease? (e.g., heart attack, congestive heart failure)      Artery stenosis   8. LUNG HISTORY: Do you have any history of lung disease?  (e.g., pulmonary embolus, asthma, emphysema)     Emphysema   10. OTHER SYMPTOMS: Do you have any other symptoms? (e.g., runny nose, wheezing,  chest pain)       Wheezing noted     Patient called in to triage with complaints of  fever, severe cough, headache, body aches, sore throat, slight swelling in throat This has been ongoing for one day The patient stated there are 3 others in her household diagnosed with flu A For home care, the patient is taking Tylenol .   Appointment declined for further evaluation; she states she is to ill to drive and is requesting PCP call in albuterol  and other medication for the symptoms. Please advise.  Protocols used: Cough - Acute Productive-A-AH

## 2024-11-15 NOTE — Telephone Encounter (Signed)
 NOTED

## 2024-11-16 ENCOUNTER — Encounter: Payer: Self-pay | Admitting: Emergency Medicine

## 2024-11-16 ENCOUNTER — Ambulatory Visit (INDEPENDENT_AMBULATORY_CARE_PROVIDER_SITE_OTHER)

## 2024-11-16 ENCOUNTER — Ambulatory Visit: Payer: Self-pay | Admitting: Family

## 2024-11-16 ENCOUNTER — Telehealth: Payer: Self-pay | Admitting: Emergency Medicine

## 2024-11-16 ENCOUNTER — Other Ambulatory Visit

## 2024-11-16 ENCOUNTER — Ambulatory Visit
Admission: EM | Admit: 2024-11-16 | Discharge: 2024-11-16 | Disposition: A | Discharge status: Home / Self Care | Attending: Emergency Medicine | Admitting: Emergency Medicine

## 2024-11-16 DIAGNOSIS — Z20828 Contact with and (suspected) exposure to other viral communicable diseases: Secondary | ICD-10-CM | POA: Diagnosis not present

## 2024-11-16 DIAGNOSIS — J441 Chronic obstructive pulmonary disease with (acute) exacerbation: Secondary | ICD-10-CM | POA: Diagnosis not present

## 2024-11-16 LAB — POCT INFLUENZA A/B
Influenza A, POC: NEGATIVE
Influenza B, POC: NEGATIVE

## 2024-11-16 MED ORDER — DEXAMETHASONE SOD PHOSPHATE PF 10 MG/ML IJ SOLN
10.0000 mg | Freq: Once | INTRAMUSCULAR | Status: AC
  Administered 2024-11-16: 10 mg via INTRAMUSCULAR

## 2024-11-16 MED ORDER — FLUTICASONE-SALMETEROL 250-50 MCG/ACT IN AEPB: 1.0000 | INHALATION_SPRAY | Freq: Two times a day (BID) | RESPIRATORY_TRACT | 2 refills | Status: AC

## 2024-11-16 MED ORDER — PREDNISONE 10 MG (21) PO TBPK: ORAL_TABLET | Freq: Every day | ORAL | 0 refills | Status: DC

## 2024-11-16 MED ORDER — ALBUTEROL SULFATE HFA 108 (90 BASE) MCG/ACT IN AERS: 2.0000 | INHALATION_SPRAY | RESPIRATORY_TRACT | 2 refills | Status: AC | PRN

## 2024-11-16 MED ORDER — IPRATROPIUM-ALBUTEROL 0.5-2.5 (3) MG/3ML IN SOLN
3.0000 mL | Freq: Once | RESPIRATORY_TRACT | Status: AC
  Administered 2024-11-16: 3 mL via RESPIRATORY_TRACT

## 2024-11-16 MED ORDER — FLUTICASONE-SALMETEROL 250-50 MCG/ACT IN AEPB: 1.0000 | INHALATION_SPRAY | Freq: Two times a day (BID) | RESPIRATORY_TRACT | 3 refills | Status: DC

## 2024-11-16 MED ORDER — AZITHROMYCIN 250 MG PO TABS: 250.0000 mg | ORAL_TABLET | Freq: Every day | ORAL | 0 refills | Status: AC

## 2024-11-16 MED ORDER — ALBUTEROL SULFATE HFA 108 (90 BASE) MCG/ACT IN AERS: 1.0000 | INHALATION_SPRAY | Freq: Four times a day (QID) | RESPIRATORY_TRACT | 2 refills | Status: DC | PRN

## 2024-11-16 MED ORDER — PREDNISONE 10 MG (21) PO TBPK: ORAL_TABLET | Freq: Every day | ORAL | 0 refills | Status: AC

## 2024-11-16 NOTE — Telephone Encounter (Signed)
Sending prescription

## 2024-11-16 NOTE — Discharge Instructions (Signed)
 Today you are being treated for a flareup of your emphysema  Chest x-ray is pending and you will be notified of results by telephone  Begin azithromycin  for coverage of any contributing bacteria  You were given a breathing treatment and the steroid injection in progressively throughout the day breathing should improve  Rescue inhaler and daily inhaler have been refilled, begin use once returning home  Starting tomorrow take oral prednisone  to keep the airway open and relaxed    You can take Tylenol  and/or Ibuprofen as needed for fever reduction and pain relief.   For cough: honey 1/2 to 1 teaspoon (you can dilute the honey in water  or another fluid).  You can also use guaifenesin and dextromethorphan for cough. You can use a humidifier for chest congestion and cough.  If you don't have a humidifier, you can sit in the bathroom with the hot shower running.      For sore throat: try warm salt water  gargles, cepacol lozenges, throat spray, warm tea or water  with lemon/honey, popsicles or ice, or OTC cold relief medicine for throat discomfort.   For congestion: take a daily anti-histamine like Zyrtec, Claritin, and a oral decongestant, such as pseudoephedrine.  You can also use Flonase 1-2 sprays in each nostril daily.   It is important to stay hydrated: drink plenty of fluids (water , gatorade/powerade/pedialyte, juices, or teas) to keep your throat moisturized and help further relieve irritation/discomfort.

## 2024-11-16 NOTE — ED Triage Notes (Signed)
 Patient reports SOB, cough, congestion, wheezing and sore throat x 3-4 days. Patient is out of her inhalers daily and rescue. Patient has been exposed to Flu -A.

## 2024-11-16 NOTE — Telephone Encounter (Signed)
 I spoke with pt; pt said for several days pt has had chest congestion and prod cough with white phlegm for at least 2 days. Pt has SOB when talking ad fever this AM was 100. Pt has hx of emphysema and pt is wheezing. Pt has body aches, H/A and S/T and pt vomited x 1 this AM. Pt has been exposed to the flu. Pt has not had flu test, covid test or checked for strep throat. Pt notified as instructed by T Corwin FNP and someone who is with pt now will take her to [facility] now. Pt appreciates call and Tabitha's concern. Sending note Marin Corwin FNP

## 2024-11-16 NOTE — ED Notes (Signed)
 Patient triage by provider A. White, NP

## 2024-11-16 NOTE — Telephone Encounter (Signed)
 Given her symptoms and multiple other co morbidities and being too ill to drive please re triage.   I would need to assess especially is wheezing is present as possible pneumonia. Flu exposure could also cause this to develop. Would need to confirm flu. Could a family member bring her to an urgent care or if we have in office appts?

## 2024-11-16 NOTE — Telephone Encounter (Signed)
 FYI Only or Action Required?: Action required by provider: Requesting medications, see notes.  Patient was last seen in primary care on 06/12/2024 by Corwin Antu, FNP.  Called Nurse Triage reporting Cough.  Symptoms began several days ago.  Interventions attempted: OTC medications: Tylenol  extra strength.  Symptoms are: unchanged.  Triage Disposition: See HCP Within 4 Hours (Or PCP Triage)  Patient/caregiver understands and will follow disposition?:  Reason for Disposition  Wheezing is present  Answer Assessment - Initial Assessment Questions Patient reports being out of inhalers and needing a refill. Taking Tylenol  extra strength. Patient states she is too ill to drive and wants to know if medication can be called in to [facility] on file. 2 family members have the flu. Please advise. Requesting call back [phone]  1. ONSET: When did the cough begin?      2 days ago  2. SEVERITY: How bad is the cough today?      Moderate  3. SPUTUM: Describe the color of your sputum (e.g., none, dry cough; clear, white, yellow, green)     White  4. HEMOPTYSIS: Are you coughing up any blood? If Yes, ask: How much? (e.g., flecks, streaks, tablespoons, etc.)     Denies  5. DIFFICULTY BREATHING: Are you having difficulty breathing? If Yes, ask: How bad is it? (e.g., mild, moderate, severe)      Mild SOB baseline, slightly worse  6. FEVER: Do you have a fever? If Yes, ask: What is your temperature, how was it measured, and when did it start?     100 this morning  7. CARDIAC HISTORY: Do you have any history of heart disease? (e.g., heart attack, congestive heart failure)       Artery stenosis  8. LUNG HISTORY: Do you have any history of lung disease?  (e.g., pulmonary embolus, asthma, emphysema)     Emphysema   9. OTHER SYMPTOMS: Do you have any other symptoms? (e.g., runny nose, wheezing, chest pain)       Nasal congestion, mouth breathing. Headaches, body  aches, wheezing, vomited this morning.  Protocols used: Cough - Acute Productive-A-AH  Copied from CRM #[id number]. Topic: Clinical - Red Word Triage >> Nov 16, 2024  8:03 AM Laymon HERO wrote: Red Word that prompted transfer to Nurse Triage: Patient called yesterday- still having severe sore throat, fever, cough, headache, body aches.

## 2024-11-16 NOTE — ED Provider Notes (Signed)
 " CAY RALPH PELT    CSN: [id number] Arrival date & time: 11/16/24  1245      History   Chief Complaint Chief Complaint  Patient presents with   Shortness of Breath    HPI MAKEYLA GOVAN is a 79 y.o. female.   Patient presents for evaluation of fever, chills, nasal congestion, chest congestion, productive cough, shortness of breath at rest exacerbated by exertion and wheezing present for 3 to 4 days.  Known exposure to influenza A.  History of emphysema and asthma, ran out of daily and rescue inhaler.  Has attempted use of Tylenol  Extra Strength.    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, will need biopsy- 05/2023   Neuropathy    bilateral feet/legs   Peripheral arterial disease    Peripheral vascular disease    Mesenteric Artery Stent Placed 2023   Pneumonia    Positive fecal occult blood test 01/19/2022   Sleep apnea    wears cpap   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   R mid forearm, [facility]    Patient Active Problem List   Diagnosis Date Noted   Tobacco abuse 06/12/2024   Post-menopausal 06/12/2024   Polyarthralgia 06/12/2024   Intertrigo 03/16/2024   Spinal stenosis of lumbar region 08/09/2023   Biceps tendonitis on left 06/05/2023   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 05/18/2023   Class 2 severe obesity due to excess calories with serious comorbidity and body mass index (BMI) of 37.0 to 37.9 in adult 12/28/2022   On statin therapy 12/28/2022   S/P reverse total shoulder  arthroplasty, right 08/06/2022   Secondary osteoarthritis of right shoulder due to rotator cuff tear 06/16/2022   Seborrheic dermatitis 06/16/2022   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   Centrilobular emphysema (HCC) 02/13/2022   Cigarette smoker 02/06/2022   Asthmatic bronchitis , chronic (HCC) 02/05/2022   Incidental pulmonary nodule, greater than or equal to 8mm 01/21/2022   Bilateral carotid artery stenosis 12/26/2021   Iron  deficiency anemia 12/26/2021   Mixed hyperlipidemia 12/26/2021   Chronic cough 12/26/2021   Adjustment reaction with anxiety and depression 12/26/2021   Chronic venous insufficiency 02/29/2020   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/05/2019   Arthritis of shoulder 06/08/2018   GERD (gastroesophageal reflux disease) 06/08/2018   Macular degeneration 06/08/2018   Sleep apnea 06/08/2018    Past Surgical History:  Procedure Laterality Date   APPENDECTOMY  10/2016   BICEPT TENODESIS Left 05/18/2023   Procedure: BICEPS TENODESIS;  Surgeon: Addie Cordella Hamilton, MD;  Location: [facility] OR;  Service: Orthopedics;  Laterality: Left;   BREAST BIOPSY Right 2022   CELIAC ARTERY STENT     CHOLECYSTECTOMY  10/2016   COLONOSCOPY     12/2022   ENTEROSCOPY N/A 12/17/2022   Procedure: ENTEROSCOPY;  Surgeon: Federico Rosario BROCKS, MD;  Location: [facility];  Service: Gastroenterology;  Laterality: N/A;  small bowel enteroscopy  EYE SURGERY Bilateral 2017   cataracts removed   HOT HEMOSTASIS  12/17/2022   Procedure: HOT HEMOSTASIS (ARGON PLASMA COAGULATION/BICAP);  Surgeon: Federico Rosario BROCKS, MD;  Location: [facility];  Service: Gastroenterology;;   LUMBAR LAMINECTOMY/DECOMPRESSION MICRODISCECTOMY Left 07/21/2021   Procedure: Laminectomy and Foraminotomy - L4-L5 - left;  Surgeon: Onetha Kuba, MD;  Location: [facility] OR;  Service: Neurosurgery;  Laterality: Left;  3C   LUMBAR LAMINECTOMY/DECOMPRESSION MICRODISCECTOMY Right 08/09/2023   Procedure: Sublaminar decompression -  Lumbar Two-Lumbar Three - right;  Surgeon: Onetha Kuba, MD;  Location: [facility] OR;  Service: Neurosurgery;  Laterality: Right;  3C   PAROTID GLAND TUMOR EXCISION Left 2003   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   Procedure: PERIPHERAL VASCULAR INTERVENTION;  Surgeon: Sheree Penne Bruckner, MD;  Location: [facility] INVASIVE CV LAB;  Service: Cardiovascular;;  celiac   REVERSE SHOULDER ARTHROPLASTY Left 05/18/2023   Procedure: LEFT REVERSE SHOULDER ARTHROPLASTY;  Surgeon: Addie Cordella Hamilton, MD;  Location: [facility] OR;  Service: Orthopedics;  Laterality: Left;   TOTAL SHOULDER ARTHROPLASTY Right 08/06/2022   Procedure: RIGHT REVERSE SHOULDER ARTHROPLASTY;  Surgeon: Addie Cordella Hamilton, MD;  Location: [facility] OR;  Service: Orthopedics;  Laterality: Right;   VISCERAL ANGIOGRAPHY N/A 05/18/2022   Procedure: VISCERAL ANGIOGRAPHY;  Surgeon: Sheree Penne Bruckner, MD;  Location: [facility] INVASIVE CV LAB;  Service: Cardiovascular;  Laterality: N/A;    OB History   No obstetric history on file.      Home Medications    Prior to Admission medications  Medication Sig Start Date End Date Taking? Authorizing Provider  acetaminophen  (TYLENOL ) 500 MG tablet Take 1,000 mg by mouth every 6 (six) hours.    [provider]  albuterol  (VENTOLIN  HFA) 108 (90 Base) MCG/ACT inhaler INHALE 1-2 PUFFS INTO THE LUNGS 4 (FOUR) TIMES DAILY AS NEEDED FOR SHORTNESS OF BREATH OR WHEEZING. 07/05/23   Corwin Antu, FNP  aspirin  EC 81 MG tablet Take 81 mg by mouth every other day. Swallow whole.    [provider]  Cholecalciferol  (VITAMIN D3) 125 MCG (5000 UT) CAPS Take 5,000 Units by mouth in the morning.    [provider]  fluticasone -salmeterol (ADVAIR) 250-50 MCG/ACT AEPB Inhale 1 puff into the lungs every 12 (twelve) hours. 11/09/23   Hope Almarie ORN, NP  gabapentin  (NEURONTIN ) 300 MG capsule TAKE 1 CAPSULE BY MOUTH THREE TIMES A DAY 08/08/24   [name], Charles L, PA-C  hydrocortisone  2.5 % cream  Apply to rash on face and under breasts once to twice daily as directed. Patient taking differently: Apply 1 Application topically daily as needed (for dry nose). 01/19/23   Jackquline Sawyer, MD  lovastatin  (MEVACOR ) 20 MG tablet Take 1 tablet (20 mg total) by mouth at bedtime. Patient not taking: Reported on 10/11/2024 06/23/24   Corwin Antu, FNP  Lutein  20 MG TABS Take 20 mg by mouth in the morning.    [provider]  nystatin -triamcinolone  ointment (MYCOLOG) Apply 1 Application topically 2 (two) times daily. 06/12/24   Corwin Antu, FNP  omeprazole  (PRILOSEC) 40 MG capsule TAKE 1 CAPSULE (40 MG TOTAL) BY MOUTH DAILY. 11/06/24   May, Deanna J, NP  Polyvinyl Alcohol -Povidone (REFRESH OP) Place 1 drop into both eyes as needed (dry eye).    [provider]  pramipexole  (MIRAPEX ) 0.5 MG tablet TAKE 1 TABLET BY MOUTH TWICE A DAY 05/02/24   Corwin Antu, FNP  venlafaxine  XR (EFFEXOR -XR) 75 MG 24 hr capsule TAKE 1 CAPSULE BY MOUTH EVERY DAY 08/07/24   Corwin Antu,  FNP    Family History Family History  Problem Relation Age of Onset   Heart disease Mother    Cerebral aneurysm Father     Social History Social History[1]   Allergies   Amoxicillin , Atorvastatin, and Rosuvastatin    Review of Systems Review of Systems   Physical Exam Triage Vital Signs ED Triage Vitals  Encounter Vitals Group     BP 11/16/24 1252 127/77     Girls Systolic BP Percentile --      Girls Diastolic BP Percentile --      Boys Systolic BP Percentile --      Boys Diastolic BP Percentile --      Pulse Rate 11/16/24 1252 92     Resp 11/16/24 1252 (!) 24     Temp 11/16/24 1252 98.3 F (36.8 C)     Temp Source 11/16/24 1252 Oral     SpO2 11/16/24 1252 93 %     Weight --      Height --      Head Circumference --      Peak Flow --      Pain Score 11/16/24 1254 0     Pain Loc --      Pain Education --      Exclude from Growth Chart --    No data found.  Updated Vital Signs BP 127/77  (BP Location: Right Arm)   Pulse 92   Temp 98.3 F (36.8 C) (Oral)   Resp (!) 24   SpO2 93%   Visual Acuity Right Eye Distance:   Left Eye Distance:   Bilateral Distance:    Right Eye Near:   Left Eye Near:    Bilateral Near:     Physical Exam Constitutional:      Appearance: Normal appearance.  HENT:     Right Ear: Tympanic membrane, ear canal and external ear normal.     Left Ear: Tympanic membrane, ear canal and external ear normal.     Nose: Congestion present.     Mouth/Throat:     Pharynx: Posterior oropharyngeal erythema present. No oropharyngeal exudate.  Eyes:     Extraocular Movements: Extraocular movements intact.  Cardiovascular:     Rate and Rhythm: Normal rate and regular rhythm.     Pulses: Normal pulses.     Heart sounds: Normal heart sounds.  Pulmonary:     Breath sounds: Rhonchi present.     Comments: Labored breathing Neurological:     Mental Status: She is alert and oriented to person, place, and time.      UC Treatments / Results  Labs (all labs ordered are listed, but only abnormal results are displayed) Labs Reviewed  POCT INFLUENZA A/B    EKG   Radiology No results found.  Procedures Procedures (including critical care time)  Medications Ordered in UC Medications - No data to display  Initial Impression / Assessment and Plan / UC Course  I have reviewed the triage vital signs and the nursing notes.  Pertinent labs & imaging results that were available during my care of the patient were reviewed by me and considered in my medical decision making (see chart for details).  COPD exacerbation, exposure to the flu  Vital signs are stable, O2 saturation 93% on room air, initial triage rhonchi heard to auscultation and patient is experiencing labored breathing and shortness of breath, DuoNeb and Decadron  IM given on reevaluation, lower lobes clear with coarse wheezing to the bilateral upper lobe, patient endorsing breathing  improving,  now stable for outpatient management, chest x-ray is pending will notify of results via telephone, flu test negative, initiating azithromycin , oral prednisone  and refilled will except an albuterol  inhaler, given strict ER precautions Final Clinical Impressions(s) / UC Diagnoses   Final diagnoses:  Exposure to the flu   Discharge Instructions   None    ED Prescriptions   None    PDMP not reviewed this encounter.     [1]  Social History Tobacco Use   Smoking status: Every Day    Current packs/day: 1.00    Average packs/day: 1 pack/day for 62.0 years (62.0 ttl pk-yrs)    Types: Cigarettes    Passive exposure: Current   Smokeless tobacco: Never   Tobacco comments:    Smokes 1- 1.5 PPD 01/29/2023  Vaping Use   Vaping status: Never Used  Substance Use Topics   Alcohol  use: Not Currently   Drug use: Never     Teresa Shelba SAUNDERS, NP 11/16/24 1349  "

## 2024-11-16 NOTE — Telephone Encounter (Signed)
 NOTED This is great thank you rena

## 2024-11-22 ENCOUNTER — Ambulatory Visit: Admitting: Sports Medicine

## 2024-11-22 ENCOUNTER — Encounter: Payer: Self-pay | Admitting: Sports Medicine

## 2024-11-22 DIAGNOSIS — M778 Other enthesopathies, not elsewhere classified: Secondary | ICD-10-CM | POA: Diagnosis not present

## 2024-11-22 DIAGNOSIS — M67922 Unspecified disorder of synovium and tendon, left upper arm: Secondary | ICD-10-CM

## 2024-11-22 DIAGNOSIS — G8929 Other chronic pain: Secondary | ICD-10-CM | POA: Diagnosis not present

## 2024-11-22 DIAGNOSIS — M25522 Pain in left elbow: Secondary | ICD-10-CM | POA: Diagnosis not present

## 2024-11-22 NOTE — Progress Notes (Signed)
 Patient says that until Saturday she was sore and had quite a bit of pain in the elbow. She does believe that this pain and soreness was from the first session of shockwave therapy. On Saturday, her pain seemed to improve, and she now feels about 80% improved overall. She says that she can now roll over on the elbow at night. She is here for repeat treatment today.

## 2024-11-22 NOTE — Progress Notes (Signed)
 "  Alicia [name] - 79 y.o. female MRN [id number]  Date of birth: [birth year]  Office Visit Note: Visit Date: 11/22/2024 PCP: Corwin Antu, FNP Referred by: Corwin Antu, FNP  Subjective: Chief Complaint  Patient presents with   Left Elbow - Follow-up   HPI: Alicia [name] is a pleasant 79 y.o. female who presents today for follow-up of chronic left elbow pain.  Last visit we did perform shockwave therapy for both the distal tricep insertion as well as the common flexor tendon origin, she did have some soreness for the first few days but overall feels she is about 80% improved after our first treatment. She says that she can now roll over on the elbow at night.   Pertinent ROS were reviewed with the patient and found to be negative unless otherwise specified above in HPI.   Assessment & Plan: Visit Diagnoses:  1. Chronic elbow pain, left   2. Tendinitis of left triceps   3. Tendinopathy of elbow, left    Plan: Impression is chronic left elbow pain which is multifactorial with OA, insertional triceps tendinopathy with spurring, and partial tearing of CFT with medial epicondylitis. We performed 1 treatment last week for both the tricep and the common flexor tendon origin with significant relief, at least 80% benefit.  We did repeat additional treatment trial today.  I would like to see her back next week just overall see how she is doing and then decide on additional shockwave treatments versus other treatment modalities.  Other consideration would be one-time intra-articular elbow injection if not significantly improving, although she has made excellent strides with her first trial treatment only.  Okay for topical Voltaren gel as well as her gabapentin  and Tylenol  for other painful pathologies.  She will follow-up next week.  Follow-up: 1-week   Meds & Orders: No orders of the defined types were placed in this encounter.  No orders of the defined types were placed in this  encounter.    Procedures: Procedure: ECSWT Indications:  Left elbow pain   Procedure Details Consent: Risks of procedure as well as the alternatives and risks of each were explained to the patient.  Verbal consent for procedure obtained. Time Out: Verified patient identification, verified procedure, site was marked, verified correct patient position. The area was cleaned with alcohol  swab.     The left elbow (triceps origin) was targeted for Extracorporeal shockwave therapy.    Preset: Epicondylitis / Tendinopathy Power Level: 80 mJ Frequency: 90 Hz Impulse/cycles: 1600 Head size: Regular   The left common flexor tendon origin was targeted for Extracorporeal shockwave therapy.    Preset: Epicondylitis / Tendinopathy Power Level: 90 mJ Frequency: 100 Hz Impulse/cycles: 1200 Head size: Regular   Patient tolerated procedure well without immediate complications.      Clinical History:   She reports that she has been smoking cigarettes. She has a 62 pack-year smoking history. She has been exposed to tobacco smoke. She has never used smokeless tobacco. No results for input(s): HGBA1C, LABURIC in the last 8760 hours.  Objective:    Physical Exam  Gen: Well-appearing, in no acute distress; non-toxic CV: Well-perfused. Warm.  Resp: Breathing unlabored on room air; no wheezing. Psych: Fluid speech in conversation; appropriate affect; normal thought process  Ortho Exam - Left elbow: Minimal TTP over the distal aspect of the tricep insertion and olecranon bursa without bursitis present.  No significant tenderness over the CFT today. ROM preserved.  Imaging: No results found.  Past Medical/Family/Surgical/Social  History: Medications & Allergies reviewed per EMR, new medications updated. 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 05/18/2023   Class 2 severe obesity due to excess calories with serious comorbidity and body mass index (BMI) of 37.0 to 37.9 in adult 12/28/2022   On statin therapy 12/28/2022   S/P reverse total shoulder arthroplasty, right 08/06/2022   Secondary osteoarthritis of right shoulder due to rotator cuff tear 06/16/2022   Seborrheic dermatitis 06/16/2022   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   Centrilobular emphysema (HCC) 02/13/2022   Cigarette smoker 02/06/2022   Asthmatic bronchitis , chronic (HCC) 02/05/2022   Incidental pulmonary nodule, greater than or equal to 8mm 01/21/2022   Bilateral carotid artery stenosis 12/26/2021   Iron  deficiency anemia 12/26/2021   Mixed hyperlipidemia 12/26/2021   Chronic cough 12/26/2021   Adjustment reaction with anxiety and depression 12/26/2021   Chronic venous insufficiency 02/29/2020   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/05/2019   Arthritis of shoulder 06/08/2018   GERD (gastroesophageal reflux disease) 06/08/2018   Macular degeneration 06/08/2018   Sleep apnea 06/08/2018   Past Medical History:  Diagnosis Date   Actinic keratosis    Anemia    iron  deficiency   Aneurysmal dilatation    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, will need biopsy- 05/2023   Neuropathy    bilateral feet/legs   Peripheral arterial disease    Peripheral vascular disease    Mesenteric Artery Stent Placed 2023   Pneumonia    Positive fecal occult blood test 01/19/2022   Sleep apnea    wears cpap    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   R mid forearm, [facility]   Family History  Problem Relation Age of Onset   Heart disease Mother    Cerebral aneurysm Father    Past Surgical History:  Procedure Laterality Date   APPENDECTOMY  10/2016   BICEPT TENODESIS Left 05/18/2023   Procedure: BICEPS TENODESIS;  Surgeon: Addie Cordella Hamilton, MD;  Location: [facility] OR;  Service: Orthopedics;  Laterality: Left;   BREAST BIOPSY Right 2022   CELIAC ARTERY STENT     CHOLECYSTECTOMY  10/2016   COLONOSCOPY     12/2022   ENTEROSCOPY N/A 12/17/2022   Procedure: ENTEROSCOPY;  Surgeon: Federico Rosario BROCKS, MD;  Location: [facility];  Service: Gastroenterology;  Laterality: N/A;  small bowel enteroscopy   EYE SURGERY Bilateral 2017   cataracts removed   HOT HEMOSTASIS  12/17/2022   Procedure: HOT HEMOSTASIS (ARGON PLASMA COAGULATION/BICAP);  Surgeon: Federico Rosario BROCKS, MD;  Location: [facility];  Service: Gastroenterology;;   LUMBAR LAMINECTOMY/DECOMPRESSION MICRODISCECTOMY Left 07/21/2021   Procedure: Laminectomy and Foraminotomy - L4-L5 - left;  Surgeon: Onetha Kuba, MD;  Location: [facility] OR;  Service: Neurosurgery;  Laterality: Left;  3C   LUMBAR LAMINECTOMY/DECOMPRESSION MICRODISCECTOMY Right 08/09/2023  Procedure: Sublaminar decompression - Lumbar Two-Lumbar Three - right;  Surgeon: Onetha Kuba, MD;  Location: [facility] OR;  Service: Neurosurgery;  Laterality: Right;  3C   PAROTID GLAND TUMOR EXCISION Left 2003   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   Procedure: PERIPHERAL VASCULAR INTERVENTION;  Surgeon: Sheree Penne Bruckner, MD;  Location: [facility] INVASIVE CV LAB;  Service: Cardiovascular;;  celiac   REVERSE SHOULDER ARTHROPLASTY Left 05/18/2023   Procedure: LEFT REVERSE SHOULDER ARTHROPLASTY;  Surgeon: Addie Cordella Hamilton, MD;  Location: [facility] OR;  Service: Orthopedics;  Laterality: Left;   TOTAL SHOULDER ARTHROPLASTY Right 08/06/2022   Procedure: RIGHT REVERSE SHOULDER ARTHROPLASTY;  Surgeon: Addie Cordella Hamilton, MD;  Location: [facility] OR;  Service: Orthopedics;  Laterality: Right;   VISCERAL ANGIOGRAPHY N/A 05/18/2022   Procedure: VISCERAL ANGIOGRAPHY;  Surgeon: Sheree Penne Bruckner, MD;  Location: [facility] INVASIVE CV LAB;  Service: Cardiovascular;  Laterality: N/A;   Social History   Occupational History   Not on file  Tobacco Use   Smoking status: Every Day    Current packs/day: 1.00    Average packs/day: 1 pack/day for 62.0 years (62.0 ttl pk-yrs)    Types: Cigarettes    Passive exposure: Current   Smokeless tobacco: Never   Tobacco comments:    Smokes 1- 1.5 PPD 01/29/2023  Vaping Use   Vaping status: Never Used  Substance and Sexual Activity   Alcohol  use: Not Currently   Drug use: Never   Sexual activity: Not Currently   "

## 2024-11-29 ENCOUNTER — Ambulatory Visit: Admitting: Sports Medicine

## 2024-11-30 ENCOUNTER — Other Ambulatory Visit: Payer: Self-pay | Admitting: Family

## 2025-02-12 ENCOUNTER — Inpatient Hospital Stay

## 2025-02-13 ENCOUNTER — Inpatient Hospital Stay

## 2025-02-13 ENCOUNTER — Inpatient Hospital Stay: Admitting: Oncology

## 2025-02-26 ENCOUNTER — Ambulatory Visit: Admitting: Dermatology

## 2025-04-13 ENCOUNTER — Encounter
